# Patient Record
Sex: Male | Born: 1955
Health system: Southern US, Community
[De-identification: ages and names within clinical notes are randomized; demographics above are authoritative.]

## PROBLEM LIST (undated history)

## (undated) DIAGNOSIS — J310 Chronic rhinitis: Secondary | ICD-10-CM

## (undated) DIAGNOSIS — N35919 Unspecified urethral stricture, male, unspecified site: Secondary | ICD-10-CM

## (undated) DIAGNOSIS — K219 Gastro-esophageal reflux disease without esophagitis: Secondary | ICD-10-CM

## (undated) DIAGNOSIS — E349 Endocrine disorder, unspecified: Secondary | ICD-10-CM

## (undated) DIAGNOSIS — R51 Headache: Secondary | ICD-10-CM

## (undated) DIAGNOSIS — N400 Enlarged prostate without lower urinary tract symptoms: Secondary | ICD-10-CM

## (undated) DIAGNOSIS — R0602 Shortness of breath: Secondary | ICD-10-CM

## (undated) DIAGNOSIS — I10 Essential (primary) hypertension: Secondary | ICD-10-CM

## (undated) DIAGNOSIS — N529 Male erectile dysfunction, unspecified: Secondary | ICD-10-CM

## (undated) DIAGNOSIS — T7840XA Allergy, unspecified, initial encounter: Secondary | ICD-10-CM

## (undated) DIAGNOSIS — G4733 Obstructive sleep apnea (adult) (pediatric): Secondary | ICD-10-CM

## (undated) DIAGNOSIS — J45909 Unspecified asthma, uncomplicated: Secondary | ICD-10-CM

## (undated) DIAGNOSIS — G4730 Sleep apnea, unspecified: Secondary | ICD-10-CM

## (undated) DIAGNOSIS — G709 Myoneural disorder, unspecified: Secondary | ICD-10-CM

## (undated) DIAGNOSIS — E785 Hyperlipidemia, unspecified: Secondary | ICD-10-CM

## (undated) DIAGNOSIS — M1990 Unspecified osteoarthritis, unspecified site: Secondary | ICD-10-CM

## (undated) DIAGNOSIS — Q318 Other congenital malformations of larynx: Secondary | ICD-10-CM

## (undated) HISTORY — PX: COLONOSCOPY: SHX174

## (undated) HISTORY — DX: Chronic rhinitis: J31.0

## (undated) HISTORY — DX: Allergy, unspecified, initial encounter: T78.40XA

## (undated) HISTORY — PX: CARPAL TUNNEL RELEASE: SHX101

## (undated) HISTORY — DX: Shortness of breath: R06.02

## (undated) HISTORY — DX: Hyperlipidemia, unspecified: E78.5

## (undated) HISTORY — DX: Endocrine disorder, unspecified: E34.9

## (undated) HISTORY — PX: POLYPECTOMY: SHX149

## (undated) HISTORY — DX: Sleep apnea, unspecified: G47.30

## (undated) HISTORY — PX: UPPER GASTROINTESTINAL ENDOSCOPY: SHX188

## (undated) HISTORY — DX: Male erectile dysfunction, unspecified: N52.9

## (undated) HISTORY — PX: BACK SURGERY: SHX140

## (undated) HISTORY — DX: Myoneural disorder, unspecified: G70.9

## (undated) HISTORY — DX: Essential (primary) hypertension: I10

---

## 1999-07-12 ENCOUNTER — Emergency Department (HOSPITAL_COMMUNITY): Admission: EM | Admit: 1999-07-12 | Discharge: 1999-07-12 | Payer: Self-pay | Admitting: Emergency Medicine

## 2001-12-25 ENCOUNTER — Encounter: Payer: Self-pay | Admitting: Family Medicine

## 2001-12-25 ENCOUNTER — Ambulatory Visit (HOSPITAL_COMMUNITY): Admission: RE | Admit: 2001-12-25 | Discharge: 2001-12-25 | Payer: Self-pay | Admitting: Family Medicine

## 2002-01-01 ENCOUNTER — Ambulatory Visit (HOSPITAL_COMMUNITY): Admission: RE | Admit: 2002-01-01 | Discharge: 2002-01-01 | Payer: Self-pay | Admitting: Family Medicine

## 2002-01-01 ENCOUNTER — Encounter: Payer: Self-pay | Admitting: Family Medicine

## 2002-07-26 ENCOUNTER — Ambulatory Visit (HOSPITAL_COMMUNITY): Admission: RE | Admit: 2002-07-26 | Discharge: 2002-07-26 | Payer: Self-pay | Admitting: Family Medicine

## 2002-07-26 ENCOUNTER — Encounter: Payer: Self-pay | Admitting: Family Medicine

## 2002-08-28 ENCOUNTER — Ambulatory Visit (HOSPITAL_BASED_OUTPATIENT_CLINIC_OR_DEPARTMENT_OTHER): Admission: RE | Admit: 2002-08-28 | Discharge: 2002-08-28 | Payer: Self-pay | Admitting: Orthopedic Surgery

## 2002-08-28 HISTORY — PX: SHOULDER ARTHROSCOPY W/ ACROMIAL REPAIR: SUR94

## 2003-03-01 ENCOUNTER — Encounter: Payer: Self-pay | Admitting: Emergency Medicine

## 2003-03-01 ENCOUNTER — Emergency Department (HOSPITAL_COMMUNITY): Admission: EM | Admit: 2003-03-01 | Discharge: 2003-03-01 | Payer: Self-pay | Admitting: Emergency Medicine

## 2006-10-07 ENCOUNTER — Emergency Department (HOSPITAL_COMMUNITY): Admission: EM | Admit: 2006-10-07 | Discharge: 2006-10-07 | Payer: Self-pay | Admitting: Emergency Medicine

## 2007-06-28 LAB — HM COLONOSCOPY

## 2008-09-23 ENCOUNTER — Encounter: Admission: RE | Admit: 2008-09-23 | Discharge: 2008-09-23 | Payer: Self-pay | Admitting: Sports Medicine

## 2009-01-27 ENCOUNTER — Encounter: Payer: Self-pay | Admitting: Pulmonary Disease

## 2009-01-27 ENCOUNTER — Ambulatory Visit: Payer: Self-pay | Admitting: Pulmonary Disease

## 2009-01-27 DIAGNOSIS — E785 Hyperlipidemia, unspecified: Secondary | ICD-10-CM | POA: Insufficient documentation

## 2009-01-27 DIAGNOSIS — K219 Gastro-esophageal reflux disease without esophagitis: Secondary | ICD-10-CM | POA: Insufficient documentation

## 2009-01-27 DIAGNOSIS — J310 Chronic rhinitis: Secondary | ICD-10-CM | POA: Insufficient documentation

## 2009-01-27 DIAGNOSIS — R0683 Snoring: Secondary | ICD-10-CM | POA: Insufficient documentation

## 2009-01-27 DIAGNOSIS — I10 Essential (primary) hypertension: Secondary | ICD-10-CM | POA: Insufficient documentation

## 2009-01-27 DIAGNOSIS — J45909 Unspecified asthma, uncomplicated: Secondary | ICD-10-CM | POA: Insufficient documentation

## 2009-01-27 HISTORY — DX: Hyperlipidemia, unspecified: E78.5

## 2009-01-27 HISTORY — DX: Chronic rhinitis: J31.0

## 2009-01-27 HISTORY — DX: Essential (primary) hypertension: I10

## 2009-01-28 ENCOUNTER — Encounter: Payer: Self-pay | Admitting: Pulmonary Disease

## 2009-02-19 ENCOUNTER — Encounter: Payer: Self-pay | Admitting: Pulmonary Disease

## 2009-02-19 ENCOUNTER — Ambulatory Visit: Payer: Self-pay | Admitting: Pulmonary Disease

## 2009-02-19 DIAGNOSIS — J383 Other diseases of vocal cords: Secondary | ICD-10-CM | POA: Insufficient documentation

## 2009-02-20 ENCOUNTER — Encounter: Payer: Self-pay | Admitting: Pulmonary Disease

## 2009-08-25 ENCOUNTER — Ambulatory Visit: Payer: Self-pay | Admitting: Internal Medicine

## 2009-08-25 DIAGNOSIS — R519 Headache, unspecified: Secondary | ICD-10-CM | POA: Insufficient documentation

## 2009-08-25 DIAGNOSIS — R51 Headache: Secondary | ICD-10-CM | POA: Insufficient documentation

## 2009-08-26 ENCOUNTER — Encounter: Payer: Self-pay | Admitting: Internal Medicine

## 2009-08-27 ENCOUNTER — Telehealth: Payer: Self-pay | Admitting: Internal Medicine

## 2009-09-03 ENCOUNTER — Encounter: Payer: Self-pay | Admitting: Internal Medicine

## 2009-11-11 ENCOUNTER — Ambulatory Visit: Payer: Self-pay | Admitting: Internal Medicine

## 2010-01-05 ENCOUNTER — Ambulatory Visit: Payer: Self-pay | Admitting: Internal Medicine

## 2010-01-05 DIAGNOSIS — IMO0002 Reserved for concepts with insufficient information to code with codable children: Secondary | ICD-10-CM | POA: Insufficient documentation

## 2010-01-06 ENCOUNTER — Encounter: Payer: Self-pay | Admitting: Internal Medicine

## 2010-01-06 ENCOUNTER — Telehealth: Payer: Self-pay | Admitting: Internal Medicine

## 2010-01-26 ENCOUNTER — Encounter: Payer: Self-pay | Admitting: Internal Medicine

## 2010-02-05 ENCOUNTER — Emergency Department (HOSPITAL_COMMUNITY): Admission: EM | Admit: 2010-02-05 | Discharge: 2010-02-05 | Payer: Self-pay | Admitting: Emergency Medicine

## 2010-04-27 ENCOUNTER — Ambulatory Visit: Payer: Self-pay | Admitting: Internal Medicine

## 2010-04-27 DIAGNOSIS — R358 Other polyuria: Secondary | ICD-10-CM

## 2010-04-27 DIAGNOSIS — R319 Hematuria, unspecified: Secondary | ICD-10-CM | POA: Insufficient documentation

## 2010-04-27 DIAGNOSIS — R3589 Other polyuria: Secondary | ICD-10-CM | POA: Insufficient documentation

## 2010-04-27 DIAGNOSIS — N342 Other urethritis: Secondary | ICD-10-CM | POA: Insufficient documentation

## 2010-05-28 ENCOUNTER — Telehealth: Payer: Self-pay | Admitting: Internal Medicine

## 2010-05-31 ENCOUNTER — Ambulatory Visit: Payer: Self-pay | Admitting: Internal Medicine

## 2010-05-31 LAB — CONVERTED CEMR LAB: PSA: 2.91 ng/mL (ref 0.10–4.00)

## 2010-06-03 ENCOUNTER — Telehealth: Payer: Self-pay | Admitting: Internal Medicine

## 2010-06-16 ENCOUNTER — Ambulatory Visit: Payer: Self-pay | Admitting: Internal Medicine

## 2010-07-15 ENCOUNTER — Telehealth: Payer: Self-pay | Admitting: Internal Medicine

## 2010-07-16 ENCOUNTER — Ambulatory Visit
Admission: RE | Admit: 2010-07-16 | Discharge: 2010-07-16 | Payer: Self-pay | Source: Home / Self Care | Attending: Internal Medicine | Admitting: Internal Medicine

## 2010-07-16 DIAGNOSIS — K1120 Sialoadenitis, unspecified: Secondary | ICD-10-CM | POA: Insufficient documentation

## 2010-07-27 NOTE — Medication Information (Signed)
Summary: Prior Authorization Request for Cialis-Duplicate  Prior Authorization Request for Cialis-Duplicate   Imported By: Maryln Gottron 09/09/2009 10:43:17  _____________________________________________________________________  External Attachment:    Type:   Image     Comment:   External Document

## 2010-07-27 NOTE — Progress Notes (Signed)
Summary: FYI  Phone Note Call from Patient   Caller: Patient Call For: Gordy Savers  MD Summary of Call: [phone] Pt is concerned that the swelling in his penis is no better, and still painful.  Ice is not helping. Initial call taken by: Lynann Beaver CMA,  January 06, 2010 9:55 AM  Follow-up for Phone Call        called and left message Follow-up by: Gordy Savers  MD,  January 06, 2010 12:25 PM

## 2010-07-27 NOTE — Miscellaneous (Signed)
Summary: Pulmonary function test   Pulmonary Function Test Date: 02/19/2009 Height (in.): 67 Gender: Male  Pre-Spirometry FVC    Value: 3.64 L/min   Pred: 4.35 L/min     % Pred: 84 % FEV1    Value: 2.53 L     Pred: 3.21 L     % Pred: 79 % FEV1/FVC  Value: 69 %     Pred: 73 %     % Pred: . % FEF 25-75  Value: 1.49 L/min   Pred: 3.28 L/min     % Pred: 46 %  Post-Spirometry FVC    Value: 4.13 L/min   Pred: 4.35 L/min     % Pred: 95 % FEV1    Value: 2.90 L     Pred: 3.21 L     % Pred: 90 % FEV1/FVC  Value: 70 %     Pred: 73 %     % Pred: . % FEF 25-75  Value: 1.87 L/min   Pred: 3.28 L/min     % Pred: 57 %  Lung Volumes TLC    Value: 5.62 L   % Pred: 93 % RV    Value: 1.98 L   % Pred: 97 % DLCO    Value: 27.4 %   % Pred: 99 % DLCO/VA  Value: 5.67 %   % Pred: 144 %  Comments: Borderline obstruction.  Positive bronchodilator response.  Normal lung volumes.  Normal diffusion. Clinical Lists Changes  Observations: Added new observation of PFT COMMENTS: Borderline obstruction.  Positive bronchodilator response.  Normal lung volumes.  Normal diffusion. (02/19/2009 8:54) Added new observation of DLCO/VA%EXP: 144 % (02/19/2009 8:54) Added new observation of DLCO/VA: 5.67 % (02/19/2009 8:54) Added new observation of DLCO % EXPEC: 99 % (02/19/2009 8:54) Added new observation of DLCO: 27.4 % (02/19/2009 8:54) Added new observation of RV % EXPECT: 97 % (02/19/2009 8:54) Added new observation of RV: 1.98 L (02/19/2009 8:54) Added new observation of TLC % EXPECT: 93 % (02/19/2009 8:54) Added new observation of TLC: 5.62 L (02/19/2009 8:54) Added new observation of FEF2575%EXPS: 57 % (02/19/2009 8:54) Added new observation of PSTFEF25/75P: 3.28  (02/19/2009 8:54) Added new observation of PSTFEF25/75%: 1.87 L/min (02/19/2009 8:54) Added new observation of PSTFEV1/FCV%: . % (02/19/2009 8:54) Added new observation of FEV1FVCPRDPS: 73 % (02/19/2009 8:54) Added new observation of PSTFEV1/FVC: 70  % (02/19/2009 8:54) Added new observation of POSTFEV1%PRD: 90 % (02/19/2009 8:54) Added new observation of FEV1PRDPST: 3.21 L (02/19/2009 8:54) Added new observation of POST FEV1: 2.90 L/min (02/19/2009 8:54) Added new observation of POST FVC%EXP: 95 % (02/19/2009 8:54) Added new observation of FVCPRDPST: 4.35 L/min (02/19/2009 8:54) Added new observation of POST FVC: 4.13 L (02/19/2009 8:54) Added new observation of FEF % EXPEC: 46 % (02/19/2009 8:54) Added new observation of FEF25-75%PRE: 3.28 L/min (02/19/2009 8:54) Added new observation of FEF 25-75%: 1.49 L/min (02/19/2009 8:54) Added new observation of FEV1/FVC%EXP: . % (02/19/2009 8:54) Added new observation of FEV1/FVC PRE: 73 % (02/19/2009 8:54) Added new observation of FEV1/FVC: 69 % (02/19/2009 8:54) Added new observation of FEV1 % EXP: 79 % (02/19/2009 8:54) Added new observation of FEV1 PREDICT: 3.21 L (02/19/2009 8:54) Added new observation of FEV1: 2.53 L (02/19/2009 8:54) Added new observation of FVC % EXPECT: 84 % (02/19/2009 8:54) Added new observation of FVC PREDICT: 4.35 L (02/19/2009 8:54) Added new observation of FVC: 3.64 L (02/19/2009 8:54) Added new observation of PFT DATE: 02/19/2009  (02/19/2009 8:54)

## 2010-07-27 NOTE — Progress Notes (Signed)
Summary: Pt called with the psa #'s he had drawn  Phone Note Call from Patient Call back at Work Phone [phone]   Caller: Patient Summary of Call: Pt is trying to re-apply  for life insurane. Pt called with psa # 4.49 and 3 psa was 23.  This is why  he was denied life insurance. Pt would like to sch psa test.    Initial call taken by: Lucy Antigua,  May 28, 2010 10:40 AM  Follow-up for Phone Call        please advise - ok to schedule just lab ? rov pedning lab results? Follow-up by: Duard Brady LPN,  May 28, 2010 11:02 AM  Additional Follow-up for Phone Call Additional follow up Details #1::        OK to do just PSA    Additional Follow-up for Phone Call Additional follow up Details #2::    spoke with pt - lab scheduled for monday. KIK Follow-up by: Duard Brady LPN,  May 28, 2010 12:54 PM

## 2010-07-27 NOTE — Assessment & Plan Note (Signed)
Summary: to discuss issues/njr   Vital Signs:  Patient profile:   55 year old male Weight:      208 pounds Temp:     98.2 degrees F oral BP sitting:   110 / 70  (right arm) Cuff size:   regular  Vitals Entered By: Duard Brady LPN (Nov 11, 2009 10:04 AM) CC: has questions about injections to back , and reoccurring hemorrhoid problem Is Patient Diabetic? No   Primary Care Provider:  [facility]  CC:  has questions about injections to back  and and reoccurring hemorrhoid problem.  History of Present Illness: 55 year old patient who is seen today for follow-up.  He has a history of mild asthma, hypertension, and dyslipidemia.  He is also followed at [facility] hospital system.  He has been receiving epidurals for back pain.  He also has symptomatic external hemorrhoids.  He will be seen in follow-up at the [place] next month and will discuss referral for this at that time.  He may be calling this  office for a local referral.  Preventive Screening-Counseling & Management  Alcohol-Tobacco     Smoking Status: quit  Allergies: 1)  ! Jonne Ply  Past History:  Past Medical History: Reviewed history from 08/25/2009 and no changes required. Asthma with borderline obstruction      - PFT 02/19/09 FEV1 2.90(90%), FVC 4.13(95%), FEV1% 70, TLC 5.62(93%), DLCO 99%, +BD Rhinitis Obstructive sleep apnea [facility] 2009)      - Not on therapy Hypertension Hyperlipidemia GERD Low back pain Headache (basilar migraines) ED testosterone deficiency  Past Surgical History: Reviewed history from 08/25/2009 and no changes required. Left shoulder arthroscopy (twice) colonoscopy  2009 [facility])  Review of Systems       The patient complains of weight loss.  The patient denies anorexia, fever, weight gain, vision loss, decreased hearing, hoarseness, chest pain, syncope, dyspnea on exertion, peripheral edema, prolonged cough, headaches, hemoptysis, abdominal pain, melena, hematochezia,  severe indigestion/heartburn, hematuria, incontinence, genital sores, muscle weakness, suspicious skin lesions, transient blindness, difficulty walking, depression, unusual weight change, abnormal bleeding, enlarged lymph nodes, angioedema, breast masses, and testicular masses.    Physical Exam  General:  overweight-appearing.  110/70overweight-appearing.   Head:  Normocephalic and atraumatic without obvious abnormalities. No apparent alopecia or balding. Mouth:  Oral mucosa and oropharynx without lesions or exudates.  Teeth in good repair. Neck:  No deformities, masses, or tenderness noted. Lungs:  Normal respiratory effort, chest expands symmetrically. Lungs are clear to auscultation, no crackles or wheezes. Heart:  Normal rate and regular rhythm. S1 and S2 normal without gallop, murmur, click, rub or other extra sounds. Abdomen:  Bowel sounds positive,abdomen soft and non-tender without masses, organomegaly or hernias noted.   Impression & Recommendations:  Problem # 1:  GERD (ICD-530.81)  His updated medication list for this problem includes:    Omeprazole 20 Mg Tbec (Omeprazole) .Marland Kitchen... 1 by mouth daily  His updated medication list for this problem includes:    Omeprazole 20 Mg Tbec (Omeprazole) .Marland Kitchen... 1 by mouth daily  Problem # 2:  ASTHMA (ICD-493.90)  His updated medication list for this problem includes:    Qvar 80 Mcg/act Aers (Beclomethasone dipropionate) .Marland Kitchen..Marland Kitchen Two puffs two times a day    Proair Hfa 108 (90 Base) Mcg/act Aers (Albuterol sulfate) .Marland Kitchen..Marland Kitchen Two puffs qid as needed  His updated medication list for this problem includes:    Qvar 80 Mcg/act Aers (Beclomethasone dipropionate) .Marland Kitchen..Marland Kitchen Two puffs two times a day  Proair Hfa 108 (90 Base) Mcg/act Aers (Albuterol sulfate) .Marland Kitchen..Marland Kitchen Two puffs qid as needed  Problem # 3:  HYPERTENSION (ICD-401.9)  His updated medication list for this problem includes:    Atenolol 25 Mg Tabs (Atenolol) .Marland Kitchen... 1 by mouth daily    His updated  medication list for this problem includes:    Atenolol 25 Mg Tabs (Atenolol) .Marland Kitchen... 1 by mouth daily  Orders: Prescription Created Electronically [phone])  Problem # 4:  HYPERLIPIDEMIA (ICD-272.4)  His updated medication list for this problem includes:    Simvastatin 80 Mg Tabs (Simvastatin) .Marland Kitchen... 1 by mouth daily    His updated medication list for this problem includes:    Simvastatin 80 Mg Tabs (Simvastatin) .Marland Kitchen... 1 by mouth daily  Orders: Prescription Created Electronically [phone])  Complete Medication List: 1)  Qvar 80 Mcg/act Aers (Beclomethasone dipropionate) .... Two puffs two times a day 2)  Proair Hfa 108 (90 Base) Mcg/act Aers (Albuterol sulfate) .... Two puffs qid as needed 3)  Nasonex 50 Mcg/act Susp (Mometasone furoate) .... Two sprays once daily 4)  Omeprazole 20 Mg Tbec (Omeprazole) .Marland Kitchen.. 1 by mouth daily 5)  Simvastatin 80 Mg Tabs (Simvastatin) .Marland Kitchen.. 1 by mouth daily 6)  Atenolol 25 Mg Tabs (Atenolol) .Marland Kitchen.. 1 by mouth daily 7)  Diclofenac Sodium 75 Mg Tbec (Diclofenac sodium) .Marland Kitchen.. 1 by mouth two times a day 8)  Levitra 20 Mg Tabs (Vardenafil hcl) .... Uad  Patient Instructions: 1)  Please schedule a follow-up appointment in 6 months. 2)  Limit your Sodium (Salt) to less than 2 grams a day(slightly less than 1/2 a teaspoon) to prevent fluid retention, swelling, or worsening of symptoms. 3)  Avoid foods high in acid (tomatoes, citrus juices, spicy foods). Avoid eating within two hours of lying down or before exercising. Do not over eat; try smaller more frequent meals. Elevate head of bed twelve inches when sleeping. 4)  It is important that you exercise regularly at least 20 minutes 5 times a week. If you develop chest pain, have severe difficulty breathing, or feel very tired , stop exercising immediately and seek medical attention. 5)  You need to lose weight. Consider a lower calorie diet and regular exercise.  Prescriptions: DICLOFENAC SODIUM 75 MG TBEC (DICLOFENAC  SODIUM) 1 by mouth two times a day  #180 x 6   Entered and Authorized by:   Gordy Savers  MD   Signed by:   Gordy Savers  MD on 11/11/2009   Method used:   [facility] to        [facility]  [address] [phone][facility] (retail)       [address]       [place], [place]  [address]       Ph: [phone] or [phone]       Fax: [phone]   RxID:   [id number] ATENOLOL 25 MG TABS (ATENOLOL) 1 by mouth daily  #90 x 6   Entered and Authorized by:   Gordy Savers  MD   Signed by:   Gordy Savers  MD on 11/11/2009   Method used:   [facility]        [facility]  [address] [phone]* (retail)       [address]       [place], [place]  [address]       Ph: [phone] or [phone]  Fax: [phone]   RxID:   [id number] SIMVASTATIN 80 MG TABS (SIMVASTATIN) 1 by mouth daily  #90 x 6   Entered and Authorized by:   Gordy Savers  MD   Signed by:   Gordy Savers  MD on 11/11/2009   Method used:   [facility]  [address] [phone]* (retail)       [address]       [place], [place]  [address]       Ph: [phone] or [phone]       Fax: [phone]   RxID:   [id number]

## 2010-07-27 NOTE — Assessment & Plan Note (Signed)
Summary: FREQUENCY AGAIN/PS   Vital Signs:  Patient profile:   55 year old male Height:      66.5 inches Weight:      210 pounds BMI:     33.51 Temp:     98.8 degrees F oral BP sitting:   118 / 80  (left arm) Cuff size:   regular  Vitals Entered By: Kern Reap CMA Duncan Dull) (April 27, 2010 8:24 AM) L. heCC: frequent urination   [facility] Care Provider:  [facility]  CC:  frequent urination.  History of Present Illness:  55 year old patient noticed some gross hematuria after sexual intercourse with his wife 4 days ago.  The gross hematuria resolved the  next morning.  He has had persistent frequency and urgency, but no urethral discharge.  He denies any fever.  He does treated hypertension and dyslipidemia.  He is also followed the [facility].  He has a history of asthma, which has been stable.  Allergies: 1)  ! Asa  Review of Systems  The patient denies anorexia, fever, weight loss, weight gain, vision loss, decreased hearing, hoarseness, chest pain, syncope, dyspnea on exertion, peripheral edema, prolonged cough, headaches, hemoptysis, abdominal pain, melena, hematochezia, severe indigestion/heartburn, hematuria, incontinence, genital sores, muscle weakness, suspicious skin lesions, transient blindness, difficulty walking, depression, unusual weight change, abnormal bleeding, enlarged lymph nodes, angioedema, breast masses, and testicular masses.    Physical Exam  General:  Well-developed,well-nourished,in no acute distress; alert,appropriate and cooperative throughout examination Head:  Normocephalic and atraumatic without obvious abnormalities. No apparent alopecia or balding. Eyes:  No corneal or conjunctival inflammation noted. EOMI. Perrla. Funduscopic exam benign, without hemorrhages, exudates or papilledema. Vision grossly normal. Mouth:  Oral mucosa and oropharynx without lesions or exudates.  Teeth in good repair. Neck:  No deformities, masses, or tenderness  noted. Lungs:  Normal respiratory effort, chest expands symmetrically. Lungs are clear to auscultation, no crackles or wheezes. Heart:  Normal rate and regular rhythm. S1 and S2 normal without gallop, murmur, click, rub or other extra sounds. Genitalia:  Testes bilaterally descended without nodularity, tenderness or masses. No scrotal masses or lesions. No penis lesions or urethral discharge.   Impression & Recommendations:  Problem # 1:  UNSPECIFIED URETHRITIS (ICD-597.80)  Problem # 2:  HYPERTENSION (ICD-401.9)  His updated medication list for this problem includes:    Atenolol 25 Mg Tabs (Atenolol) .Marland Kitchen... 1 by mouth daily  His updated medication list for this problem includes:    Atenolol 25 Mg Tabs (Atenolol) .Marland Kitchen... 1 by mouth daily  Complete Medication List: 1)  Qvar 80 Mcg/act Aers (Beclomethasone dipropionate) .... Two puffs two times a day 2)  Proair Hfa 108 (90 Base) Mcg/act Aers (Albuterol sulfate) .... Two puffs qid as needed 3)  Nasonex 50 Mcg/act Susp (Mometasone furoate) .... Two sprays once daily 4)  Omeprazole 20 Mg Tbec (Omeprazole) .Marland Kitchen.. 1 by mouth daily 5)  Simvastatin 80 Mg Tabs (Simvastatin) .Marland Kitchen.. 1 by mouth daily 6)  Atenolol 25 Mg Tabs (Atenolol) .Marland Kitchen.. 1 by mouth daily 7)  Diclofenac Sodium 75 Mg Tbec (Diclofenac sodium) .Marland Kitchen.. 1 by mouth two times a day 8)  Levitra 20 Mg Tabs (Vardenafil hcl) .... Uad 9)  Ciprofloxacin Hcl 500 Mg Tabs (Ciprofloxacin hcl) .... One twice daily  Patient Instructions: 1)  Please schedule a follow-up appointment in 6 months. 2)  Limit your Sodium (Salt). 3)  It is important that you exercise regularly at least 20 minutes 5 times a week. If you develop chest  pain, have severe difficulty breathing, or feel very tired , stop exercising immediately and seek medical attention. 4)  You need to lose weight. Consider a lower calorie diet and regular exercise.  Prescriptions: CIPROFLOXACIN HCL 500 MG TABS (CIPROFLOXACIN HCL) one twice daily  #14  x 0   Entered and Authorized by:   Gordy Savers  MD   Signed by:   Gordy Savers  MD on 04/27/2010   Method used:   Print then Give to Patient   RxID:   [id number]    Orders Added: 1)  Est. Patient Level III [95621]  Appended Document: Orders Update    Clinical Lists Changes  Problems: Added new problem of POLYURIA (HYQ-657.84) Added new problem of HEMATURIA UNSPECIFIED (ICD-599.70) Orders: Added new Service order of UA Dipstick w/o Micro (manual) (69629) - Signed Observations: Added new observation of PH URINE: 6.5  (04/27/2010 14:15) Added new observation of SPEC GR URIN: 1.010  (04/27/2010 14:15) Added new observation of APPEARANCE U: Hazy  (04/27/2010 14:15) Added new observation of UA COLOR: yellow  (04/27/2010 14:15) Added new observation of NITRITE URN: negative  (04/27/2010 14:15) Added new observation of UROBILINOGEN: 0.2  (04/27/2010 14:15) Added new observation of PROTEIN, URN: negative  (04/27/2010 14:15) Added new observation of KETONES URN: negative  (04/27/2010 14:15) Added new observation of BILIRUBIN UR: negative  (04/27/2010 14:15) Added new observation of GLUCOSE, URN: negative  (04/27/2010 14:15) Added new observation of BLOOD UR DIP: small  (04/27/2010 14:15) Added new observation of WBC DIPSTK U: small  (04/27/2010 14:15) Added new observation of COMMENTS: Kern Reap CMA Duncan Dull)  April 27, 2010 2:16 PM   (04/27/2010 14:15)      Laboratory Results   Urine Tests  Date/Time Received: April 27, 2010   Routine Urinalysis   Color: yellow Appearance: Hazy Glucose: negative   (Normal Range: Negative) Bilirubin: negative   (Normal Range: Negative) Ketone: negative   (Normal Range: Negative) Spec. Gravity: 1.010   (Normal Range: 1.003-1.035) Blood: small   (Normal Range: Negative) pH: 6.5   (Normal Range: 5.0-8.0) Protein: negative   (Normal Range: Negative) Urobilinogen: 0.2   (Normal Range: 0-1) Nitrite: negative    (Normal Range: Negative) Leukocyte Esterace: small   (Normal Range: Negative)    Comments: Kern Reap CMA (AAMA)  April 27, 2010 2:16 PM

## 2010-07-27 NOTE — Miscellaneous (Signed)
Summary: Orders Update  Clinical Lists Changes  Orders: Added new Referral order of Urology Referral (Urology) - Signed 

## 2010-07-27 NOTE — Assessment & Plan Note (Signed)
Summary: injury to penis/dm   Vital Signs:  Patient profile:   55 year old male Weight:      205 pounds Temp:     98.3 degrees F oral BP sitting:   112 / 70  (right arm) Cuff size:   regular  Vitals Entered By: Duard Brady LPN (January 05, 2010 9:11 AM) CC: c/o injury to groin area - swelling Is Patient Diabetic? No   Primary Care Provider:  [facility]  CC:  c/o injury to groin area - swelling.  History of Present Illness: 55 year old patient who is seen today after trauma earlier this morning to the groin area.  He was moving a table top when he tripped over his dogs, sustaining trauma to the groin region.  He has had  considerable penile edema and bruising.  He was able to void in the office without difficulty and had no dysuria or hematuria.  Allergies: 1)  ! Jonne Ply  Past History:  Past Medical History: Reviewed history from 08/25/2009 and no changes required. Asthma with borderline obstruction      - PFT 02/19/09 FEV1 2.90(90%), FVC 4.13(95%), FEV1% 70, TLC 5.62(93%), DLCO 99%, +BD Rhinitis Obstructive sleep apnea [facility] 2009)      - Not on therapy Hypertension Hyperlipidemia GERD Low back pain Headache (basilar migraines) ED testosterone deficiency  Review of Systems  The patient denies anorexia, fever, weight loss, weight gain, vision loss, decreased hearing, hoarseness, chest pain, syncope, dyspnea on exertion, peripheral edema, prolonged cough, headaches, hemoptysis, abdominal pain, melena, hematochezia, severe indigestion/heartburn, hematuria, incontinence, genital sores, muscle weakness, suspicious skin lesions, transient blindness, difficulty walking, depression, unusual weight change, abnormal bleeding, enlarged lymph nodes, angioedema, breast masses, and testicular masses.    Physical Exam  General:  comfortable no acute distress.  Blood pressure stable Abdomen:  Bowel sounds positive,abdomen soft and non-tender without masses,  organomegaly or hernias noted. Genitalia:  considerable penile edema with some early bruising slight tenderness over the lower abdomen near the base of the penis   Impression & Recommendations:  Problem # 1:  CONTUSION OF GENITAL ORGANS (ICD-922.4)  Complete Medication List: 1)  Qvar 80 Mcg/act Aers (Beclomethasone dipropionate) .... Two puffs two times a day 2)  Proair Hfa 108 (90 Base) Mcg/act Aers (Albuterol sulfate) .... Two puffs qid as needed 3)  Nasonex 50 Mcg/act Susp (Mometasone furoate) .... Two sprays once daily 4)  Omeprazole 20 Mg Tbec (Omeprazole) .Marland Kitchen.. 1 by mouth daily 5)  Simvastatin 80 Mg Tabs (Simvastatin) .Marland Kitchen.. 1 by mouth daily 6)  Atenolol 25 Mg Tabs (Atenolol) .Marland Kitchen.. 1 by mouth daily 7)  Diclofenac Sodium 75 Mg Tbec (Diclofenac sodium) .Marland Kitchen.. 1 by mouth two times a day 8)  Levitra 20 Mg Tabs (Vardenafil hcl) .... Uad  Patient Instructions: 1)  apply ice to the groin region for the next 24 hours 2)  Take 400-600mg  of Ibuprofen (Advil, Motrin) with food every 4-6 hours as needed for relief of pain or comfort of fever. 3)  call immediately if there is any voiding difficulties

## 2010-07-27 NOTE — Letter (Signed)
Summary: [facility]   Imported By: Maryln Gottron 02/01/2010 13:37:58  _____________________________________________________________________  External Attachment:    Type:   Image     Comment:   External Document

## 2010-07-27 NOTE — Progress Notes (Signed)
Summary: Cialis denied  Phone Note Other Incoming   Summary of Call: Cialis denied- try Levitra first Initial call taken by: Raechel Ache, RN,  August 27, 2009 11:31 AM    New/Updated Medications: LEVITRA 20 MG TABS (VARDENAFIL HCL) UAD Prescriptions: LEVITRA 20 MG TABS (VARDENAFIL HCL) UAD  #6 x 0   Entered by:   Raechel Ache, RN   Authorized by:   Gordy Savers  MD   Signed by:   Raechel Ache, RN on 08/27/2009   Method used:   [facility]  [address] [phone][facility] ([facility]       [address]       [place], [place]  [id number]       Ph: [phone] or [phone]       Fax: [phone]   RxID:   [id number]

## 2010-07-27 NOTE — Progress Notes (Signed)
  Phone Note Call from Patient Call back at Work Phone [phone]   Caller: Patient Call For: Gordy Savers  MD Summary of Call: Needs lab results. Initial call taken by: Lynann Beaver CMA AAMA,  June 03, 2010 12:25 PM  Follow-up for Phone Call        PSA normal Follow-up by: Gordy Savers  MD,  June 03, 2010 12:57 PM  Additional Follow-up for Phone Call Additional follow up Details #1::        given pt results. Additional Follow-up by: Lynann Beaver CMA AAMA,  June 03, 2010 3:26 PM

## 2010-07-27 NOTE — Assessment & Plan Note (Signed)
Summary: F/U with PFT/LC   Primary Provider/Referring Provider:  [facility]  CC:  follow-up with PFT's.    No changes..  History of Present Illness: I saw Douglas Carpenter for follow up of his wheezing, GERD, Rhinitis, and OSA.  His wheeze has improved with inhaler therapy, and augmentation in his sinus regimen.  He feels that proair has helped his breathing.  He has also been sleeping better since his wheezing has improved.  He did not realize how bad he was feeling until he started to improve.  PFT from February 19, 2009 showed borderline obstruction with positive bronchodilator response.  Current Medications (verified): 1)  Omeprazole 20 Mg Tbec (Omeprazole) .Marland Kitchen.. 1 By Mouth Daily 2)  Simvastatin 80 Mg Tabs (Simvastatin) .Marland Kitchen.. 1 By Mouth Daily 3)  Atenolol 25 Mg Tabs (Atenolol) .Marland Kitchen.. 1 By Mouth Daily 4)  Diclofenac Sodium 75 Mg Tbec (Diclofenac Sodium) .Marland Kitchen.. 1 By Mouth Two Times A Day 5)  Nasonex 50 Mcg/act Susp (Mometasone Furoate) .... Two Sprays Once Daily 6)  Proair Hfa 108 (90 Base) Mcg/act Aers (Albuterol Sulfate) .... Two Puffs Qid As Needed  Allergies (verified): 1)  ! Jonne Ply  Past History:  Family History: Last updated: 01/27/2009 Heart disease--MGF. MGM Family History Colon Cancer--MGF Family History Lung Cancer--mother, MGF Family History Prostate Cancer--MGF  Social History: Last updated: 01/27/2009 Married Retired Insurance underwriter Former smoker  Past Medical History: Asthma with borderline obstruction      - PFT 02/19/09 FEV1 2.90(90%), FVC 4.13(95%), FEV1% 70, TLC 5.62(93%), DLCO 99%, +BD Rhinitis Obstructive sleep apnea      - Not on therapy Hypertension Hyperlipidemia GERD Low back pain  Past Surgical History: Reviewed history from 01/27/2009 and no changes required. Left shoulder arthroscopy  Family History: Reviewed history from 01/27/2009 and no changes required. Heart disease--MGF. MGM Family History Colon Cancer--MGF Family History Lung  Cancer--mother, MGF Family History Prostate Cancer--MGF  Social History: Reviewed history from 01/27/2009 and no changes required. Married Retired Insurance underwriter Former smoker  Vital Signs:  Patient profile:   55 year old male Height:      67 inches (170.18 cm) Weight:      237 pounds (107.73 kg) BMI:     37.25 O2 Sat:      94 % on Room air Temp:     98.4 degrees F (36.89 degrees C) oral Pulse rate:   89 / minute BP sitting:   110 / 74  (right arm) Cuff size:   large  O2 Flow:  Room air  Physical Exam  General:  normal appearance, healthy appearing, and obese.   Nose:  no deformity, discharge, inflammation, or lesions Mouth:  MP 3, enlarged tongue, no exudate Neck:  no JVD, no thyromegaly, no wheeze heard over throat Lungs:  diminished breath sounds, no wheezing Heart:  regular rate and rhythm, S1, S2 without murmurs, rubs, gallops, or clicks Abdomen:  bowel sounds positive; abdomen soft and non-tender without masses, or organomegaly Extremities:  no clubbing, cyanosis, edema, or deformity noted Cervical Nodes:  no significant adenopathy   Impression & Recommendations:  Problem # 1:  ASTHMA (ICD-493.90) He has borderline obstruction and significant bronchodilator response on PFT today.  He has noticed symptomatic benefit from proair.  I will start him on Qvar 80 micrograms two puffs two times a day.  He is to continue proair as needed.  He will get his medications through the [place] in [place].  Problem # 2:  CHRONIC RHINITIS (ICD-472.0) I will continue him  on nasal irrigation and nasonex for now.  Problem # 3:  GERD (ICD-530.81) This seems to be controlled with PPI therapy.  Problem # 4:  OBSTRUCTIVE SLEEP APNEA (ICD-327.23) Will continue clinical observation.  He feels his sleep is better after his asthma has improved.  Problem # 5:  VOCAL CORD DISORDER (ICD-478.5) He likely had vocal cord dysfunction which was being triggered by his rhinitis, post-nasal drip,  and GERD.  This has improved with therapy for these causes.  I don't think he needs referral to speech therapy at this time.  Medications Added to Medication List This Visit: 1)  Qvar 80 Mcg/act Aers (Beclomethasone dipropionate) .... Two puffs two times a day  Complete Medication List: 1)  Qvar 80 Mcg/act Aers (Beclomethasone dipropionate) .... Two puffs two times a day 2)  Proair Hfa 108 (90 Base) Mcg/act Aers (Albuterol sulfate) .... Two puffs qid as needed 3)  Nasonex 50 Mcg/act Susp (Mometasone furoate) .... Two sprays once daily 4)  Omeprazole 20 Mg Tbec (Omeprazole) .Marland Kitchen.. 1 by mouth daily 5)  Simvastatin 80 Mg Tabs (Simvastatin) .Marland Kitchen.. 1 by mouth daily 6)  Atenolol 25 Mg Tabs (Atenolol) .Marland Kitchen.. 1 by mouth daily 7)  Diclofenac Sodium 75 Mg Tbec (Diclofenac sodium) .Marland Kitchen.. 1 by mouth two times a day  Other Orders: Est. Patient Level III (09811)  Patient Instructions: 1)  Qvar 80 micrograms two puffs two times a day, and rinse your mouth after using 2)  Proair two puffs up to four times per day as needed 3)  Nasal irrigation once daily 4)  Nasonex two sprays once daily  5)  Follow up in 2 months Prescriptions: QVAR 80 MCG/ACT AERS (BECLOMETHASONE DIPROPIONATE) two puffs two times a day  #1 x 3   Entered and Authorized by:   Coralyn Helling MD   Signed by:   Coralyn Helling MD on 02/19/2009   Method used:   Print then Give to Patient   RxID:   [id number]

## 2010-07-27 NOTE — Assessment & Plan Note (Signed)
Summary: self ref/per pt call/wheezing/cough/wife is a pt/mhh   CC:  Pulmonary consult for wheezing.Marland Kitchen  History of Present Illness: I met Douglas Carpenter today for evaluation of his wheezing.  He has noticed wheezing more at night, but also gets this during the day.  His wife seems to notice this more than he does.  He gets coughing spells, but does not bring up sputum.  He denies hemoptysis.  He feels his breathing does get heavy.  His weight has been steady.  He does have sinus congestion and post-nasal drip.  He has frequent sinus infections, and clears his throat frequently.  He gets eye irritation with the air conditioner in the car.  He also has problems around grass.  He owns two dogs and two cats.  He has never had allergy tests, and never been told he had asthma.  He gets a rash when he takes aspirin.  He has bad reactions to poison ivy.  He quit smoking in 1998 after smoking 1 pack per day for about 20 years.  There is no history of pneumonia or TB.  He is retired from [employer]  He was an Pension scheme manager and scout, and left the service in 1997.  He worked as a Chief Operating Officer.  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  He does feel sleepy during the day.  His wife says that he snores at night.  He was previously told he had sleep apnea, but was never on therapy for it.  He was told it wasn't that bad.  Spirometry today showed borderline obstruction.  Preventive Screening-Counseling & Management  Alcohol-Tobacco     Smoking Status: quit     Year Quit: 1998     Pack years: 20 years x1 ppd  Current Medications (verified): 1)  Omeprazole 20 Mg Tbec (Omeprazole) .Marland Kitchen.. 1 By Mouth Daily 2)  Simvastatin 80 Mg Tabs (Simvastatin) .Marland Kitchen.. 1 By Mouth Daily 3)  Atenolol 25 Mg Tabs (Atenolol) .Marland Kitchen.. 1 By Mouth Daily 4)  Diclofenac Sodium 75 Mg Tbec (Diclofenac Sodium) .Marland Kitchen.. 1 By Mouth Two Times A Day  Allergies (verified): 1)  ! Jonne Ply  Past History:  Risk Factors: Smoking Status: quit  (01/27/2009)  Past Medical History: Obstructive sleep apnea      - Not on therapy Hypertension Hyperlipidemia GERD Low back pain  Past Surgical History: Left shoulder arthroscopy  Family History: Heart disease--MGF. MGM Family History Colon Cancer--MGF Family History Lung Cancer--mother, MGF Family History Prostate Cancer--MGF  Social History: Married Retired Insurance underwriter Former smokerSmoking Status:  quit Pack years:  20 years x1 ppd  Review of Systems       The patient complains of shortness of breath with activity, acid heartburn, headaches, and joint stiffness or pain.  The patient denies shortness of breath at rest, productive cough, non-productive cough, coughing up blood, chest pain, irregular heartbeats, indigestion, loss of appetite, weight change, abdominal pain, difficulty swallowing, sore throat, tooth/dental problems, nasal congestion/difficulty breathing through nose, sneezing, itching, ear ache, anxiety, depression, hand/feet swelling, rash, change in color of mucus, and fever.    Vital Signs:  Patient profile:   55 year old male Height:      67 inches (170.18 cm) Weight:      240.50 pounds (109.32 kg) BMI:     37.80 O2 Sat:      95 % on Room air Temp:     98.3 degrees F (36.83 degrees C) oral Pulse rate:   82 /  minute BP sitting:   120 / 70  (left arm) Cuff size:   large  Vitals Entered By: Michel Bickers CMA (January 27, 2009 11:42 AM)  O2 Flow:  Room air CC: Pulmonary consult for wheezing.   Physical Exam  General:  normal appearance, healthy appearing, and obese.   Eyes:  PERRLA and EOMI.   Ears:  TMs intact and clear with normal canals Nose:  clear nasal discharge, no sinus tenderness Mouth:  MP 3, enlarged tongue, no exudate Neck:  no JVD, no thyromegaly, wheeze heard over throat Chest Wall:  no deformities noted Lungs:  diminished breath sounds, expiratory wheeze that partly clears with humming Heart:  regular rate and rhythm, S1, S2  without murmurs, rubs, gallops, or clicks Abdomen:  bowel sounds positive; abdomen soft and non-tender without masses, or organomegaly Pulses:  pulses normal Extremities:  no clubbing, cyanosis, edema, or deformity noted Neurologic:  CN II-XII grossly intact with normal reflexes, coordination, muscle strength and tone Cervical Nodes:  no significant adenopathy   Impression & Recommendations:  Problem # 1:  WHEEZING (ICD-786.07) He has symptoms suggestive of allergies, asthma, and atopy.  He also has physical findings suggestive of vocal cord dysfunction and upper airway instability.  To further evaluate this I will schedule him for a full set of PFT's, and a chest xray.  I will start him on nasal irrigation, and nasonex for his sinuses.  He can also use claritin, or equivalent, as needed for his allergies.  I will give him an empiric trial of proair, and depending on his response decide if he needs to have additional inhalers.  I have also advised [facility] [place] [facility] if additional interventions are needed for his reflux since this may also be contributing to his upper airway irritation.  Along these lines his NSAID use could be exacerbating his reflux, and I have advised him to discuss this [facility] with the [facility] as well.  Problem # 2:  CHRONIC RHINITIS (ICD-472.0) Will start him on sinus regimen as above.  Depending on his response he may need further allergy testing and sinus imaging.  Problem # 3:  GERD (ICD-530.81) He is to continue on omeprazole, and [facility] to see if any further interventions are needed for this.  Problem # 4:  OBSTRUCTIVE SLEEP APNEA (ICD-327.23) Will monitor this for now.  He could be getting upper airway irritation from recurrent snoring and apnea.  Medications Added to Medication List This Visit: 1)  Omeprazole 20 Mg Tbec (Omeprazole) .Marland Kitchen.. 1 by mouth daily 2)  Simvastatin 80 Mg Tabs (Simvastatin) .Marland Kitchen.. 1 by mouth daily 3)   Atenolol 25 Mg Tabs (Atenolol) .Marland Kitchen.. 1 by mouth daily 4)  Diclofenac Sodium 75 Mg Tbec (Diclofenac sodium) .Marland Kitchen.. 1 by mouth two times a day 5)  Nasonex 50 Mcg/act Susp (Mometasone furoate) .... Two sprays once daily 6)  Proair Hfa 108 (90 Base) Mcg/act Aers (Albuterol sulfate) .... Two puffs qid as needed  Complete Medication List: 1)  Omeprazole 20 Mg Tbec (Omeprazole) .Marland Kitchen.. 1 by mouth daily 2)  Simvastatin 80 Mg Tabs (Simvastatin) .Marland Kitchen.. 1 by mouth daily 3)  Atenolol 25 Mg Tabs (Atenolol) .Marland Kitchen.. 1 by mouth daily 4)  Diclofenac Sodium 75 Mg Tbec (Diclofenac sodium) .Marland Kitchen.. 1 by mouth two times a day 5)  Nasonex 50 Mcg/act Susp (Mometasone furoate) .... Two sprays once daily 6)  Proair Hfa 108 (90 Base) Mcg/act Aers (Albuterol sulfate) .Marland KitchenMarland KitchenMarland Kitchen  Two puffs qid as needed  Other Orders: Consultation Level IV (96295) Spirometry w/Graph (94010) Full Pulmonary Function Test (PFT) T-2 View CXR (71020TC)  Patient Instructions: 1)  Nasal irrigation once daily before nasonex 2)  Nasonex two sprays once daily  3)  Claritin 10 mg once daily as needed for allergies 4)  Proair two puffs as needed for breathing 5)  Chest xray today 6)  Will schedule breathing test (PFT) 7)  Spe[facility] to [facility] doctor at the [facility] about your reflux 8)  Follow up in 3 to 4 weeks Prescriptions: PROAIR HFA 108 (90 BASE) MCG/ACT AERS (ALBUTEROL SULFATE) two puffs qid as needed  #1 x 3   Entered and Authorized by:   Coralyn Helling MD   Signed by:   Coralyn Helling MD on 01/27/2009   Method used:   Print then Give to Patient   RxID:   [id number] NASONEX 50 MCG/ACT SUSP (MOMETASONE FUROATE) two sprays once daily  #1 x 3   Entered and Authorized by:   Coralyn Helling MD   Signed by:   Coralyn Helling MD on 01/27/2009   Method used:   Print then Give to Patient   RxID:   [id number]

## 2010-07-27 NOTE — Consult Note (Signed)
Summary: [facility]   Imported By: Maryln Gottron 01/11/2010 14:17:49  _____________________________________________________________________  External Attachment:    Type:   Image     Comment:   External Document

## 2010-07-27 NOTE — Assessment & Plan Note (Signed)
Summary: to be est/njr   Vital Signs:  Patient profile:   55 year old male Height:      66.5 inches Weight:      214 pounds O2 Sat:      97 % on Room air Temp:     98.8 degrees F BP sitting:   100 / 62  (left arm) Cuff size:   regular  Vitals Entered By: Duard Brady LPN (August 25, 1608 1:06 PM)  O2 Flow:  Room air And and a the a we are and he will the a he and the the [facility] and in no and in the low and he and was positive for a.newmed and is a the is a Government social research officer number one and Misty Stanley is a I.CC: new to establish pcp / no problems - just to discuss general health issues Is Patient Diabetic? No   Primary Care Provider:  [facility]  CC:  new to establish pcp / no problems - just to discuss general health issues.  History of Present Illness: 80 -year-old patient who is seen today to establish with our practice.  He has a long history of hypertension and dyslipidemia.  He has been evaluated by pulmonary medicine recently for mild asthma.  He self discontinued bronchodilator therapy after one week and feels quite well.  He has wife had initiated a weight loss and exercise regimen, and he has successfully lost down from 248 to 214 pounds.  He feels quite well.  He is a history of dyslipidemia  Preventive Screening-Counseling & Management  Alcohol-Tobacco     Smoking Status: quit  Caffeine-Diet-Exercise     Does Patient Exercise: yes  Problems Prior to Update: 1)  Vocal Cord Disorder  (ICD-478.5) 2)  Gerd  (ICD-530.81) 3)  Chronic Rhinitis  (ICD-472.0) 4)  Asthma  (ICD-493.90) 5)  Obstructive Sleep Apnea  (ICD-327.23) 6)  Hypertension  (ICD-401.9) 7)  Hyperlipidemia  (ICD-272.4)  Allergies: 1)  ! Jonne Ply  Past History:  Past Medical History: Asthma with borderline obstruction      - PFT 02/19/09 FEV1 2.90(90%), FVC 4.13(95%), FEV1% 70, TLC 5.62(93%), DLCO 99%, +BD Rhinitis Obstructive sleep apnea [facility] 2009)      - Not on  therapy Hypertension Hyperlipidemia GERD Low back pain Headache (basilar migraines) ED testosterone deficiency  Past Surgical History: Left shoulder arthroscopy (twice) colonoscopy  2009 [facility])  Family History: Reviewed history from 01/27/2009 and no changes required. Heart disease--MGF. MGM Family History Colon Cancer--MGF Family History Lung Cancer--mother, MGF Family History Prostate Cancer--MGF Father- details unknown Mother- died age 27; Lung Ca, headaches (basilar migraines) 1 Brother- 2 Sisters  Social History: Reviewed history from 01/27/2009 and no changes required. Married Retired Insurance underwriter Former smoker (d/c 1995) [employer] 22 yrs-retired  1997  Regular exercise-yes Does Patient Exercise:  yes  Review of Systems  The patient denies anorexia, fever, weight loss, weight gain, vision loss, decreased hearing, hoarseness, chest pain, syncope, dyspnea on exertion, peripheral edema, prolonged cough, headaches, hemoptysis, abdominal pain, melena, hematochezia, severe indigestion/heartburn, hematuria, incontinence, genital sores, muscle weakness, suspicious skin lesions, transient blindness, difficulty walking, depression, unusual weight change, abnormal bleeding, enlarged lymph nodes, angioedema, breast masses, and testicular masses.    Physical Exam  General:  overweight-appearing.  110/70overweight-appearing.   Head:  Normocephalic and atraumatic without obvious abnormalities. No apparent alopecia or balding. Eyes:  No corneal or conjunctival inflammation noted. EOMI. Perrla. Funduscopic exam benign, without hemorrhages, exudates or papilledema. Vision grossly normal.  Ears:  External ear exam shows no significant lesions or deformities.  Otoscopic examination reveals clear canals, tympanic membranes are intact bilaterally without bulging, retraction, inflammation or discharge. Hearing is grossly normal bilaterally. Nose:  External nasal examination  shows no deformity or inflammation. Nasal mucosa are pink and moist without lesions or exudates. Mouth:  Oral mucosa and oropharynx without lesions or exudates.  Teeth in good repair. Neck:  No deformities, masses, or tenderness noted. Chest Wall:  No deformities, masses, tenderness or gynecomastia noted. Breasts:  No masses or gynecomastia noted Lungs:  Normal respiratory effort, chest expands symmetrically. Lungs are clear to auscultation, no crackles or wheezes. Heart:  Normal rate and regular rhythm. S1 and S2 normal without gallop, murmur, click, rub or other extra sounds. Abdomen:  Bowel sounds positive,abdomen soft and non-tender without masses, organomegaly or hernias noted. Genitalia:  Testes bilaterally descended without nodularity, tenderness or masses. No scrotal masses or lesions. No penis lesions or urethral discharge. Msk:  No deformity or scoliosis noted of thoracic or lumbar spine.   Pulses:  R and L carotid,radial,femoral,dorsalis pedis and posterior tibial pulses are full and equal bilaterally Extremities:  No clubbing, cyanosis, edema, or deformity noted with normal full range of motion of all joints.   Neurologic:  No cranial nerve deficits noted. Station and gait are normal. Plantar reflexes are down-going bilaterally. DTRs are symmetrical throughout. Sensory, motor and coordinative functions appear intact. Skin:  Intact without suspicious lesions or rashes Cervical Nodes:  No lymphadenopathy noted Axillary Nodes:  No palpable lymphadenopathy Inguinal Nodes:  No significant adenopathy Psych:  Cognition and judgment appear intact. Alert and cooperative with normal attention span and concentration. No apparent delusions, illusions, hallucinations   Impression & Recommendations:  Problem # 1:  HEADACHE (ICD-784.0)  His updated medication list for this problem includes:    Atenolol 25 Mg Tabs (Atenolol) .Marland Kitchen... 1 by mouth daily    Diclofenac Sodium 75 Mg Tbec (Diclofenac  sodium) .Marland Kitchen... 1 by mouth two times a day  His updated medication list for this problem includes:    Atenolol 25 Mg Tabs (Atenolol) .Marland Kitchen... 1 by mouth daily    Diclofenac Sodium 75 Mg Tbec (Diclofenac sodium) .Marland Kitchen... 1 by mouth two times a day  Problem # 2:  OBSTRUCTIVE SLEEP APNEA (ICD-327.23)  Problem # 3:  HYPERTENSION (ICD-401.9)  His updated medication list for this problem includes:    Atenolol 25 Mg Tabs (Atenolol) .Marland Kitchen... 1 by mouth daily  His updated medication list for this problem includes:    Atenolol 25 Mg Tabs (Atenolol) .Marland Kitchen... 1 by mouth daily  Problem # 4:  HYPERLIPIDEMIA (ICD-272.4)  His updated medication list for this problem includes:    Simvastatin 80 Mg Tabs (Simvastatin) .Marland Kitchen... 1 by mouth daily  His updated medication list for this problem includes:    Simvastatin 80 Mg Tabs (Simvastatin) .Marland Kitchen... 1 by mouth daily  Complete Medication List: 1)  Qvar 80 Mcg/act Aers (Beclomethasone dipropionate) .... Two puffs two times a day 2)  Proair Hfa 108 (90 Base) Mcg/act Aers (Albuterol sulfate) .... Two puffs qid as needed 3)  Nasonex 50 Mcg/act Susp (Mometasone furoate) .... Two sprays once daily 4)  Omeprazole 20 Mg Tbec (Omeprazole) .Marland Kitchen.. 1 by mouth daily 5)  Simvastatin 80 Mg Tabs (Simvastatin) .Marland Kitchen.. 1 by mouth daily 6)  Atenolol 25 Mg Tabs (Atenolol) .Marland Kitchen.. 1 by mouth daily 7)  Diclofenac Sodium 75 Mg Tbec (Diclofenac sodium) .Marland Kitchen.. 1 by mouth two times a day 8)  Cialis  20 Mg Tabs (Tadalafil) .... Use daily as directed  Patient Instructions: 1)  It is important that you exercise regularly at least 20 minutes 5 times a week. If you develop chest pain, have severe difficulty breathing, or feel very tired , stop exercising immediately and seek medical attention. 2)  You need to lose weight. Consider a lower calorie diet and regular exercise.  3)  Limit your Sodium (Salt). Prescriptions: CIALIS 20 MG TABS (TADALAFIL) use daily as directed  #6 x 12   Entered and Authorized by:    Gordy Savers  MD   Signed by:   Gordy Savers  MD on 08/25/2009   Method used:   Print then Give to Patient   RxID:   [id number] DICLOFENAC SODIUM 75 MG TBEC (DICLOFENAC SODIUM) 1 by mouth two times a day  #180 x 6   Entered and Authorized by:   Gordy Savers  MD   Signed by:   Gordy Savers  MD on 08/25/2009   Method used:   Print then Give to Patient   RxID:   [id number] ATENOLOL 25 MG TABS (ATENOLOL) 1 by mouth daily  #90 x 6   Entered and Authorized by:   Gordy Savers  MD   Signed by:   Gordy Savers  MD on 08/25/2009   Method used:   Print then Give to Patient   RxID:   [id number] SIMVASTATIN 80 MG TABS (SIMVASTATIN) 1 by mouth daily  #90 x 6   Entered and Authorized by:   Gordy Savers  MD   Signed by:   Gordy Savers  MD on 08/25/2009   Method used:   Print then Give to Patient   RxID:   [id number] OMEPRAZOLE 20 MG TBEC (OMEPRAZOLE) 1 by mouth daily  #90 x 6   Entered and Authorized by:   Gordy Savers  MD   Signed by:   Gordy Savers  MD on 08/25/2009   Method used:   Print then Give to Patient   RxID:   [id number] CIALIS 20 MG TABS (TADALAFIL) use daily as directed  #6 x 12   Entered and Authorized by:   Gordy Savers  MD   Signed by:   Gordy Savers  MD on 08/25/2009   Method used:   Electronically to        [facility] [phone]* (retail)       [address]       [place], [place]  [address]       Ph: [phone] or [phone]       Fax: [phone]   RxID:   [id number] DICLOFENAC SODIUM 75 MG TBEC (DICLOFENAC SODIUM) 1 by mouth two times a day  #180 x 6   Entered and Authorized by:   Gordy Savers  MD   Signed by:   Gordy Savers  MD on 08/25/2009   Method used:   [facility]        [facility] [phone]* (retail)       [address]       [place], [place]   [id number]       Ph: [phone] or [phone]       Fax: [phone]   RxID:   [id number] ATENOLOL 25 MG TABS (ATENOLOL) 1 by mouth daily  #  90 x 6   Entered and Authorized by:   Gordy Savers  MD   Signed by:   Gordy Savers  MD on 08/25/2009   Method used:   [facility]        [facility] [address] [phone]* (retail)       [address]       [place], [place]  [address]       Ph: [phone] or [phone]       Fax: [phone]   RxID:   [id number] SIMVASTATIN 80 MG TABS (SIMVASTATIN) 1 by mouth daily  #90 x 6   Entered and Authorized by:   Gordy Savers  MD   Signed by:   Gordy Savers  MD on 08/25/2009   Method used:   [facility]ron[facility]        [facility]  [address] [phone]* (retail)       [address]       [place], [place]  [address]       Ph: [phone] or [phone]       Fax: [phone]   RxID:   [id number] OMEPRAZOLE 20 MG TBEC (OMEPRAZOLE) 1 by mouth daily  #90 x 6   Entered and Authorized by:   Gordy Savers  MD   Signed by:   Gordy Savers  MD on 08/25/2009   Method used:   Electronically to        [facility] [address] [facility] [address] [phone]* (retail)       [address]       [place], [place]  [id number]       Ph: [phone] or [phone]       Fax: [phone]   RxID:   [id number]

## 2010-07-27 NOTE — Medication Information (Signed)
Summary: Prior Authorization Request for Levitra  Prior Authorization Request for Levitra   Imported By: Maryln Gottron 09/01/2009 13:26:28  _____________________________________________________________________  External Attachment:    Type:   Image     Comment:   External Document

## 2010-07-29 NOTE — Assessment & Plan Note (Signed)
Summary: conjunctivitis/dm   Vital Signs:  Patient profile:   55 year old male Weight:      213 pounds Temp:     98.6 degrees F Pulse rate:   68 / minute BP sitting:   120 / 80  (right arm)  Vitals Entered By: Bland Span frazier, cma CC: possible conjunctivitis...sx  began sunday   Primary Care Provider:  [facility]  CC:  possible conjunctivitis...sx  began sunday.  History of Present Illness: Patient presents to clinic as a workin for evaluation of red eye. States 3-4 day h/o right eye redness. Developed spontaneously without injury/trauma.  Eye has felt irritated lower lateral aspect with minimal pain. All symptoms now improved over last 48 hours. Has had clear watery drainage now improved. Denies fever, mucopurulent drainage or change in visual acuity. No other complaints.  Current Medications (verified): 1)  Omeprazole 20 Mg Tbec (Omeprazole) .Marland Kitchen.. 1 By Mouth Daily 2)  Simvastatin 80 Mg Tabs (Simvastatin) .Marland Kitchen.. 1 By Mouth Daily 3)  Atenolol 25 Mg Tabs (Atenolol) .Marland Kitchen.. 1 By Mouth Daily 4)  Diclofenac Sodium 75 Mg Tbec (Diclofenac Sodium) .Marland Kitchen.. 1 By Mouth Two Times A Day 5)  Levitra 20 Mg Tabs (Vardenafil Hcl) .... Uad  Allergies: 1)  ! Asa  Review of Systems      See HPI  Physical Exam  General:  Well-developed,well-nourished,in no acute distress; alert,appropriate and cooperative throughout examination Head:  Normocephalic and atraumatic without obvious abnormalities. No apparent alopecia or balding. Eyes:  vision grossly intact, pupils equal, pupils round, and pupils reactive to light.  Conjunctival injection and mild redness right lateral lower aspect. EOM intact. Left eye nl.   Impression & Recommendations:  Problem # 1:  UNSPECIFIED CONJUNCTIVITIS (ICD-372.30) Assessment New Suspect viral etiology. Symptoms improving. Recommend observation and notify if symptoms worsen or develops mucopurulent discharge and/or decrease in visual acuity. Discussed was to  avoid/reduce infection transmission. Followup if no improvement or worsening.  Complete Medication List: 1)  Omeprazole 20 Mg Tbec (Omeprazole) .Marland Kitchen.. 1 by mouth daily 2)  Simvastatin 80 Mg Tabs (Simvastatin) .Marland Kitchen.. 1 by mouth daily 3)  Atenolol 25 Mg Tabs (Atenolol) .Marland Kitchen.. 1 by mouth daily 4)  Diclofenac Sodium 75 Mg Tbec (Diclofenac sodium) .Marland Kitchen.. 1 by mouth two times a day 5)  Levitra 20 Mg Tabs (Vardenafil hcl) .... Uad   Orders Added: 1)  Est. Patient Level II [16[address]]

## 2010-07-29 NOTE — Assessment & Plan Note (Signed)
Summary: lump on jaw and inside of mouth on lft side/ok per Kim/cjr   Vital Signs:  Patient profile:   55 year old male Weight:      210 pounds Temp:     98.2 degrees F oral BP sitting:   120 / 80  (right arm) Cuff size:   regular  Vitals Entered By: Duard Brady LPN (July 16, 2010 4:00 PM) CC:  jaw pain and swelling Is Patient Diabetic? No   Primary Care Provider:  [facility]  CC:   jaw pain and swelling.  History of Present Illness: 55 year old patient, who noted the abrupt onset of left jaw swelling.  Yesterday.  It was somewhat painful, especially with chewing food.  The area swelling in the left upper jaw has receded.  He also has noticed some soft tissue swelling on the inside of his left cheek.  He states that he has had a habit of shoe and the inside of his cheek since he was a young boy.  Presently, the swelling involving the left jaw area has resolved.  There is been no fever or constitutional complaints.  He does have a history of treated hypertension and dyslipidemia  Allergies: 1)  ! Jonne Ply  Past History:  Past Medical History: Reviewed history from 08/25/2009 and no changes required. Asthma with borderline obstruction      - PFT 02/19/09 FEV1 2.90(90%), FVC 4.13(95%), FEV1% 70, TLC 5.62(93%), DLCO 99%, +BD Rhinitis Obstructive sleep apnea [facility] 2009)      - Not on therapy Hypertension Hyperlipidemia GERD Low back pain Headache (basilar migraines) ED testosterone deficiency  Review of Systems  The patient denies anorexia, fever, weight loss, weight gain, vision loss, decreased hearing, hoarseness, chest pain, syncope, dyspnea on exertion, peripheral edema, prolonged cough, headaches, hemoptysis, abdominal pain, melena, hematochezia, severe indigestion/heartburn, hematuria, incontinence, genital sores, muscle weakness, suspicious skin lesions, transient blindness, difficulty walking, depression, unusual weight change, abnormal bleeding,  enlarged lymph nodes, angioedema, breast masses, and testicular masses.    Physical Exam  General:  Well-developed,well-nourished,in no acute distress; alert,appropriate and cooperative throughout examination Head:  Normocephalic and atraumatic without obvious abnormalities. No apparent alopecia or balding. Eyes:  No corneal or conjunctival inflammation noted. EOMI. Perrla. Funduscopic exam benign, without hemorrhages, exudates or papilledema. Vision grossly normal. Ears:  External ear exam shows no significant lesions or deformities.  Otoscopic examination reveals clear canals, tympanic membranes are intact bilaterally without bulging, retraction, inflammation or discharge. Hearing is grossly normal bilaterally. Mouth:  an area of soft tissue swelling was present involving the left upper cheek area near the upper dental region Neck:  no adenopathy or parotid gland  enlargement Lungs:  Normal respiratory effort, chest expands symmetrically. Lungs are clear to auscultation, no crackles or wheezes.   Impression & Recommendations:  Problem # 1:  SIALOADENITIS (ICD-527.2) this has now resolved  Problem # 2:  HYPERTENSION (ICD-401.9)  His updated medication list for this problem includes:    Atenolol 25 Mg Tabs (Atenolol) .Marland Kitchen... 1 by mouth daily  Complete Medication List: 1)  Omeprazole 20 Mg Tbec (Omeprazole) .Marland Kitchen.. 1 by mouth daily 2)  Simvastatin 80 Mg Tabs (Simvastatin) .Marland Kitchen.. 1 by mouth daily 3)  Atenolol 25 Mg Tabs (Atenolol) .Marland Kitchen.. 1 by mouth daily 4)  Diclofenac Sodium 75 Mg Tbec (Diclofenac sodium) .Marland Kitchen.. 1 by mouth two times a day 5)  Levitra 20 Mg Tabs (Vardenafil hcl) .... Uad  Patient Instructions: 1)  Please schedule a follow-up appointment in 4 months. 2)  Limit your Sodium (Salt) to less than 2 grams a day(slightly less than 1/2 a teaspoon) to prevent fluid retention, swelling, or worsening of symptoms. 3)  It is important that you exercise regularly at least 20 minutes 5 times a  week. If you develop chest pain, have severe difficulty breathing, or feel very tired , stop exercising immediately and seek medical attention. 4)  Please schedule a follow-up appointment as needed.   Orders Added: 1)  Est. Patient Level III [84132]

## 2010-07-29 NOTE — Progress Notes (Signed)
Summary: Pt req ov for after 4pm tomorrow re: lump in jaw and mouth  Phone Note Call from Patient Call back at Work Phone [phone]   Caller: Patient Summary of Call: Pt has discovered a lump in his lft jaw, under ear. Also has a lump on inside of mouth, on upper lft side. Pt says that it is painful to chew. Pt is req an ov for late afternoon tomorrow 07/16/10, preferably after 4pm if possible.  Initial call taken by: Lucy Antigua,  July 15, 2010 4:42 PM  Follow-up for Phone Call        4pm is our lastest appt -  Follow-up by: Duard Brady LPN,  July 15, 2010 4:46 PM  Additional Follow-up for Phone Call Additional follow up Details #1::        Pt has been sch for 4pm 07/16/10.  Additional Follow-up by: Lucy Antigua,  July 15, 2010 5:03 PM

## 2010-10-05 ENCOUNTER — Telehealth: Payer: Self-pay | Admitting: Internal Medicine

## 2010-10-05 ENCOUNTER — Ambulatory Visit: Payer: Self-pay | Admitting: Internal Medicine

## 2010-10-05 MED ORDER — PREDNISONE (PAK) 10 MG PO TABS: 10.0000 mg | ORAL_TABLET | Freq: Every day | ORAL | Status: DC

## 2010-10-05 NOTE — Telephone Encounter (Signed)
Please advise 

## 2010-10-05 NOTE — Telephone Encounter (Signed)
Pt has poison ivy for 6 days. Pt was sch to come in for ov today, but had to cx. Pt says that pt has been using calamine lotion and benadryl, but poison ivy still spreading. Pt is wondering if there is a shot to help with itching? Pls call and advise.

## 2010-10-05 NOTE — Telephone Encounter (Signed)
Please call in 10 mg 12 day prednisone Dosepak

## 2010-10-05 NOTE — Telephone Encounter (Signed)
Spoke with pt - informed of rx to call out - [employer] [address]

## 2010-10-25 ENCOUNTER — Encounter: Payer: Self-pay | Admitting: Internal Medicine

## 2010-10-26 ENCOUNTER — Ambulatory Visit: Payer: Self-pay | Admitting: Internal Medicine

## 2010-10-28 ENCOUNTER — Encounter: Payer: Self-pay | Admitting: Internal Medicine

## 2010-11-01 ENCOUNTER — Encounter: Payer: Self-pay | Admitting: Internal Medicine

## 2010-11-01 ENCOUNTER — Ambulatory Visit (INDEPENDENT_AMBULATORY_CARE_PROVIDER_SITE_OTHER): Admitting: Internal Medicine

## 2010-11-01 DIAGNOSIS — I10 Essential (primary) hypertension: Secondary | ICD-10-CM

## 2010-11-01 DIAGNOSIS — J45909 Unspecified asthma, uncomplicated: Secondary | ICD-10-CM

## 2010-11-01 DIAGNOSIS — K219 Gastro-esophageal reflux disease without esophagitis: Secondary | ICD-10-CM

## 2010-11-01 NOTE — Patient Instructions (Signed)
Avoids foods high in acid such as tomatoes citrus juices, and spicy foods.  Avoid eating within two hours of lying down or before exercising.  Do not overheat.  Try smaller more frequent meals.  If symptoms persist, elevate the head of her bed 12 inches while sleeping.  Limit your sodium (Salt) intake  You need to lose weight.  Consider a lower calorie diet and regular exercise.  Please check your blood pressure on a regular basis.  If it is consistently greater than 150/90, please make an office appointment. Return in 6 months for follow-up

## 2010-11-01 NOTE — Progress Notes (Signed)
  Subjective:    Patient ID: Douglas Carpenter, male    DOB: 12-Jun-1956, 55 y.o.   MRN: [id number]  HPI  55 year old patient who is in today for followup of hypertension. He has a history of allergic rhinitis and mild asthma. He has gastroesophageal reflux disease controlled on Prilosec. He has some mild low back pain otherwise doing well.    Review of Systems  Constitutional: Negative for fever, chills, appetite change and fatigue.  HENT: Negative for hearing loss, ear pain, congestion, sore throat, trouble swallowing, neck stiffness, dental problem, voice change and tinnitus.   Eyes: Negative for pain, discharge and visual disturbance.  Respiratory: Negative for cough, chest tightness, wheezing and stridor.   Cardiovascular: Negative for chest pain, palpitations and leg swelling.  Gastrointestinal: Negative for nausea, vomiting, abdominal pain, diarrhea, constipation, blood in stool and abdominal distention.  Genitourinary: Negative for urgency, hematuria, flank pain, discharge, difficulty urinating and genital sores.  Musculoskeletal: Positive for back pain. Negative for myalgias, joint swelling, arthralgias and gait problem.  Skin: Negative for rash.  Neurological: Negative for dizziness, syncope, speech difficulty, weakness, numbness and headaches.  Hematological: Negative for adenopathy. Does not bruise/bleed easily.  Psychiatric/Behavioral: Negative for behavioral problems and dysphoric mood. The patient is not nervous/anxious.        Objective:   Physical Exam  Constitutional: He is oriented to person, place, and time. He appears well-developed.  HENT:  Head: Normocephalic.  Right Ear: External ear normal.  Left Ear: External ear normal.  Eyes: Conjunctivae and EOM are normal.  Neck: Normal range of motion.  Cardiovascular: Normal rate and normal heart sounds.   Pulmonary/Chest: Breath sounds normal.  Abdominal: Bowel sounds are normal.  Musculoskeletal: Normal range of motion.  He exhibits no edema and no tenderness.  Neurological: He is alert and oriented to person, place, and time.  Psychiatric: He has a normal mood and affect. His behavior is normal.          Assessment & Plan:   Hypertension stable. Mild asthma Gastroesophageal reflux disease. Weight loss encouraged. We'll continue omeprazole and antireflux regimen

## 2010-11-05 ENCOUNTER — Encounter: Payer: Self-pay | Admitting: Internal Medicine

## 2010-11-05 ENCOUNTER — Ambulatory Visit (INDEPENDENT_AMBULATORY_CARE_PROVIDER_SITE_OTHER): Admitting: Internal Medicine

## 2010-11-05 VITALS — BP 130/78 | Temp 98.3°F | Wt 214.0 lb

## 2010-11-05 DIAGNOSIS — R21 Rash and other nonspecific skin eruption: Secondary | ICD-10-CM

## 2010-11-05 DIAGNOSIS — L259 Unspecified contact dermatitis, unspecified cause: Secondary | ICD-10-CM

## 2010-11-05 MED ORDER — METHYLPREDNISOLONE ACETATE 80 MG/ML IJ SUSP
120.0000 mg | Freq: Once | INTRAMUSCULAR | Status: AC
  Administered 2010-11-05: 120 mg via INTRAMUSCULAR

## 2010-11-05 NOTE — Patient Instructions (Addendum)
Call or return to clinic prn if these symptoms worsen or fail to improve as anticipated.  Take Benadryl every 6 hours as needed for itching

## 2010-11-05 NOTE — Progress Notes (Signed)
  Subjective:    Patient ID: Douglas Carpenter, male    DOB: 01-24-1956, 55 y.o.   MRN: [id number]  HPI   55 year old patient who is seen today with a 2 to three-day history of rash this is present primarily on the arms and face but also in the genital area. He had a similar rash about one month ago and is still attempting to clear out poison ivy from his backyard. Rash is quite pruritic   Review of Systems  Skin: Positive for rash.       Objective:   Physical Exam  Skin: Rash noted.       Scattered erythematous slightly vesicular rash over the right facial area and arms. This was consistent with a contact dermatitis          Assessment & Plan:    Contact dermatitis. Will treat with Depo-Medrol 80 mg IM. Will call if not improved

## 2010-11-12 NOTE — Op Note (Signed)
NAME:  SHLOME, BALDREE                           ACCOUNT NO.:  [id number]   MEDICAL RECORD NO.:  [id number]                   PATIENT TYPE:  AMB   LOCATION:  DSC                                  FACILITY:  [facility]   PHYSICIAN:  Feliberto Gottron. Turner Daniels, M.D.                DATE OF BIRTH:  [birth year]   DATE OF PROCEDURE:  08/28/2002  DATE OF DISCHARGE:                                 OPERATIVE REPORT   PREOPERATIVE DIAGNOSIS:  Left shoulder impingement syndrome with a large  type 2 subacromial spur.   POSTOPERATIVE DIAGNOSIS:  Left shoulder impingement syndrome with a large  type 2 subacromial spur.   PROCEDURE:  Left shoulder arthroscopic anterior inferior acromioplasty.   SURGEON:  Feliberto Gottron. Turner Daniels, M.D.   ASSISTANT:  None.   ANESTHESIA:  Interscalene block with general endotracheal.   ESTIMATED BLOOD LOSS:  Minimal.   FLUIDS REPLACED:  1 liter of crystalloid.   DRAINS:  None.   TOURNIQUET TIME:  None.   INDICATIONS FOR PROCEDURE:  A 55 year old man with left shoulder impingement  syndrome who has failed conservative treatment, and has an MRI scan showing  rotator cuff tendinosis.  Has a 1 cm type 2 subacromial spur and evidence of  an old cruciate ligament injury that appears to be healed.  In any event,  because of persistent pain, he is taken for arthroscopic evaluation and  treatment of his left shoulder with removal of the subacromial spur.   DESCRIPTION OF PROCEDURE:  The patient was identified by armband and taken  to the operating room at [facility]. Appropriate anesthetic  monitors were attached and interscalene block anesthesia induced to the left  upper extremity followed by general endotracheal anesthesia.  He was then  placed in the beach chair position and left upper extremity prepped and  draped in the usual sterile fashion from the wrist to the hemithorax.  Using  a #11 blade, we then made standard portals 1.5 cm anterior to the [facility] joint,  lateral to the  junction of the middle and posterior thirds of the acromion,  and posterior to the posterolateral corner of the acromion process.  The  inflow was placed anteriorly, the arthroscope laterally, and a 4.2 Great  White sucker shaver posteriorly, allowing subacromial bursectomy, outline of  the subacromial spur, and intermittently, we would cauterize any small  bleeders.  Once we had firmly outlined the subacromial spur, it was then  removed with a 4.5 hooded Vortex bur from the posterior portal creating a  flat type 1 acromion.  There was a very small spur on the tip of the distal  clavicle that was also incidentally debrided as well, creating a good  subacromial arch, allowing easy passage of the rotator cuff, which was  intact although irritated as it went underneath the spur.  We then placed  the arthroscope into the glenohumeral joint using  the posterior portal and  documented an intact labrum, rotator cuff, biceps tendon, and articular  cartilage.  Photographic documentation was made of these findings. The shoulder was  washed out with normal saline solution, the arthroscopic instruments were  removed and a dressing of Xeroform, 4x4 dressing, sponges, paper tape  applied followed by a sling. The patient was awakened and taken to the  recovery room without difficulty.                                               Feliberto Gottron. Turner Daniels, M.D.    Ovid Curd  D:  08/28/2002  T:  08/28/2002  Job:  [id number]

## 2010-12-24 ENCOUNTER — Telehealth: Payer: Self-pay | Admitting: *Deleted

## 2010-12-24 MED ORDER — PREDNISONE 10 MG PO TABS: 10.0000 mg | ORAL_TABLET | Freq: Every day | ORAL | Status: AC

## 2010-12-24 NOTE — Telephone Encounter (Signed)
OK 10 MG 12 DAY

## 2010-12-24 NOTE — Telephone Encounter (Signed)
Pt is asking if Dr. Kirtland Bouchard would refill Prednisone for poison oak on face and arms,

## 2010-12-24 NOTE — Telephone Encounter (Signed)
Pt.notified

## 2011-03-08 ENCOUNTER — Ambulatory Visit (INDEPENDENT_AMBULATORY_CARE_PROVIDER_SITE_OTHER): Admitting: Internal Medicine

## 2011-03-08 DIAGNOSIS — Z23 Encounter for immunization: Secondary | ICD-10-CM

## 2011-03-08 DIAGNOSIS — Z0000 Encounter for general adult medical examination without abnormal findings: Secondary | ICD-10-CM

## 2011-04-27 ENCOUNTER — Ambulatory Visit (INDEPENDENT_AMBULATORY_CARE_PROVIDER_SITE_OTHER): Admitting: Family Medicine

## 2011-04-27 ENCOUNTER — Encounter: Payer: Self-pay | Admitting: Family Medicine

## 2011-04-27 VITALS — BP 132/74 | HR 84 | Temp 98.3°F | Wt 215.0 lb

## 2011-04-27 DIAGNOSIS — I779 Disorder of arteries and arterioles, unspecified: Secondary | ICD-10-CM

## 2011-04-27 NOTE — Progress Notes (Signed)
  Subjective:    Patient ID: Douglas Carpenter, male    DOB: 08-01-1955, 55 y.o.   MRN: [id number]  HPI Here asking for an opinion on something. He goes to the [place] [facility] for most of his health care, and he had an eye exam there a few days ago. They told him they saw evidence of a "blockage" to the blood flow to the right eye, and they set up what sounds like carotid dopplers for 05-18-11. He feels fine, and he has no vision problems. He is concerned that this test is too far away and that he may need [place] to set one up sooner.    Review of Systems  Constitutional: Negative.   HENT: Negative.   Eyes: Negative.   Neurological: Negative.        Objective:   Physical Exam  Constitutional: He appears well-developed and well-nourished.  Eyes: Conjunctivae are normal. Pupils are equal, round, and reactive to light.  Neck: No thyromegaly present.       No carotid bruits  Cardiovascular: Normal rate, regular rhythm, normal heart sounds and intact distal pulses.   Pulmonary/Chest: Effort normal and breath sounds normal.  Lymphadenopathy:    He has no cervical adenopathy.          Assessment & Plan:  I told him that this test is very appropriate to evaluate the blood flow to his eyes. Since he is not symptomatic, there is no reason to rush and do a doppler here any sooner, especially when this would generate much more expense for him. He will wait and get the test at the [facility] as scheduled. Recheck prn

## 2011-05-02 ENCOUNTER — Ambulatory Visit: Admitting: Family Medicine

## 2011-06-06 ENCOUNTER — Ambulatory Visit (INDEPENDENT_AMBULATORY_CARE_PROVIDER_SITE_OTHER): Admitting: Internal Medicine

## 2011-06-06 ENCOUNTER — Ambulatory Visit (INDEPENDENT_AMBULATORY_CARE_PROVIDER_SITE_OTHER)
Admission: RE | Admit: 2011-06-06 | Discharge: 2011-06-06 | Disposition: A | Discharge status: Home / Self Care | Source: Ambulatory Visit | Attending: Internal Medicine | Admitting: Internal Medicine

## 2011-06-06 ENCOUNTER — Encounter: Payer: Self-pay | Admitting: Internal Medicine

## 2011-06-06 ENCOUNTER — Telehealth: Payer: Self-pay

## 2011-06-06 DIAGNOSIS — I10 Essential (primary) hypertension: Secondary | ICD-10-CM

## 2011-06-06 DIAGNOSIS — J111 Influenza due to unidentified influenza virus with other respiratory manifestations: Secondary | ICD-10-CM

## 2011-06-06 MED ORDER — HYDROCODONE-HOMATROPINE 5-1.5 MG/5ML PO SYRP: 5.0000 mL | ORAL_SOLUTION | Freq: Four times a day (QID) | ORAL | Status: AC | PRN

## 2011-06-06 MED ORDER — OSELTAMIVIR PHOSPHATE 75 MG PO CAPS: 75.0000 mg | ORAL_CAPSULE | Freq: Two times a day (BID) | ORAL | Status: AC

## 2011-06-06 NOTE — Telephone Encounter (Signed)
Pt left message to c/o fever, CP when he coughs, and congestion.  Returned call to pt and left message for pt to call back. Message also advised pt to visit urgent care or ED if sxs worsen before he is able to schedule an appointment

## 2011-06-06 NOTE — Patient Instructions (Addendum)
vimovo 1 twice daily  Tylenol 1-2 tabs po q4h prn  Cough medicine as directed

## 2011-06-06 NOTE — Progress Notes (Signed)
  Subjective:    Patient ID: COLA HIGHFILL, male    DOB: 1955/09/17, 55 y.o.   MRN: [id number]  HPI  55 year old patient who presents with a four-day history of fever chills and productive cough. He has had some chest wall pain associated with coughing. Cough is minimally productive. He has treated hypertension and dyslipidemia that have been stable.    Review of Systems  Constitutional: Positive for fever, chills, diaphoresis and fatigue.  Respiratory: Positive for cough.   Cardiovascular: Positive for chest pain.       Objective:   Physical Exam  Constitutional: He is oriented to person, place, and time. He appears well-developed and well-nourished.       Appears unwell but in no acute distress. Blood pressure on arrival 103  HENT:  Head: Normocephalic.  Right Ear: External ear normal.  Left Ear: External ear normal.  Eyes: Conjunctivae and EOM are normal.  Neck: Normal range of motion.  Cardiovascular: Normal rate and normal heart sounds.   Pulmonary/Chest: Effort normal and breath sounds normal. No respiratory distress. He has no wheezes. He has no rales. He exhibits no tenderness.  Abdominal: Bowel sounds are normal.  Musculoskeletal: Normal range of motion. He exhibits no edema and no tenderness.  Neurological: He is alert and oriented to person, place, and time.  Psychiatric: He has a normal mood and affect. His behavior is normal.          Assessment & Plan:   Flu syndrome. We'll check a chest x-ray and if normal will treat with Tamiflu as well as symptomatically Hypertension stable

## 2011-06-07 NOTE — Progress Notes (Signed)
Quick Note:  Spoke with pt - informed of resutls ______

## 2011-09-28 ENCOUNTER — Encounter: Payer: Self-pay | Admitting: Internal Medicine

## 2011-09-28 ENCOUNTER — Ambulatory Visit (INDEPENDENT_AMBULATORY_CARE_PROVIDER_SITE_OTHER): Admitting: Internal Medicine

## 2011-09-28 VITALS — BP 110/72 | Temp 98.3°F | Wt 221.0 lb

## 2011-09-28 DIAGNOSIS — I10 Essential (primary) hypertension: Secondary | ICD-10-CM

## 2011-09-28 DIAGNOSIS — N400 Enlarged prostate without lower urinary tract symptoms: Secondary | ICD-10-CM

## 2011-09-28 DIAGNOSIS — R3989 Other symptoms and signs involving the genitourinary system: Secondary | ICD-10-CM

## 2011-09-28 DIAGNOSIS — R39198 Other difficulties with micturition: Secondary | ICD-10-CM

## 2011-09-28 DIAGNOSIS — E785 Hyperlipidemia, unspecified: Secondary | ICD-10-CM

## 2011-09-28 LAB — POCT URINALYSIS DIPSTICK
Bilirubin, UA: NEGATIVE
Blood, UA: NEGATIVE
Glucose, UA: NEGATIVE
Ketones, UA: NEGATIVE
Leukocytes, UA: NEGATIVE
Nitrite, UA: NEGATIVE
Protein, UA: NEGATIVE
Spec Grav, UA: 1.025
Urobilinogen, UA: 0.2
pH, UA: 6

## 2011-09-28 MED ORDER — TAMSULOSIN HCL 0.4 MG PO CAPS: 0.4000 mg | ORAL_CAPSULE | Freq: Every day | ORAL | Status: DC

## 2011-09-28 NOTE — Progress Notes (Signed)
  Subjective:    Patient ID: Douglas Carpenter, male    DOB: 03-11-1956, 56 y.o.   MRN: [id number]  HPI  56 year old patient who has treated hypertension and dyslipidemia. He is followed at [facility] hospital and does also have a history of testosterone deficiency and is on topical replacement medication. Past several months he has had increasing urinary frequency slow urination with decrease flow. His caffeine intake is moderately high. He takes no decongestants.    Review of Systems  Constitutional: Negative for fever, chills, appetite change and fatigue.  HENT: Negative for hearing loss, ear pain, congestion, sore throat, trouble swallowing, neck stiffness, dental problem, voice change and tinnitus.   Eyes: Negative for pain, discharge and visual disturbance.  Respiratory: Negative for cough, chest tightness, wheezing and stridor.   Cardiovascular: Negative for chest pain, palpitations and leg swelling.  Gastrointestinal: Negative for nausea, vomiting, abdominal pain, diarrhea, constipation, blood in stool and abdominal distention.  Genitourinary: Positive for urgency, frequency and decreased urine volume. Negative for hematuria, flank pain, discharge, difficulty urinating and genital sores.  Musculoskeletal: Negative for myalgias, back pain, joint swelling, arthralgias and gait problem.  Skin: Negative for rash.  Neurological: Negative for dizziness, syncope, speech difficulty, weakness, numbness and headaches.  Hematological: Negative for adenopathy. Does not bruise/bleed easily.  Psychiatric/Behavioral: Negative for behavioral problems and dysphoric mood. The patient is not nervous/anxious.        Objective:   Physical Exam  Constitutional: He is oriented to person, place, and time. He appears well-developed.  HENT:  Head: Normocephalic.  Right Ear: External ear normal.  Left Ear: External ear normal.  Eyes: Conjunctivae and EOM are normal.  Neck: Normal range of motion.  Cardiovascular:  Normal rate and normal heart sounds.   Pulmonary/Chest: Breath sounds normal.  Abdominal: Bowel sounds are normal.  Musculoskeletal: Normal range of motion. He exhibits no edema and no tenderness.  Neurological: He is alert and oriented to person, place, and time.  Psychiatric: He has a normal mood and affect. His behavior is normal.          Assessment & Plan:   Prostatism. We'll place on Flomax 0.4 mg daily. He will moderate his caffeine use and avoid decongestants Hypertension stable dyslipidemia

## 2011-09-28 NOTE — Patient Instructions (Signed)
Limit your sodium (Salt) intake    It is important that you exercise regularly, at least 20 minutes 3 to 4 times per week.  If you develop chest pain or shortness of breath seek  medical attention. 

## 2011-09-29 ENCOUNTER — Telehealth: Payer: Self-pay

## 2011-09-29 NOTE — Telephone Encounter (Signed)
Pt states he was given Tamulosin 0.4 mg.  Pt states he thought before he left the office that Dr. Amador Cunas told pt to take medication twice daily.  Pls clarify if medication is daily or bid.

## 2011-09-29 NOTE — Telephone Encounter (Signed)
Twice daily for 3 days, then one daily

## 2011-09-30 NOTE — Telephone Encounter (Signed)
Patient is aware 

## 2012-03-21 ENCOUNTER — Ambulatory Visit (INDEPENDENT_AMBULATORY_CARE_PROVIDER_SITE_OTHER): Admitting: Internal Medicine

## 2012-03-21 ENCOUNTER — Encounter: Payer: Self-pay | Admitting: Internal Medicine

## 2012-03-21 VITALS — BP 112/80 | Temp 98.1°F | Wt 222.0 lb

## 2012-03-21 DIAGNOSIS — I10 Essential (primary) hypertension: Secondary | ICD-10-CM

## 2012-03-21 DIAGNOSIS — E785 Hyperlipidemia, unspecified: Secondary | ICD-10-CM

## 2012-03-21 DIAGNOSIS — L259 Unspecified contact dermatitis, unspecified cause: Secondary | ICD-10-CM

## 2012-03-21 DIAGNOSIS — Z23 Encounter for immunization: Secondary | ICD-10-CM

## 2012-03-21 MED ORDER — METHYLPREDNISOLONE ACETATE 80 MG/ML IJ SUSP
80.0000 mg | Freq: Once | INTRAMUSCULAR | Status: AC
  Administered 2012-03-21: 80 mg via INTRAMUSCULAR

## 2012-03-21 MED ORDER — PREDNISONE (PAK) 10 MG PO TABS: ORAL_TABLET | ORAL | Status: DC

## 2012-03-21 NOTE — Patient Instructions (Addendum)
Call or return to clinic prn if these symptoms worsen or fail to improve as anticipated. Contact Dermatitis Contact dermatitis is a reaction to certain substances that touch the skin. Contact dermatitis can be either irritant contact dermatitis or allergic contact dermatitis. Irritant contact dermatitis does not require previous exposure to the substance for a reaction to occur. Allergic contact dermatitis only occurs if you have been exposed to the substance before. Upon a repeat exposure, your body reacts to the substance.   CAUSES   Many substances can cause contact dermatitis. Irritant dermatitis is most commonly caused by repeated exposure to mildly irritating substances, such as:  Makeup.   Soaps.   Detergents.   Bleaches.   Acids.   Metal salts, such as nickel.  Allergic contact dermatitis is most commonly caused by exposure to:  Poisonous plants.   Chemicals (deodorants, shampoos).   Jewelry.   Latex.   Neomycin in triple antibiotic cream.   Preservatives in products, including clothing.  SYMPTOMS   The area of skin that is exposed may develop:  Dryness or flaking.   Redness.   Cracks.   Itching.   Pain or a burning sensation.   Blisters.  With allergic contact dermatitis, there may also be swelling in areas such as the eyelids, mouth, or genitals.   DIAGNOSIS   Your caregiver can usually tell what the problem is by doing a physical exam. In cases where the cause is uncertain and an allergic contact dermatitis is suspected, a patch skin test may be performed to help determine the cause of your dermatitis. TREATMENT Treatment includes protecting the skin from further contact with the irritating substance by avoiding that substance if possible. Barrier creams, powders, and gloves may be helpful. Your caregiver may also recommend:  Steroid creams or ointments applied 2 times daily. For best results, soak the rash area in cool water for 20 minutes. Then apply the  medicine. Cover the area with a plastic wrap. You can store the steroid cream in the refrigerator for a "chilly" effect on your rash. That may decrease itching. Oral steroid medicines may be needed in more severe cases.   Antibiotics or antibacterial ointments if a skin infection is present.   Antihistamine lotion or an antihistamine taken by mouth to ease itching.   Lubricants to keep moisture in your skin.   Burow's solution to reduce redness and soreness or to dry a weeping rash. Mix one packet or tablet of solution in 2 cups cool water. Dip a clean washcloth in the mixture, wring it out a bit, and put it on the affected area. Leave the cloth in place for 30 minutes. Do this as often as possible throughout the day.   Taking several cornstarch or baking soda baths daily if the area is too large to cover with a washcloth.  Harsh chemicals, such as alkalis or acids, can cause skin damage that is like a burn. You should flush your skin for 15 to 20 minutes with cold water after such an exposure. You should also seek immediate medical care after exposure. Bandages (dressings), antibiotics, and pain medicine may be needed for severely irritated skin.   HOME CARE INSTRUCTIONS  Avoid the substance that caused your reaction.   Keep the area of skin that is affected away from hot water, soap, sunlight, chemicals, acidic substances, or anything else that would irritate your skin.   Do not scratch the rash. Scratching may cause the rash to become infected.   You may  take cool baths to help stop the itching.   Only take over-the-counter or prescription medicines as directed by your caregiver.   See your caregiver for follow-up care as directed to make sure your skin is healing properly.  SEEK MEDICAL CARE IF:    Your condition is not better after 3 days of treatment.   You seem to be getting worse.   You see signs of infection such as swelling, tenderness, redness, soreness, or warmth in the  affected area.   You have any problems related to your medicines.  Document Released: 06/10/2000 Document Revised: 06/02/2011 Document Reviewed: 11/16/2010 [facility] Patient Information 2012 [place], [place].

## 2012-03-21 NOTE — Progress Notes (Signed)
  Subjective:    Patient ID: Douglas Carpenter, male    DOB: Jan 05, 1956, 56 y.o.   MRN: [id number]  HPI  56 year old patient who presents with a several-day history of a rash. This initially began on his hands and arms. He has been treated for contact dermatitis in the past and describes himself as being extremely sensitive to poison ivy. His main complaint today is swelling and extreme pruritus in the genital area    Review of Systems  Skin: Positive for rash.       Objective:   Physical Exam  Constitutional: He appears well-developed and well-nourished. No distress.  Skin: Rash noted.       Penile and scrotal edema and erythema          Assessment & Plan:   Content dermatitis. Will treat with Depo-Medrol. Was also given a prescription for oral prednisone if no significant improvement early next week Hypertension stable Dyslipidemia. Continue Crestor. Samples provided

## 2012-09-20 ENCOUNTER — Other Ambulatory Visit

## 2012-09-27 ENCOUNTER — Encounter: Admitting: Internal Medicine

## 2012-11-10 ENCOUNTER — Emergency Department (HOSPITAL_COMMUNITY)

## 2012-11-10 ENCOUNTER — Emergency Department (HOSPITAL_COMMUNITY)
Admission: EM | Admit: 2012-11-10 | Discharge: 2012-11-10 | Disposition: A | Discharge status: Home / Self Care | Attending: Emergency Medicine | Admitting: Emergency Medicine

## 2012-11-10 ENCOUNTER — Encounter (HOSPITAL_COMMUNITY): Payer: Self-pay | Admitting: Emergency Medicine

## 2012-11-10 DIAGNOSIS — Z8639 Personal history of other endocrine, nutritional and metabolic disease: Secondary | ICD-10-CM | POA: Insufficient documentation

## 2012-11-10 DIAGNOSIS — M5431 Sciatica, right side: Secondary | ICD-10-CM

## 2012-11-10 DIAGNOSIS — Z7982 Long term (current) use of aspirin: Secondary | ICD-10-CM | POA: Insufficient documentation

## 2012-11-10 DIAGNOSIS — Z79899 Other long term (current) drug therapy: Secondary | ICD-10-CM | POA: Insufficient documentation

## 2012-11-10 DIAGNOSIS — M5430 Sciatica, unspecified side: Secondary | ICD-10-CM | POA: Insufficient documentation

## 2012-11-10 DIAGNOSIS — M545 Low back pain, unspecified: Secondary | ICD-10-CM | POA: Insufficient documentation

## 2012-11-10 DIAGNOSIS — R269 Unspecified abnormalities of gait and mobility: Secondary | ICD-10-CM | POA: Insufficient documentation

## 2012-11-10 DIAGNOSIS — Z8669 Personal history of other diseases of the nervous system and sense organs: Secondary | ICD-10-CM | POA: Insufficient documentation

## 2012-11-10 DIAGNOSIS — K219 Gastro-esophageal reflux disease without esophagitis: Secondary | ICD-10-CM | POA: Insufficient documentation

## 2012-11-10 DIAGNOSIS — J45909 Unspecified asthma, uncomplicated: Secondary | ICD-10-CM | POA: Insufficient documentation

## 2012-11-10 DIAGNOSIS — Z791 Long term (current) use of non-steroidal anti-inflammatories (NSAID): Secondary | ICD-10-CM | POA: Insufficient documentation

## 2012-11-10 DIAGNOSIS — I10 Essential (primary) hypertension: Secondary | ICD-10-CM | POA: Insufficient documentation

## 2012-11-10 DIAGNOSIS — Z8709 Personal history of other diseases of the respiratory system: Secondary | ICD-10-CM | POA: Insufficient documentation

## 2012-11-10 DIAGNOSIS — Z87891 Personal history of nicotine dependence: Secondary | ICD-10-CM | POA: Insufficient documentation

## 2012-11-10 DIAGNOSIS — Z862 Personal history of diseases of the blood and blood-forming organs and certain disorders involving the immune mechanism: Secondary | ICD-10-CM | POA: Insufficient documentation

## 2012-11-10 DIAGNOSIS — Z87448 Personal history of other diseases of urinary system: Secondary | ICD-10-CM | POA: Insufficient documentation

## 2012-11-10 DIAGNOSIS — R300 Dysuria: Secondary | ICD-10-CM | POA: Insufficient documentation

## 2012-11-10 DIAGNOSIS — E785 Hyperlipidemia, unspecified: Secondary | ICD-10-CM | POA: Insufficient documentation

## 2012-11-10 LAB — URINALYSIS, ROUTINE W REFLEX MICROSCOPIC
Bilirubin Urine: NEGATIVE
Glucose, UA: NEGATIVE mg/dL
Hgb urine dipstick: NEGATIVE
Ketones, ur: NEGATIVE mg/dL
Leukocytes, UA: NEGATIVE
Nitrite: NEGATIVE
Protein, ur: NEGATIVE mg/dL
Specific Gravity, Urine: 1.022 (ref 1.005–1.030)
Urobilinogen, UA: 0.2 mg/dL (ref 0.0–1.0)
pH: 5.5 (ref 5.0–8.0)

## 2012-11-10 MED ORDER — OXYCODONE-ACETAMINOPHEN 5-325 MG PO TABS: 1.0000 | ORAL_TABLET | ORAL | Status: DC | PRN

## 2012-11-10 MED ORDER — DIAZEPAM 5 MG PO TABS
10.0000 mg | ORAL_TABLET | Freq: Once | ORAL | Status: AC
  Administered 2012-11-10: 10 mg via ORAL
  Filled 2012-11-10: qty 1

## 2012-11-10 MED ORDER — HYDROMORPHONE HCL PF 1 MG/ML IJ SOLN
1.0000 mg | Freq: Once | INTRAMUSCULAR | Status: AC
  Administered 2012-11-10: 1 mg via INTRAVENOUS
  Filled 2012-11-10: qty 1

## 2012-11-10 MED ORDER — NAPROXEN 500 MG PO TABS: 500.0000 mg | ORAL_TABLET | Freq: Two times a day (BID) | ORAL | Status: DC

## 2012-11-10 MED ORDER — DIAZEPAM 5 MG PO TABS: 5.0000 mg | ORAL_TABLET | Freq: Three times a day (TID) | ORAL | Status: DC | PRN

## 2012-11-10 MED ORDER — HYDROMORPHONE HCL PF 1 MG/ML IJ SOLN
1.0000 mg | Freq: Once | INTRAMUSCULAR | Status: AC
  Administered 2012-11-10: 1 mg via INTRAMUSCULAR
  Filled 2012-11-10: qty 1

## 2012-11-10 NOTE — ED Notes (Signed)
Medication changed from IM to IV

## 2012-11-10 NOTE — ED Notes (Signed)
Pt states he is having low back pain that started about a week ago  Pt states he went to the [place] on Wed and was told he had a pinched nerve  Pt was given medication at that time but states it is not helping  Pt states the pain radiates down his right leg  Pt states the pain starts in the small of his back

## 2012-11-10 NOTE — ED Notes (Signed)
Pt states he was also told he has an enlarged prostate and he is having problems with urine flow.

## 2012-11-10 NOTE — ED Provider Notes (Signed)
Medical screening examination/treatment/procedure(s) were performed by non-physician practitioner and as supervising physician I was immediately available for consultation/collaboration.  Jasaiah Karwowski, MD 11/10/12 [id number] 

## 2012-11-10 NOTE — ED Notes (Signed)
Patient transported to X-ray 

## 2012-11-10 NOTE — ED Provider Notes (Signed)
History     CSN: [id number]  Arrival date & time 11/10/12  0535   First MD Initiated Contact with Patient 11/10/12 [phone]      Chief Complaint  Patient presents with  . Back Pain    (Consider location/radiation/quality/duration/timing/severity/associated sxs/prior treatment) Patient is a 57 y.o. male presenting with back pain. The history is provided by the patient and medical records. No language interpreter was used.  Back Pain Location:  Lumbar spine Quality:  Aching and stabbing Radiates to:  R posterior upper leg, R knee, R foot and R thigh Pain severity:  Severe Pain is:  Same all the time Onset quality:  Gradual Duration:  7 days Timing:  Constant Progression:  Unchanged Chronicity:  New Context: not emotional stress, not falling, not jumping from heights, not lifting heavy objects, not MCA, not MVA, not occupational injury, not pedestrian accident, not physical stress, not recent illness, not recent injury and not twisting   Relieved by:  Nothing Worsened by:  Lying down Ineffective treatments:  Bed rest, cold packs and NSAIDs Associated symptoms: dysuria and leg pain   Associated symptoms: no abdominal pain, no abdominal swelling, no bladder incontinence, no bowel incontinence, no chest pain, no fever, no headaches, no numbness, no paresthesias, no pelvic pain, no perianal numbness, no tingling, no weakness and no weight loss   Risk factors: no hx of cancer, no hx of osteoporosis, no lack of exercise, no menopause, not obese, no recent surgery, no steroid use and no vascular disease     Douglas Carpenter is a 57 y.o. male  with a hx of HTN, asthma, hyperlipidemia presents to the Emergency Department complaining of gradual, persistent, progressively worsening low back pain onset 1 week ago after "piddling in the yard."  Pt was evaluated at the [place] for this pain, told he had a "pinched nerve" and sent home with prednisone, tramadol, and gabapentin, none of which are helping the  pain. Associated symptoms include dysuria, right leg pain and right groin pain.  Nothing makes it better and lying flat makes it worse.  Pt states he has always needed to use pressure to urinate, but since his back began hurting, creating this pressure to urinate causes great pain in his back and groin.  Pt denies fever, chills, headache, neck pain, loss of bowel or bladder continence, numbness, tinging, weakness, known trauma, fall.  Pt denies HIV, IV drug use, or hx of cancer.       Past Medical History  Diagnosis Date  . ASTHMA 01/27/2009  . Chronic rhinitis 01/27/2009  . GERD 01/27/2009  . HYPERLIPIDEMIA 01/27/2009  . HYPERTENSION 01/27/2009  . OBSTRUCTIVE SLEEP APNEA 01/27/2009  . VOCAL CORD DISORDER 02/19/2009  . ED (erectile dysfunction)   . Testosterone deficiency     Past Surgical History  Procedure Laterality Date  . Shoulder surgery      left srthroscopic x2    Family History  Problem Relation Age of Onset  . Cancer Mother     lung  . Heart disease Maternal Grandmother   . Heart disease Maternal Grandfather   . Cancer Maternal Grandfather     colon, prostate,lung    History  Substance Use Topics  . Smoking status: Former Smoker    Quit date: 06/27/1988  . Smokeless tobacco: Never Used  . Alcohol Use: 1.5 oz/week    3 drink(s) per week      Review of Systems  Constitutional: Negative for fever, weight loss and fatigue.  HENT: Negative  for neck pain and neck stiffness.   Respiratory: Negative for chest tightness and shortness of breath.   Cardiovascular: Negative for chest pain.  Gastrointestinal: Negative for nausea, vomiting, abdominal pain, diarrhea and bowel incontinence.  Genitourinary: Positive for dysuria. Negative for bladder incontinence, urgency, frequency, hematuria and pelvic pain.  Musculoskeletal: Positive for back pain and gait problem ( 2/2 pain). Negative for joint swelling.  Skin: Negative for rash.  Neurological: Negative for tingling, weakness,  light-headedness, numbness, headaches and paresthesias.  All other systems reviewed and are negative.    Allergies  Aspirin  Home Medications   Current Outpatient Rx  Name  Route  Sig  Dispense  Refill  . aspirin EC 81 MG tablet   Oral   Take 81 mg by mouth every morning.         Marland Kitchen atenolol (TENORMIN) 25 MG tablet   Oral   Take 25 mg by mouth every morning.          Marland Kitchen atorvastatin (LIPITOR) 80 MG tablet   Oral   Take 40 mg by mouth at bedtime.         . diclofenac (VOLTAREN) 75 MG EC tablet   Oral   Take 75 mg by mouth 2 (two) times daily.           Marland Kitchen gabapentin (NEURONTIN) 300 MG capsule   Oral   Take 300 mg by mouth 3 (three) times daily.         . Omega-3 Fatty Acids (FISH OIL) 1000 MG CPDR   Oral   Take 2,000 mg by mouth 2 (two) times daily.           Marland Kitchen omeprazole (PRILOSEC) 20 MG capsule   Oral   Take 20 mg by mouth daily.           . traMADol (ULTRAM) 50 MG tablet   Oral   Take 50-100 mg by mouth every 6 (six) hours as needed for pain.         . diazepam (VALIUM) 5 MG tablet   Oral   Take 1 tablet (5 mg total) by mouth every 8 (eight) hours as needed (back pain).   15 tablet   0   . naproxen (NAPROSYN) 500 MG tablet   Oral   Take 1 tablet (500 mg total) by mouth 2 (two) times daily with a meal.   30 tablet   0   . oxyCODONE-acetaminophen (PERCOCET/ROXICET) 5-325 MG per tablet   Oral   Take 1-2 tablets by mouth every 4 (four) hours as needed for pain.   21 tablet   0   . predniSONE (STERAPRED UNI-PAK) 10 MG tablet   Oral   Take 10-30 mg by mouth See admin instructions. Take 3 tablets daily for 3 days, then 2 tablets daily for 3 days then 1 tablet daily for 3 days for nerve pain         . tamsulosin (FLOMAX) 0.4 MG CAPS   Oral   Take 0.8 mg by mouth at bedtime.         . topiramate (TOPAMAX) 25 MG tablet   Oral   Take 50 mg by mouth 2 (two) times daily.           BP 129/86  Pulse 91  Resp 20  SpO2 94%  Physical  Exam  Nursing note and vitals reviewed. Constitutional: He is oriented to person, place, and time. He appears well-developed and well-nourished. No distress.  HENT:  Head: Normocephalic and atraumatic.  Mouth/Throat: Oropharynx is clear and moist. No oropharyngeal exudate.  Eyes: Conjunctivae and EOM are normal. Pupils are equal, round, and reactive to light.  Neck: Normal range of motion. Neck supple.  Full ROM without pain  Cardiovascular: Normal rate, regular rhythm, normal heart sounds and intact distal pulses.   No murmur heard. Pulses:      Radial pulses are 2+ on the right side, and 2+ on the left side.       Dorsalis pedis pulses are 2+ on the right side, and 2+ on the left side.       Posterior tibial pulses are 2+ on the right side, and 2+ on the left side.  Pulmonary/Chest: Effort normal and breath sounds normal. No respiratory distress. He has no wheezes.  Abdominal: Soft. Bowel sounds are normal. He exhibits no distension. There is no tenderness. Hernia confirmed negative in the right inguinal area and confirmed negative in the left inguinal area.  Genitourinary: Penis normal. Rectal exam shows no external hemorrhoid, no internal hemorrhoid, no fissure, no mass, no tenderness and anal tone normal. Prostate is not enlarged and not tender. Right testis shows no mass, no swelling and no tenderness. Left testis shows no mass, no swelling and no tenderness.  Musculoskeletal:  Full range of motion of the T-spine and L-spine No tenderness to palpation of the spinous processes of the T-spine or L-spine Mild tenderness to palpation of the paraspinous muscles of the L-spine  Lymphadenopathy:    He has no cervical adenopathy.       Right: No inguinal adenopathy present.       Left: No inguinal adenopathy present.  Neurological: He is alert and oriented to person, place, and time. He has normal reflexes. No cranial nerve deficit. He exhibits normal muscle tone. Coordination normal.   Speech is clear and goal oriented, follows commands Normal strength in upper and lower extremities bilaterally including dorsiflexion and plantar flexion, strong and equal grip strength Sensation normal to light and sharp touch Moves extremities without ataxia, coordination intact Normal gait Normal balance   Skin: Skin is warm and dry. No rash noted. He is not diaphoretic. No erythema.  Psychiatric: He has a normal mood and affect.    ED Course  Procedures (including critical care time)  Labs Reviewed  URINALYSIS, ROUTINE W REFLEX MICROSCOPIC   Dg Lumbar Spine Complete  11/10/2012   *RADIOLOGY REPORT*  Clinical Data: Low back pain.  LUMBAR SPINE - COMPLETE 4+ VIEW  Comparison: 09/23/2008 MRI  Findings: Five non-rib bearing lumbar type vertebra are identified in normal alignment. There is no evidence of fracture or subluxation. Mild degenerative disc disease at L1-L2 and L2-L3 noted. There is no evidence of focal bony lesion or spondylolysis.  IMPRESSION: Mild degenerative disc disease in the upper lumbar spine.   Original Report Authenticated By: Harmon Pier, M.D.   Dg Hip Complete Right  11/10/2012   *RADIOLOGY REPORT*  Clinical Data: Pelvic and right hip pain.  RIGHT HIP - COMPLETE 2+ VIEW  Comparison: None  Findings: There is no evidence of fracture, subluxation or dislocation. The joint spaces are unremarkable. No focal bony lesions are present. No significant degenerative changes are identified.  IMPRESSION: No evidence of acute or significant abnormality.   Original Report Authenticated By: Harmon Pier, M.D.     1. Sciatica, right   2. Low back pain radiating to right leg       MDM  Douglas Carpenter presents with 1  week of back pain radiating to the right leg.  Patient with back pain.  No neurological deficits and normal neuro exam.  Patient can walk but states is painful.  No loss of bowel or bladder control.  No concern for cauda equina.  No fever, night sweats, weight loss, h/o  cancer, IVDU.  RICE protocol and pain medicine indicated and discussed with patient. Pt with hx of BPH and no boggy prostate or tenderness on exam; UA without evidence of urinary tract infection.  NO hernia on exam.  I have also discussed reasons to return immediately to the ER.  Patient expresses understanding and agrees with plan.          Dahlia Client Dmitry Macomber, PA-C 11/10/12 [phone]

## 2012-11-12 ENCOUNTER — Ambulatory Visit (INDEPENDENT_AMBULATORY_CARE_PROVIDER_SITE_OTHER): Admitting: Emergency Medicine

## 2012-11-12 VITALS — BP 130/72 | HR 73 | Temp 98.2°F | Resp 18 | Ht 67.0 in | Wt 224.0 lb

## 2012-11-12 DIAGNOSIS — M5431 Sciatica, right side: Secondary | ICD-10-CM

## 2012-11-12 DIAGNOSIS — N400 Enlarged prostate without lower urinary tract symptoms: Secondary | ICD-10-CM | POA: Insufficient documentation

## 2012-11-12 DIAGNOSIS — M5430 Sciatica, unspecified side: Secondary | ICD-10-CM

## 2012-11-12 MED ORDER — HYDROMORPHONE HCL 2 MG PO TABS: 2.0000 mg | ORAL_TABLET | ORAL | Status: DC | PRN

## 2012-11-12 NOTE — Progress Notes (Signed)
[facility] [address], [place] [address] [phone]  Date:  11/12/2012   Name:  Douglas Carpenter   DOB:  10-03-1955   MRN:  [id number]  PCP:  Rogelia Boga, MD    Chief Complaint: Back Pain   History of Present Illness:  [name] is a 57 y.o. very pleasant male patient who presents with the following:  Numerous complaints.  Was under treatment remotely for left sciatic neuritis. Had numerous epidural steroid injections.  He transiently improved.  On the 10th of this month he was doing yard work and injured his back with pain in his right buttock radiating down into his right third toe.  Pain worse when sits forcing him to walk a good bit.  Seen at the [facility] hospital and told he had a disc injury.  Had a negative LS spine film.  Put on pain medication and prednisone with no improvement.  Was seen in the ER for the same complaint and discharged him with medication.  He has received no benefit from the medication and continues to have pain despite oxycodone, prednisone, anaprox and valium.    Continues to have symptoms of prostatism despite 0.8 mg flomax.    Patient Active Problem List   Diagnosis Date Noted  . Prostatic hypertrophy 11/12/2012  . VOCAL CORD DISORDER 02/19/2009  . HYPERLIPIDEMIA 01/27/2009  . OBSTRUCTIVE SLEEP APNEA 01/27/2009  . HYPERTENSION 01/27/2009  . CHRONIC RHINITIS 01/27/2009  . ASTHMA 01/27/2009  . GERD 01/27/2009    Past Medical History  Diagnosis Date  . ASTHMA 01/27/2009  . Chronic rhinitis 01/27/2009  . GERD 01/27/2009  . HYPERLIPIDEMIA 01/27/2009  . HYPERTENSION 01/27/2009  . OBSTRUCTIVE SLEEP APNEA 01/27/2009  . VOCAL CORD DISORDER 02/19/2009  . ED (erectile dysfunction)   . Testosterone deficiency     Past Surgical History  Procedure Laterality Date  . Shoulder surgery      left srthroscopic x2    History  Substance Use Topics  . Smoking status: Former Smoker    Quit date: 06/27/1988  . Smokeless tobacco:  Never Used  . Alcohol Use: 1.5 oz/week    3 drink(s) per week    Family History  Problem Relation Age of Onset  . Cancer Mother     lung  . Heart disease Maternal Grandmother   . Heart disease Maternal Grandfather   . Cancer Maternal Grandfather     colon, prostate,lung    Allergies  Allergen Reactions  . Aspirin Rash    Pt can tolerate low doses    Medication list has been reviewed and updated.  Current Outpatient Prescriptions on File Prior to Visit  Medication Sig Dispense Refill  . aspirin EC 81 MG tablet Take 81 mg by mouth every morning.      Marland Kitchen atenolol (TENORMIN) 25 MG tablet Take 25 mg by mouth every morning.       Marland Kitchen atorvastatin (LIPITOR) 80 MG tablet Take 40 mg by mouth at bedtime.      . diazepam (VALIUM) 5 MG tablet Take 1 tablet (5 mg total) by mouth every 8 (eight) hours as needed (back pain).  15 tablet  0  . diclofenac (VOLTAREN) 75 MG EC tablet Take 75 mg by mouth 2 (two) times daily.        Marland Kitchen gabapentin (NEURONTIN) 300 MG capsule Take 300 mg by mouth 3 (three) times daily.      . naproxen (NAPROSYN) 500 MG tablet Take 1 tablet (  500 mg total) by mouth 2 (two) times daily with a meal.  30 tablet  0  . Omega-3 Fatty Acids (FISH OIL) 1000 MG CPDR Take 2,000 mg by mouth 2 (two) times daily.        Marland Kitchen omeprazole (PRILOSEC) 20 MG capsule Take 20 mg by mouth daily.        Marland Kitchen oxyCODONE-acetaminophen (PERCOCET/ROXICET) 5-325 MG per tablet Take 1-2 tablets by mouth every 4 (four) hours as needed for pain.  21 tablet  0  . predniSONE (STERAPRED UNI-PAK) 10 MG tablet Take 10-30 mg by mouth See admin instructions. Take 3 tablets daily for 3 days, then 2 tablets daily for 3 days then 1 tablet daily for 3 days for nerve pain      . tamsulosin (FLOMAX) 0.4 MG CAPS Take 0.8 mg by mouth at bedtime.      . topiramate (TOPAMAX) 25 MG tablet Take 50 mg by mouth 2 (two) times daily.      . traMADol (ULTRAM) 50 MG tablet Take 50-100 mg by mouth every 6 (six) hours as needed for pain.        No current facility-administered medications on file prior to visit.    Review of Systems:  As per HPI, otherwise negative.    Physical Examination: Filed Vitals:   11/12/12 1311  BP: 130/72  Pulse: 73  Temp: 98.2 F (36.8 C)  Resp: 18   Filed Vitals:   11/12/12 1311  Height: 5\' 7"  (1.702 m)  Weight: 224 lb (101.606 kg)   Body mass index is 35.08 kg/(m^2). Ideal Body Weight: Weight in (lb) to have BMI = 25: 159.3  GEN: WDWN, NAD, Non-toxic, A & O x 3 HEENT: Atraumatic, Normocephalic. Neck supple. No masses, No LAD. Ears and Nose: No external deformity. CV: RRR, No M/G/R. No JVD. No thrill. No extra heart sounds. PULM: CTA B, no wheezes, crackles, rhonchi. No retractions. No resp. distress. No accessory muscle use. ABD: S, NT, ND, +BS. No rebound. No HSM. EXTR: No c/c/e NEURO Normal gait.  PSYCH: Normally interactive. Conversant. Not depressed or anxious appearing.  Calm demeanor.    Assessment and Plan: BPH PSA Sciatic neuritis MRI dilaudid  Signed,  Phillips Odor, MD

## 2012-11-12 NOTE — Patient Instructions (Addendum)
Radicular Pain Radicular pain in either the arm or leg is usually from a bulging or herniated disk in the spine. A piece of the herniated disk may press against the nerves as the nerves exit the spine. This causes pain which is felt at the tips of the nerves down the arm or leg. Other causes of radicular pain may include:  Fractures.  Heart disease.  Cancer.  An abnormal and usually degenerative state of the nervous system or nerves (neuropathy). Diagnosis may require CT or MRI scanning to determine the primary cause.  Nerves that start at the neck (nerve roots) may cause radicular pain in the outer shoulder and arm. It can spread down to the thumb and fingers. The symptoms vary depending on which nerve root has been affected. In most cases radicular pain improves with conservative treatment. Neck problems may require physical therapy, a neck collar, or cervical traction. Treatment may take many weeks, and surgery may be considered if the symptoms do not improve.  Conservative treatment is also recommended for sciatica. Sciatica causes pain to radiate from the lower back or buttock area down the leg into the foot. Often there is a history of back problems. Most patients with sciatica are better after 2 to 4 weeks of rest and other supportive care. Short term bed rest can reduce the disk pressure considerably. Sitting, however, is not a good position since this increases the pressure on the disk. You should avoid bending, lifting, and all other activities which make the problem worse. Traction can be used in severe cases. Surgery is usually reserved for patients who do not improve within the first months of treatment. Only take over-the-counter or prescription medicines for pain, discomfort, or fever as directed by your caregiver. Narcotics and muscle relaxants may help by relieving more severe pain and spasm and by providing mild sedation. Cold or massage can give significant relief. Spinal manipulation  is not recommended. It can increase the degree of disc protrusion. Epidural steroid injections are often effective treatment for radicular pain. These injections deliver medicine to the spinal nerve in the space between the protective covering of the spinal cord and back bones (vertebrae). Your caregiver can give you more information about steroid injections. These injections are most effective when given within two weeks of the onset of pain.  You should see your caregiver for follow up care as recommended. A program for neck and back injury rehabilitation with stretching and strengthening exercises is an important part of management.  SEEK IMMEDIATE MEDICAL CARE IF:  You develop increased pain, weakness, or numbness in your arm or leg.  You develop difficulty with bladder or bowel control.  You develop abdominal pain. Document Released: 07/21/2004 Document Revised: 09/05/2011 Document Reviewed: 10/06/2008 [facility] Patient Information 2013 [place], [place]. Benign Prostatic Hyperplasia You have an enlarged prostate. This is common in elderly males. It is called BPH. This stands for benign prostate hyperplasia. The prostate gland is located in base of the bladder. When it grows, the prostate blocks the urethra. This is the tube which drains urine from the bladder.  SYMPTOMS  Weak urine stream.  Dribbling.  Feeling like the bladder has not emptied completely.  Difficulty starting urination.  Getting up frequently at night to urinate.  Urinating more frequently during the day. Complete urinary blockage or severe pain with urination requires immediate attention. DIAGNOSIS   Your caregiver often has a good idea what is wrong by taking a history and doing a physical exam.  Special x-rays  may be done. TREATMENT   For mild problems, no treatment may be necessary.  If the problems are moderate, medications may provide relief. Some of these work by making the prostate gland smaller. The  herb saw palmetto is commonly used.  If complete blockage occurs, a Foley catheter is usually left in place for a few days.  Surgery is often needed for more severe problems. TURP is the prostate surgery for BPH which is done through the urethra. TURP stands for transurethral resection of the prostate. It involves cutting away chips from the prostate. It is done by removing chips so that they can come out through the penis.  Techniques using heat, microwave and laser to remove the prostate blockage are also being used. HOME CARE INSTRUCTIONS   Give yourself time when you urinate.  Stay away from alcohol.  Beverages containing caffeine such as coffee, tea and colas can make the problems worse.  Decongestants, antihistamines, and some prescription medicines can also make the problem worse.  Follow up with your caregiver for further treatment as recommended. SEEK IMMEDIATE MEDICAL CARE IF:   You develop increased pain with urination or are unable to pass your water.  You develop severe abdominal pain, vomiting, a high fever, or fainting.  You develop back pain or blood in your urine. MAKE SURE YOU:   Understand these instructions.  Will watch your condition.  Will get help right away if you are not doing well or get worse. Document Released: 06/13/2005 Document Revised: 09/05/2011 Document Reviewed: 02/16/2007 [facility] Patient Information 2013 [place], [place].

## 2012-11-13 LAB — PSA: PSA: 1.79 ng/mL (ref <=4.00)

## 2012-11-17 ENCOUNTER — Ambulatory Visit
Admission: RE | Admit: 2012-11-17 | Discharge: 2012-11-17 | Disposition: A | Discharge status: Home / Self Care | Source: Ambulatory Visit | Attending: Emergency Medicine | Admitting: Emergency Medicine

## 2012-11-17 DIAGNOSIS — M5431 Sciatica, right side: Secondary | ICD-10-CM

## 2012-11-18 NOTE — Addendum Note (Signed)
Addended by: Carmelina Dane on: 11/18/2012 09:37 AM   Modules accepted: Orders

## 2012-11-21 ENCOUNTER — Telehealth: Payer: Self-pay

## 2012-11-21 NOTE — Telephone Encounter (Signed)
Calling to see if mri results were ready please call [phone] or [phone]

## 2012-11-21 NOTE — Telephone Encounter (Signed)
L4-5: There is a broad-based disc herniation more prominent towards the right with a fragment migrated upward on the right towards the foramen. This is quite likely to compress the right L4 nerve root. Patient advised, his wife advised also.

## 2012-11-23 ENCOUNTER — Telehealth: Payer: Self-pay

## 2012-11-23 NOTE — Telephone Encounter (Signed)
Just take medication and wait.Marland KitchenMarland KitchenMarland Kitchen

## 2012-11-23 NOTE — Telephone Encounter (Signed)
Pt's wife is calling asking about the referral to neuro surgery because patient is still in a lot of pain -referrals has checked with [facility] neurosurgery and the referral is still in review with them But patient is wanting to know what to do next until that appt is being made.  Best number [phone] or cell [phone]

## 2012-11-27 NOTE — Telephone Encounter (Signed)
[name] , can you check on this referral and see if it is made?

## 2012-11-28 ENCOUNTER — Encounter (HOSPITAL_COMMUNITY): Payer: Self-pay | Admitting: *Deleted

## 2012-11-28 ENCOUNTER — Other Ambulatory Visit: Payer: Self-pay | Admitting: Neurosurgery

## 2012-11-28 DIAGNOSIS — IMO0002 Reserved for concepts with insufficient information to code with codable children: Secondary | ICD-10-CM | POA: Diagnosis not present

## 2012-11-28 DIAGNOSIS — M47817 Spondylosis without myelopathy or radiculopathy, lumbosacral region: Secondary | ICD-10-CM | POA: Diagnosis not present

## 2012-11-28 DIAGNOSIS — M5126 Other intervertebral disc displacement, lumbar region: Secondary | ICD-10-CM | POA: Diagnosis not present

## 2012-11-28 MED ORDER — MUPIROCIN 2 % EX OINT
TOPICAL_OINTMENT | Freq: Once | CUTANEOUS | Status: AC
  Administered 2012-11-29: 1 via NASAL
  Filled 2012-11-28: qty 22

## 2012-11-29 ENCOUNTER — Inpatient Hospital Stay (HOSPITAL_COMMUNITY)
Admission: RE | Admit: 2012-11-29 | Discharge: 2012-11-30 | DRG: 491 | Disposition: A | Discharge status: Home / Self Care | Payer: Medicare Other | Source: Ambulatory Visit | Attending: Neurosurgery | Admitting: Neurosurgery

## 2012-11-29 ENCOUNTER — Inpatient Hospital Stay (HOSPITAL_COMMUNITY): Payer: Medicare Other | Admitting: Anesthesiology

## 2012-11-29 ENCOUNTER — Encounter (HOSPITAL_COMMUNITY): Payer: Self-pay | Admitting: *Deleted

## 2012-11-29 ENCOUNTER — Inpatient Hospital Stay (HOSPITAL_COMMUNITY): Payer: Medicare Other

## 2012-11-29 ENCOUNTER — Encounter (HOSPITAL_COMMUNITY): Payer: Self-pay | Admitting: Anesthesiology

## 2012-11-29 ENCOUNTER — Encounter (HOSPITAL_COMMUNITY)
Admission: RE | Disposition: A | Discharge status: Home / Self Care | Payer: Self-pay | Source: Ambulatory Visit | Attending: Neurosurgery

## 2012-11-29 DIAGNOSIS — J45909 Unspecified asthma, uncomplicated: Secondary | ICD-10-CM | POA: Diagnosis not present

## 2012-11-29 DIAGNOSIS — M47817 Spondylosis without myelopathy or radiculopathy, lumbosacral region: Secondary | ICD-10-CM | POA: Diagnosis not present

## 2012-11-29 DIAGNOSIS — K219 Gastro-esophageal reflux disease without esophagitis: Secondary | ICD-10-CM | POA: Diagnosis not present

## 2012-11-29 DIAGNOSIS — Z87891 Personal history of nicotine dependence: Secondary | ICD-10-CM | POA: Diagnosis not present

## 2012-11-29 DIAGNOSIS — I10 Essential (primary) hypertension: Secondary | ICD-10-CM | POA: Diagnosis not present

## 2012-11-29 DIAGNOSIS — IMO0002 Reserved for concepts with insufficient information to code with codable children: Secondary | ICD-10-CM | POA: Diagnosis not present

## 2012-11-29 DIAGNOSIS — Z79899 Other long term (current) drug therapy: Secondary | ICD-10-CM

## 2012-11-29 DIAGNOSIS — Z01818 Encounter for other preprocedural examination: Secondary | ICD-10-CM | POA: Diagnosis not present

## 2012-11-29 DIAGNOSIS — M5126 Other intervertebral disc displacement, lumbar region: Principal | ICD-10-CM | POA: Diagnosis present

## 2012-11-29 DIAGNOSIS — G4730 Sleep apnea, unspecified: Secondary | ICD-10-CM | POA: Diagnosis present

## 2012-11-29 HISTORY — PX: LUMBAR LAMINECTOMY/DECOMPRESSION MICRODISCECTOMY: SHX5026

## 2012-11-29 LAB — URINALYSIS, ROUTINE W REFLEX MICROSCOPIC
Bilirubin Urine: NEGATIVE
Glucose, UA: NEGATIVE mg/dL
Hgb urine dipstick: NEGATIVE
Ketones, ur: NEGATIVE mg/dL
Leukocytes, UA: NEGATIVE
Nitrite: NEGATIVE
Protein, ur: NEGATIVE mg/dL
Specific Gravity, Urine: 1.022 (ref 1.005–1.030)
Urobilinogen, UA: 0.2 mg/dL (ref 0.0–1.0)
pH: 6 (ref 5.0–8.0)

## 2012-11-29 LAB — BASIC METABOLIC PANEL
BUN: 23 mg/dL (ref 6–23)
CO2: 25 mEq/L (ref 19–32)
Calcium: 9.3 mg/dL (ref 8.4–10.5)
Chloride: 107 mEq/L (ref 96–112)
Creatinine, Ser: 1.06 mg/dL (ref 0.50–1.35)
GFR calc Af Amer: 88 mL/min — ABNORMAL LOW (ref >90)
GFR calc non Af Amer: 76 mL/min — ABNORMAL LOW (ref >90)
Glucose, Bld: 119 mg/dL — ABNORMAL HIGH (ref 70–99)
Potassium: 3.9 mEq/L (ref 3.5–5.1)
Sodium: 142 mEq/L (ref 135–145)

## 2012-11-29 LAB — CBC WITH DIFFERENTIAL/PLATELET
Basophils Absolute: 0 10*3/uL (ref 0.0–0.1)
Basophils Relative: 1 % (ref 0–1)
Eosinophils Absolute: 0.2 10*3/uL (ref 0.0–0.7)
Eosinophils Relative: 4 % (ref 0–5)
HCT: 41.4 % (ref 39.0–52.0)
Hemoglobin: 13.8 g/dL (ref 13.0–17.0)
Lymphocytes Relative: 34 % (ref 12–46)
Lymphs Abs: 1.3 10*3/uL (ref 0.7–4.0)
MCH: 28.9 pg (ref 26.0–34.0)
MCHC: 33.3 g/dL (ref 30.0–36.0)
MCV: 86.6 fL (ref 78.0–100.0)
Monocytes Absolute: 0.3 10*3/uL (ref 0.1–1.0)
Monocytes Relative: 7 % (ref 3–12)
Neutro Abs: 2.2 10*3/uL (ref 1.7–7.7)
Neutrophils Relative %: 55 % (ref 43–77)
Platelets: 204 10*3/uL (ref 150–400)
RBC: 4.78 MIL/uL (ref 4.22–5.81)
RDW: 13.6 % (ref 11.5–15.5)
WBC: 4 10*3/uL (ref 4.0–10.5)

## 2012-11-29 LAB — APTT: aPTT: 29 seconds (ref 24–37)

## 2012-11-29 LAB — SURGICAL PCR SCREEN
MRSA, PCR: NEGATIVE
Staphylococcus aureus: NEGATIVE

## 2012-11-29 LAB — PROTIME-INR
INR: 0.92 (ref 0.00–1.49)
Prothrombin Time: 12.3 seconds (ref 11.6–15.2)

## 2012-11-29 SURGERY — LUMBAR LAMINECTOMY/DECOMPRESSION MICRODISCECTOMY 1 LEVEL
Anesthesia: General | Site: Back | Laterality: Right | Wound class: Clean

## 2012-11-29 MED ORDER — CEFAZOLIN SODIUM 1-5 GM-% IV SOLN
1.0000 g | Freq: Three times a day (TID) | INTRAVENOUS | Status: AC
  Administered 2012-11-29 (×2): 1 g via INTRAVENOUS
  Filled 2012-11-29 (×2): qty 50

## 2012-11-29 MED ORDER — DIAZEPAM 5 MG PO TABS: 5.0000 mg | ORAL_TABLET | Freq: Three times a day (TID) | ORAL | Status: DC | PRN

## 2012-11-29 MED ORDER — LIDOCAINE HCL 4 % MT SOLN
OROMUCOSAL | Status: DC | PRN
  Administered 2012-11-29: 4 mL via TOPICAL

## 2012-11-29 MED ORDER — CEFAZOLIN SODIUM-DEXTROSE 2-3 GM-% IV SOLR
2.0000 g | INTRAVENOUS | Status: AC
  Administered 2012-11-29: 2 g via INTRAVENOUS
  Filled 2012-11-29: qty 50

## 2012-11-29 MED ORDER — METHOCARBAMOL 500 MG PO TABS
ORAL_TABLET | ORAL | Status: AC
  Filled 2012-11-29: qty 1

## 2012-11-29 MED ORDER — DEXAMETHASONE SODIUM PHOSPHATE 4 MG/ML IJ SOLN
INTRAMUSCULAR | Status: DC | PRN
  Administered 2012-11-29: 8 mg via INTRAVENOUS

## 2012-11-29 MED ORDER — PROMETHAZINE HCL 25 MG/ML IJ SOLN
12.5000 mg | INTRAMUSCULAR | Status: DC | PRN
Start: 2012-11-29 — End: 2012-11-30

## 2012-11-29 MED ORDER — ATORVASTATIN CALCIUM 40 MG PO TABS
40.0000 mg | ORAL_TABLET | Freq: Every day | ORAL | Status: DC
  Administered 2012-11-29: 40 mg via ORAL
  Filled 2012-11-29 (×2): qty 1

## 2012-11-29 MED ORDER — LIDOCAINE HCL (CARDIAC) 20 MG/ML IV SOLN
INTRAVENOUS | Status: DC | PRN
  Administered 2012-11-29: 60 mg via INTRAVENOUS

## 2012-11-29 MED ORDER — ONDANSETRON HCL 4 MG/2ML IJ SOLN
INTRAMUSCULAR | Status: DC | PRN
  Administered 2012-11-29: 4 mg via INTRAVENOUS

## 2012-11-29 MED ORDER — MORPHINE SULFATE 2 MG/ML IJ SOLN: 1.0000 mg | INTRAMUSCULAR | Status: DC | PRN

## 2012-11-29 MED ORDER — SODIUM CHLORIDE 0.9 % IR SOLN
Status: DC | PRN
  Administered 2012-11-29: 14:00:00

## 2012-11-29 MED ORDER — NEOSTIGMINE METHYLSULFATE 1 MG/ML IJ SOLN
INTRAMUSCULAR | Status: DC | PRN
  Administered 2012-11-29: 3 mg via INTRAVENOUS

## 2012-11-29 MED ORDER — OXYCODONE-ACETAMINOPHEN 5-325 MG PO TABS
1.0000 | ORAL_TABLET | ORAL | Status: DC | PRN
  Administered 2012-11-29: 2 via ORAL
  Filled 2012-11-29: qty 2

## 2012-11-29 MED ORDER — ZOLPIDEM TARTRATE 5 MG PO TABS: 5.0000 mg | ORAL_TABLET | Freq: Every evening | ORAL | Status: DC | PRN

## 2012-11-29 MED ORDER — MAGNESIUM HYDROXIDE 400 MG/5ML PO SUSP: 30.0000 mL | Freq: Every day | ORAL | Status: DC | PRN

## 2012-11-29 MED ORDER — ROCURONIUM BROMIDE 100 MG/10ML IV SOLN
INTRAVENOUS | Status: DC | PRN
  Administered 2012-11-29: 50 mg via INTRAVENOUS

## 2012-11-29 MED ORDER — ATENOLOL 25 MG PO TABS
25.0000 mg | ORAL_TABLET | Freq: Every morning | ORAL | Status: DC
  Administered 2012-11-30: 25 mg via ORAL
  Filled 2012-11-29: qty 1

## 2012-11-29 MED ORDER — OXYCODONE HCL 5 MG PO TABS
ORAL_TABLET | ORAL | Status: AC
  Filled 2012-11-29: qty 1

## 2012-11-29 MED ORDER — SODIUM CHLORIDE 0.9 % IJ SOLN
3.0000 mL | Freq: Two times a day (BID) | INTRAMUSCULAR | Status: DC
  Administered 2012-11-29: 3 mL via INTRAVENOUS

## 2012-11-29 MED ORDER — PROMETHAZINE HCL 25 MG PO TABS: 12.5000 mg | ORAL_TABLET | ORAL | Status: DC | PRN

## 2012-11-29 MED ORDER — OXYCODONE HCL 5 MG PO TABS
5.0000 mg | ORAL_TABLET | Freq: Once | ORAL | Status: AC | PRN
  Administered 2012-11-29: 5 mg via ORAL

## 2012-11-29 MED ORDER — METOCLOPRAMIDE HCL 5 MG/ML IJ SOLN: 10.0000 mg | Freq: Once | INTRAMUSCULAR | Status: DC | PRN

## 2012-11-29 MED ORDER — SODIUM CHLORIDE 0.9 % IV SOLN
INTRAVENOUS | Status: AC
  Filled 2012-11-29: qty 500

## 2012-11-29 MED ORDER — LIDOCAINE-EPINEPHRINE 1 %-1:100000 IJ SOLN
INTRAMUSCULAR | Status: DC | PRN
  Administered 2012-11-29: 20 mL

## 2012-11-29 MED ORDER — FENTANYL CITRATE 0.05 MG/ML IJ SOLN
INTRAMUSCULAR | Status: DC | PRN
  Administered 2012-11-29 (×2): 50 ug via INTRAVENOUS
  Administered 2012-11-29: 150 ug via INTRAVENOUS

## 2012-11-29 MED ORDER — HYDROMORPHONE HCL PF 1 MG/ML IJ SOLN
INTRAMUSCULAR | Status: AC
  Filled 2012-11-29: qty 1

## 2012-11-29 MED ORDER — HYDROCODONE-ACETAMINOPHEN 5-325 MG PO TABS: 1.0000 | ORAL_TABLET | ORAL | Status: DC | PRN

## 2012-11-29 MED ORDER — ACETAMINOPHEN 650 MG RE SUPP: 650.0000 mg | RECTAL | Status: DC | PRN

## 2012-11-29 MED ORDER — TAMSULOSIN HCL 0.4 MG PO CAPS
0.8000 mg | ORAL_CAPSULE | Freq: Every day | ORAL | Status: DC
  Administered 2012-11-29: 0.8 mg via ORAL
  Filled 2012-11-29 (×2): qty 2

## 2012-11-29 MED ORDER — BACITRACIN 50000 UNITS IM SOLR
INTRAMUSCULAR | Status: AC
  Filled 2012-11-29: qty 1

## 2012-11-29 MED ORDER — KETOROLAC TROMETHAMINE 30 MG/ML IJ SOLN
30.0000 mg | Freq: Four times a day (QID) | INTRAMUSCULAR | Status: DC
  Administered 2012-11-29 – 2012-11-30 (×2): 30 mg via INTRAVENOUS
  Filled 2012-11-29 (×4): qty 1

## 2012-11-29 MED ORDER — SODIUM CHLORIDE 0.9 % IV SOLN
10.0000 mg | INTRAVENOUS | Status: DC | PRN
  Administered 2012-11-29: 10 ug/min via INTRAVENOUS

## 2012-11-29 MED ORDER — CYCLOBENZAPRINE HCL 10 MG PO TABS: 10.0000 mg | ORAL_TABLET | Freq: Three times a day (TID) | ORAL | Status: DC | PRN

## 2012-11-29 MED ORDER — DOCUSATE SODIUM 100 MG PO CAPS
100.0000 mg | ORAL_CAPSULE | Freq: Two times a day (BID) | ORAL | Status: DC
  Administered 2012-11-29 – 2012-11-30 (×2): 100 mg via ORAL
  Filled 2012-11-29 (×2): qty 1

## 2012-11-29 MED ORDER — ONDANSETRON HCL 4 MG/2ML IJ SOLN: 4.0000 mg | INTRAMUSCULAR | Status: DC | PRN

## 2012-11-29 MED ORDER — GLYCOPYRROLATE 0.2 MG/ML IJ SOLN
INTRAMUSCULAR | Status: DC | PRN
  Administered 2012-11-29: 0.4 mg via INTRAVENOUS

## 2012-11-29 MED ORDER — METHOCARBAMOL 100 MG/ML IJ SOLN
500.0000 mg | Freq: Four times a day (QID) | INTRAVENOUS | Status: DC | PRN
  Filled 2012-11-29: qty 5

## 2012-11-29 MED ORDER — HEMOSTATIC AGENTS (NO CHARGE) OPTIME
TOPICAL | Status: DC | PRN
  Administered 2012-11-29: 1 via TOPICAL

## 2012-11-29 MED ORDER — METHOCARBAMOL 500 MG PO TABS
500.0000 mg | ORAL_TABLET | Freq: Four times a day (QID) | ORAL | Status: DC | PRN
  Administered 2012-11-29 (×2): 500 mg via ORAL
  Filled 2012-11-29: qty 1

## 2012-11-29 MED ORDER — HYDROMORPHONE HCL PF 1 MG/ML IJ SOLN
0.2500 mg | INTRAMUSCULAR | Status: DC | PRN
  Administered 2012-11-29 (×2): 0.5 mg via INTRAVENOUS

## 2012-11-29 MED ORDER — OXYCODONE HCL 5 MG/5ML PO SOLN
5.0000 mg | Freq: Once | ORAL | Status: AC | PRN
Start: 2012-11-29 — End: 2012-11-29

## 2012-11-29 MED ORDER — 0.9 % SODIUM CHLORIDE (POUR BTL) OPTIME
TOPICAL | Status: DC | PRN
  Administered 2012-11-29: 1000 mL

## 2012-11-29 MED ORDER — LACTATED RINGERS IV SOLN: INTRAVENOUS | Status: DC

## 2012-11-29 MED ORDER — PANTOPRAZOLE SODIUM 40 MG PO TBEC
40.0000 mg | DELAYED_RELEASE_TABLET | Freq: Every day | ORAL | Status: DC
  Administered 2012-11-30: 40 mg via ORAL
  Filled 2012-11-29: qty 1

## 2012-11-29 MED ORDER — LACTATED RINGERS IV SOLN
INTRAVENOUS | Status: DC | PRN
  Administered 2012-11-29 (×2): via INTRAVENOUS

## 2012-11-29 MED ORDER — SODIUM CHLORIDE 0.9 % IJ SOLN: 3.0000 mL | INTRAMUSCULAR | Status: DC | PRN

## 2012-11-29 MED ORDER — ACETAMINOPHEN 325 MG PO TABS: 650.0000 mg | ORAL_TABLET | ORAL | Status: DC | PRN

## 2012-11-29 MED ORDER — MIDAZOLAM HCL 5 MG/5ML IJ SOLN
INTRAMUSCULAR | Status: DC | PRN
  Administered 2012-11-29: 2 mg via INTRAVENOUS

## 2012-11-29 MED ORDER — PROPOFOL 10 MG/ML IV BOLUS
INTRAVENOUS | Status: DC | PRN
  Administered 2012-11-29: 200 mg via INTRAVENOUS

## 2012-11-29 MED ORDER — THROMBIN 5000 UNITS EX SOLR
CUTANEOUS | Status: DC | PRN
  Administered 2012-11-29 (×2): 5000 [IU] via TOPICAL

## 2012-11-29 MED ORDER — PHENYLEPHRINE HCL 10 MG/ML IJ SOLN
INTRAMUSCULAR | Status: DC | PRN
  Administered 2012-11-29 (×2): 80 ug via INTRAVENOUS
  Administered 2012-11-29: 40 ug via INTRAVENOUS
  Administered 2012-11-29: 120 ug via INTRAVENOUS
  Administered 2012-11-29 (×2): 80 ug via INTRAVENOUS

## 2012-11-29 SURGICAL SUPPLY — 53 items
BAG DECANTER FOR FLEXI CONT (MISCELLANEOUS) ×2 IMPLANT
BENZOIN TINCTURE PRP APPL 2/3 (GAUZE/BANDAGES/DRESSINGS) ×2 IMPLANT
BLADE SURG ROTATE 9660 (MISCELLANEOUS) IMPLANT
BUR ROUND FLUTED 5 RND (BURR) ×2 IMPLANT
CANISTER SUCTION 2500CC (MISCELLANEOUS) ×2 IMPLANT
CLOTH BEACON ORANGE TIMEOUT ST (SAFETY) ×2 IMPLANT
CONT SPEC 4OZ CLIKSEAL STRL BL (MISCELLANEOUS) IMPLANT
DRAPE LAPAROTOMY 100X72X124 (DRAPES) ×2 IMPLANT
DRAPE MICROSCOPE LEICA (MISCELLANEOUS) ×2 IMPLANT
DRAPE POUCH INSTRU U-SHP 10X18 (DRAPES) ×2 IMPLANT
DRAPE SURG 17X23 STRL (DRAPES) ×2 IMPLANT
DRESSING TELFA 8X3 (GAUZE/BANDAGES/DRESSINGS) ×2 IMPLANT
DURAPREP 26ML APPLICATOR (WOUND CARE) ×2 IMPLANT
ELECT REM PT RETURN 9FT ADLT (ELECTROSURGICAL) ×2
ELECTRODE REM PT RTRN 9FT ADLT (ELECTROSURGICAL) ×1 IMPLANT
GAUZE SPONGE 4X4 16PLY XRAY LF (GAUZE/BANDAGES/DRESSINGS) IMPLANT
GLOVE BIO SURGEON STRL SZ8 (GLOVE) ×2 IMPLANT
GLOVE BIOGEL PI IND STRL 7.0 (GLOVE) ×1 IMPLANT
GLOVE BIOGEL PI INDICATOR 7.0 (GLOVE) ×1
GLOVE ECLIPSE 7.5 STRL STRAW (GLOVE) ×2 IMPLANT
GLOVE EXAM NITRILE LRG STRL (GLOVE) IMPLANT
GLOVE EXAM NITRILE MD LF STRL (GLOVE) IMPLANT
GLOVE EXAM NITRILE XL STR (GLOVE) IMPLANT
GLOVE EXAM NITRILE XS STR PU (GLOVE) IMPLANT
GLOVE SS BIOGEL STRL SZ 6.5 (GLOVE) ×2 IMPLANT
GLOVE SUPERSENSE BIOGEL SZ 6.5 (GLOVE) ×2
GOWN BRE IMP SLV AUR LG STRL (GOWN DISPOSABLE) ×2 IMPLANT
GOWN BRE IMP SLV AUR XL STRL (GOWN DISPOSABLE) ×4 IMPLANT
GOWN STRL REIN 2XL LVL4 (GOWN DISPOSABLE) IMPLANT
KIT BASIN OR (CUSTOM PROCEDURE TRAY) ×2 IMPLANT
KIT ROOM TURNOVER OR (KITS) ×2 IMPLANT
NEEDLE HYPO 18GX1.5 BLUNT FILL (NEEDLE) IMPLANT
NEEDLE HYPO 22GX1.5 SAFETY (NEEDLE) ×4 IMPLANT
NS IRRIG 1000ML POUR BTL (IV SOLUTION) ×2 IMPLANT
PACK LAMINECTOMY NEURO (CUSTOM PROCEDURE TRAY) ×2 IMPLANT
PAD ARMBOARD 7.5X6 YLW CONV (MISCELLANEOUS) ×6 IMPLANT
PATTIES SURGICAL .75X.75 (GAUZE/BANDAGES/DRESSINGS) ×2 IMPLANT
RUBBERBAND STERILE (MISCELLANEOUS) ×4 IMPLANT
SPONGE GAUZE 4X4 12PLY (GAUZE/BANDAGES/DRESSINGS) ×2 IMPLANT
SPONGE LAP 4X18 X RAY DECT (DISPOSABLE) IMPLANT
SPONGE SURGIFOAM ABS GEL SZ50 (HEMOSTASIS) ×2 IMPLANT
STRIP CLOSURE SKIN 1/2X4 (GAUZE/BANDAGES/DRESSINGS) ×2 IMPLANT
SUT PROLENE 6 0 BV (SUTURE) IMPLANT
SUT VIC AB 0 CT1 18XCR BRD8 (SUTURE) ×1 IMPLANT
SUT VIC AB 0 CT1 8-18 (SUTURE) ×1
SUT VIC AB 2-0 CP2 18 (SUTURE) ×2 IMPLANT
SUT VIC AB 3-0 SH 8-18 (SUTURE) ×2 IMPLANT
SYR 20ML ECCENTRIC (SYRINGE) ×2 IMPLANT
SYR 5ML LL (SYRINGE) IMPLANT
TAPE CLOTH SURG 4X10 WHT LF (GAUZE/BANDAGES/DRESSINGS) ×2 IMPLANT
TOWEL OR 17X24 6PK STRL BLUE (TOWEL DISPOSABLE) ×2 IMPLANT
TOWEL OR 17X26 10 PK STRL BLUE (TOWEL DISPOSABLE) ×2 IMPLANT
WATER STERILE IRR 1000ML POUR (IV SOLUTION) ×2 IMPLANT

## 2012-11-29 NOTE — Op Note (Signed)
11/29/2012  2:53 PM  PATIENT:  Douglas Carpenter  57 y.o. male  PRE-OPERATIVE DIAGNOSIS:  Lumbar hnp without myelopathy, Lumbar spondylosis, Lumbar radiculopathy, right L4-5  POST-OPERATIVE DIAGNOSIS:  Lumbar hnp without myelopathy, Lumbar spondylosis, Lumbar radiculopathy,  right L4-5   PROCEDURE:  Procedure(s): Right Lumbar Four to Five Laminectomy/ Decompression Microdiskectomy,  microdisection  SURGEON:  Surgeon(s): Clydene Fake, MD    ANESTHESIA:   general  EBL:  Total I/O In: 1000 [I.V.:1000] Out: 200 [Blood:200]  BLOOD ADMINISTERED:none  DRAINS: none   SPECIMEN:  No Specimen  DICTATION: Back and right leg pain numbness and weakness. MRI was done showing disc herniation right L4-5 with a fragment going cephalad up under the 4 root. After discussion was decided proceed with surgery for lamina discectomy.  Patient brought in from general anesthesia induced patient placed in prone position Wilson frame all pressure points padded. Patient prepped draped sterile fashion segments inject with 20 cc 1% lidocaine with epinephrine. Needle was placed interspace x-rays attention needle was putting at 3 for space incision was made centered below with a needle was incision taken of the fascia hemostasis obtained with position the right side and subperiosteal dissection done of L4 and 5 spinous process and lamina out to the facet. Soaking tractors placed markers placed interspace another x-rays obtained confirming or positioning at L4-5. Microscope was brought in for microdissection this point high-speed drill was used to starting semi-hemilaminectomy medial facetectomy is completed with Kerrison punches flavum plate was removed we explored the epidural space explore the disc space there is Kendal Hymen disc protrusion narrowing and we looked more cephalad using a variety of hooks microembolic hooks and we found a huge fragment of disc up under the IV nerve roots we are to mobilize and remove this  decompressed the nerve root. We then looked back at the disc space and incised the displacement discectomy done with pituitary rongeurs and curettes and we did remove a lot of the disc [place] tear out laterally side. We continued the discectomy and were finished we did decompression the central canal and the 4 and V nerve roots. We hemostasis with Gelfoam and thrombin. Good hemostasis we. About solution and the retractors removed fascia closed with 0 Vicryl interrupted sutures subcutaneous tissue closed with 021 through Vicryl interrupted sutures skin closed benzoin Steri-Strips dressing was placed patient placed back in supine position woken (and transferred to recovery.  PLAN OF CARE: Admit to inpatient   PATIENT DISPOSITION:  PACU - hemodynamically stable.

## 2012-11-29 NOTE — Anesthesia Procedure Notes (Signed)
Procedure Name: Intubation Date/Time: 11/29/2012 1:12 PM Performed by: Sharlene Dory E Pre-anesthesia Checklist: Patient identified, Emergency Drugs available, Suction available, Patient being monitored and Timeout performed Patient Re-evaluated:Patient Re-evaluated prior to inductionOxygen Delivery Method: Circle system utilized Preoxygenation: Pre-oxygenation with 100% oxygen Intubation Type: IV induction Ventilation: Mask ventilation without difficulty Laryngoscope Size: Mac and 3 Grade View: Grade II Tube type: Oral Tube size: 7.5 mm Number of attempts: 1 Airway Equipment and Method: Stylet and LTA kit utilized Placement Confirmation: ETT inserted through vocal cords under direct vision,  positive ETCO2 and breath sounds checked- equal and bilateral Secured at: 23 cm Tube secured with: Tape Dental Injury: Teeth and Oropharynx as per pre-operative assessment

## 2012-11-29 NOTE — H&P (Signed)
See H& P.

## 2012-11-29 NOTE — Transfer of Care (Signed)
Immediate Anesthesia Transfer of Care Note  Patient: Douglas Carpenter  Procedure(s) Performed: Procedure(s) with comments: Right Lumbar Four to Five Laminectomy/ Decompression Microdiskectomy (Right) - LUMBAR LAMINECTOMY/DECOMPRESSION MICRODISCECTOMY 1 LEVEL  Patient Location: PACU  Anesthesia Type:General  Level of Consciousness: awake, alert  and oriented  Airway & Oxygen Therapy: Patient Spontanous Breathing and Patient connected to nasal cannula oxygen  Post-op Assessment: Report given to PACU RN, Post -op Vital signs reviewed and stable and Patient moving all extremities X 4  Post vital signs: Reviewed and stable  Complications: No apparent anesthesia complications

## 2012-11-29 NOTE — Preoperative (Signed)
Beta Blockers   Reason not to administer Beta Blockers:Not Applicable 

## 2012-11-29 NOTE — Plan of Care (Signed)
Problem: Consults Goal: Diagnosis - Spinal Surgery Outcome: Completed/Met Date Met:  11/29/12 Lumbar Laminectomy (Complex)

## 2012-11-29 NOTE — Anesthesia Preprocedure Evaluation (Addendum)
Anesthesia Evaluation  Patient identified by MRN, date of birth, ID band Patient awake    Reviewed: Allergy & Precautions, H&P , NPO status , Patient's Chart, lab work & pertinent test results, reviewed documented beta blocker date and time   Airway Mallampati: II TM Distance: >3 FB Neck ROM: full    Dental  (+) Teeth Intact   Pulmonary asthma , sleep apnea , former smoker,  breath sounds clear to auscultation        Cardiovascular hypertension, On Medications and On Home Beta Blockers Rhythm:regular     Neuro/Psych negative neurological ROS  negative psych ROS   GI/Hepatic Neg liver ROS, GERD-  Medicated and Controlled,  Endo/Other  negative endocrine ROS  Renal/GU negative Renal ROS  negative genitourinary   Musculoskeletal   Abdominal   Peds  Hematology negative hematology ROS (+)   Anesthesia Other Findings See surgeon's H&P   Reproductive/Obstetrics negative OB ROS                          Anesthesia Physical Anesthesia Plan  ASA: III  Anesthesia Plan: General   Post-op Pain Management:    Induction: Intravenous  Airway Management Planned: Oral ETT  Additional Equipment:   Intra-op Plan:   Post-operative Plan: Extubation in OR  Informed Consent: I have reviewed the patients History and Physical, chart, labs and discussed the procedure including the risks, benefits and alternatives for the proposed anesthesia with the patient or authorized representative who has indicated his/her understanding and acceptance.   Dental Advisory Given  Plan Discussed with: CRNA and Surgeon  Anesthesia Plan Comments:         Anesthesia Quick Evaluation

## 2012-11-29 NOTE — Anesthesia Postprocedure Evaluation (Signed)
Anesthesia Post Note  Patient: Douglas Carpenter  Procedure(s) Performed: Procedure(s) (LRB): Right Lumbar Four to Five Laminectomy/ Decompression Microdiskectomy (Right)  Anesthesia type: General  Patient location: PACU  Post pain: Pain level controlled  Post assessment: Patient's Cardiovascular Status Stable  Last Vitals:  Filed Vitals:   11/29/12 1936  BP: 116/77  Pulse: 84  Temp: 37.2 C  Resp: 16    Post vital signs: Reviewed and stable  Level of consciousness: alert  Complications: No apparent anesthesia complications

## 2012-11-29 NOTE — Interval H&P Note (Signed)
History and Physical Interval Note:  11/29/2012 7:58 AM  Douglas Carpenter  has presented today for surgery, with the diagnosis of Lumbar hnp without myelopathy, Lumbar spondylosis, Lumbar radiculopathy  The various methods of treatment have been discussed with the patient and family. After consideration of risks, benefits and other options for treatment, the patient has consented to  Procedure(s) with comments: LUMBAR LAMINECTOMY/DECOMPRESSION MICRODISCECTOMY 1 LEVEL (Right) - Right L4-5 Microdiskectomy as a surgical intervention .  The patient's history has been reviewed, patient examined, no change in status, stable for surgery.  I have reviewed the patient's chart and labs.  Questions were answered to the patient's satisfaction.     Kimoni Pagliarulo R

## 2012-11-29 NOTE — Interval H&P Note (Signed)
History and Physical Interval Note:  11/29/2012 7:58 AM  Douglas Carpenter  has presented today for surgery, with the diagnosis of Lumbar hnp without myelopathy, Lumbar spondylosis, Lumbar radiculopathy  The various methods of treatment have been discussed with the patient and family. After consideration of risks, benefits and other options for treatment, the patient has consented to  Procedure(s) with comments: LUMBAR LAMINECTOMY/DECOMPRESSION MICRODISCECTOMY 1 LEVEL (Right) - Right L4-5 Microdiskectomy as a surgical intervention .  The patient's history has been reviewed, patient examined, no change in status, stable for surgery.  I have reviewed the patient's chart and labs.  Questions were answered to the patient's satisfaction.     Lynnley Doddridge R   

## 2012-11-30 ENCOUNTER — Encounter (HOSPITAL_COMMUNITY): Payer: Self-pay | Admitting: Neurosurgery

## 2012-11-30 MED ORDER — CYCLOBENZAPRINE HCL 10 MG PO TABS: 10.0000 mg | ORAL_TABLET | Freq: Three times a day (TID) | ORAL | Status: DC | PRN

## 2012-11-30 MED ORDER — OXYCODONE-ACETAMINOPHEN 5-325 MG PO TABS: 1.0000 | ORAL_TABLET | ORAL | Status: DC | PRN

## 2012-11-30 MED ORDER — PNEUMOCOCCAL VAC POLYVALENT 25 MCG/0.5ML IJ INJ
0.5000 mL | INJECTION | INTRAMUSCULAR | Status: AC
  Administered 2012-11-30: 0.5 mL via INTRAMUSCULAR
  Filled 2012-11-30: qty 0.5

## 2012-11-30 NOTE — Discharge Summary (Signed)
Physician Discharge Summary  Patient ID: Douglas Carpenter MRN: [id number] DOB/AGE: [birth year] 57 y.o.  Admit date: 11/29/2012 Discharge date: 11/30/2012  Admission Diagnoses:Lumbar hnp without myelopathy, Lumbar spondylosis, Lumbar radiculopathy, right L4-5      Discharge Diagnoses: Lumbar hnp without myelopathy, Lumbar spondylosis, Lumbar radiculopathy, right L4-5     Active Problems:   * No active hospital problems. *   Discharged Condition: good  Hospital Course: pt admitted on day of surgery  - underwent procedure below  - pt with no leg pain,numbness, ambulating, voiding      Treatments: surgery:PROCEDURE: Procedure(s):  Right Lumbar Four to Five Laminectomy/ Decompression Microdiskectomy, microdisection   Discharge Exam: Blood pressure 112/70, pulse 77, temperature 97.5 F (36.4 C), temperature source Oral, resp. rate 18, SpO2 97.00%. Wound:c/d/i  Disposition: home     Medication List    TAKE these medications       aspirin EC 81 MG tablet  Take 81 mg by mouth every morning.     atenolol 25 MG tablet  Commonly known as:  TENORMIN  Take 25 mg by mouth every morning.     atorvastatin 80 MG tablet  Commonly known as:  LIPITOR  Take 40 mg by mouth at bedtime.     cyclobenzaprine 10 MG tablet  Commonly known as:  FLEXERIL  Take 1 tablet (10 mg total) by mouth 3 (three) times daily as needed for muscle spasms.     diazepam 5 MG tablet  Commonly known as:  VALIUM  Take 1 tablet (5 mg total) by mouth every 8 (eight) hours as needed (back pain).     diclofenac 75 MG EC tablet  Commonly known as:  VOLTAREN  Take 75 mg by mouth 2 (two) times daily.     Fish Oil 1000 MG Cpdr  Take 2,000 mg by mouth 2 (two) times daily.     omeprazole 20 MG capsule  Commonly known as:  PRILOSEC  Take 20 mg by mouth daily.     oxyCODONE-acetaminophen 5-325 MG per tablet  Commonly known as:  PERCOCET/ROXICET  Take 1-2 tablets by mouth every 4 (four) hours as needed for  pain.     predniSONE 10 MG tablet  Commonly known as:  STERAPRED UNI-PAK  Take 10-30 mg by mouth See admin instructions. Take 3 tablets daily for 3 days, then 2 tablets daily for 3 days then 1 tablet daily for 3 days for nerve pain     tamsulosin 0.4 MG Caps  Commonly known as:  FLOMAX  Take 0.8 mg by mouth at bedtime.     topiramate 25 MG tablet  Commonly known as:  TOPAMAX  Take 25 mg by mouth daily.         SignedClydene Fake, MD 11/30/2012, 8:30 AM

## 2012-11-30 NOTE — Progress Notes (Addendum)
Pt. discharged home accompanied by spouse. Prescriptions and discharge instructions given with verbalization of understanding. Incision site on back with no s/s of infection - no swelling, redness, bleeding, and/or drainage noted.  Opportunity given to ask questions but no question asked. Pt. transported out of this unit in wheelchair by volunteer

## 2012-12-25 DIAGNOSIS — N35919 Unspecified urethral stricture, male, unspecified site: Secondary | ICD-10-CM | POA: Diagnosis not present

## 2012-12-25 DIAGNOSIS — N3941 Urge incontinence: Secondary | ICD-10-CM | POA: Diagnosis not present

## 2012-12-31 DIAGNOSIS — N35919 Unspecified urethral stricture, male, unspecified site: Secondary | ICD-10-CM | POA: Diagnosis not present

## 2012-12-31 DIAGNOSIS — N3941 Urge incontinence: Secondary | ICD-10-CM | POA: Diagnosis not present

## 2012-12-31 DIAGNOSIS — N401 Enlarged prostate with lower urinary tract symptoms: Secondary | ICD-10-CM | POA: Diagnosis not present

## 2013-01-01 ENCOUNTER — Other Ambulatory Visit: Payer: Self-pay | Admitting: Urology

## 2013-01-02 ENCOUNTER — Encounter (HOSPITAL_BASED_OUTPATIENT_CLINIC_OR_DEPARTMENT_OTHER): Payer: Self-pay | Admitting: *Deleted

## 2013-01-03 ENCOUNTER — Encounter (HOSPITAL_BASED_OUTPATIENT_CLINIC_OR_DEPARTMENT_OTHER): Payer: Self-pay | Admitting: *Deleted

## 2013-01-03 NOTE — Progress Notes (Signed)
NPO AFTER MN. ARRIVES AT 1100. NEEDS ISTAT. CURRENT EKG IN EPIC AND  CHART. WILL TAKE ATENOLOL AND PRILOSEC AM OF SURG W/ SIP OF WATER.

## 2013-01-04 NOTE — H&P (Signed)
History of Present Illness      Douglas Carpenter is currently 57 years of age.   He has had some very longstanding and progressive voiding complaints.  He has received most of his care at the [place]. He apparently has been tried on some empiric courses of antibiotics in the past and more recently has been on generic Flomax.  Current dose is 0.8 mg at bedtime.  Despite this, he has not really noticed a substantial improvement in his voiding and he continues to have considerable obstructive and irritative symptoms.  Nocturia can be as much as 4-5 times per evening.  He has urgency with occasional urge leakage and also has significant obstructive symptoms with weak stream and incomplete emptying.  PSA done recently was normal at 1.8.  His current AUA symptom score is 33 with a bothersome score of 6.  There is no family history of any prostate cancer.  The patient underwent recent urodynamic study. Steward Drone was unable to place a urodynamics catheter. I performed flexible cystoscopy and saw evidence of a bulbar urethral stricture with a pulse passage. I was able to guide a urodynamics catheter into the bladder. I was able to get the scope through that area without additional dilation. At the time of urodynamics the patient's first sensation occurred at 230 mL. The bladder showed instability occurring at approximately 230 mL. Maximum unstable bladder contraction pressure was approximately 30 cm water pressure but no leakage occurred and the patient was able to inhibit these. A pressure flow study the patient was able to void 138 mL. Maximum flow was 11 mL per second with a detrusor pressure at maximum flow of 41 cm water pressure. Postvoid residual is approximately 30 mL. The pressure flow values put him in a equivocal category for obstruction. He reported that his flow during the study was much better than is typically suggesting that again some of this may have been secondary to urethral stricture disease. The bladder did show  significant trabeculation as well as some diverticuli.    Past Medical History Problems  1. History of  Arthritis V13.4 2. History of  Asthma 493.90 3. History of  Heartburn 787.1 4. History of  Hypercholesterolemia 272.0 5. History of  Hypertension 401.9 6. History of  Sleep Apnea 780.57  Surgical History Problems  1. History of  Shoulder Surgery  Current Meds 1. Aspirin 81 MG Oral Tablet; Therapy: (Recorded:22May2014) to 2. Atenolol 25 MG Oral Tablet; Therapy: (Recorded:22May2014) to 3. Atorvastatin Calcium 80 MG Oral Tablet; Therapy: (Recorded:22May2014) to 4. Diazepam 5 MG Oral Tablet; Therapy: (Recorded:22May2014) to 5. Diclofenac Sodium 75 MG Oral Tablet Delayed Release; Therapy: (Recorded:22May2014) to 6. Fish Oil 1000 MG Oral Capsule; Therapy: (Recorded:22May2014) to 7. HYDROmorphone HCl 2 MG Oral Tablet; Therapy: (Recorded:22May2014) to 8. Naproxen 500 MG Oral Tablet; Therapy: (Recorded:22May2014) to 9. Omeprazole 20 MG Oral Capsule Delayed Release; Therapy: (Recorded:22May2014) to 10. Oxycodone-Acetaminophen 5-325 MG Oral Tablet; Therapy: (Recorded:22May2014) to 11. PredniSONE 10 MG Oral Tablet; Therapy: (Recorded:22May2014) to 12. Tamsulosin HCl 0.4 MG Oral Capsule; Therapy: (Recorded:22May2014) to  Allergies Medication  1. Aspirin Low Dose TABS  Family History Problems  1. Paternal history of  Death In The Family Father 40yrs, in service [place] 2. Maternal history of  Death In The Family Mother 63yrs, lung ca 3. Family history of  Family Health Status Number Of Children 1 son and 4 daughters 4. Maternal history of  Lung Cancer V16.1 5. Family history of  Prostate Cancer V16.42  Social History Problems  1. Being A Social Drinker 2. Caffeine Use 2-3 qd 3. Former Smoker V15.82 1ppd, but quit 46yrs ago 4. Marital History - Currently Married 5. Occupation: retired  Review of Systems Genitourinary, constitutional, skin, eye, otolaryngeal,  hematologic/lymphatic, cardiovascular, pulmonary, endocrine, musculoskeletal, gastrointestinal, neurological and psychiatric system(s) were reviewed and pertinent findings if present are noted.  Genitourinary: urinary frequency, feelings of urinary urgency, nocturia, incontinence, difficulty starting the urinary stream, weak urinary stream, urinary stream starts and stops, incomplete emptying of bladder and initiating urination requires straining, but no hematuria.  Constitutional: feeling tired (fatigue).  Musculoskeletal: back pain.  Neurological: headache.    Vitals Vital Signs [Data Includes: Last 1 Day]  07Jul2014 01:52PM  Blood Pressure: 121 / 80 Temperature: 98.1 F Heart Rate: 83  Physical Exam Constitutional: Well nourished and well developed . No acute distress.  ENT:. The ears and nose are normal in appearance.  Neck: The appearance of the neck is normal and no neck mass is present.  Pulmonary: No respiratory distress and normal respiratory rhythm and effort.  Cardiovascular: Heart rate and rhythm are normal . No peripheral edema.  Abdomen: The abdomen is soft and nontender. No masses are palpated. No CVA tenderness. No hernias are palpable. No hepatosplenomegaly noted.  Rectal: Rectal exam demonstrates normal sphincter tone, no tenderness and no masses. Estimated prostate size is 1+. Normal rectal tone, no rectal masses, prostate is smooth, symmetric and non-tender. The prostate has no nodularity and is not tender. The left seminal vesicle is nonpalpable. The right seminal vesicle is nonpalpable. The perineum is normal on inspection.  Genitourinary: Examination of the penis demonstrates no discharge, no masses, no lesions and a normal meatus. The scrotum is without lesions. The right epididymis is palpably normal and non-tender. The left epididymis is palpably normal and non-tender. The right testis is non-tender and without masses. The left testis is non-tender and without masses.   Lymphatics: The femoral and inguinal nodes are not enlarged or tender.  Skin: Normal skin turgor, no visible rash and no visible skin lesions.  Neuro/Psych:. Mood and affect are appropriate.   Results/Data Urine [Data Includes: Last 1 Day]   07Jul2014 COLOR YELLOW  APPEARANCE CLEAR  SPECIFIC GRAVITY 1.025  pH 6.0  GLUCOSE NEG mg/dL BILIRUBIN NEG  KETONE NEG mg/dL BLOOD TRACE  PROTEIN NEG mg/dL UROBILINOGEN 0.2 mg/dL NITRITE NEG  LEUKOCYTE ESTERASE NEG  SQUAMOUS EPITHELIAL/HPF FEW  WBC 0-2 WBC/hpf RBC 3-6 RBC/hpf BACTERIA NONE SEEN  CRYSTALS NONE SEEN  CASTS NONE SEEN   Assessment Assessed  1. Urge Incontinence Of Urine 788.31 2. Urethral Stricture 598.9 3. Benign Prostatic Hypertrophy With Urinary Obstruction 600.01  Plan Benign Prostatic Hypertrophy With Urinary Obstruction (600.01)  1. Follow-up Schedule Surgery Office  Follow-up  Requested for: 07Jul2014 Health Maintenance (V70.0)  2. UA With REFLEX  Done: 07Jul2014 01:39PM  Discussion/Summary   Mr. Arther has some longstanding voiding issues that presumptively had been secondary to BPH and bladder neck obstruction. He, however, got really no benefit from alpha-blocker therapy and clearly had evidence of bulbar urethral stricture disease. He did have an injury that may have caused this several years ago. He did notice that after urodynamics and cystoscopy, that his stream was markedly improved at least temporarily. It is, again, started to slow down. Certainly it would be wrong to recommend any type of prostate surgery at this point. I have suggested that we bring him downstairs and perform repeat cystoscopy with careful and gentle balloon dilation of his urethral stricture to see  if we can get further and more permanent improvement. He understands that the biggest problem with treatment for urethral stricture is recurrence. Obviously, if he gets improvement and then recurs, we can talk about more permanent options. I am  hopeful that just treatment of the urethral stricture will result in enough improvement in his overall voiding that we will not have to do anything else. We will set this up for sometime in the next few weeks.    Signatures Electronically signed by : Barron Alvine, M.D.; Dec 31 2012  2:47PM

## 2013-01-07 ENCOUNTER — Encounter (HOSPITAL_BASED_OUTPATIENT_CLINIC_OR_DEPARTMENT_OTHER): Payer: Self-pay

## 2013-01-07 ENCOUNTER — Ambulatory Visit (HOSPITAL_BASED_OUTPATIENT_CLINIC_OR_DEPARTMENT_OTHER): Payer: Medicare Other | Admitting: Anesthesiology

## 2013-01-07 ENCOUNTER — Encounter (HOSPITAL_BASED_OUTPATIENT_CLINIC_OR_DEPARTMENT_OTHER)
Admission: RE | Disposition: A | Discharge status: Home / Self Care | Payer: Self-pay | Source: Ambulatory Visit | Attending: Urology

## 2013-01-07 ENCOUNTER — Encounter (HOSPITAL_BASED_OUTPATIENT_CLINIC_OR_DEPARTMENT_OTHER): Payer: Self-pay | Admitting: Anesthesiology

## 2013-01-07 ENCOUNTER — Ambulatory Visit (HOSPITAL_COMMUNITY): Payer: Medicare Other

## 2013-01-07 ENCOUNTER — Ambulatory Visit (HOSPITAL_BASED_OUTPATIENT_CLINIC_OR_DEPARTMENT_OTHER)
Admission: RE | Admit: 2013-01-07 | Discharge: 2013-01-07 | Disposition: A | Discharge status: Home / Self Care | Payer: Medicare Other | Source: Ambulatory Visit | Attending: Urology | Admitting: Urology

## 2013-01-07 DIAGNOSIS — N401 Enlarged prostate with lower urinary tract symptoms: Secondary | ICD-10-CM | POA: Diagnosis not present

## 2013-01-07 DIAGNOSIS — E7800 Pure hypercholesterolemia, unspecified: Secondary | ICD-10-CM | POA: Diagnosis not present

## 2013-01-07 DIAGNOSIS — R3915 Urgency of urination: Secondary | ICD-10-CM | POA: Diagnosis not present

## 2013-01-07 DIAGNOSIS — R12 Heartburn: Secondary | ICD-10-CM | POA: Diagnosis not present

## 2013-01-07 DIAGNOSIS — Z7982 Long term (current) use of aspirin: Secondary | ICD-10-CM | POA: Diagnosis not present

## 2013-01-07 DIAGNOSIS — I10 Essential (primary) hypertension: Secondary | ICD-10-CM | POA: Insufficient documentation

## 2013-01-07 DIAGNOSIS — G4730 Sleep apnea, unspecified: Secondary | ICD-10-CM | POA: Diagnosis not present

## 2013-01-07 DIAGNOSIS — J45909 Unspecified asthma, uncomplicated: Secondary | ICD-10-CM | POA: Insufficient documentation

## 2013-01-07 DIAGNOSIS — R351 Nocturia: Secondary | ICD-10-CM | POA: Diagnosis not present

## 2013-01-07 DIAGNOSIS — N35919 Unspecified urethral stricture, male, unspecified site: Secondary | ICD-10-CM

## 2013-01-07 DIAGNOSIS — Z79899 Other long term (current) drug therapy: Secondary | ICD-10-CM | POA: Insufficient documentation

## 2013-01-07 DIAGNOSIS — N138 Other obstructive and reflux uropathy: Secondary | ICD-10-CM | POA: Insufficient documentation

## 2013-01-07 DIAGNOSIS — IMO0002 Reserved for concepts with insufficient information to code with codable children: Secondary | ICD-10-CM | POA: Diagnosis not present

## 2013-01-07 HISTORY — DX: Gastro-esophageal reflux disease without esophagitis: K21.9

## 2013-01-07 HISTORY — DX: Obstructive sleep apnea (adult) (pediatric): G47.33

## 2013-01-07 HISTORY — DX: Other congenital malformations of larynx: Q31.8

## 2013-01-07 HISTORY — PX: CYSTOSCOPY WITH URETHRAL DILATATION: SHX5125

## 2013-01-07 HISTORY — DX: Benign prostatic hyperplasia without lower urinary tract symptoms: N40.0

## 2013-01-07 HISTORY — DX: Unspecified asthma, uncomplicated: J45.909

## 2013-01-07 HISTORY — DX: Unspecified urethral stricture, male, unspecified site: N35.919

## 2013-01-07 LAB — POCT I-STAT 4, (NA,K, GLUC, HGB,HCT)
Glucose, Bld: 119 mg/dL — ABNORMAL HIGH (ref 70–99)
HCT: 41 % (ref 39.0–52.0)
Hemoglobin: 13.9 g/dL (ref 13.0–17.0)
Potassium: 3.8 mEq/L (ref 3.5–5.1)
Sodium: 141 mEq/L (ref 135–145)

## 2013-01-07 SURGERY — CYSTOSCOPY, WITH URETHRAL DILATION
Anesthesia: General | Site: Ureter | Wound class: Clean Contaminated

## 2013-01-07 MED ORDER — CIPROFLOXACIN IN D5W 400 MG/200ML IV SOLN
400.0000 mg | INTRAVENOUS | Status: AC
  Administered 2013-01-07: 400 mg via INTRAVENOUS
  Filled 2013-01-07: qty 200

## 2013-01-07 MED ORDER — FENTANYL CITRATE 0.05 MG/ML IJ SOLN
INTRAMUSCULAR | Status: DC | PRN
  Administered 2013-01-07 (×2): 50 ug via INTRAVENOUS

## 2013-01-07 MED ORDER — ONDANSETRON HCL 4 MG/2ML IJ SOLN
INTRAMUSCULAR | Status: DC | PRN
  Administered 2013-01-07: 4 mg via INTRAVENOUS

## 2013-01-07 MED ORDER — LACTATED RINGERS IV SOLN
INTRAVENOUS | Status: DC
  Administered 2013-01-07: 12:00:00 via INTRAVENOUS
  Filled 2013-01-07: qty 1000

## 2013-01-07 MED ORDER — STERILE WATER FOR IRRIGATION IR SOLN
Status: DC | PRN
  Administered 2013-01-07: 3000 mL via INTRAVESICAL

## 2013-01-07 MED ORDER — HYDROCODONE-ACETAMINOPHEN 5-325 MG PO TABS: 1.0000 | ORAL_TABLET | Freq: Four times a day (QID) | ORAL | Status: DC | PRN

## 2013-01-07 MED ORDER — PROMETHAZINE HCL 25 MG/ML IJ SOLN
6.2500 mg | INTRAMUSCULAR | Status: DC | PRN
  Filled 2013-01-07: qty 1

## 2013-01-07 MED ORDER — IOHEXOL 350 MG/ML SOLN
INTRAVENOUS | Status: DC | PRN
  Administered 2013-01-07: 7 mL

## 2013-01-07 MED ORDER — KETOROLAC TROMETHAMINE 30 MG/ML IJ SOLN
INTRAMUSCULAR | Status: DC | PRN
  Administered 2013-01-07: 30 mg via INTRAVENOUS

## 2013-01-07 MED ORDER — PROPOFOL 10 MG/ML IV BOLUS
INTRAVENOUS | Status: DC | PRN
  Administered 2013-01-07: 250 mg via INTRAVENOUS
  Administered 2013-01-07: 50 mg via INTRAVENOUS

## 2013-01-07 MED ORDER — FENTANYL CITRATE 0.05 MG/ML IJ SOLN
25.0000 ug | INTRAMUSCULAR | Status: DC | PRN
  Filled 2013-01-07: qty 1

## 2013-01-07 MED ORDER — MIDAZOLAM HCL 5 MG/5ML IJ SOLN
INTRAMUSCULAR | Status: DC | PRN
  Administered 2013-01-07: 2 mg via INTRAVENOUS

## 2013-01-07 MED ORDER — LIDOCAINE HCL (CARDIAC) 20 MG/ML IV SOLN
INTRAVENOUS | Status: DC | PRN
  Administered 2013-01-07: 80 mg via INTRAVENOUS

## 2013-01-07 MED ORDER — CIPROFLOXACIN HCL 250 MG PO TABS: 250.0000 mg | ORAL_TABLET | Freq: Two times a day (BID) | ORAL | Status: DC

## 2013-01-07 SURGICAL SUPPLY — 27 items
BAG DRAIN URO-CYSTO SKYTR STRL (DRAIN) ×2 IMPLANT
BALLN NEPHROSTOMY (BALLOONS) ×2
BALLOON NEPHROSTOMY (BALLOONS) ×1 IMPLANT
CANISTER SUCT LVC 12 LTR MEDI- (MISCELLANEOUS) ×2 IMPLANT
CATH FOLEY 2W COUNCIL 5CC 18FR (CATHETERS) ×2 IMPLANT
CATH FOLEY 2WAY SLVR  5CC 16FR (CATHETERS)
CATH FOLEY 2WAY SLVR 5CC 16FR (CATHETERS) IMPLANT
CLOTH BEACON ORANGE TIMEOUT ST (SAFETY) ×2 IMPLANT
DRAPE CAMERA CLOSED 9X96 (DRAPES) ×2 IMPLANT
ELECT REM PT RETURN 9FT ADLT (ELECTROSURGICAL) ×2
ELECTRODE REM PT RTRN 9FT ADLT (ELECTROSURGICAL) ×1 IMPLANT
GLOVE BIO SURGEON STRL SZ7 (GLOVE) ×2 IMPLANT
GLOVE BIO SURGEON STRL SZ7.5 (GLOVE) ×2 IMPLANT
GLOVE INDICATOR 6.5 STRL GRN (GLOVE) ×4 IMPLANT
GOWN STRL NON-REIN LRG LVL3 (GOWN DISPOSABLE) ×2 IMPLANT
GOWN STRL REIN XL XLG (GOWN DISPOSABLE) ×2 IMPLANT
GOWN XL W/COTTON TOWEL STD (GOWNS) IMPLANT
GUIDEWIRE STR DUAL SENSOR (WIRE) ×2 IMPLANT
HOLDER FOLEY CATH W/STRAP (MISCELLANEOUS) ×2 IMPLANT
NDL SAFETY ECLIPSE 18X1.5 (NEEDLE) IMPLANT
NEEDLE HYPO 18GX1.5 SHARP (NEEDLE)
NEEDLE HYPO 22GX1.5 SAFETY (NEEDLE) IMPLANT
NS IRRIG 500ML POUR BTL (IV SOLUTION) IMPLANT
PACK CYSTOSCOPY (CUSTOM PROCEDURE TRAY) ×2 IMPLANT
SYR 20CC LL (SYRINGE) IMPLANT
SYR 30ML LL (SYRINGE) IMPLANT
WATER STERILE IRR 3000ML UROMA (IV SOLUTION) ×2 IMPLANT

## 2013-01-07 NOTE — Anesthesia Preprocedure Evaluation (Signed)
Anesthesia Evaluation  Patient identified by MRN, date of birth, ID band Patient awake    Reviewed: Allergy & Precautions, H&P , NPO status , Patient's Chart, lab work & pertinent test results  Airway Mallampati: II TM Distance: >3 FB Neck ROM: Full    Dental no notable dental hx.    Pulmonary asthma , sleep apnea ,  breath sounds clear to auscultation  Pulmonary exam normal       Cardiovascular Exercise Tolerance: Good hypertension, Pt. on medications and Pt. on home beta blockers negative cardio ROS  Rhythm:Regular Rate:Normal     Neuro/Psych PSYCHIATRIC DISORDERS negative neurological ROS     GI/Hepatic Neg liver ROS, GERD-  Medicated,  Endo/Other  negative endocrine ROS  Renal/GU negative Renal ROS  negative genitourinary   Musculoskeletal negative musculoskeletal ROS (+)   Abdominal (+) + obese,   Peds negative pediatric ROS (+)  Hematology negative hematology ROS (+)   Anesthesia Other Findings   Reproductive/Obstetrics negative OB ROS                           Anesthesia Physical Anesthesia Plan  ASA: II  Anesthesia Plan: General   Post-op Pain Management:    Induction: Intravenous  Airway Management Planned: LMA  Additional Equipment:   Intra-op Plan:   Post-operative Plan: Extubation in OR  Informed Consent: I have reviewed the patients History and Physical, chart, labs and discussed the procedure including the risks, benefits and alternatives for the proposed anesthesia with the patient or authorized representative who has indicated his/her understanding and acceptance.   Dental advisory given  Plan Discussed with: CRNA  Anesthesia Plan Comments:         Anesthesia Quick Evaluation

## 2013-01-07 NOTE — Transfer of Care (Signed)
Immediate Anesthesia Transfer of Care Note  Patient: Douglas Carpenter  Procedure(s) Performed: Procedure(s): CYSTOSCOPY WITH URETHRAL DILATATION BALLOON DILATION  (N/A)  Patient Location: PACU  Anesthesia Type:General  Level of Consciousness: awake, alert  and oriented  Airway & Oxygen Therapy: Patient Spontanous Breathing and Patient connected to nasal cannula oxygen  Post-op Assessment: Report given to PACU RN  Post vital signs: Reviewed and stable  Complications: No apparent anesthesia complications

## 2013-01-07 NOTE — Anesthesia Procedure Notes (Signed)
Procedure Name: LMA Insertion Date/Time: 01/07/2013 12:30 PM Performed by: Maris Berger T Pre-anesthesia Checklist: Patient identified, Emergency Drugs available, Suction available and Patient being monitored Patient Re-evaluated:Patient Re-evaluated prior to inductionOxygen Delivery Method: Circle System Utilized Preoxygenation: Pre-oxygenation with 100% oxygen Intubation Type: IV induction Ventilation: Mask ventilation without difficulty LMA: LMA inserted LMA Size: 5.0 Number of attempts: 1 Airway Equipment and Method: bite block Placement Confirmation: positive ETCO2 Dental Injury: Teeth and Oropharynx as per pre-operative assessment

## 2013-01-07 NOTE — Interval H&P Note (Signed)
History and Physical Interval Note:  01/07/2013 11:53 AM  Douglas Carpenter  has presented today for surgery, with the diagnosis of URETHRAL STRICTURE  The various methods of treatment have been discussed with the patient and family. After consideration of risks, benefits and other options for treatment, the patient has consented to  Procedure(s): CYSTOSCOPY WITH URETHRAL DILATATION BALLOON DILATION  (N/A) as a surgical intervention .  The patient's history has been reviewed, patient examined, no change in status, stable for surgery.  I have reviewed the patient's chart and labs.  Questions were answered to the patient's satisfaction.     Alwyn Cordner S

## 2013-01-07 NOTE — Op Note (Signed)
Preoperative diagnosis: Urethral stricture/BPH/voiding dysfunction Postoperative diagnosis: Same  Procedure: Cystoscopy with balloon dilation of urethral stricture   Surgeon: Valetta Fuller M.D.  Anesthesia: Gen.  Indications: Mr. Bermingham is 57 years of age. He came in with very long-standing and progressive voiding symptoms previously receiving care in the [facility] hospital system. He been started on alpha blocker without much improvement. He had severe symptoms. The patient underwent urodynamics but placement of the catheter was difficult it appeared that he did indeed have a bulbar urethral stricture. That was gently dilated in the office. Urodynamics did show some mild bladder instability with equivocal pressure flow studies. It was unclear loss whether he had a component of bladder neck obstruction from BPH or if most of his voiding dysfunction was due to urethral stricture. He presents now for more formal treatment of his stricture and assessment of his prostatic urethra and bladder.     Technique and findings: Patient was brought the operating room where he had successful induction general anesthesia. Placed in lithotomy position prepped and draped in usual manner. PAS compression boots were used along with perioperative antibiotics. Appropriate surgical timeout was performed. Cystoscopy revealed a moderate bulbar urethral stricture of estimated 8 French size. The length of the stricture was difficult to determine. A guidewire was placed in the stricture to the bladder with fluoroscopic control. A 24 French fascial dilating balloon was utilized to dilate the stricture to approximately 14 atmospheres of pressure for 3-4 minutes. At the completion of this we were able to easily get the larger rigid cystoscope through this area. The stricture itself appeared to measure approximately a centimeter in length. The prostatic urethra was relatively short at 3-3-1/2 cm. There was some slight elevation of his bladder  base and bladder neck but not significant visual obstruction. The bladder showed no other significant pathology. Over the guidewire I placed an 52 [place] council tip Foley catheter which was hooked to a leg bag. The patient was brought to recovery room stable condition having had no obvious complications or problems.

## 2013-01-08 ENCOUNTER — Encounter (HOSPITAL_BASED_OUTPATIENT_CLINIC_OR_DEPARTMENT_OTHER): Payer: Self-pay | Admitting: Urology

## 2013-01-08 NOTE — Anesthesia Postprocedure Evaluation (Signed)
  Anesthesia Post-op Note  Patient: Douglas Carpenter  Procedure(s) Performed: Procedure(s) (LRB): CYSTOSCOPY WITH URETHRAL DILATATION BALLOON DILATION  (N/A)  Patient Location: PACU  Anesthesia Type: General  Level of Consciousness: awake and alert   Airway and Oxygen Therapy: Patient Spontanous Breathing  Post-op Pain: mild  Post-op Assessment: Post-op Vital signs reviewed, Patient's Cardiovascular Status Stable, Respiratory Function Stable, Patent Airway and No signs of Nausea or vomiting  Last Vitals:  Filed Vitals:   01/07/13 1514  BP: 104/67  Pulse: 63  Temp: 36.2 C  Resp: 16    Post-op Vital Signs: stable   Complications: No apparent anesthesia complications

## 2013-01-09 DIAGNOSIS — N35919 Unspecified urethral stricture, male, unspecified site: Secondary | ICD-10-CM | POA: Diagnosis not present

## 2013-01-09 DIAGNOSIS — N401 Enlarged prostate with lower urinary tract symptoms: Secondary | ICD-10-CM | POA: Diagnosis not present

## 2013-01-31 DIAGNOSIS — M545 Low back pain, unspecified: Secondary | ICD-10-CM | POA: Diagnosis not present

## 2013-02-05 DIAGNOSIS — M545 Low back pain, unspecified: Secondary | ICD-10-CM | POA: Diagnosis not present

## 2013-02-07 DIAGNOSIS — M545 Low back pain, unspecified: Secondary | ICD-10-CM | POA: Diagnosis not present

## 2013-02-12 DIAGNOSIS — N401 Enlarged prostate with lower urinary tract symptoms: Secondary | ICD-10-CM | POA: Diagnosis not present

## 2013-02-12 DIAGNOSIS — N35919 Unspecified urethral stricture, male, unspecified site: Secondary | ICD-10-CM | POA: Diagnosis not present

## 2013-02-14 DIAGNOSIS — M545 Low back pain, unspecified: Secondary | ICD-10-CM | POA: Diagnosis not present

## 2013-02-15 DIAGNOSIS — M545 Low back pain, unspecified: Secondary | ICD-10-CM | POA: Diagnosis not present

## 2013-03-27 DIAGNOSIS — IMO0002 Reserved for concepts with insufficient information to code with codable children: Secondary | ICD-10-CM | POA: Diagnosis not present

## 2013-03-27 DIAGNOSIS — M5126 Other intervertebral disc displacement, lumbar region: Secondary | ICD-10-CM | POA: Diagnosis not present

## 2013-03-27 DIAGNOSIS — M47817 Spondylosis without myelopathy or radiculopathy, lumbosacral region: Secondary | ICD-10-CM | POA: Diagnosis not present

## 2013-06-05 ENCOUNTER — Other Ambulatory Visit: Payer: Self-pay | Admitting: Neurosurgery

## 2013-06-05 DIAGNOSIS — M47816 Spondylosis without myelopathy or radiculopathy, lumbar region: Secondary | ICD-10-CM

## 2013-06-05 DIAGNOSIS — M47817 Spondylosis without myelopathy or radiculopathy, lumbosacral region: Secondary | ICD-10-CM | POA: Diagnosis not present

## 2013-06-05 DIAGNOSIS — M5126 Other intervertebral disc displacement, lumbar region: Secondary | ICD-10-CM | POA: Diagnosis not present

## 2013-06-05 DIAGNOSIS — IMO0002 Reserved for concepts with insufficient information to code with codable children: Secondary | ICD-10-CM | POA: Diagnosis not present

## 2013-06-13 ENCOUNTER — Ambulatory Visit
Admission: RE | Admit: 2013-06-13 | Discharge: 2013-06-13 | Disposition: A | Discharge status: Home / Self Care | Payer: Medicare Other | Source: Ambulatory Visit | Attending: Neurosurgery | Admitting: Neurosurgery

## 2013-06-13 DIAGNOSIS — M47816 Spondylosis without myelopathy or radiculopathy, lumbar region: Secondary | ICD-10-CM

## 2013-06-13 DIAGNOSIS — M47817 Spondylosis without myelopathy or radiculopathy, lumbosacral region: Secondary | ICD-10-CM | POA: Diagnosis not present

## 2013-06-13 DIAGNOSIS — M5126 Other intervertebral disc displacement, lumbar region: Secondary | ICD-10-CM | POA: Diagnosis not present

## 2013-06-13 MED ORDER — GADOBENATE DIMEGLUMINE 529 MG/ML IV SOLN
20.0000 mL | Freq: Once | INTRAVENOUS | Status: AC | PRN
  Administered 2013-06-13: 20 mL via INTRAVENOUS

## 2013-06-25 DIAGNOSIS — N401 Enlarged prostate with lower urinary tract symptoms: Secondary | ICD-10-CM | POA: Diagnosis not present

## 2013-06-25 DIAGNOSIS — N35919 Unspecified urethral stricture, male, unspecified site: Secondary | ICD-10-CM | POA: Diagnosis not present

## 2013-06-25 DIAGNOSIS — N139 Obstructive and reflux uropathy, unspecified: Secondary | ICD-10-CM | POA: Diagnosis not present

## 2013-07-01 DIAGNOSIS — IMO0002 Reserved for concepts with insufficient information to code with codable children: Secondary | ICD-10-CM | POA: Diagnosis not present

## 2013-07-01 DIAGNOSIS — M5126 Other intervertebral disc displacement, lumbar region: Secondary | ICD-10-CM | POA: Diagnosis not present

## 2013-07-01 DIAGNOSIS — M47817 Spondylosis without myelopathy or radiculopathy, lumbosacral region: Secondary | ICD-10-CM | POA: Diagnosis not present

## 2013-07-04 DIAGNOSIS — IMO0002 Reserved for concepts with insufficient information to code with codable children: Secondary | ICD-10-CM | POA: Diagnosis not present

## 2013-07-04 DIAGNOSIS — M5137 Other intervertebral disc degeneration, lumbosacral region: Secondary | ICD-10-CM | POA: Diagnosis not present

## 2013-07-04 DIAGNOSIS — M47817 Spondylosis without myelopathy or radiculopathy, lumbosacral region: Secondary | ICD-10-CM | POA: Diagnosis not present

## 2013-08-21 DIAGNOSIS — E669 Obesity, unspecified: Secondary | ICD-10-CM | POA: Diagnosis not present

## 2013-08-21 DIAGNOSIS — M47817 Spondylosis without myelopathy or radiculopathy, lumbosacral region: Secondary | ICD-10-CM | POA: Diagnosis not present

## 2013-08-21 DIAGNOSIS — M5137 Other intervertebral disc degeneration, lumbosacral region: Secondary | ICD-10-CM | POA: Diagnosis not present

## 2013-08-21 DIAGNOSIS — M48061 Spinal stenosis, lumbar region without neurogenic claudication: Secondary | ICD-10-CM | POA: Diagnosis not present

## 2013-08-29 DIAGNOSIS — IMO0002 Reserved for concepts with insufficient information to code with codable children: Secondary | ICD-10-CM | POA: Diagnosis not present

## 2013-08-29 DIAGNOSIS — M47817 Spondylosis without myelopathy or radiculopathy, lumbosacral region: Secondary | ICD-10-CM | POA: Diagnosis not present

## 2013-08-29 DIAGNOSIS — M5137 Other intervertebral disc degeneration, lumbosacral region: Secondary | ICD-10-CM | POA: Diagnosis not present

## 2013-08-29 DIAGNOSIS — M48061 Spinal stenosis, lumbar region without neurogenic claudication: Secondary | ICD-10-CM | POA: Diagnosis not present

## 2013-09-09 DIAGNOSIS — M5137 Other intervertebral disc degeneration, lumbosacral region: Secondary | ICD-10-CM | POA: Diagnosis not present

## 2013-09-09 DIAGNOSIS — Z6834 Body mass index (BMI) 34.0-34.9, adult: Secondary | ICD-10-CM | POA: Diagnosis not present

## 2013-09-09 DIAGNOSIS — M48061 Spinal stenosis, lumbar region without neurogenic claudication: Secondary | ICD-10-CM | POA: Diagnosis not present

## 2013-09-09 DIAGNOSIS — M47817 Spondylosis without myelopathy or radiculopathy, lumbosacral region: Secondary | ICD-10-CM | POA: Diagnosis not present

## 2013-10-07 DIAGNOSIS — M48061 Spinal stenosis, lumbar region without neurogenic claudication: Secondary | ICD-10-CM | POA: Diagnosis not present

## 2013-10-07 DIAGNOSIS — M5137 Other intervertebral disc degeneration, lumbosacral region: Secondary | ICD-10-CM | POA: Diagnosis not present

## 2013-10-07 DIAGNOSIS — M47817 Spondylosis without myelopathy or radiculopathy, lumbosacral region: Secondary | ICD-10-CM | POA: Diagnosis not present

## 2013-10-07 DIAGNOSIS — IMO0002 Reserved for concepts with insufficient information to code with codable children: Secondary | ICD-10-CM | POA: Diagnosis not present

## 2013-10-17 ENCOUNTER — Other Ambulatory Visit: Payer: Self-pay | Admitting: Neurosurgery

## 2013-10-17 DIAGNOSIS — IMO0002 Reserved for concepts with insufficient information to code with codable children: Secondary | ICD-10-CM | POA: Diagnosis not present

## 2013-10-17 DIAGNOSIS — M48061 Spinal stenosis, lumbar region without neurogenic claudication: Secondary | ICD-10-CM | POA: Diagnosis not present

## 2013-10-17 DIAGNOSIS — M5137 Other intervertebral disc degeneration, lumbosacral region: Secondary | ICD-10-CM | POA: Diagnosis not present

## 2013-10-17 DIAGNOSIS — M47817 Spondylosis without myelopathy or radiculopathy, lumbosacral region: Secondary | ICD-10-CM | POA: Diagnosis not present

## 2013-10-18 ENCOUNTER — Other Ambulatory Visit: Payer: Self-pay | Admitting: Neurosurgery

## 2013-10-23 ENCOUNTER — Encounter (HOSPITAL_COMMUNITY): Payer: Self-pay | Admitting: Pharmacy Technician

## 2013-10-29 NOTE — Pre-Procedure Instructions (Signed)
Douglas Carpenter  10/29/2013   Your procedure is scheduled on: Wednesday, Nov 06, 2013 at 8:30 AM  Report to [facility] admissions Area (use Main Entrance "A'') at 6:30 AM.  Call this number if you have problems the morning of surgery: [phone]   Remember:   Do not eat food or drink liquids after midnight.   Take these medicines the morning of surgery with A SIP OF WATER: atenolol (TENORMIN), omeprazole (PRILOSEC), topiramate (TOPAMAX), if needed:diazepam (VALIUM) for back pain, THERA TEARS for dry eyes, oxycodone for pain Stop taking Aspirin,vitamins and herbal medications Omega-3 Fatty Acids ( Fish Oil) . Do not take any NSAIDs ie: Ibuprofen, Advil, Naproxen or any medication containing Aspirin ( Excedrin), diclofenac (VOLTAREN). Stop one week prior to procedure.  Do not wear jewelry   Do not wear lotions, powders, or perfumes. You may wear deodorant.    Men may shave face and neck.   Do not bring valuables to the hospital.  [facility] is not responsible for any belongings or valuables.               Contacts, dentures or bridgework may not be worn into surgery.   Leave suitcase in the car. After surgery it may be brought to your room.   For patients admitted to the hospital, discharge time is determined by your treatment team.               Patients discharged the day of surgery will not be allowed to drive home.  Name and phone number of your driver:   Special Instructions:  Special Instructions:Special Instructions: [facility] - Preparing for Surgery  Before surgery, you can play an important role.  Because skin is not sterile, your skin needs to be as free of germs as possible.  You can reduce the number of germs on you skin by washing with CHG (chlorahexidine gluconate) soap before surgery.  CHG is an antiseptic cleaner which kills germs and bonds with the skin to continue killing germs even after washing.  Please DO NOT use if you have an allergy to CHG or  antibacterial soaps.  If your skin becomes reddened/irritated stop using the CHG and inform your nurse when you arrive at Short Stay.  Do not shave (including legs and underarms) for at least 48 hours prior to the first CHG shower.  You may shave your face.  Please follow these instructions carefully:   1.  Shower with CHG Soap the night before surgery and the morning of Surgery.  2.  If you choose to wash your hair, wash your hair first as usual with your normal shampoo.  3.  After you shampoo, rinse your hair and body thoroughly to remove the Shampoo.  4.  Use CHG as you would any other liquid soap.  You can apply chg directly  to the skin and wash gently with scrungie or a clean washcloth.  5.  Apply the CHG Soap to your body ONLY FROM THE NECK DOWN.  Do not use on open wounds or open sores.  Avoid contact with your eyes, ears, mouth and genitals (private parts).  Wash genitals (private parts) with your normal soap.  6.  Wash thoroughly, paying special attention to the area where your surgery will be performed.  7.  Thoroughly rinse your body with warm water from the neck down.  8.  DO NOT shower/wash with your normal soap after using and rinsing off the CHG Soap.  9.  Pat yourself dry with a clean towel.            10.  Wear clean pajamas.            11.  Place clean sheets on your bed the night of your first shower and do not sleep with pets.  Day of Surgery  Do not apply any lotions/deoderants the morning of surgery.  Please wear clean clothes to the hospital/surgery center.   Please read over the following fact sheets that you were given: Pain Booklet, Coughing and Deep Breathing, Blood Transfusion Information, MRSA Information and Surgical Site Infection Prevention

## 2013-10-30 ENCOUNTER — Encounter (HOSPITAL_COMMUNITY)
Admission: RE | Admit: 2013-10-30 | Discharge: 2013-10-30 | Disposition: A | Discharge status: Home / Self Care | Payer: Medicare Other | Source: Ambulatory Visit | Attending: Neurosurgery | Admitting: Neurosurgery

## 2013-10-30 ENCOUNTER — Telehealth: Payer: Self-pay

## 2013-10-30 ENCOUNTER — Encounter (HOSPITAL_COMMUNITY): Payer: Self-pay

## 2013-10-30 DIAGNOSIS — Z01812 Encounter for preprocedural laboratory examination: Secondary | ICD-10-CM | POA: Insufficient documentation

## 2013-10-30 HISTORY — DX: Unspecified osteoarthritis, unspecified site: M19.90

## 2013-10-30 HISTORY — DX: Headache: R51

## 2013-10-30 LAB — COMPREHENSIVE METABOLIC PANEL
ALT: 21 U/L (ref 0–53)
AST: 22 U/L (ref 0–37)
Albumin: 3.8 g/dL (ref 3.5–5.2)
Alkaline Phosphatase: 47 U/L (ref 39–117)
BUN: 20 mg/dL (ref 6–23)
CO2: 22 mEq/L (ref 19–32)
Calcium: 9.1 mg/dL (ref 8.4–10.5)
Chloride: 108 mEq/L (ref 96–112)
Creatinine, Ser: 1.11 mg/dL (ref 0.50–1.35)
GFR calc Af Amer: 83 mL/min — ABNORMAL LOW (ref >90)
GFR calc non Af Amer: 71 mL/min — ABNORMAL LOW (ref >90)
Glucose, Bld: 103 mg/dL — ABNORMAL HIGH (ref 70–99)
Potassium: 4.3 mEq/L (ref 3.7–5.3)
Sodium: 144 mEq/L (ref 137–147)
Total Bilirubin: 0.2 mg/dL — ABNORMAL LOW (ref 0.3–1.2)
Total Protein: 7.4 g/dL (ref 6.0–8.3)

## 2013-10-30 LAB — TYPE AND SCREEN
ABO/RH(D): O POS
Antibody Screen: NEGATIVE

## 2013-10-30 LAB — SURGICAL PCR SCREEN
MRSA, PCR: NEGATIVE
Staphylococcus aureus: NEGATIVE

## 2013-10-30 LAB — CBC
HCT: 40.4 % (ref 39.0–52.0)
Hemoglobin: 13.8 g/dL (ref 13.0–17.0)
MCH: 30.5 pg (ref 26.0–34.0)
MCHC: 34.2 g/dL (ref 30.0–36.0)
MCV: 89.4 fL (ref 78.0–100.0)
Platelets: 210 10*3/uL (ref 150–400)
RBC: 4.52 MIL/uL (ref 4.22–5.81)
RDW: 13.9 % (ref 11.5–15.5)
WBC: 4.1 10*3/uL (ref 4.0–10.5)

## 2013-10-30 LAB — ABO/RH: ABO/RH(D): O POS

## 2013-10-30 NOTE — Progress Notes (Signed)
10/30/13 1047  OBSTRUCTIVE SLEEP APNEA  Have you ever been diagnosed with sleep apnea through a sleep study? Yes  If yes, do you have and use a CPAP or BPAP machine every night? 0 ([facility] did study, told that the sleep apnea is mild, no need for CPAP)  Do you snore loudly (loud enough to be heard through closed doors)?  1  Do you often feel tired, fatigued, or sleepy during the daytime? 1  Has anyone observed you stop breathing during your sleep? 1  Do you have, or are you being treated for high blood pressure? 1  BMI more than 35 kg/m2? 0  Age over 74 years old? 1  Neck circumference greater than 40 cm/16 inches? 1  Gender: 1  Obstructive Sleep Apnea Score 7  Score 4 or greater  Results sent to PCP

## 2013-10-30 NOTE — Telephone Encounter (Signed)
Pt notified. He will come in for eval.     Please call this patient. During his recent pre-op eval he was noted to have risk factors for sleep apnea. We understand that he's previously been diagnosed with mild sleep apnea (at the [place]), but this should be reassessed. Please ask him to RTC for follow-up and discussion.   ----- Message -----   From: Elnora Morrison, RN Sent: 10/30/2013 10:53 AM To: Ellison Carwin, MD   Screening tool done at preadmit visit for preparation for back surgery indicates pt. At increased risk for sleep apnea. Screening done at [facility] in the past states that his results suggest mild sleep apnea, but wife states that she observes him ceasing to breathe for periods of time & then followed by seizure like activity

## 2013-11-05 MED ORDER — CEFAZOLIN SODIUM-DEXTROSE 2-3 GM-% IV SOLR
2.0000 g | INTRAVENOUS | Status: AC
  Administered 2013-11-06: 2 g via INTRAVENOUS
  Filled 2013-11-05: qty 50

## 2013-11-06 ENCOUNTER — Inpatient Hospital Stay (HOSPITAL_COMMUNITY): Payer: Medicare Other

## 2013-11-06 ENCOUNTER — Encounter (HOSPITAL_COMMUNITY): Payer: Self-pay | Admitting: Critical Care Medicine

## 2013-11-06 ENCOUNTER — Encounter (HOSPITAL_COMMUNITY)
Admission: RE | Disposition: A | Discharge status: Home / Self Care | Payer: Self-pay | Source: Ambulatory Visit | Attending: Neurosurgery

## 2013-11-06 ENCOUNTER — Inpatient Hospital Stay (HOSPITAL_COMMUNITY): Payer: Medicare Other | Admitting: Critical Care Medicine

## 2013-11-06 ENCOUNTER — Encounter (HOSPITAL_COMMUNITY): Payer: Medicare Other | Admitting: Critical Care Medicine

## 2013-11-06 ENCOUNTER — Inpatient Hospital Stay (HOSPITAL_COMMUNITY)
Admission: RE | Admit: 2013-11-06 | Discharge: 2013-11-08 | DRG: 460 | Disposition: A | Discharge status: Home / Self Care | Payer: Medicare Other | Source: Ambulatory Visit | Attending: Neurosurgery | Admitting: Neurosurgery

## 2013-11-06 DIAGNOSIS — N529 Male erectile dysfunction, unspecified: Secondary | ICD-10-CM | POA: Diagnosis present

## 2013-11-06 DIAGNOSIS — Z800 Family history of malignant neoplasm of digestive organs: Secondary | ICD-10-CM

## 2013-11-06 DIAGNOSIS — Z7982 Long term (current) use of aspirin: Secondary | ICD-10-CM | POA: Diagnosis not present

## 2013-11-06 DIAGNOSIS — Z87891 Personal history of nicotine dependence: Secondary | ICD-10-CM | POA: Diagnosis not present

## 2013-11-06 DIAGNOSIS — Z8042 Family history of malignant neoplasm of prostate: Secondary | ICD-10-CM

## 2013-11-06 DIAGNOSIS — G4733 Obstructive sleep apnea (adult) (pediatric): Secondary | ICD-10-CM | POA: Diagnosis present

## 2013-11-06 DIAGNOSIS — J45909 Unspecified asthma, uncomplicated: Secondary | ICD-10-CM | POA: Diagnosis present

## 2013-11-06 DIAGNOSIS — IMO0002 Reserved for concepts with insufficient information to code with codable children: Secondary | ICD-10-CM | POA: Diagnosis not present

## 2013-11-06 DIAGNOSIS — M519 Unspecified thoracic, thoracolumbar and lumbosacral intervertebral disc disorder: Secondary | ICD-10-CM | POA: Diagnosis not present

## 2013-11-06 DIAGNOSIS — E785 Hyperlipidemia, unspecified: Secondary | ICD-10-CM | POA: Diagnosis present

## 2013-11-06 DIAGNOSIS — K219 Gastro-esophageal reflux disease without esophagitis: Secondary | ICD-10-CM | POA: Diagnosis present

## 2013-11-06 DIAGNOSIS — Z79899 Other long term (current) drug therapy: Secondary | ICD-10-CM | POA: Diagnosis not present

## 2013-11-06 DIAGNOSIS — Z886 Allergy status to analgesic agent status: Secondary | ICD-10-CM

## 2013-11-06 DIAGNOSIS — M5137 Other intervertebral disc degeneration, lumbosacral region: Secondary | ICD-10-CM | POA: Diagnosis present

## 2013-11-06 DIAGNOSIS — I10 Essential (primary) hypertension: Secondary | ICD-10-CM | POA: Diagnosis not present

## 2013-11-06 DIAGNOSIS — M5126 Other intervertebral disc displacement, lumbar region: Secondary | ICD-10-CM | POA: Diagnosis not present

## 2013-11-06 DIAGNOSIS — Z8249 Family history of ischemic heart disease and other diseases of the circulatory system: Secondary | ICD-10-CM | POA: Diagnosis not present

## 2013-11-06 DIAGNOSIS — M48061 Spinal stenosis, lumbar region without neurogenic claudication: Secondary | ICD-10-CM | POA: Diagnosis not present

## 2013-11-06 DIAGNOSIS — N400 Enlarged prostate without lower urinary tract symptoms: Secondary | ICD-10-CM | POA: Diagnosis present

## 2013-11-06 DIAGNOSIS — J310 Chronic rhinitis: Secondary | ICD-10-CM | POA: Diagnosis present

## 2013-11-06 DIAGNOSIS — M51379 Other intervertebral disc degeneration, lumbosacral region without mention of lumbar back pain or lower extremity pain: Secondary | ICD-10-CM | POA: Diagnosis present

## 2013-11-06 DIAGNOSIS — Z801 Family history of malignant neoplasm of trachea, bronchus and lung: Secondary | ICD-10-CM | POA: Diagnosis not present

## 2013-11-06 DIAGNOSIS — M47817 Spondylosis without myelopathy or radiculopathy, lumbosacral region: Secondary | ICD-10-CM | POA: Diagnosis present

## 2013-11-06 SURGERY — POSTERIOR LUMBAR FUSION 1 LEVEL
Anesthesia: General | Site: Back

## 2013-11-06 MED ORDER — ONDANSETRON HCL 4 MG/2ML IJ SOLN
INTRAMUSCULAR | Status: DC | PRN
  Administered 2013-11-06: 4 mg via INTRAVENOUS

## 2013-11-06 MED ORDER — MIDAZOLAM HCL 5 MG/5ML IJ SOLN
INTRAMUSCULAR | Status: DC | PRN
  Administered 2013-11-06: 2 mg via INTRAVENOUS

## 2013-11-06 MED ORDER — ARTIFICIAL TEARS OP OINT
TOPICAL_OINTMENT | OPHTHALMIC | Status: AC
  Filled 2013-11-06: qty 3.5

## 2013-11-06 MED ORDER — PROPOFOL 10 MG/ML IV BOLUS
INTRAVENOUS | Status: AC
  Filled 2013-11-06: qty 20

## 2013-11-06 MED ORDER — BACITRACIN 50000 UNITS IM SOLR
INTRAMUSCULAR | Status: DC | PRN
  Administered 2013-11-06: 09:00:00

## 2013-11-06 MED ORDER — VANCOMYCIN HCL 1000 MG IV SOLR
INTRAVENOUS | Status: AC
  Filled 2013-11-06: qty 1000

## 2013-11-06 MED ORDER — MAGNESIUM HYDROXIDE 400 MG/5ML PO SUSP: 30.0000 mL | Freq: Every day | ORAL | Status: DC | PRN

## 2013-11-06 MED ORDER — HYDROMORPHONE HCL PF 1 MG/ML IJ SOLN
INTRAMUSCULAR | Status: AC
  Administered 2013-11-06: 0.5 mg via INTRAVENOUS
  Filled 2013-11-06: qty 1

## 2013-11-06 MED ORDER — DEXAMETHASONE SODIUM PHOSPHATE 4 MG/ML IJ SOLN
INTRAMUSCULAR | Status: DC | PRN
  Administered 2013-11-06: 4 mg via INTRAVENOUS

## 2013-11-06 MED ORDER — DEXTROSE 5 % IV SOLN
10.0000 mg | INTRAVENOUS | Status: DC | PRN
  Administered 2013-11-06: 20 ug/min via INTRAVENOUS

## 2013-11-06 MED ORDER — DIAZEPAM 5 MG PO TABS
ORAL_TABLET | ORAL | Status: AC
  Filled 2013-11-06: qty 1

## 2013-11-06 MED ORDER — HYDROXYZINE HCL 25 MG PO TABS: 50.0000 mg | ORAL_TABLET | ORAL | Status: DC | PRN

## 2013-11-06 MED ORDER — BISACODYL 10 MG RE SUPP: 10.0000 mg | Freq: Every day | RECTAL | Status: DC | PRN

## 2013-11-06 MED ORDER — LIDOCAINE HCL (CARDIAC) 20 MG/ML IV SOLN
INTRAVENOUS | Status: DC | PRN
  Administered 2013-11-06: 100 mg via INTRAVENOUS

## 2013-11-06 MED ORDER — BUPIVACAINE HCL (PF) 0.5 % IJ SOLN
INTRAMUSCULAR | Status: DC | PRN
  Administered 2013-11-06: 10 mL

## 2013-11-06 MED ORDER — HYDROCODONE-ACETAMINOPHEN 5-325 MG PO TABS: 1.0000 | ORAL_TABLET | ORAL | Status: DC | PRN

## 2013-11-06 MED ORDER — MIDAZOLAM HCL 2 MG/2ML IJ SOLN
INTRAMUSCULAR | Status: AC
Start: 2013-11-06 — End: 2013-11-06
  Filled 2013-11-06: qty 2

## 2013-11-06 MED ORDER — PANTOPRAZOLE SODIUM 40 MG PO TBEC
40.0000 mg | DELAYED_RELEASE_TABLET | Freq: Every day | ORAL | Status: DC
  Administered 2013-11-07: 40 mg via ORAL
  Filled 2013-11-06: qty 1

## 2013-11-06 MED ORDER — ATORVASTATIN CALCIUM 40 MG PO TABS
40.0000 mg | ORAL_TABLET | Freq: Every day | ORAL | Status: DC
  Administered 2013-11-06 – 2013-11-07 (×2): 40 mg via ORAL
  Filled 2013-11-06 (×3): qty 1

## 2013-11-06 MED ORDER — 0.9 % SODIUM CHLORIDE (POUR BTL) OPTIME
TOPICAL | Status: DC | PRN
  Administered 2013-11-06: 1000 mL

## 2013-11-06 MED ORDER — GLYCOPYRROLATE 0.2 MG/ML IJ SOLN
INTRAMUSCULAR | Status: AC
  Filled 2013-11-06: qty 2

## 2013-11-06 MED ORDER — PHENOL 1.4 % MT LIQD: 1.0000 | OROMUCOSAL | Status: DC | PRN

## 2013-11-06 MED ORDER — PROPOFOL 10 MG/ML IV BOLUS
INTRAVENOUS | Status: DC | PRN
  Administered 2013-11-06: 200 mg via INTRAVENOUS

## 2013-11-06 MED ORDER — ONDANSETRON HCL 4 MG/2ML IJ SOLN
INTRAMUSCULAR | Status: AC
  Filled 2013-11-06: qty 2

## 2013-11-06 MED ORDER — HYDROMORPHONE HCL PF 1 MG/ML IJ SOLN
0.2500 mg | INTRAMUSCULAR | Status: DC | PRN
  Administered 2013-11-06 (×4): 0.5 mg via INTRAVENOUS

## 2013-11-06 MED ORDER — MORPHINE SULFATE 4 MG/ML IJ SOLN: 4.0000 mg | INTRAMUSCULAR | Status: DC | PRN

## 2013-11-06 MED ORDER — ARTIFICIAL TEARS OP OINT
TOPICAL_OINTMENT | OPHTHALMIC | Status: DC | PRN
  Administered 2013-11-06: 1 via OPHTHALMIC

## 2013-11-06 MED ORDER — EPHEDRINE SULFATE 50 MG/ML IJ SOLN
INTRAMUSCULAR | Status: AC
  Filled 2013-11-06: qty 1

## 2013-11-06 MED ORDER — ATENOLOL 25 MG PO TABS
25.0000 mg | ORAL_TABLET | Freq: Every morning | ORAL | Status: DC
  Administered 2013-11-07: 25 mg via ORAL
  Filled 2013-11-06 (×2): qty 1

## 2013-11-06 MED ORDER — DIAZEPAM 5 MG PO TABS
5.0000 mg | ORAL_TABLET | Freq: Three times a day (TID) | ORAL | Status: DC | PRN
  Administered 2013-11-06 – 2013-11-08 (×2): 5 mg via ORAL
  Filled 2013-11-06: qty 1

## 2013-11-06 MED ORDER — KETOROLAC TROMETHAMINE 30 MG/ML IJ SOLN
INTRAMUSCULAR | Status: AC
  Filled 2013-11-06: qty 1

## 2013-11-06 MED ORDER — FENTANYL CITRATE 0.05 MG/ML IJ SOLN
INTRAMUSCULAR | Status: DC | PRN
  Administered 2013-11-06: 50 ug via INTRAVENOUS
  Administered 2013-11-06: 100 ug via INTRAVENOUS
  Administered 2013-11-06: 50 ug via INTRAVENOUS
  Administered 2013-11-06: 25 ug via INTRAVENOUS
  Administered 2013-11-06: 50 ug via INTRAVENOUS
  Administered 2013-11-06: 25 ug via INTRAVENOUS
  Administered 2013-11-06: 100 ug via INTRAVENOUS

## 2013-11-06 MED ORDER — FENTANYL CITRATE 0.05 MG/ML IJ SOLN
INTRAMUSCULAR | Status: AC
  Filled 2013-11-06: qty 5

## 2013-11-06 MED ORDER — SUCCINYLCHOLINE CHLORIDE 20 MG/ML IJ SOLN
INTRAMUSCULAR | Status: AC
  Filled 2013-11-06: qty 1

## 2013-11-06 MED ORDER — CEFAZOLIN SODIUM-DEXTROSE 2-3 GM-% IV SOLR
INTRAVENOUS | Status: AC
  Filled 2013-11-06: qty 50

## 2013-11-06 MED ORDER — OXYCODONE-ACETAMINOPHEN 5-325 MG PO TABS
1.0000 | ORAL_TABLET | ORAL | Status: DC | PRN
  Administered 2013-11-07: 2 via ORAL
  Filled 2013-11-06 (×2): qty 2

## 2013-11-06 MED ORDER — OXYCODONE HCL 5 MG/5ML PO SOLN
5.0000 mg | Freq: Once | ORAL | Status: AC | PRN
Start: 2013-11-06 — End: 2013-11-06

## 2013-11-06 MED ORDER — THROMBIN 20000 UNITS EX SOLR
CUTANEOUS | Status: DC | PRN
  Administered 2013-11-06: 09:00:00 via TOPICAL

## 2013-11-06 MED ORDER — ONDANSETRON HCL 4 MG/2ML IJ SOLN: 4.0000 mg | Freq: Four times a day (QID) | INTRAMUSCULAR | Status: DC | PRN

## 2013-11-06 MED ORDER — KCL IN DEXTROSE-NACL 20-5-0.45 MEQ/L-%-% IV SOLN
INTRAVENOUS | Status: DC
  Filled 2013-11-06 (×6): qty 1000

## 2013-11-06 MED ORDER — OXYCODONE-ACETAMINOPHEN 5-325 MG PO TABS
1.0000 | ORAL_TABLET | ORAL | Status: DC | PRN
  Administered 2013-11-06 – 2013-11-07 (×2): 2 via ORAL
  Filled 2013-11-06: qty 2

## 2013-11-06 MED ORDER — KETOROLAC TROMETHAMINE 30 MG/ML IJ SOLN
30.0000 mg | Freq: Once | INTRAMUSCULAR | Status: AC
  Administered 2013-11-06: 30 mg via INTRAVENOUS

## 2013-11-06 MED ORDER — CYCLOBENZAPRINE HCL 10 MG PO TABS
10.0000 mg | ORAL_TABLET | Freq: Three times a day (TID) | ORAL | Status: DC | PRN
  Administered 2013-11-07 (×3): 10 mg via ORAL
  Filled 2013-11-06 (×3): qty 1

## 2013-11-06 MED ORDER — DEXAMETHASONE SODIUM PHOSPHATE 4 MG/ML IJ SOLN
INTRAMUSCULAR | Status: AC
Start: 2013-11-06 — End: 2013-11-06
  Filled 2013-11-06: qty 1

## 2013-11-06 MED ORDER — NEOSTIGMINE METHYLSULFATE 10 MG/10ML IV SOLN
INTRAVENOUS | Status: AC
  Filled 2013-11-06: qty 1

## 2013-11-06 MED ORDER — GLYCOPYRROLATE 0.2 MG/ML IJ SOLN
INTRAMUSCULAR | Status: DC | PRN
  Administered 2013-11-06: 0.3 mg via INTRAVENOUS

## 2013-11-06 MED ORDER — LIDOCAINE HCL (CARDIAC) 20 MG/ML IV SOLN
INTRAVENOUS | Status: AC
  Filled 2013-11-06: qty 5

## 2013-11-06 MED ORDER — NEOSTIGMINE METHYLSULFATE 10 MG/10ML IV SOLN
INTRAVENOUS | Status: DC | PRN
  Administered 2013-11-06: 2 mg via INTRAVENOUS

## 2013-11-06 MED ORDER — LIDOCAINE-EPINEPHRINE 1 %-1:100000 IJ SOLN
INTRAMUSCULAR | Status: DC | PRN
  Administered 2013-11-06: 10 mL

## 2013-11-06 MED ORDER — EPHEDRINE SULFATE 50 MG/ML IJ SOLN
INTRAMUSCULAR | Status: DC | PRN
  Administered 2013-11-06: 5 mg via INTRAVENOUS

## 2013-11-06 MED ORDER — METOCLOPRAMIDE HCL 5 MG/ML IJ SOLN: 10.0000 mg | Freq: Once | INTRAMUSCULAR | Status: DC | PRN

## 2013-11-06 MED ORDER — TOPIRAMATE 25 MG PO TABS
25.0000 mg | ORAL_TABLET | Freq: Every day | ORAL | Status: DC
  Administered 2013-11-07: 25 mg via ORAL
  Filled 2013-11-06 (×2): qty 1

## 2013-11-06 MED ORDER — CEFAZOLIN SODIUM-DEXTROSE 2-3 GM-% IV SOLR
2.0000 g | Freq: Once | INTRAVENOUS | Status: AC
  Administered 2013-11-06: 2 g via INTRAVENOUS
  Filled 2013-11-06: qty 50

## 2013-11-06 MED ORDER — SODIUM CHLORIDE 0.9 % IJ SOLN
3.0000 mL | Freq: Two times a day (BID) | INTRAMUSCULAR | Status: DC
  Administered 2013-11-06 – 2013-11-07 (×3): 3 mL via INTRAVENOUS

## 2013-11-06 MED ORDER — KETOROLAC TROMETHAMINE 30 MG/ML IJ SOLN
30.0000 mg | Freq: Four times a day (QID) | INTRAMUSCULAR | Status: DC
  Administered 2013-11-06 – 2013-11-08 (×7): 30 mg via INTRAVENOUS
  Filled 2013-11-06 (×7): qty 1

## 2013-11-06 MED ORDER — LACTATED RINGERS IV SOLN
INTRAVENOUS | Status: DC | PRN
  Administered 2013-11-06 (×2): via INTRAVENOUS

## 2013-11-06 MED ORDER — ACETAMINOPHEN 650 MG RE SUPP
650.0000 mg | RECTAL | Status: DC | PRN
Start: 2013-11-06 — End: 2013-11-08

## 2013-11-06 MED ORDER — OXYCODONE HCL 5 MG PO TABS
5.0000 mg | ORAL_TABLET | Freq: Once | ORAL | Status: AC | PRN
  Administered 2013-11-06: 5 mg via ORAL

## 2013-11-06 MED ORDER — SODIUM CHLORIDE 0.9 % IJ SOLN
INTRAMUSCULAR | Status: AC
  Filled 2013-11-06: qty 10

## 2013-11-06 MED ORDER — VANCOMYCIN HCL 1000 MG IV SOLR
INTRAVENOUS | Status: DC | PRN
  Administered 2013-11-06: 1000 mg via TOPICAL

## 2013-11-06 MED ORDER — ALUM & MAG HYDROXIDE-SIMETH 200-200-20 MG/5ML PO SUSP
30.0000 mL | Freq: Four times a day (QID) | ORAL | Status: DC | PRN
  Filled 2013-11-06: qty 30

## 2013-11-06 MED ORDER — ROCURONIUM BROMIDE 100 MG/10ML IV SOLN
INTRAVENOUS | Status: DC | PRN
  Administered 2013-11-06: 50 mg via INTRAVENOUS

## 2013-11-06 MED ORDER — SODIUM CHLORIDE 0.9 % IJ SOLN: 3.0000 mL | INTRAMUSCULAR | Status: DC | PRN

## 2013-11-06 MED ORDER — ZOLPIDEM TARTRATE 5 MG PO TABS: 5.0000 mg | ORAL_TABLET | Freq: Every evening | ORAL | Status: DC | PRN

## 2013-11-06 MED ORDER — ACETAMINOPHEN 325 MG PO TABS: 650.0000 mg | ORAL_TABLET | ORAL | Status: DC | PRN

## 2013-11-06 MED ORDER — ROCURONIUM BROMIDE 50 MG/5ML IV SOLN
INTRAVENOUS | Status: AC
  Filled 2013-11-06: qty 1

## 2013-11-06 MED ORDER — OXYCODONE HCL 5 MG PO TABS
ORAL_TABLET | ORAL | Status: AC
  Filled 2013-11-06: qty 1

## 2013-11-06 MED ORDER — MENTHOL 3 MG MT LOZG: 1.0000 | LOZENGE | OROMUCOSAL | Status: DC | PRN

## 2013-11-06 SURGICAL SUPPLY — 79 items
BAG DECANTER FOR FLEXI CONT (MISCELLANEOUS) ×2 IMPLANT
BLADE 10 SAFETY STRL DISP (BLADE) IMPLANT
BLADE SURG ROTATE 9660 (MISCELLANEOUS) ×2 IMPLANT
BRUSH SCRUB EZ PLAIN DRY (MISCELLANEOUS) ×2 IMPLANT
BUR ACRON 5.0MM COATED (BURR) ×2 IMPLANT
BUR MATCHSTICK NEURO 3.0 LAGG (BURR) ×2 IMPLANT
CANISTER SUCT 3000ML (MISCELLANEOUS) ×2 IMPLANT
CAP LCK SPNE (Orthopedic Implant) ×2 IMPLANT
CAP LOCK SPINE RADIUS (Orthopedic Implant) ×2 IMPLANT
CAP LOCKING (Orthopedic Implant) ×2 IMPLANT
CONT SPEC 4OZ CLIKSEAL STRL BL (MISCELLANEOUS) ×4 IMPLANT
COVER BACK TABLE 24X17X13 BIG (DRAPES) IMPLANT
COVER TABLE BACK 60X90 (DRAPES) ×2 IMPLANT
DERMABOND ADHESIVE PROPEN (GAUZE/BANDAGES/DRESSINGS) ×5
DERMABOND ADVANCED (GAUZE/BANDAGES/DRESSINGS)
DERMABOND ADVANCED .7 DNX12 (GAUZE/BANDAGES/DRESSINGS) IMPLANT
DERMABOND ADVANCED .7 DNX6 (GAUZE/BANDAGES/DRESSINGS) ×5 IMPLANT
DRAPE C-ARM 42X72 X-RAY (DRAPES) ×4 IMPLANT
DRAPE LAPAROTOMY 100X72X124 (DRAPES) ×2 IMPLANT
DRAPE POUCH INSTRU U-SHP 10X18 (DRAPES) ×2 IMPLANT
DRAPE PROXIMA HALF (DRAPES) ×2 IMPLANT
DRSG EMULSION OIL 3X3 NADH (GAUZE/BANDAGES/DRESSINGS) IMPLANT
ELECT REM PT RETURN 9FT ADLT (ELECTROSURGICAL) ×2
ELECTRODE REM PT RTRN 9FT ADLT (ELECTROSURGICAL) ×1 IMPLANT
GAUZE SPONGE 4X4 16PLY XRAY LF (GAUZE/BANDAGES/DRESSINGS) ×2 IMPLANT
GLOVE BIOGEL PI IND STRL 7.5 (GLOVE) ×2 IMPLANT
GLOVE BIOGEL PI IND STRL 8 (GLOVE) ×2 IMPLANT
GLOVE BIOGEL PI INDICATOR 7.5 (GLOVE) ×2
GLOVE BIOGEL PI INDICATOR 8 (GLOVE) ×2
GLOVE ECLIPSE 6.5 STRL STRAW (GLOVE) ×2 IMPLANT
GLOVE ECLIPSE 7.5 STRL STRAW (GLOVE) ×4 IMPLANT
GLOVE EXAM NITRILE LRG STRL (GLOVE) IMPLANT
GLOVE EXAM NITRILE MD LF STRL (GLOVE) IMPLANT
GLOVE EXAM NITRILE XL STR (GLOVE) IMPLANT
GLOVE EXAM NITRILE XS STR PU (GLOVE) IMPLANT
GLOVE SURG SS PI 7.0 STRL IVOR (GLOVE) ×8 IMPLANT
GOWN BRE IMP SLV AUR LG STRL (GOWN DISPOSABLE) IMPLANT
GOWN BRE IMP SLV AUR XL STRL (GOWN DISPOSABLE) IMPLANT
GOWN STRL REIN 2XL LVL4 (GOWN DISPOSABLE) IMPLANT
GOWN STRL REUS W/ TWL LRG LVL3 (GOWN DISPOSABLE) ×2 IMPLANT
GOWN STRL REUS W/ TWL XL LVL3 (GOWN DISPOSABLE) ×2 IMPLANT
GOWN STRL REUS W/TWL LRG LVL3 (GOWN DISPOSABLE) ×2
GOWN STRL REUS W/TWL XL LVL3 (GOWN DISPOSABLE) ×2
KIT BASIN OR (CUSTOM PROCEDURE TRAY) ×2 IMPLANT
KIT INFUSE SMALL (Orthopedic Implant) ×2 IMPLANT
KIT ROOM TURNOVER OR (KITS) ×2 IMPLANT
MILL MEDIUM DISP (BLADE) ×2 IMPLANT
NEEDLE BONE MARROW 8GAX6 (NEEDLE) ×2 IMPLANT
NEEDLE SPNL 18GX3.5 QUINCKE PK (NEEDLE) IMPLANT
NEEDLE SPNL 22GX3.5 QUINCKE BK (NEEDLE) ×4 IMPLANT
NS IRRIG 1000ML POUR BTL (IV SOLUTION) ×2 IMPLANT
PACK LAMINECTOMY NEURO (CUSTOM PROCEDURE TRAY) ×2 IMPLANT
PAD ARMBOARD 7.5X6 YLW CONV (MISCELLANEOUS) ×6 IMPLANT
PATTIES SURGICAL .5 X.5 (GAUZE/BANDAGES/DRESSINGS) IMPLANT
PATTIES SURGICAL .5 X1 (DISPOSABLE) IMPLANT
PATTIES SURGICAL 1X1 (DISPOSABLE) IMPLANT
PEEK PLIF AVS 10X25X4 (Peek) ×4 IMPLANT
PLATE SPINOUS PROCESS 45MM (Plate) ×2 IMPLANT
ROD RADIUS 35MM (Rod) ×2 IMPLANT
SCREW 5.75X40M (Screw) ×6 IMPLANT
SLEEVE SURGEON STRL (DRAPES) ×2 IMPLANT
SPONGE GAUZE 4X4 12PLY (GAUZE/BANDAGES/DRESSINGS) ×2 IMPLANT
SPONGE LAP 4X18 X RAY DECT (DISPOSABLE) IMPLANT
SPONGE NEURO XRAY DETECT 1X3 (DISPOSABLE) IMPLANT
SPONGE SURGIFOAM ABS GEL 100 (HEMOSTASIS) ×2 IMPLANT
STRIP BIOACTIVE VITOSS 25X100X (Neuro Prosthesis/Implant) ×2 IMPLANT
STRIP BIOACTIVE VITOSS 25X52X4 (Orthopedic Implant) ×2 IMPLANT
SUT VIC AB 1 CT1 18XBRD ANBCTR (SUTURE) ×2 IMPLANT
SUT VIC AB 1 CT1 8-18 (SUTURE) ×2
SUT VIC AB 2-0 CP2 18 (SUTURE) ×4 IMPLANT
SYR 20ML ECCENTRIC (SYRINGE) ×2 IMPLANT
SYR 3ML LL SCALE MARK (SYRINGE) ×8 IMPLANT
SYR CONTROL 10ML LL (SYRINGE) ×2 IMPLANT
TAPE CLOTH SURG 4X10 WHT LF (GAUZE/BANDAGES/DRESSINGS) ×2 IMPLANT
TOWEL OR 17X24 6PK STRL BLUE (TOWEL DISPOSABLE) ×2 IMPLANT
TOWEL OR 17X26 10 PK STRL BLUE (TOWEL DISPOSABLE) ×2 IMPLANT
TRAY FOLEY CATH 14FRSI W/METER (CATHETERS) IMPLANT
TRAY FOLEY CATH 16FRSI W/METER (SET/KITS/TRAYS/PACK) ×2 IMPLANT
WATER STERILE IRR 1000ML POUR (IV SOLUTION) ×2 IMPLANT

## 2013-11-06 NOTE — Progress Notes (Signed)
Utilization review completed.  

## 2013-11-06 NOTE — Plan of Care (Signed)
Problem: Consults Goal: Diagnosis - Spinal Surgery Outcome: Completed/Met Date Met:  11/06/13 Thoraco/Lumbar Spine Fusion

## 2013-11-06 NOTE — Op Note (Signed)
11/06/2013  12:20 PM  PATIENT:  Douglas Carpenter  58 y.o. male  PRE-OPERATIVE DIAGNOSIS:  Right L4-5 lateral recess stenosis, lumbar degenerative disc disease, lumbar spondylosis, right lumbar radiculopathy   POST-OPERATIVE DIAGNOSIS:  Right L4-5 lateral recess stenosis, lumbar degenerative disc disease, lumbar spondylosis, right lumbar radiculopathy   PROCEDURE:  Procedure(s):  Lumbar four-five decompression with posterior lumbar interbody fusion with interbody prosthesis posterior lateral arthrodesis and posterior nonsegmental instrumentation:  Bilateral L4-5 lumbar decompression including bilateral laminotomy, facetectomy, and foraminotomy, with decompression of the central canal and neural foraminal stenosis, with decompression of the exiting L4 and L5 nerve roots bilaterally, with decompression beyond that required for interbody arthrodesis; bilateral L4-5 posterior lumbar interbody arthrodesis with AVS peek interbody implants, Vitoss BA with bone marrow aspirate, and infuse; bilateral L4-5 posterior lateral arthrodesis with left-sided nonsegmental radius posterior instrumentation and a Spire plate, locally harvested morcellized autograft, Vitoss BA with bone marrow aspirate, and infuse  SURGEON:  Surgeon(s): Hosie Spangle, MD Winfield Cunas, MD  ASSISTANTS: Ashok Pall, M.D.  ANESTHESIA:   general  EBL:  Total I/O In: 1300 [I.V.:1300] Out: 725 [Urine:525; Blood:200]  BLOOD ADMINISTERED:none, Cell Saver was used, but there was insufficient blood loss the processes the collected blood  COUNT: Correct per nursing staff  DICTATION: Patient is brought to the operating room placed under general endotracheal anesthesia. The patient was turned to prone position the lumbar region was prepped with Betadine soap and solution and draped in a sterile fashion. The midline was infiltrated with local anesthesia with epinephrine. A midline incision was made carried down through the subcutaneous  tissue, bipolar cautery and electrocautery were used to maintain hemostasis. Dissection was carried down to the lumbar fascia. The fascia was incised bilaterally and the paraspinal muscles were dissected with a spinous process and lamina in a subperiosteal fashion. An x-ray was taken for localization and the L4-5 level was localized. Dissection was then carried out laterally over the facet complex and the transverse processes of L4 and L5 were exposed and decorticated bilaterally. We then proceeded with the decompression. The scar tissue around the previous right-sided laminotomy was carefully dissected, and the edges of the laminotomy were exposed. We performed a bilateral L4-5 laminotomy and facetectomy, decompressing the canal stenosis. Dissection was carried out the neural foramina, the L4 and L5 nerve roots were identified bilaterally. We then proceeded with the foraminotomies for the stenotic compression of the L4 and L5 nerve roots bilateral. Good decompression of the canal and nerve roots was achieved. We then proceeded with the posterior lumbar interbody arthrodesis. The annulus was incised bilaterally and the disc space entered. A thorough discectomy was performed using pituitary rongeurs and curettes. Once the discectomy was completed we began to prepare the endplate surfaces removing the cartilaginous endplates surface. We then measured the height of the intervertebral disc space. We selected 10 x 25 x 4 AVS peek interbody implants.  The C-arm fluoroscope was then draped and brought in the field and we identified the pedicle entry points bilaterally at the L4 and L5 levels. Each of the 4 pedicles was probed, we aspirated bone marrow aspirate from the vertebral bodies, this was injected over a 10 cc and a 5 cc strip of Vitoss BA. Then each of the pedicles was examined with the ball probe good bony surfaces were found and no bony cuts were found. Each of the pedicles was then tapped with a 5.25 mm tap,  again examined with the ball probe good threading was found  and no bony cuts were found. We then placed 5.75 by 40 mm millimeter screws at the L4 and L5 levels on the left side, and at the L5 level on the right side.  However when placing the screw on the right side at L4 the pedicle fractured, and the screw was therefore removed it was felt that we could not place a secure screw on the right side at L4. We therefore removed the right-sided screw at L5. An elective to place a spinous process plate.  We then packed the AVS peek interbody implants with Vitoss BA with bone marrow aspirate and infuse, and then placed the first implant and on the right side, carefully retracting the thecal sac and nerve root medially. We then went back to the left side and packed the midline with additional Vitoss BA with bone marrow aspirate and infuse and then placed a second implant and on the left side again retracting the thecal sac and nerve root medially. Additional Vitoss BA with bone marrow aspirate and infuse was packed lateral to the implants.  We then packed the lateral gutter over the transverse processes and intertransverse space with locally harvested morcellized autograft Vitoss BA with bone marrow aspirate, and infuse. We then selected a 35 mm pre-lordosed rod, it was placed within the screw heads on the left side, and secured with locking caps, once both locking caps were placed, final tightening was performed against a counter torque.  We then removed the L4-5 interspinous ligament, and selected a 45 mm spire plate. It was tightened down against the L4 and L5 spinous processes, and then the locking screw was tightened down and broken off. C-arm imaging showed the overall construct looked good.  The wound had been irrigated multiple times during the procedure with saline solution and bacitracin solution, good hemostasis was established with a combination of bipolar cautery and Gelfoam with thrombin. Once good  hemostasis was confirmed we spread 1 g of vancomycin powder against the soft tissues. We then proceeded with closure paraspinal muscles deep fascia and Scarpa's fascia were closed with interrupted undyed 1 Vicryl sutures the subcutaneous and subcuticular closed with interrupted inverted 2-0 undyed Vicryl sutures the skin edges were approximated with Dermabond.  Sterile gauze and Hypafix was used for dressing.  Following surgery the patient was turned back to the supine position to be reversed and the anesthetic extubated and transferred to the recovery room for further care.   PLAN OF CARE: Admit to inpatient   PATIENT DISPOSITION:  PACU - hemodynamically stable.   Delay start of Pharmacological VTE agent (>24hrs) due to surgical blood loss or risk of bleeding:  yes

## 2013-11-06 NOTE — Transfer of Care (Signed)
Immediate Anesthesia Transfer of Care Note  Patient: Douglas Carpenter  Procedure(s) Performed: Procedure(s) with comments: Lumbar four-five decompression with posterior lumbar interbody fusion with interbody prosthesis posterior lateral arthrodesis and posterior nonsegmental instrumentation (N/A) - Lumbar four-five decompression with posterior lumbar interbody fusion with interbody prosthesis posterior lateral arthrodesis and posterior nonsegmental instrumentation  Patient Location: PACU  Anesthesia Type:General  Level of Consciousness: awake, alert  and oriented  Airway & Oxygen Therapy: Patient Spontanous Breathing and Patient connected to nasal cannula oxygen  Post-op Assessment: Report given to PACU RN, Post -op Vital signs reviewed and stable and Patient moving all extremities X 4  Post vital signs: Reviewed and stable  Complications: No apparent anesthesia complications

## 2013-11-06 NOTE — Progress Notes (Signed)
Filed Vitals:   11/06/13 1545 11/06/13 1608 11/06/13 1640 11/06/13 2000  BP: 121/73  117/73 109/61  Pulse: 77 72 82 68  Temp:  98 F (36.7 C) 98.1 F (36.7 C) 97.9 F (36.6 C)  TempSrc:      Resp: 14 13 18 20   Height:      Weight:      SpO2: 100% 98% 95% 96%    Patient resting in bed, ambulated in the hall. Dressing clean and dry. Foley to straight drainage. Moving all extremity is well. Good relief of right lumbar radicular pain.  Plan: Encouraged to continue with ambulation in hall. We'll DC Foley in a.m. Continue to progress through postoperative recovery.  Hosie Spangle, MD 11/06/2013, 9:51 PM

## 2013-11-06 NOTE — Anesthesia Preprocedure Evaluation (Addendum)
Anesthesia Evaluation  Patient identified by MRN, date of birth, ID band Patient awake    Reviewed: Allergy & Precautions, H&P , NPO status , Patient's Chart, lab work & pertinent test results, reviewed documented beta blocker date and time   Airway Mallampati: II TM Distance: >3 FB Neck ROM: full    Dental  (+) Dental Advisory Given   Pulmonary asthma , sleep apnea , former smoker,  breath sounds clear to auscultation        Cardiovascular hypertension, Pt. on medications and Pt. on home beta blockers Rhythm:regular     Neuro/Psych  Headaches,  Neuromuscular disease negative psych ROS   GI/Hepatic Neg liver ROS, GERD-  Medicated and Controlled,  Endo/Other  Morbid obesity  Renal/GU negative Renal ROS  negative genitourinary   Musculoskeletal   Abdominal   Peds  Hematology negative hematology ROS (+)   Anesthesia Other Findings See surgeon's H&P   Reproductive/Obstetrics negative OB ROS                           Anesthesia Physical Anesthesia Plan  ASA: III  Anesthesia Plan: General   Post-op Pain Management:    Induction: Intravenous  Airway Management Planned: Oral ETT  Additional Equipment:   Intra-op Plan:   Post-operative Plan: Extubation in OR  Informed Consent: I have reviewed the patients History and Physical, chart, labs and discussed the procedure including the risks, benefits and alternatives for the proposed anesthesia with the patient or authorized representative who has indicated his/her understanding and acceptance.   Dental Advisory Given  Plan Discussed with: CRNA and Surgeon  Anesthesia Plan Comments:         Anesthesia Quick Evaluation

## 2013-11-06 NOTE — Anesthesia Postprocedure Evaluation (Signed)
Anesthesia Post Note  Patient: Douglas Carpenter  Procedure(s) Performed: Procedure(s) (LRB): Lumbar four-five decompression with posterior lumbar interbody fusion with interbody prosthesis posterior lateral arthrodesis and posterior nonsegmental instrumentation (N/A)  Anesthesia type: General  Patient location: PACU  Post pain: Pain level controlled  Post assessment: Patient's Cardiovascular Status Stable  Last Vitals:  Filed Vitals:   11/06/13 1400  BP: 100/65  Pulse: 65  Temp:   Resp: 12    Post vital signs: Reviewed and stable  Level of consciousness: alert  Complications: No apparent anesthesia complications

## 2013-11-06 NOTE — Anesthesia Procedure Notes (Signed)
Procedure Name: Intubation Date/Time: 11/06/2013 8:22 AM Performed by: Carola Frost Pre-anesthesia Checklist: Patient identified, Emergency Drugs available, Suction available, Patient being monitored and Timeout performed Patient Re-evaluated:Patient Re-evaluated prior to inductionOxygen Delivery Method: Circle system utilized Preoxygenation: Pre-oxygenation with 100% oxygen Intubation Type: IV induction and Cricoid Pressure applied Ventilation: Mask ventilation without difficulty Laryngoscope Size: Mac and 4 Grade View: Grade II Tube type: Oral Tube size: 7.5 mm Number of attempts: 1 Airway Equipment and Method: Stylet Placement Confirmation: CO2 detector,  positive ETCO2,  ETT inserted through vocal cords under direct vision and breath sounds checked- equal and bilateral Secured at: 23 cm Tube secured with: Tape Dental Injury: Teeth and Oropharynx as per pre-operative assessment

## 2013-11-06 NOTE — H&P (Signed)
Subjective: Patient is a 58 y.o. male who is admitted for treatment of right L4-5 neural foraminal stenosis and resultant right lumbar radiculopathy.  Patient is 11 months status post a right L4-5 lumbar laminotomy and microdiscectomy by Dr. Luiz Ochoa . He has had persistent right lumbar radicular pain from the right to his low back, into the right buttock, posterior thigh, calf, with occasional numbness in the lateral aspect of the right side. We've treated him with anti-inflammatory medications and transforaminal epidural steroid injections without lasting relief. Patient is admitted now for a bilateral L4-5 lumbar laminotomy, facetectomy, and foraminotomy, bilateral L4-5 posterior lumbar interbody arthrodesis with interbody implants and bone graft, and a bilateral L4-5 posterior lateral arthrodesis with posterior instrumentation patient and bone graft.   Patient Active Problem List   Diagnosis Date Noted  . Urethral stricture 01/07/2013  . Prostatic hypertrophy 11/12/2012  . VOCAL CORD DISORDER 02/19/2009  . HYPERLIPIDEMIA 01/27/2009  . OBSTRUCTIVE SLEEP APNEA 01/27/2009  . HYPERTENSION 01/27/2009  . CHRONIC RHINITIS 01/27/2009  . ASTHMA 01/27/2009  . GERD 01/27/2009   Past Medical History  Diagnosis Date  . Chronic rhinitis 01/27/2009  . HYPERLIPIDEMIA 01/27/2009  . HYPERTENSION 01/27/2009  . ED (erectile dysfunction)   . Testosterone deficiency   . GERD (gastroesophageal reflux disease)   . Urethral stricture   . Vocal cord anomaly     pt states told from [place]  doctor heard a little gurgle sound -- no farther testing done  . Mild obstructive sleep apnea     per pt -- mild osa test result--  no rx cpap needed  . BPH (benign prostatic hypertrophy)   . Mild asthma     seasonal allergies, uses Proair once per yr.   . Headache(784.0)     migraines, as many as 3 in a week    . Arthritis     "everywhere"    Past Surgical History  Procedure Laterality Date  . Lumbar laminectomy/decompression  microdiscectomy Right 11/29/2012    Procedure: Right Lumbar Four to Five Laminectomy/ Decompression Microdiskectomy;  Surgeon: Otilio Connors, MD;  Location: [facility];  Service: Neurosurgery;  Laterality: Right;  LUMBAR LAMINECTOMY/DECOMPRESSION MICRODISCECTOMY 1 LEVEL  . Shoulder arthroscopy w/ acromial repair Left 08-28-2002  . Cystoscopy with urethral dilatation N/A 01/07/2013    Procedure: CYSTOSCOPY WITH URETHRAL DILATATION BALLOON DILATION ;  Surgeon: Bernestine Amass, MD;  Location: [facility];  Service: Urology;  Laterality: N/A;    Prescriptions prior to admission  Medication Sig Dispense Refill  . aspirin EC 81 MG tablet Take 81 mg by mouth every morning.      . Aspirin-Acetaminophen-Caffeine (EXCEDRIN PO) Take 2 tablets by mouth 2 (two) times daily as needed (migraine).      Marland Kitchen atenolol (TENORMIN) 25 MG tablet Take 25 mg by mouth every morning.       Marland Kitchen atorvastatin (LIPITOR) 80 MG tablet Take 40 mg by mouth at bedtime.      . diazepam (VALIUM) 5 MG tablet Take 1 tablet (5 mg total) by mouth every 8 (eight) hours as needed (back pain).  15 tablet  0  . diclofenac (VOLTAREN) 75 MG EC tablet Take 75 mg by mouth 2 (two) times daily.       Marland Kitchen Ketotifen Fumarate (THERA TEARS ALLERGY OP) Place 1 drop into both eyes daily as needed (dry eyes).      . Omega-3 Fatty Acids (FISH OIL) 1000 MG CPDR Take 2,000 mg by mouth 2 (two) times daily.        Marland Kitchen  omeprazole (PRILOSEC) 20 MG capsule Take 20 mg by mouth daily.       Marland Kitchen oxyCODONE-acetaminophen (PERCOCET/ROXICET) 5-325 MG per tablet Take 1-2 tablets by mouth every 4 (four) hours as needed for pain.      Marland Kitchen topiramate (TOPAMAX) 25 MG tablet Take 25 mg by mouth daily.        Allergies  Allergen Reactions  . Aspirin Rash    Pt can tolerate low doses-- INTOLERANT TO HIGHER DOSES    History  Substance Use Topics  . Smoking status: Former Smoker -- 0.50 packs/day for 22 years    Types: Cigarettes    Quit date: 06/27/1988  . Smokeless  tobacco: Never Used  . Alcohol Use: 1.5 oz/week    3 drink(s) per week     Comment: 4-5 drinks / day- cognac     Family History  Problem Relation Age of Onset  . Cancer Mother     lung  . Heart disease Maternal Grandmother   . Heart disease Maternal Grandfather   . Cancer Maternal Grandfather     colon, prostate,lung     Review of Systems A comprehensive review of systems was negative.  Objective: Vital signs in last 24 hours: Temp:  [97.8 F (36.6 C)] 97.8 F (36.6 C) (05/13 0643) Resp:  [20] 20 (05/13 0643) BP: (121)/(76) 121/76 mmHg (05/13 0643) SpO2:  [96 %] 96 % (05/13 0643) Weight:  [98.884 kg (218 lb)] 98.884 kg (218 lb) (05/13 2831)  EXAM: Patient is a well-developed well-nourished black male in no acute distress. Lungs are clear to auscultation , the patient has symmetrical respiratory excursion. Heart has a regular rate and rhythm normal S1 and S2 no murmur.   Abdomen is soft nontender nondistended bowel sounds are present. Extremity examination shows no clubbing cyanosis or edema. Motor examination shows 5 over 5 strength in the lower extremities including the iliopsoas quadriceps dorsiflexor extensor hallicus  longus and plantar flexor bilaterally. Sensation is intact to pinprick in the distal lower extremities. Reflexes are symmetrical bilaterally. No pathologic reflexes are present. Patient has a normal gait and stance.   Data Review:CBC    Component Value Date/Time   WBC 4.1 10/30/2013 1115   RBC 4.52 10/30/2013 1115   HGB 13.8 10/30/2013 1115   HCT 40.4 10/30/2013 1115   PLT 210 10/30/2013 1115   MCV 89.4 10/30/2013 1115   MCH 30.5 10/30/2013 1115   MCHC 34.2 10/30/2013 1115   RDW 13.9 10/30/2013 1115   LYMPHSABS 1.3 11/29/2012 1027   MONOABS 0.3 11/29/2012 1027   EOSABS 0.2 11/29/2012 1027   BASOSABS 0.0 11/29/2012 1027                          BMET    Component Value Date/Time   NA 144 10/30/2013 1115   K 4.3 10/30/2013 1115   CL 108 10/30/2013 1115   CO2 22 10/30/2013 1115    GLUCOSE 103* 10/30/2013 1115   BUN 20 10/30/2013 1115   CREATININE 1.11 10/30/2013 1115   CALCIUM 9.1 10/30/2013 1115   GFRNONAA 71* 10/30/2013 1115   GFRAA 83* 10/30/2013 1115     Assessment/Plan: Patient with persistent right lumbar radiculopathy, secondary to right L4-5 neural foraminal stenosis. He is scheduled for a right L4-5 lumbar decompression and arthrodesis. I've discussed with the patient the nature of his condition, the nature the surgical procedure, the typical length of surgery, hospital stay, and overall recuperation, the limitations postoperatively, and  risks of surgery. I discussed risks including risks of infection, bleeding, possibly need for transfusion, the risk of nerve root dysfunction with pain, weakness, numbness, or paresthesias, the risk of dural tear and CSF leakage and possible need for further surgery, the risk of failure of the arthrodesis and possibly for further surgery, the risk of anesthetic complications including myocardial infarction, stroke, pneumonia, and death. We discussed the need for postoperative immobilization in a lumbar brace. Understanding all this the patient does wish to proceed with surgery and is admitted for such.     Hosie Spangle, MD 11/06/2013 8:18 AM

## 2013-11-07 MED FILL — Sodium Chloride IV Soln 0.9%: INTRAVENOUS | Qty: 2000 | Status: AC

## 2013-11-07 MED FILL — Heparin Sodium (Porcine) Inj 1000 Unit/ML: INTRAMUSCULAR | Qty: 30 | Status: AC

## 2013-11-07 NOTE — Progress Notes (Signed)
Filed Vitals:   11/06/13 1640 11/06/13 2000 11/06/13 2340 11/07/13 0453  BP: 117/73 109/61 98/66 109/68  Pulse: 82 68 75 83  Temp: 98.1 F (36.7 C) 97.9 F (36.6 C) 98.7 F (37.1 C) 100.4 F (38 C)  TempSrc:   Oral Oral  Resp: 18 20 18 18   Height:      Weight:      SpO2: 95% 96% 97% 100%    Patient resting in bed, ambulated earlier this morning. Dressing clean dry. Foley DC'd, but no voided yet. Nursing staff monitoring voiding function.  Plan: Encouraged to ambulate in the hall. We'll continue to progress through postoperative recovery.  Hosie Spangle, MD 11/07/2013, 7:56 AM

## 2013-11-08 MED ORDER — OXYCODONE-ACETAMINOPHEN 5-325 MG PO TABS: 1.0000 | ORAL_TABLET | ORAL | Status: DC | PRN

## 2013-11-08 MED ORDER — CYCLOBENZAPRINE HCL 10 MG PO TABS: 5.0000 mg | ORAL_TABLET | Freq: Three times a day (TID) | ORAL | Status: DC | PRN

## 2013-11-08 NOTE — Discharge Summary (Signed)
Physician Discharge Summary  Patient ID: Douglas Carpenter MRN: [id number] DOB/AGE: 07/14/1955 58 y.o.  Admit date: 11/06/2013 Discharge date: 11/08/2013  Admission Diagnoses:  Right L4-5 lateral recess stenosis, lumbar degenerative disc disease, lumbar spondylosis, right lumbar radiculopathy   Discharge Diagnoses:  Right L4-5 lateral recess stenosis, lumbar degenerative disc disease, lumbar spondylosis, right lumbar radiculopathy   Active Problems:   Lumbar stenosis   Discharged Condition: good  Hospital Course: Patient was admitted, underwent a L4-5 lumbar decompression and arthrodesis. Postoperatively he is making good progress. He is up and living actively in the halls. His incision is healing nicely. He is being discharged to home with instructions regarding wound care and activities. He is to return for followup with me in about 3 weeks.  Discharge Exam: Blood pressure 118/72, pulse 80, temperature 98.5 F (36.9 C), temperature source Oral, resp. rate 18, height 5\' 7"  (1.702 m), weight 98.884 kg (218 lb), SpO2 96.00%.  Disposition: Home     Medication List         aspirin EC 81 MG tablet  Take 81 mg by mouth every morning.     atenolol 25 MG tablet  Commonly known as:  TENORMIN  Take 25 mg by mouth every morning.     atorvastatin 80 MG tablet  Commonly known as:  LIPITOR  Take 40 mg by mouth at bedtime.     cyclobenzaprine 10 MG tablet  Commonly known as:  FLEXERIL  Take 0.5-1 tablets (5-10 mg total) by mouth 3 (three) times daily as needed for muscle spasms.     diazepam 5 MG tablet  Commonly known as:  VALIUM  Take 1 tablet (5 mg total) by mouth every 8 (eight) hours as needed (back pain).     diclofenac 75 MG EC tablet  Commonly known as:  VOLTAREN  Take 75 mg by mouth 2 (two) times daily.     EXCEDRIN PO  Take 2 tablets by mouth 2 (two) times daily as needed (migraine).     Fish Oil 1000 MG Cpdr  Take 2,000 mg by mouth 2 (two) times daily.     omeprazole  20 MG capsule  Commonly known as:  PRILOSEC  Take 20 mg by mouth daily.     oxyCODONE-acetaminophen 5-325 MG per tablet  Commonly known as:  PERCOCET/ROXICET  Take 1-2 tablets by mouth every 4 (four) hours as needed for pain.     oxyCODONE-acetaminophen 5-325 MG per tablet  Commonly known as:  PERCOCET/ROXICET  Take 1-2 tablets by mouth every 4 (four) hours as needed for moderate pain.     THERA TEARS ALLERGY OP  Place 1 drop into both eyes daily as needed (dry eyes).     topiramate 25 MG tablet  Commonly known as:  TOPAMAX  Take 25 mg by mouth daily.         Signed: Hosie Spangle, MD 11/08/2013, 7:29 AM

## 2013-11-08 NOTE — Discharge Instructions (Signed)
Wound Care °Leave incision open to air. °You may shower. °Do not scrub directly on incision.  °Do not put any creams, lotions, or ointments on incision. °Activity °Walk each and every day, increasing distance each day. °No lifting greater than 5 lbs.  Avoid bending, arching, and twisting. °No driving for 2 weeks; may ride as a passenger locally. °If provided with back brace, wear when out of bed.  It is not necessary to wear in bed. °Diet °Resume your normal diet.  °Return to Work °Will be discussed at you follow up appointment. °Call Your Doctor If Any of These Occur °Redness, drainage, or swelling at the wound.  °Temperature greater than 101 degrees. °Severe pain not relieved by pain medication. °Incision starts to come apart. °Follow Up Appt °Call today for appointment in 3 weeks ([phone]) or for problems.  If you have any hardware placed in your spine, you will need an x-ray before your appointment. ° ° °Spinal Fusion °Spinal fusion is a procedure to make 2 or more of the bones in your spinal column (vertebrae) grow together (fuse). This procedure stops movement between the vertebrae and can relieve pain and prevent deformity.  °Spinal fusion is used to treat the following conditions: °· Fractures of the spine. °· Herniated disk (the spongy material [cartilage] between the vertebrae). °· Abnormal curvatures of the spine, such as scoliosis or kyphosis. °· A weak or an unstable spine, caused by infections or tumor. °RISKS AND COMPLICATIONS °Complications associated with spinal fusion are rare, but they can occur. Possible complications include: °· Bleeding. °· Infection near the incision. °· Nerve damage. Signs of nerve damage are back pain, pain in one or both legs, weakness, or numbness. °· Spinal fluid leakage. °· Blood clot in your leg, which can move to your lungs. °· Difficulty controlling urination or bowel movements. °BEFORE THE PROCEDURE °· A medical evaluation will be done. This will include a physical  exam, blood tests, and imaging exams. °· You will talk with an anesthesiologist. This is the person who will be in charge of the anesthesia during the procedure. Spinal fusion usually requires that you are asleep during the procedure (general anesthesia). °· You will need to stop taking certain medicines, particularly those associated with an increased risk of bleeding. Ask your caregiver about changing or stopping your regular medicines. °· If you smoke, you will need to stop at least 2 weeks before the procedure. Smoking can slow down the healing process, especially fusion of the vertebrae, and increase the risk of complications. °· Do not eat or drink anything for at least 8 hours before the procedure. °PROCEDURE  °A cut (incision) is made over the vertebrae that will be fused. The back muscles are separated from the vertebrae. If you are having this procedure to treat a herniated disk, the disc material pressing on the nerve root is removed (decompression). The area where the disk is removed is then filled with extra bone. Bone from another part of your body (autogenous bone) or bone from a bone donor (allograft bone) may be used. The extra bone promotes fusion between the vertebrae. Sometimes, specific medicines are added to the fusion area to promote bone healing. In most cases, screws and rods or metal plates will be used to attach the vertebrae to stabilize them while they fuse.  °AFTER THE PROCEDURE  °· You will stay in a recovery area until the anesthesia has worn off. Your blood pressure and pulse will be checked frequently. °· You will be   will be given antibiotics to prevent infection. °· You may continue to receive fluids through an intravenous (IV) tube while you are still in the hospital. °· Pain after surgery is normal. You will be given pain medicine. °· You will be taught how to move correctly and how to stand and walk. While in bed, you will be instructed to turn frequently, using a "log rolling"  technique, in which the entire body is moved without twisting the back. °Document Released: 03/12/2003 Document Revised: 09/05/2011 Document Reviewed: 08/26/2010 °[employer]® Patient Information ©2014 [employer]. ° °  ° °

## 2013-11-08 NOTE — Progress Notes (Signed)
Pt doing very well. Pt and wife given D/C instructions with Rx's, verbal understanding of teaching was given. Pt's IV was removed prior to D/C. Pt D/C'd home via walking with wife @ 0815 per MD order. Pt is stable @ D/C and has no other needs. Holli Humbles, RN

## 2013-11-29 DIAGNOSIS — Z6834 Body mass index (BMI) 34.0-34.9, adult: Secondary | ICD-10-CM | POA: Diagnosis not present

## 2013-11-29 DIAGNOSIS — M5137 Other intervertebral disc degeneration, lumbosacral region: Secondary | ICD-10-CM | POA: Diagnosis not present

## 2013-11-29 DIAGNOSIS — M5126 Other intervertebral disc displacement, lumbar region: Secondary | ICD-10-CM | POA: Diagnosis not present

## 2013-11-29 DIAGNOSIS — M47817 Spondylosis without myelopathy or radiculopathy, lumbosacral region: Secondary | ICD-10-CM | POA: Diagnosis not present

## 2013-12-05 DIAGNOSIS — N35919 Unspecified urethral stricture, male, unspecified site: Secondary | ICD-10-CM | POA: Diagnosis not present

## 2013-12-05 DIAGNOSIS — N401 Enlarged prostate with lower urinary tract symptoms: Secondary | ICD-10-CM | POA: Diagnosis not present

## 2013-12-05 DIAGNOSIS — N3941 Urge incontinence: Secondary | ICD-10-CM | POA: Diagnosis not present

## 2013-12-05 DIAGNOSIS — N139 Obstructive and reflux uropathy, unspecified: Secondary | ICD-10-CM | POA: Diagnosis not present

## 2013-12-20 NOTE — OR Nursing (Signed)
Addendum to scope page 

## 2014-02-07 DIAGNOSIS — M47817 Spondylosis without myelopathy or radiculopathy, lumbosacral region: Secondary | ICD-10-CM | POA: Diagnosis not present

## 2014-02-07 DIAGNOSIS — M5137 Other intervertebral disc degeneration, lumbosacral region: Secondary | ICD-10-CM | POA: Diagnosis not present

## 2014-02-07 DIAGNOSIS — IMO0002 Reserved for concepts with insufficient information to code with codable children: Secondary | ICD-10-CM | POA: Diagnosis not present

## 2014-02-07 DIAGNOSIS — Z6834 Body mass index (BMI) 34.0-34.9, adult: Secondary | ICD-10-CM | POA: Diagnosis not present

## 2014-06-09 ENCOUNTER — Encounter: Payer: Self-pay | Admitting: Pulmonary Disease

## 2014-06-09 ENCOUNTER — Encounter (INDEPENDENT_AMBULATORY_CARE_PROVIDER_SITE_OTHER): Payer: Self-pay

## 2014-06-09 ENCOUNTER — Ambulatory Visit (INDEPENDENT_AMBULATORY_CARE_PROVIDER_SITE_OTHER): Payer: Medicare Other | Admitting: Pulmonary Disease

## 2014-06-09 ENCOUNTER — Ambulatory Visit (INDEPENDENT_AMBULATORY_CARE_PROVIDER_SITE_OTHER)
Admission: RE | Admit: 2014-06-09 | Discharge: 2014-06-09 | Disposition: A | Discharge status: Home / Self Care | Payer: Medicare Other | Source: Ambulatory Visit | Attending: Pulmonary Disease | Admitting: Pulmonary Disease

## 2014-06-09 VITALS — BP 122/80 | HR 100

## 2014-06-09 DIAGNOSIS — K219 Gastro-esophageal reflux disease without esophagitis: Secondary | ICD-10-CM | POA: Diagnosis not present

## 2014-06-09 DIAGNOSIS — R053 Chronic cough: Secondary | ICD-10-CM

## 2014-06-09 DIAGNOSIS — R05 Cough: Secondary | ICD-10-CM | POA: Diagnosis not present

## 2014-06-09 DIAGNOSIS — I10 Essential (primary) hypertension: Secondary | ICD-10-CM | POA: Diagnosis not present

## 2014-06-09 DIAGNOSIS — J45909 Unspecified asthma, uncomplicated: Secondary | ICD-10-CM | POA: Diagnosis not present

## 2014-06-09 DIAGNOSIS — R058 Other specified cough: Secondary | ICD-10-CM

## 2014-06-09 DIAGNOSIS — R0602 Shortness of breath: Secondary | ICD-10-CM | POA: Diagnosis not present

## 2014-06-09 DIAGNOSIS — G4733 Obstructive sleep apnea (adult) (pediatric): Secondary | ICD-10-CM | POA: Diagnosis not present

## 2014-06-09 MED ORDER — ALBUTEROL SULFATE 108 (90 BASE) MCG/ACT IN AEPB: 2.0000 | INHALATION_SPRAY | Freq: Four times a day (QID) | RESPIRATORY_TRACT | Status: DC | PRN

## 2014-06-09 MED ORDER — FLUTICASONE PROPIONATE 50 MCG/ACT NA SUSP: 2.0000 | Freq: Every day | NASAL | Status: DC

## 2014-06-09 NOTE — Progress Notes (Deleted)
   Subjective:    Patient ID: Douglas Carpenter, male    DOB: [birth year], 58 y.o.   MRN: [id number]  HPI    Review of Systems  Constitutional: Negative for fever and unexpected weight change.  HENT: Positive for congestion. Negative for dental problem, ear pain, nosebleeds, postnasal drip, rhinorrhea, sinus pressure, sneezing, sore throat and trouble swallowing.   Eyes: Negative for redness and itching.  Respiratory: Positive for shortness of breath. Negative for cough, chest tightness and wheezing.   Cardiovascular: Negative for palpitations and leg swelling.  Gastrointestinal: Negative for nausea and vomiting.       Indigestion  Genitourinary: Negative for dysuria.  Musculoskeletal: Positive for arthralgias. Negative for joint swelling.  Skin: Negative for rash.  Neurological: Positive for headaches.  Hematological: Does not bruise/bleed easily.  Psychiatric/Behavioral: Negative for dysphoric mood. The patient is not nervous/anxious.        Objective:   Physical Exam        Assessment & Plan:

## 2014-06-09 NOTE — Progress Notes (Signed)
Chief Complaint  Patient presents with  . PULMONARY CONSULT    Self referral for asthma/SOB. Pt c/o incr SOB x 1 year+    History of Present Illness: Douglas Carpenter is a 58 y.o. male for evaluation of asthma.  I had previously seen him in August 2010.  He most recently has been followed at the [place].  He has noticed more trouble with his breathing over the past 1 year.  He gets coughing and wheezing.  His wheeze comes from his chest and his throat.  He also has itchy eyes, and sinus congestion with post-nasal drip.  He has never had allergy testing done before.  He uses his wife's albuterol inhaler and this helped his cough.  He also uses his wife's allergy eyedrops and this helped his eye symptoms.  He has used nasal steroids before, and this helped when he used it.  He takes omperazole for GERD, and this helps.  He will frequently clear his throat, and feels like he has phlegm in his throat.  He denies skin rash, chest pain, gland swelling, or leg swelling.  His wife is concerned about his snoring.  He will also stop breathing while asleep.  He gets frequent nightmares, and feels sleepy during the day at times.  His Epworth score is 8 out of 24.  He had a home sleep study done at the [place] >> he was told he had mild sleep apnea, but didn't need to worry about this.  Tests: PFT 02/19/09 >> FEV1 2.90 (90%), FEV1% 70, TLC 5.62(93%), DLCO 99%, +BD  Douglas Carpenter  has a past medical history of Chronic rhinitis (01/27/2009); HYPERLIPIDEMIA (01/27/2009); HYPERTENSION (01/27/2009); ED (erectile dysfunction); Testosterone deficiency; GERD (gastroesophageal reflux disease); Urethral stricture; Vocal cord anomaly; Mild obstructive sleep apnea; BPH (benign prostatic hypertrophy); Mild asthma; Headache(784.0); and Arthritis.  Douglas Carpenter  has past surgical history that includes Lumbar laminectomy/decompression microdiscectomy (Right, 11/29/2012); Shoulder arthroscopy w/ acromial repair (Left, 08-28-2002); and  Cystoscopy with urethral dilatation (N/A, 01/07/2013).  Prior to Admission medications   Medication Sig Start Date End Date Taking? Authorizing Provider  aspirin EC 81 MG tablet Take 81 mg by mouth every morning.    Historical Provider, MD  Aspirin-Acetaminophen-Caffeine (EXCEDRIN PO) Take 2 tablets by mouth 2 (two) times daily as needed (migraine).    Historical Provider, MD  atenolol (TENORMIN) 25 MG tablet Take 25 mg by mouth every morning.     Historical Provider, MD  atorvastatin (LIPITOR) 80 MG tablet Take 40 mg by mouth at bedtime.    Historical Provider, MD  cyclobenzaprine (FLEXERIL) 10 MG tablet Take 0.5-1 tablets (5-10 mg total) by mouth 3 (three) times daily as needed for muscle spasms. 11/08/13   Hosie Spangle, MD  diazepam (VALIUM) 5 MG tablet Take 1 tablet (5 mg total) by mouth every 8 (eight) hours as needed (back pain). 11/10/12   Hannah Muthersbaugh, PA-C  diclofenac (VOLTAREN) 75 MG EC tablet Take 75 mg by mouth 2 (two) times daily.     Historical Provider, MD  Ketotifen Fumarate (THERA TEARS ALLERGY OP) Place 1 drop into both eyes daily as needed (dry eyes).    Historical Provider, MD  Omega-3 Fatty Acids (FISH OIL) 1000 MG CPDR Take 2,000 mg by mouth 2 (two) times daily.      Historical Provider, MD  omeprazole (PRILOSEC) 20 MG capsule Take 20 mg by mouth daily.     Historical Provider, MD  oxyCODONE-acetaminophen (PERCOCET/ROXICET) 5-325 MG per tablet Take  1-2 tablets by mouth every 4 (four) hours as needed for pain. 11/30/12   Otilio Connors, MD  oxyCODONE-acetaminophen (PERCOCET/ROXICET) 5-325 MG per tablet Take 1-2 tablets by mouth every 4 (four) hours as needed for moderate pain. 11/08/13   Hosie Spangle, MD  topiramate (TOPAMAX) 25 MG tablet Take 25 mg by mouth daily.     Historical Provider, MD    Allergies  Allergen Reactions  . Aspirin Rash    Pt can tolerate low doses-- INTOLERANT TO HIGHER DOSES    His family history includes Cancer in his maternal  grandfather and mother; Heart disease in his maternal grandfather and maternal grandmother.  He  reports that he quit smoking about 25 years ago. His smoking use included Cigarettes. He has a 11 pack-year smoking history. He has never used smokeless tobacco. He reports that he drinks about 1.8 oz of alcohol per week. He reports that he does not use illicit drugs.  Review of Systems  Constitutional: Negative for fever and unexpected weight change.  HENT: Positive for congestion. Negative for dental problem, ear pain, nosebleeds, postnasal drip, rhinorrhea, sinus pressure, sneezing, sore throat and trouble swallowing.   Eyes: Negative for redness and itching.  Respiratory: Positive for shortness of breath. Negative for cough, chest tightness and wheezing.   Cardiovascular: Negative for palpitations and leg swelling.  Gastrointestinal: Negative for nausea and vomiting.       Indigestion  Genitourinary: Negative for dysuria.  Musculoskeletal: Positive for arthralgias. Negative for joint swelling.  Skin: Negative for rash.  Neurological: Positive for headaches.  Hematological: Does not bruise/bleed easily.  Psychiatric/Behavioral: Negative for dysphoric mood. The patient is not nervous/anxious.    Physical Exam:  General - No distress ENT - No sinus tenderness, no oral exudate, no LAN, no thyromegaly, TM clear, pupils equal/reactive, wheeze over anterior neck Cardiac - s1s2 regular, no murmur, pulses symmetric Chest - No wheeze/rales/dullness, good air entry, normal respiratory excursion Back - No focal tenderness Abd - Soft, non-tender, no organomegaly, + bowel sounds Ext - No edema Neuro - Normal strength, cranial nerves intact Skin - No rashes Psych - Normal mood, and behavior    Lab Results  Component Value Date   WBC 4.1 10/30/2013   HGB 13.8 10/30/2013   HCT 40.4 10/30/2013   MCV 89.4 10/30/2013   PLT 210 10/30/2013    Lab Results  Component Value Date   CREATININE 1.11  10/30/2013   BUN 20 10/30/2013   NA 144 10/30/2013   K 4.3 10/30/2013   CL 108 10/30/2013   CO2 22 10/30/2013    Lab Results  Component Value Date   ALT 21 10/30/2013   AST 22 10/30/2013   ALKPHOS 47 10/30/2013   BILITOT 0.2* 10/30/2013     Assessment/Plan:  Chronic cough >> related to asthma, post-nasal drip, GERD, and vocal cord dysfunction. Plan: - advised him to avoid throat clearing - he should use sugarless candy to keep mouth moist - he should sip water when he has urge to cough - he can use salt water gargles ad lib prn - will check CXR  Asthma. Plan: - will repeat PFT  - will have him try albuterol >> [facility] which [facility] he can get - defer additional inhaler therapy for now  Upper airway cough syndrome with post-nasal drip. Plan: - discussed environmental controls to limit possible allergen exposure - advised him to use nasal irrigation and flonase daily -  he can use allegra daily for next one week, then as needed after  - defer additional allergy testing for now  GERD. Plan: - he is to continue omprezale  Vocal cord dysfunction. Plan: - if his symptoms persist, then he might need upper airway inspection with ENT  Obstructive sleep apnea. Plan: - will get copy of his recent sleep study from [place] and then determine if he needs additional therapy   [name], MD  Pulmonary/Critical Care/Sleep Pager:  [phone]

## 2014-06-09 NOTE — Patient Instructions (Signed)
Proair two puffs up to four times per day as needed Nasal irrigation (salt water nose spray) daily Flonase two sprays each nostril daily Use allegra daily for one week, then as needed Salt water gargles once or twice per day Avoid clearing your throat Sip water when you have urge to cough Chest xray today Will schedule breathing test (PFT) Please have sleep study sent from [place] Follow up in 6 weeks

## 2014-06-11 ENCOUNTER — Telehealth: Payer: Self-pay | Admitting: Pulmonary Disease

## 2014-06-11 NOTE — Telephone Encounter (Signed)
Dg Chest 2 View  06/09/2014   CLINICAL DATA:  Shortness of breath for 1 year, hypertension, chronic cough  EXAM: CHEST  2 VIEW  COMPARISON:  11/29/2012  FINDINGS: Cardiomediastinal silhouette is stable. No acute infiltrate or pleural effusion. No pulmonary edema. Again noted accessory azygos fissure/ lobe. Bony thorax is unremarkable.  IMPRESSION: No active cardiopulmonary disease.   Electronically Signed   By: Lahoma Crocker M.D.   On: 06/09/2014 11:39    Will have my nurse inform pt that CXR was normal.

## 2014-06-12 ENCOUNTER — Encounter (INDEPENDENT_AMBULATORY_CARE_PROVIDER_SITE_OTHER): Payer: Self-pay

## 2014-06-13 NOTE — Telephone Encounter (Signed)
Results have been explained to patient wife, pt expressed understanding. Nothing further needed.  

## 2014-07-21 ENCOUNTER — Ambulatory Visit (INDEPENDENT_AMBULATORY_CARE_PROVIDER_SITE_OTHER): Payer: Medicare Other | Admitting: Pulmonary Disease

## 2014-07-21 ENCOUNTER — Encounter: Payer: Self-pay | Admitting: Pulmonary Disease

## 2014-07-21 ENCOUNTER — Other Ambulatory Visit: Payer: Medicare Other

## 2014-07-21 VITALS — BP 118/80 | HR 74 | Temp 97.8°F | Ht 67.75 in | Wt 233.6 lb

## 2014-07-21 DIAGNOSIS — R05 Cough: Secondary | ICD-10-CM

## 2014-07-21 DIAGNOSIS — J3089 Other allergic rhinitis: Secondary | ICD-10-CM | POA: Diagnosis not present

## 2014-07-21 DIAGNOSIS — R053 Chronic cough: Secondary | ICD-10-CM

## 2014-07-21 DIAGNOSIS — J45909 Unspecified asthma, uncomplicated: Secondary | ICD-10-CM

## 2014-07-21 DIAGNOSIS — J309 Allergic rhinitis, unspecified: Secondary | ICD-10-CM | POA: Insufficient documentation

## 2014-07-21 DIAGNOSIS — J45990 Exercise induced bronchospasm: Secondary | ICD-10-CM

## 2014-07-21 DIAGNOSIS — J4530 Mild persistent asthma, uncomplicated: Secondary | ICD-10-CM | POA: Diagnosis not present

## 2014-07-21 DIAGNOSIS — R0683 Snoring: Secondary | ICD-10-CM

## 2014-07-21 DIAGNOSIS — K219 Gastro-esophageal reflux disease without esophagitis: Secondary | ICD-10-CM | POA: Diagnosis not present

## 2014-07-21 DIAGNOSIS — R058 Other specified cough: Secondary | ICD-10-CM

## 2014-07-21 LAB — PULMONARY FUNCTION TEST
DL/VA % pred: 127 %
DL/VA: 5.71 ml/min/mmHg/L
DLCO unc % pred: 106 %
DLCO unc: 31.17 ml/min/mmHg
FEF 25-75 Post: 3 L/sec
FEF 25-75 Pre: 1.57 L/sec
FEF2575-%Change-Post: 91 %
FEF2575-%Pred-Post: 109 %
FEF2575-%Pred-Pre: 57 %
FEV1-%Change-Post: 23 %
FEV1-%Pred-Post: 93 %
FEV1-%Pred-Pre: 75 %
FEV1-Post: 2.7 L
FEV1-Pre: 2.18 L
FEV1FVC-%Change-Post: 2 %
FEV1FVC-%Pred-Pre: 92 %
FEV6-%Change-Post: 21 %
FEV6-%Pred-Post: 102 %
FEV6-%Pred-Pre: 84 %
FEV6-Post: 3.64 L
FEV6-Pre: 3 L
FEV6FVC-%Change-Post: 0 %
FEV6FVC-%Pred-Post: 103 %
FEV6FVC-%Pred-Pre: 104 %
FVC-%Change-Post: 20 %
FVC-%Pred-Post: 98 %
FVC-%Pred-Pre: 81 %
FVC-Post: 3.66 L
FVC-Pre: 3.03 L
Post FEV1/FVC ratio: 74 %
Post FEV6/FVC ratio: 100 %
Pre FEV1/FVC ratio: 72 %
Pre FEV6/FVC Ratio: 100 %
RV % pred: 126 %
RV: 2.65 L
TLC % pred: 91 %
TLC: 5.97 L

## 2014-07-21 MED ORDER — MONTELUKAST SODIUM 10 MG PO TABS: 10.0000 mg | ORAL_TABLET | Freq: Every day | ORAL | Status: DC

## 2014-07-21 NOTE — Progress Notes (Signed)
PFT done today. 

## 2014-07-21 NOTE — Progress Notes (Signed)
Chief Complaint  Patient presents with  . Follow-up    Review PFT. Denies any breathing issues other than wheezing at times.     History of Present Illness: Douglas Carpenter is a 59 y.o. male former smoker with chronic cough in setting of Asthma, Post nasal drip with allergies, GERD, and vocal cord disease.  He also has hx of snoring.  He had PFTs earlier today >> showed positive bronchodilator response with mild air trapping.  He has been using his wife's albuterol several times per week >> this helps.  His wife thinks he should use inhaler more often, but he is used to have trouble with his breathing and just works through this.  His wheezing and cough are worse at night and early morning.  He also has trouble when he mows the lawn.  He thinks he might be having trouble from his dog and dander >> dog sheds a lot.  His home sleep study from [place] in 02/2013 showed AHI of 3.9.  His wife is more worried about his sleep >> he does not feel like he has problem with his sleep.   His CXR from 06/09/14 was normal.   TESTS: HST 03/07/13 >> AHI 3.9, SaO2 low 80% PFT 02/19/09 >> FEV1 2.90 (90%), FEV1% 70, TLC 5.62(93%), DLCO 99%, +BD PFT 07/21/14 >> FEV1 2.70 (93%), FEV1% 74, TLC 5.97 (91%), RV 2.65 (126%), DLCO 106%, +BD  Past medical hx >> HLD, HTN, GERD, BPH, HA, Osteoarthritis  Past surgical hx, Medications, Allergies, Family hx, Social hx all reviewed.   Physical Exam: Blood pressure 118/80, pulse 74, temperature 97.8 F (36.6 C), temperature source Oral, height 5' 7.75" (1.721 m), weight 233 lb 9.6 oz (105.96 kg), SpO2 96 %. Body mass index is 35.78 kg/(m^2).  General - No distress ENT - No sinus tenderness, no oral exudate, no LAN Cardiac - s1s2 regular, no murmur Chest - No wheeze/rales/dullness Back - No focal tenderness Abd - Soft, non-tender Ext - No edema Neuro - Normal strength Skin - No rashes Psych - normal mood, and behavior   Assessment/Plan:  Chronic cough >> related to  asthma, post-nasal drip, GERD, and vocal cord dysfunction. Plan: - advised him to avoid throat clearing - he should use sugarless candy to keep mouth moist - he should sip water when he has urge to cough - he can use salt water gargles ad lib prn  Asthma. Plan: - will try him on montelukast >> would like to avoid ICS with hx of vocal cord disease - continue prn albuterol  Exercise induced asthma. Plan: - montelukast - discussed doing brief, high intensity warm up 15 minutes prior to exercise >> can also use albuterol prior to exercise  Upper airway cough syndrome with post-nasal drip and allergic rhinitis. Plan: - discussed environmental controls to limit possible allergen exposure - advised him to use nasal irrigation and flonase daily - he can use allegra as needed - montelukast should help with this - will get RAST with IgE  GERD. Plan: - he is to continue omprezale  Vocal cord dysfunction. Plan: - monitor clinically  Snoring. HST from 02/2013 showed borderline apnea.  He does not feel his sleep is much of an issue at present. Plan: - monitor clinically - if his sleep gets worse, then he would need repeat sleep study >> this would need to be done in sleep lab   [name], MD [place] Pager:  [phone]

## 2014-07-21 NOTE — Patient Instructions (Signed)
Montelukast (singulair) 10 mg pill nightly Blood test today Follow up in 3 months

## 2014-07-22 LAB — ALLERGY FULL PROFILE
Allergen, D pternoyssinus,d7: <0.1 kU/L
Allergen,Goose feathers, e70: <0.1 kU/L
Alternaria Alternata: <0.1 kU/L
Aspergillus fumigatus, m3: <0.1 kU/L
Bahia Grass: 0.21 kU/L — ABNORMAL HIGH
Bermuda Grass: <0.1 kU/L
Box Elder IgE: <0.1 kU/L
Candida Albicans: <0.1 kU/L
Cat Dander: <0.1 kU/L
Common Ragweed: <0.1 kU/L
Curvularia lunata: <0.1 kU/L
D. farinae: 0.1 kU/L — ABNORMAL HIGH
Dog Dander: <0.1 kU/L
Elm IgE: <0.1 kU/L
Fescue: 0.33 kU/L — ABNORMAL HIGH
G005 Rye, Perennial: 0.31 kU/L — ABNORMAL HIGH
G009 Red Top: 0.34 kU/L — ABNORMAL HIGH
Goldenrod: <0.1 kU/L
Helminthosporium halodes: <0.1 kU/L
House Dust Hollister: <0.1 kU/L
IgE (Immunoglobulin E), Serum: 49 kU/L (ref <115)
Lamb's Quarters: <0.1 kU/L
Oak: <0.1 kU/L
Plantain: <0.1 kU/L
Stemphylium Botryosum: <0.1 kU/L
Sycamore Tree: <0.1 kU/L
Timothy Grass: 0.27 kU/L — ABNORMAL HIGH

## 2014-07-25 ENCOUNTER — Telehealth: Payer: Self-pay | Admitting: Pulmonary Disease

## 2014-07-25 NOTE — Telephone Encounter (Signed)
lmtcb

## 2014-07-25 NOTE — Telephone Encounter (Signed)
RAST 07/21/14 >> react to dust mites, grasses; IgE 49  Will have my nurse inform pt that allergy test showed reaction to dust mites and several different grasses.  He did not have reaction to cat or dog dander.  He should continue singulair and prn allegra.  No other changes to tx at this time.

## 2014-07-29 NOTE — Telephone Encounter (Signed)
Patient notified.  No questions or concerns at this time. Nothing further needed.   

## 2014-08-01 DIAGNOSIS — N401 Enlarged prostate with lower urinary tract symptoms: Secondary | ICD-10-CM | POA: Diagnosis not present

## 2014-08-01 DIAGNOSIS — R351 Nocturia: Secondary | ICD-10-CM | POA: Diagnosis not present

## 2014-08-01 DIAGNOSIS — R3912 Poor urinary stream: Secondary | ICD-10-CM | POA: Diagnosis not present

## 2014-08-01 DIAGNOSIS — N359 Urethral stricture, unspecified: Secondary | ICD-10-CM | POA: Diagnosis not present

## 2014-08-08 ENCOUNTER — Ambulatory Visit (INDEPENDENT_AMBULATORY_CARE_PROVIDER_SITE_OTHER): Payer: Medicare Other | Admitting: Emergency Medicine

## 2014-08-08 ENCOUNTER — Ambulatory Visit (INDEPENDENT_AMBULATORY_CARE_PROVIDER_SITE_OTHER): Payer: Medicare Other

## 2014-08-08 VITALS — BP 134/88 | HR 89 | Temp 99.0°F | Resp 17 | Ht 68.5 in | Wt 236.0 lb

## 2014-08-08 DIAGNOSIS — R0602 Shortness of breath: Secondary | ICD-10-CM

## 2014-08-08 LAB — POCT INFLUENZA A/B
Influenza A, POC: NEGATIVE
Influenza B, POC: NEGATIVE

## 2014-08-08 LAB — POCT CBC
Granulocyte percent: 64.1 %G (ref 37–80)
HCT, POC: 43.6 % (ref 43.5–53.7)
Hemoglobin: 14 g/dL — AB (ref 14.1–18.1)
Lymph, poc: 1.1 (ref 0.6–3.4)
MCH, POC: 29 pg (ref 27–31.2)
MCHC: 32.1 g/dL (ref 31.8–35.4)
MCV: 90.4 fL (ref 80–97)
MID (cbc): 0.4 (ref 0–0.9)
MPV: 7.3 fL (ref 0–99.8)
POC Granulocyte: 2.7 (ref 2–6.9)
POC LYMPH PERCENT: 25.8 %L (ref 10–50)
POC MID %: 10.1 %M (ref 0–12)
Platelet Count, POC: 190 10*3/uL (ref 142–424)
RBC: 4.83 M/uL (ref 4.69–6.13)
RDW, POC: 16.2 %
WBC: 4.2 10*3/uL — AB (ref 4.6–10.2)

## 2014-08-08 MED ORDER — IPRATROPIUM BROMIDE 0.02 % IN SOLN
0.5000 mg | Freq: Once | RESPIRATORY_TRACT | Status: AC
  Administered 2014-08-08: 0.5 mg via RESPIRATORY_TRACT

## 2014-08-08 MED ORDER — ALBUTEROL SULFATE (2.5 MG/3ML) 0.083% IN NEBU
2.5000 mg | INHALATION_SOLUTION | Freq: Once | RESPIRATORY_TRACT | Status: AC
  Administered 2014-08-08: 2.5 mg via RESPIRATORY_TRACT

## 2014-08-08 MED ORDER — ALBUTEROL SULFATE (2.5 MG/3ML) 0.083% IN NEBU: 2.5000 mg | INHALATION_SOLUTION | Freq: Once | RESPIRATORY_TRACT | Status: DC

## 2014-08-08 MED ORDER — ALBUTEROL SULFATE HFA 108 (90 BASE) MCG/ACT IN AERS: 2.0000 | INHALATION_SPRAY | Freq: Four times a day (QID) | RESPIRATORY_TRACT | Status: DC | PRN

## 2014-08-08 MED ORDER — PREDNISONE 20 MG PO TABS: ORAL_TABLET | ORAL | Status: DC

## 2014-08-08 NOTE — Progress Notes (Addendum)
Subjective:    Patient ID: Douglas Carpenter, male    DOB: [birth year], 59 y.o.   MRN: [id number]  This chart was scribed for Douglas Reichmann, MD, by Stephania Fragmin, ED Scribe. This patient was seen in room 9 and the patient's care was started at 10:38 AM.   HPI   The history is provided by the patient.   HPI Comments: Douglas Carpenter is a 59 y.o. male with a history of asthma diagnosed 1 month ago who presents to the [facility] complaining of cold-like symptoms that began 1 month ago that went away at one point, and then returned. He complains of congestion, cough with brown sputum, chills, and sleep disturbances due to his cough and congestion. He coughs up phlegm every 10-15 minutes, which tastes bad. He had gone to the [place] 1 month ago, where he was told to ride it out. They didn't do a CXR or any other tests. His wife also got sick, but she got better in 2 days. Patient uses albuterol for asthma, but he didn't use any this morning. He denies a history of any other pulmonary problems besides asthma. He denies smoking. Patient had a flu and pneumonia vaccine this year. He has NKDA.  Patient Active Problem List   Diagnosis Date Noted  . Exercise-induced asthma 07/21/2014  . Chronic cough 07/21/2014  . Upper airway cough syndrome 07/21/2014  . Allergic rhinitis 07/21/2014  . Lumbar stenosis 11/06/2013  . Urethral stricture 01/07/2013  . Prostatic hypertrophy 11/12/2012  . VOCAL CORD DISORDER 02/19/2009  . HYPERLIPIDEMIA 01/27/2009  . Snoring 01/27/2009  . HYPERTENSION 01/27/2009  . CHRONIC RHINITIS 01/27/2009  . Asthma 01/27/2009  . GERD 01/27/2009   Past Medical History  Diagnosis Date  . Chronic rhinitis 01/27/2009  . HYPERLIPIDEMIA 01/27/2009  . HYPERTENSION 01/27/2009  . ED (erectile dysfunction)   . Testosterone deficiency   . GERD (gastroesophageal reflux disease)   . Urethral stricture   . Vocal cord anomaly     pt states told from [place]  doctor heard a little gurgle  sound -- no farther testing done  . Mild obstructive sleep apnea     per pt -- mild osa test result--  no rx cpap needed  . BPH (benign prostatic hypertrophy)   . Mild asthma     seasonal allergies, uses Proair once per yr.   . Headache(784.0)     migraines, as many as 3 in a week    . Arthritis     "everywhere"   Past Surgical History  Procedure Laterality Date  . Lumbar laminectomy/decompression microdiscectomy Right 11/29/2012    Procedure: Right Lumbar Four to Five Laminectomy/ Decompression Microdiskectomy;  Surgeon: Otilio Connors, MD;  Location: [place] [facility];  Service: Neurosurgery;  Laterality: Right;  LUMBAR LAMINECTOMY/DECOMPRESSION MICRODISCECTOMY 1 LEVEL  . Shoulder arthroscopy w/ acromial repair Left 08-28-2002  . Cystoscopy with urethral dilatation N/A 01/07/2013    Procedure: CYSTOSCOPY WITH URETHRAL DILATATION BALLOON DILATION ;  Surgeon: Bernestine Amass, MD;  Location: [facility];  Service: Urology;  Laterality: N/A;   Allergies  Allergen Reactions  . Aspirin Rash    Pt can tolerate low doses-- INTOLERANT TO HIGHER DOSES   Prior to Admission medications   Medication Sig Start Date End Date Taking? Authorizing Provider  Albuterol Sulfate (PROAIR RESPICLICK) 824 (90 BASE) MCG/ACT AEPB Inhale 2 puffs into the lungs every 6 (six) hours as needed. 06/09/14  Yes [name], MD  aspirin EC 81 MG tablet Take 81 mg by mouth every morning.   Yes Historical Provider, MD  Aspirin-Acetaminophen-Caffeine (EXCEDRIN PO) Take 2 tablets by mouth 2 (two) times daily as needed (migraine).   Yes Historical Provider, MD  atenolol (TENORMIN) 25 MG tablet Take 25 mg by mouth every morning.    Yes Historical Provider, MD  atorvastatin (LIPITOR) 80 MG tablet Take 40 mg by mouth at bedtime.   Yes Historical Provider, MD  cyclobenzaprine (FLEXERIL) 10 MG tablet Take 0.5-1 tablets (5-10 mg total) by mouth 3 (three) times daily as needed for muscle spasms. 11/08/13  Yes Hosie Spangle, MD  diazepam (VALIUM) 5 MG tablet Take 1 tablet (5 mg total) by mouth every 8 (eight) hours as needed (back pain). 11/10/12  Yes Hannah Muthersbaugh, PA-C  diclofenac (VOLTAREN) 75 MG EC tablet Take 75 mg by mouth 2 (two) times daily.    Yes Historical Provider, MD  fluticasone (FLONASE) 50 MCG/ACT nasal spray Place 2 sprays into both nostrils daily. 06/09/14  Yes [name], MD  Ketotifen Fumarate (THERA TEARS ALLERGY OP) Place 1 drop into both eyes daily as needed (dry eyes).   Yes Historical Provider, MD  montelukast (SINGULAIR) 10 MG tablet Take 1 tablet (10 mg total) by mouth at bedtime. 07/21/14  Yes [name], MD  Omega-3 Fatty Acids (FISH OIL) 1000 MG CPDR Take 2,000 mg by mouth 2 (two) times daily.     Yes Historical Provider, MD  omeprazole (PRILOSEC) 20 MG capsule Take 20 mg by mouth daily.    Yes Historical Provider, MD  topiramate (TOPAMAX) 25 MG tablet Take 25 mg by mouth daily.    Yes Historical Provider, MD   History   Social History  . Marital Status: Married    Spouse Name: N/A  . Number of Children: N/A  . Years of Education: N/A   Occupational History  . retired    Social History Main Topics  . Smoking status: Former Smoker -- 0.50 packs/day for 22 years    Types: Cigarettes    Quit date: 06/27/1988  . Smokeless tobacco: Never Used  . Alcohol Use: 1.8 oz/week    3 Standard drinks or equivalent per week     Comment: 4-5 drinks / day- cognac   . Drug Use: No  . Sexual Activity: Not on file   Other Topics Concern  . Not on file   Social History Narrative       Review of Systems  Constitutional: Positive for fever and chills.  HENT: Positive for congestion.   Respiratory: Positive for cough, shortness of breath and wheezing.       Objective:   Physical Exam  Nursing note and vitals reviewed.  CONSTITUTIONAL: Well developed/well nourished HEAD: Normocephalic/atraumatic EYES: EOMI/PERRL ENMT: Mucous membranes moist; Significant nasal  congestion NECK: supple no meningeal signs SPINE/BACK:entire spine nontender CV: S1/S2 noted, no murmurs/rubs/gallops noted; Regular rate and rhythm  LUNGS: Bilateral wheezing with prolonged expiration, breath sounds are symmetrical, no apparent distress ABDOMEN: soft, nontender, no rebound or guarding, bowel sounds noted throughout abdomen GU:no cva tenderness NEURO: Pt is awake/alert/appropriate, moves all extremitiesx4.  No facial droop.   EXTREMITIES: pulses normal/equal, full ROM SKIN: warm, color normal PSYCH: no abnormalities of mood noted, alert and oriented to situation   Primary X-Ray Reading by Dr. Everlene Farrier at [facility]: Azygous lobe is present. There are no definite pneumonic(pneumonia-like) infiltrates seen. Results for orders placed or performed in visit on 08/08/14  POCT CBC  Result  Value Ref Range   WBC 4.2 (A) 4.6 - 10.2 K/uL   Lymph, poc 1.1 0.6 - 3.4   POC LYMPH PERCENT 25.8 10 - 50 %L   MID (cbc) 0.4 0 - 0.9   POC MID % 10.1 0 - 12 %M   POC Granulocyte 2.7 2 - 6.9   Granulocyte percent 64.1 37 - 80 %G   RBC 4.83 4.69 - 6.13 M/uL   Hemoglobin 14.0 (A) 14.1 - 18.1 g/dL   HCT, POC 43.6 43.5 - 53.7 %   MCV 90.4 80 - 97 fL   MCH, POC 29.0 27 - 31.2 pg   MCHC 32.1 31.8 - 35.4 g/dL   RDW, POC 16.2 %   Platelet Count, POC 190 142 - 424 K/uL   MPV 7.3 0 - 99.8 fL  POCT Influenza A/B  Result Value Ref Range   Influenza A, POC     Influenza B, POC        Assessment & Plan:   Patient is regularly seen in pulmonary by Dr. Halford Chessman. Radiologist does not see a definite pneumonia. He did respond well to breathing treatments. His peak flow is now up to 400.

## 2014-08-08 NOTE — Patient Instructions (Signed)

## 2014-08-15 DIAGNOSIS — Z981 Arthrodesis status: Secondary | ICD-10-CM | POA: Diagnosis not present

## 2014-08-15 DIAGNOSIS — M47816 Spondylosis without myelopathy or radiculopathy, lumbar region: Secondary | ICD-10-CM | POA: Diagnosis not present

## 2014-08-15 DIAGNOSIS — I10 Essential (primary) hypertension: Secondary | ICD-10-CM | POA: Diagnosis not present

## 2014-08-15 DIAGNOSIS — Z6837 Body mass index (BMI) 37.0-37.9, adult: Secondary | ICD-10-CM | POA: Diagnosis not present

## 2014-08-15 DIAGNOSIS — M5136 Other intervertebral disc degeneration, lumbar region: Secondary | ICD-10-CM | POA: Diagnosis not present

## 2014-08-20 ENCOUNTER — Encounter: Payer: Self-pay | Admitting: Adult Health

## 2014-08-20 ENCOUNTER — Ambulatory Visit (INDEPENDENT_AMBULATORY_CARE_PROVIDER_SITE_OTHER): Payer: Medicare Other | Admitting: Adult Health

## 2014-08-20 VITALS — BP 148/88 | HR 79 | Temp 98.0°F | Ht 67.0 in | Wt 229.0 lb

## 2014-08-20 DIAGNOSIS — J4530 Mild persistent asthma, uncomplicated: Secondary | ICD-10-CM | POA: Diagnosis not present

## 2014-08-20 DIAGNOSIS — J3089 Other allergic rhinitis: Secondary | ICD-10-CM | POA: Diagnosis not present

## 2014-08-20 NOTE — Patient Instructions (Signed)
Restart Singulair  10 mg daily Mucinex DM twice daily as needed for cough and congestion May use Zyrtec 10 mg at bedtime as needed for drainage Follow-up with Dr. Halford Chessman  in 3 months and as needed

## 2014-08-20 NOTE — Assessment & Plan Note (Signed)
Recent flare, now resolved  Plan  Restart Singulair  10 mg daily Mucinex DM twice daily as needed for cough and congestion May use Zyrtec 10 mg at bedtime as needed for drainage Follow-up with Dr. Halford Chessman  in 3 months and as needed

## 2014-08-20 NOTE — Assessment & Plan Note (Signed)
Recent flare, resolving  Plan  Restart Singulair  10 mg daily Mucinex DM twice daily as needed for cough and congestion May use Zyrtec 10 mg at bedtime as needed for drainage Follow-up with Dr. Halford Chessman  in 3 months and as needed

## 2014-08-20 NOTE — Progress Notes (Signed)
   Subjective:    Patient ID: Douglas Carpenter, male    DOB: [birth year], 58 y.o.   MRN: [id number]  HPI  History of Present Illness: Douglas Carpenter is a 59 y.o. male former smoker with chronic cough in setting of Asthma, Post nasal drip with allergies, GERD, and vocal cord disease.  He also has hx of snoring.   08/20/2014 follow-up Asthma Flare  Returns for a follow up for recent asthma flare  Seen  couple weeks ago he had an asthma attack and was seen by PCP- pt was given neb treatment and pred taper. Pt was advised to f/u with pulmonary.  He has finished pred pack .  Starting to feel better. Cough and congestion are improved  Mucus is white to clear. No fever or hemotpysisi .  Still has some lingering symptoms  Felt he had a cold to begin with.  He denies any chest pain, orthopnea, PND or leg swelling Had IgE and rest test last visit-positive for dog and grass. IgE was 49.     TESTS: HST 03/07/13 >> AHI 3.9, SaO2 low 80% PFT 02/19/09 >> FEV1 2.90 (90%), FEV1% 70, TLC 5.62(93%), DLCO 99%, +BD PFT 07/21/14 >> FEV1 2.70 (93%), FEV1% 74, TLC 5.97 (91%), RV 2.65 (126%), DLCO 106%, +BD CXR 06/09/14 normal   Review of Systems Constitutional:   No  weight loss, night sweats,  Fevers, chills, fatigue, or  lassitude.  HEENT:   No headaches,  Difficulty swallowing,  Tooth/dental problems, or  Sore throat,                No sneezing, itching, ear ache,  +nasal congestion, post nasal drip,   CV:  No chest pain,  Orthopnea, PND, swelling in lower extremities, anasarca, dizziness, palpitations, syncope.   GI  No heartburn, indigestion, abdominal pain, nausea, vomiting, diarrhea, change in bowel habits, loss of appetite, bloody stools.   Resp:  No chest wall deformity  Skin: no rash or lesions.  GU: no dysuria, change in color of urine, no urgency or frequency.  No flank pain, no hematuria   MS:  No joint pain or swelling.  No decreased range of motion.  No back pain.  Psych:  No change in  mood or affect. No depression or anxiety.  No memory loss.         Objective:   Physical Exam GEN: A/Ox3; pleasant , NAD, well nourished   HEENT:  [place]/AT,  EACs-clear, TMs-wnl, NOSE-clear, THROAT-clear, no lesions, no postnasal drip or exudate noted.   NECK:  Supple w/ fair ROM; no JVD; normal carotid impulses w/o bruits; no thyromegaly or nodules palpated; no lymphadenopathy.  RESP  Clear  P & A; w/o, wheezes/ rales/ or rhonchi.no accessory muscle use, no dullness to percussion  CARD:  RRR, no m/r/g  , no peripheral edema, pulses intact, no cyanosis or clubbing.  GI:   Soft & nt; nml bowel sounds; no organomegaly or masses detected.  Musco: Warm bil, no deformities or joint swelling noted.   Neuro: alert, no focal deficits noted.    Skin: Warm, no lesions or rashes         Assessment & Plan:

## 2014-08-21 NOTE — Progress Notes (Signed)
Reviewed and agree with assessment/plan. 

## 2014-11-19 ENCOUNTER — Ambulatory Visit (INDEPENDENT_AMBULATORY_CARE_PROVIDER_SITE_OTHER): Payer: Medicare Other | Admitting: Pulmonary Disease

## 2014-11-19 ENCOUNTER — Encounter: Payer: Self-pay | Admitting: Pulmonary Disease

## 2014-11-19 VITALS — BP 110/64 | HR 73 | Ht 67.0 in | Wt 225.0 lb

## 2014-11-19 DIAGNOSIS — J4530 Mild persistent asthma, uncomplicated: Secondary | ICD-10-CM | POA: Diagnosis not present

## 2014-11-19 DIAGNOSIS — R05 Cough: Secondary | ICD-10-CM | POA: Diagnosis not present

## 2014-11-19 DIAGNOSIS — J3089 Other allergic rhinitis: Secondary | ICD-10-CM | POA: Diagnosis not present

## 2014-11-19 DIAGNOSIS — R058 Other specified cough: Secondary | ICD-10-CM

## 2014-11-19 DIAGNOSIS — R053 Chronic cough: Secondary | ICD-10-CM

## 2014-11-19 MED ORDER — BECLOMETHASONE DIPROPIONATE 80 MCG/ACT IN AERS: 2.0000 | INHALATION_SPRAY | Freq: Two times a day (BID) | RESPIRATORY_TRACT | Status: DC

## 2014-11-19 NOTE — Patient Instructions (Signed)
Qvar two puffs twice per day >> rinse mouth after each use Singulair 10 mg pill nightly Albuterol two puffs up to four times per day as needed for cough, wheeze, or chest congestion Flonase two sprays each nostril daily as needed for runny nose or sinus congestion Follow up in 3 months

## 2014-11-19 NOTE — Progress Notes (Signed)
Chief Complaint  Patient presents with  . Follow-up    Pt states that breathing has been up and down since Feb. using albuterol more frequently for SOB, depends on weather and activity. Would like to discuss getting a nebulizer and meds for home tx.     History of Present Illness: Douglas Carpenter is a 59 y.o. male former smoker with chronic cough in setting of Asthma, Post nasal drip with allergies, GERD, and vocal cord disease.  He also has hx of snoring.  He was seen by Rexene Edison in February for asthma exacerbation.  He was restarted on singulair.  This has helped.  His wife is concerned he still gets wheezing and short of breath.  This especially happens when he mows the grass.  He will wear a mask when working outside.  His not having sinus congestion, post nasal drip, sore throat, hoarseness, or reflux.  He does not use flonase on a regular basis.    He is using albuterol several times per week.   TESTS: HST 03/07/13 >> AHI 3.9, SaO2 low 80% PFT 02/19/09 >> FEV1 2.90 (90%), FEV1% 70, TLC 5.62(93%), DLCO 99%, +BD PFT 07/21/14 >> FEV1 2.70 (93%), FEV1% 74, TLC 5.97 (91%), RV 2.65 (126%), DLCO 106%, +BD RAST 07/21/14 >> react to dust mites, grasses; IgE 49   Past medical hx >> HLD, HTN, GERD, BPH, HA, Osteoarthritis  Past surgical hx, Medications, Allergies, Family hx, Social hx all reviewed.   Physical Exam: Blood pressure 110/64, pulse 73, height 5\' 7"  (1.702 m), weight 225 lb (102.059 kg), SpO2 96 %. Body mass index is 35.23 kg/(m^2).  General - No distress ENT - No sinus tenderness, no oral exudate, no LAN Cardiac - s1s2 regular, no murmur Chest - No wheeze/rales/dullness Back - No focal tenderness Abd - Soft, non-tender Ext - No edema Neuro - Normal strength Skin - No rashes Psych - normal mood, and behavior   Assessment/Plan:  Chronic cough >> related to asthma, post-nasal drip, GERD, and vocal cord dysfunction. Plan: - advised him to avoid throat clearing - he  should use sugarless candy to keep mouth moist - he should sip water when he has urge to cough - he can use salt water gargles ad lib prn  Asthma. Plan: - will add Qvar - continue singulair - prn albuterol  Exercise induced asthma. Plan: - montelukast, Qvar - discussed doing brief, high intensity warm up 15 minutes prior to exercise >> can also use albuterol prior to exercise  Upper airway cough syndrome with post-nasal drip and allergic rhinitis. Plan: - discussed environmental controls to limit possible allergen exposure - prn nasal irrigation, flonase, allegra - continue singulair  GERD. Plan: - he is to continue omprezale  Vocal cord dysfunction. Plan: - monitor clinically  Snoring. HST from 02/2013 showed borderline apnea.  He does not feel his sleep is much of an issue at present. Plan: - monitor clinically - if his sleep gets worse, then he would need repeat sleep study >> this would need to be done in sleep lab   [name], MD [place] Pager:  [phone]

## 2014-12-03 ENCOUNTER — Telehealth: Payer: Self-pay | Admitting: *Deleted

## 2014-12-03 MED ORDER — FLUTICASONE PROPIONATE HFA 110 MCG/ACT IN AERO: 2.0000 | INHALATION_SPRAY | Freq: Two times a day (BID) | RESPIRATORY_TRACT | Status: DC

## 2014-12-03 NOTE — Telephone Encounter (Signed)
Okay to change to flovent HFA 110 mcg two puffs bid.  Dispense 1 inhaler with 5 refills.  He should rinse mouth after each use.

## 2014-12-03 NOTE — Telephone Encounter (Signed)
rx sent to pharmacy.  Patient notified. Nothing further needed.

## 2014-12-03 NOTE — Telephone Encounter (Signed)
Received PA request for Qvar 80 mcg. According to [employer] rep patient must 1st try and fail Flovent Diskus and or Flovent HFA.

## 2015-01-14 DIAGNOSIS — R310 Gross hematuria: Secondary | ICD-10-CM | POA: Diagnosis not present

## 2015-01-20 DIAGNOSIS — N400 Enlarged prostate without lower urinary tract symptoms: Secondary | ICD-10-CM | POA: Diagnosis not present

## 2015-01-20 DIAGNOSIS — R310 Gross hematuria: Secondary | ICD-10-CM | POA: Diagnosis not present

## 2015-01-20 DIAGNOSIS — N200 Calculus of kidney: Secondary | ICD-10-CM | POA: Diagnosis not present

## 2015-01-20 DIAGNOSIS — N3289 Other specified disorders of bladder: Secondary | ICD-10-CM | POA: Diagnosis not present

## 2015-01-27 DIAGNOSIS — N138 Other obstructive and reflux uropathy: Secondary | ICD-10-CM | POA: Diagnosis not present

## 2015-01-27 DIAGNOSIS — N359 Urethral stricture, unspecified: Secondary | ICD-10-CM | POA: Diagnosis not present

## 2015-01-27 DIAGNOSIS — N401 Enlarged prostate with lower urinary tract symptoms: Secondary | ICD-10-CM | POA: Diagnosis not present

## 2015-02-16 DIAGNOSIS — B353 Tinea pedis: Secondary | ICD-10-CM | POA: Diagnosis not present

## 2015-02-16 DIAGNOSIS — B351 Tinea unguium: Secondary | ICD-10-CM | POA: Diagnosis not present

## 2015-03-06 ENCOUNTER — Ambulatory Visit (INDEPENDENT_AMBULATORY_CARE_PROVIDER_SITE_OTHER): Payer: Medicare Other

## 2015-03-06 DIAGNOSIS — Z23 Encounter for immunization: Secondary | ICD-10-CM | POA: Diagnosis not present

## 2015-03-24 DIAGNOSIS — B351 Tinea unguium: Secondary | ICD-10-CM | POA: Diagnosis not present

## 2015-03-24 DIAGNOSIS — B353 Tinea pedis: Secondary | ICD-10-CM | POA: Diagnosis not present

## 2015-04-20 DIAGNOSIS — B353 Tinea pedis: Secondary | ICD-10-CM | POA: Diagnosis not present

## 2015-04-20 DIAGNOSIS — S82392A Other fracture of lower end of left tibia, initial encounter for closed fracture: Secondary | ICD-10-CM | POA: Diagnosis not present

## 2015-05-11 DIAGNOSIS — S82391D Other fracture of lower end of right tibia, subsequent encounter for closed fracture with routine healing: Secondary | ICD-10-CM | POA: Diagnosis not present

## 2015-05-25 DIAGNOSIS — S82392D Other fracture of lower end of left tibia, subsequent encounter for closed fracture with routine healing: Secondary | ICD-10-CM | POA: Diagnosis not present

## 2015-05-25 DIAGNOSIS — M2141 Flat foot [pes planus] (acquired), right foot: Secondary | ICD-10-CM | POA: Diagnosis not present

## 2015-05-28 ENCOUNTER — Encounter: Payer: Self-pay | Admitting: Pulmonary Disease

## 2015-05-28 ENCOUNTER — Ambulatory Visit (INDEPENDENT_AMBULATORY_CARE_PROVIDER_SITE_OTHER): Payer: Medicare Other | Admitting: Pulmonary Disease

## 2015-05-28 VITALS — BP 132/78 | HR 85 | Ht 67.0 in | Wt 238.0 lb

## 2015-05-28 DIAGNOSIS — J45998 Other asthma: Secondary | ICD-10-CM | POA: Diagnosis not present

## 2015-05-28 DIAGNOSIS — IMO0002 Reserved for concepts with insufficient information to code with codable children: Secondary | ICD-10-CM

## 2015-05-28 MED ORDER — FLUTICASONE PROPIONATE HFA 110 MCG/ACT IN AERO: 2.0000 | INHALATION_SPRAY | Freq: Two times a day (BID) | RESPIRATORY_TRACT | Status: DC

## 2015-05-28 NOTE — Patient Instructions (Signed)
Flovent two puffs twice per day Montelukast 10 mg pill nightly  Call if not feeling better  Follow up in 6 months

## 2015-05-28 NOTE — Progress Notes (Signed)
Current Outpatient Prescriptions on File Prior to Visit  Medication Sig  . albuterol (PROAIR HFA) 108 (90 BASE) MCG/ACT inhaler Inhale 2 puffs into the lungs every 6 (six) hours as needed for wheezing or shortness of breath.  Marland Kitchen aspirin EC 81 MG tablet Take 81 mg by mouth every morning.  . Aspirin-Acetaminophen-Caffeine (EXCEDRIN PO) Take 2 tablets by mouth 2 (two) times daily as needed (migraine).  Marland Kitchen atenolol (TENORMIN) 25 MG tablet Take 25 mg by mouth every morning.   Marland Kitchen atorvastatin (LIPITOR) 80 MG tablet Take 40 mg by mouth at bedtime.  . cyclobenzaprine (FLEXERIL) 10 MG tablet Take 0.5-1 tablets (5-10 mg total) by mouth 3 (three) times daily as needed for muscle spasms.  . diazepam (VALIUM) 5 MG tablet Take 1 tablet (5 mg total) by mouth every 8 (eight) hours as needed (back pain).  Marland Kitchen diclofenac (VOLTAREN) 75 MG EC tablet Take 75 mg by mouth 2 (two) times daily.   . fluticasone (FLONASE) 50 MCG/ACT nasal spray Place 2 sprays into both nostrils daily. (Patient taking differently: Place 2 sprays into both nostrils daily as needed. )  . Ketotifen Fumarate (THERA TEARS ALLERGY OP) Place 1 drop into both eyes daily as needed (dry eyes).  . montelukast (SINGULAIR) 10 MG tablet Take 1 tablet (10 mg total) by mouth at bedtime.  . Omega-3 Fatty Acids (FISH OIL) 1000 MG CPDR Take 2,000 mg by mouth 2 (two) times daily.    Marland Kitchen omeprazole (PRILOSEC) 20 MG capsule Take 20 mg by mouth daily.   Marland Kitchen topiramate (TOPAMAX) 25 MG tablet Take 25 mg by mouth daily.    Current Facility-Administered Medications on File Prior to Visit  Medication  . albuterol (PROVENTIL) (2.5 MG/3ML) 0.083% nebulizer solution 2.5 mg    Chief Complaint  Patient presents with  . Follow-up    Pt states that breathing is worsening-increased SOB and wheezing. Having to use Albuterol nebs more frequent. Using QVAR as directed. Recent Asthma attack x 1 week agoi    TESTS: HST 03/07/13 >> AHI 3.9, SaO2 low 80% PFT 02/19/09 >> FEV1 2.90  (90%), FEV1% 70, TLC 5.62(93%), DLCO 99%, +BD PFT 07/21/14 >> FEV1 2.70 (93%), FEV1% 74, TLC 5.97 (91%), RV 2.65 (126%), DLCO 106%, +BD RAST 07/21/14 >> react to dust mites, grasses; IgE 49  Past medical hx >> HLD, HTN, GERD, BPH, HA, Osteoarthritis  Past surgical hx, Medications, Allergies, Family hx, Social hx all reviewed.   Vital Signs BP 132/78 mmHg  Pulse 85  Ht 5\' 7"  (1.702 m)  Wt 238 lb (107.956 kg)  BMI 37.27 kg/m2  SpO2 96%  History of Present Illness: Douglas Carpenter is a 59 y.o. male former smoker with chronic cough in setting of Asthma, Post nasal drip with allergies, GERD, and vocal cord disease.  He also has hx of snoring.  He has noticed more cough, wheeze, and chest congestion.  This has interrupt his relations with his wife on at least two occasions.  He gets better with albuterol tx.  He is not using ICS at present.  He still uses montelukast.    He denies sinus congestion, post nasal drip, sore throat, or skin rash.  Physical Exam:  General - No distress ENT - No sinus tenderness, no oral exudate, no LAN Cardiac - s1s2 regular, no murmur Chest - No wheeze/rales/dullness Back - No focal tenderness Abd - Soft, non-tender Ext - No edema Neuro - Normal strength Skin - No rashes Psych - normal mood, and behavior  Discussion: He has chronic cough related to allergic asthma, post-nasal drip, GERD, and VCD.  His current symptoms are likely related to flare of asthma from environmental exposures (pollution, dust mites, dog dander).  Assessment/Plan:  Persistent allergic asthma. Plan: - will refill flovent >> he gets scripts from [place] - continue singulair - prn albuterol  Exercise induced asthma. Plan: - montelukast, flovent - warm up before exercising  Upper airway cough syndrome with post-nasal drip and allergic rhinitis. Plan: - discussed environmental controls to limit possible allergen exposure >> might need allergy referral - prn nasal irrigation,  flonase, allegra - continue singulair  GERD. Plan: - he is to continue omprezale  Vocal cord dysfunction. Plan: - monitor clinically  Snoring. HST from 02/2013 showed borderline apnea.  He does not feel his sleep is much of an issue at present. Plan: - monitor clinically - if his sleep gets worse, then he would need repeat sleep study >> this would need to be done in sleep lab   Patient Instructions  Flovent two puffs twice per day Montelukast 10 mg pill nightly  Call if not feeling better  Follow up in 6 months    [name], MD [place] Pager:  [phone]

## 2015-06-09 DIAGNOSIS — M2141 Flat foot [pes planus] (acquired), right foot: Secondary | ICD-10-CM | POA: Diagnosis not present

## 2015-06-09 DIAGNOSIS — S82392D Other fracture of lower end of left tibia, subsequent encounter for closed fracture with routine healing: Secondary | ICD-10-CM | POA: Diagnosis not present

## 2015-08-20 ENCOUNTER — Ambulatory Visit (INDEPENDENT_AMBULATORY_CARE_PROVIDER_SITE_OTHER): Payer: Medicare Other | Admitting: Adult Health

## 2015-08-20 ENCOUNTER — Encounter: Payer: Self-pay | Admitting: Adult Health

## 2015-08-20 VITALS — BP 124/78 | HR 82 | Temp 98.4°F | Ht 67.0 in | Wt 234.0 lb

## 2015-08-20 DIAGNOSIS — J4531 Mild persistent asthma with (acute) exacerbation: Secondary | ICD-10-CM | POA: Diagnosis not present

## 2015-08-20 DIAGNOSIS — R0602 Shortness of breath: Secondary | ICD-10-CM | POA: Diagnosis not present

## 2015-08-20 MED ORDER — FLUTICASONE PROPIONATE HFA 110 MCG/ACT IN AERO: 2.0000 | INHALATION_SPRAY | Freq: Two times a day (BID) | RESPIRATORY_TRACT | Status: DC

## 2015-08-20 MED ORDER — PREDNISONE 10 MG PO TABS: ORAL_TABLET | ORAL | Status: DC

## 2015-08-20 MED ORDER — MONTELUKAST SODIUM 10 MG PO TABS: 10.0000 mg | ORAL_TABLET | Freq: Every day | ORAL | Status: DC

## 2015-08-20 MED ORDER — ALBUTEROL SULFATE HFA 108 (90 BASE) MCG/ACT IN AERS: 2.0000 | INHALATION_SPRAY | Freq: Four times a day (QID) | RESPIRATORY_TRACT | Status: DC | PRN

## 2015-08-20 MED ORDER — AZITHROMYCIN 250 MG PO TABS: ORAL_TABLET | ORAL | Status: AC

## 2015-08-20 MED ORDER — LEVALBUTEROL HCL 0.63 MG/3ML IN NEBU
0.6300 mg | INHALATION_SOLUTION | Freq: Once | RESPIRATORY_TRACT | Status: AC
  Administered 2015-08-20: 0.63 mg via RESPIRATORY_TRACT

## 2015-08-20 NOTE — Progress Notes (Signed)
Reviewed and agree with assessment/plan. 

## 2015-08-20 NOTE — Addendum Note (Signed)
Addended by: Osa Craver on: 08/20/2015 09:36 AM   Modules accepted: Orders

## 2015-08-20 NOTE — Progress Notes (Signed)
Subjective:    Patient ID: Douglas Carpenter, male    DOB: May 13, 1956, 60 y.o.   MRN: [id number]  HPI   History of Present Illness: Douglas Carpenter is a 60 y.o. male former smoker with chronic cough in setting of Asthma, Post nasal drip with allergies, GERD, and vocal cord disease.  He also has hx of snoring.   08/20/2015 Acute OV : Asthma  Pt presents for an acute office visit.  Complains of wheezing, chest congestion/tightness, prod cough with yellow mucus, sinus pressure/drainage,  low grade fever 99.0, and SOB starting 08/16/15.  No body aches. Denies nausea or vomiting.  Taking tylenol , cold meds without relief. Took wife's  albuterol neb.  Has sinus congestion and stuffiness.  Remains on Flovent .  Denies chest pain, orthopnea, edema, hemoptysis , n/v/d, recent travel or abx use.        TESTS: HST 03/07/13 >> AHI 3.9, SaO2 low 80% PFT 02/19/09 >> FEV1 2.90 (90%), FEV1% 70, TLC 5.62(93%), DLCO 99%, +BD PFT 07/21/14 >> FEV1 2.70 (93%), FEV1% 74, TLC 5.97 (91%), RV 2.65 (126%), DLCO 106%, +BD CXR 06/09/14 normal  RAST 07/21/14 >> react to dust mites, grasses; IgE 49   Past Medical History  Diagnosis Date  . Chronic rhinitis 01/27/2009  . HYPERLIPIDEMIA 01/27/2009  . HYPERTENSION 01/27/2009  . ED (erectile dysfunction)   . Testosterone deficiency   . GERD (gastroesophageal reflux disease)   . Urethral stricture   . Vocal cord anomaly     pt states told from [place]  doctor heard a little gurgle sound -- no farther testing done  . Mild obstructive sleep apnea     per pt -- mild osa test result--  no rx cpap needed  . BPH (benign prostatic hypertrophy)   . Mild asthma     seasonal allergies, uses Proair once per yr.   . Headache(784.0)     migraines, as many as 3 in a week    . Arthritis     "everywhere"   . Current Outpatient Prescriptions on File Prior to Visit  Medication Sig Dispense Refill  . albuterol (PROAIR HFA) 108 (90 BASE) MCG/ACT inhaler Inhale 2 puffs into the lungs  every 6 (six) hours as needed for wheezing or shortness of breath. 1 Inhaler 11  . aspirin EC 81 MG tablet Take 81 mg by mouth every morning.    . Aspirin-Acetaminophen-Caffeine (EXCEDRIN PO) Take 2 tablets by mouth 2 (two) times daily as needed (migraine).    Marland Kitchen atenolol (TENORMIN) 25 MG tablet Take 25 mg by mouth every morning.     Marland Kitchen atorvastatin (LIPITOR) 80 MG tablet Take 40 mg by mouth at bedtime.    . cyclobenzaprine (FLEXERIL) 10 MG tablet Take 0.5-1 tablets (5-10 mg total) by mouth 3 (three) times daily as needed for muscle spasms. 50 tablet 1  . diazepam (VALIUM) 5 MG tablet Take 1 tablet (5 mg total) by mouth every 8 (eight) hours as needed (back pain). 15 tablet 0  . diclofenac (VOLTAREN) 75 MG EC tablet Take 75 mg by mouth 2 (two) times daily.     . fluticasone (FLONASE) 50 MCG/ACT nasal spray Place 2 sprays into both nostrils daily. (Patient taking differently: Place 2 sprays into both nostrils daily as needed. ) 16 g 2  . fluticasone (FLOVENT HFA) 110 MCG/ACT inhaler Inhale 2 puffs into the lungs 2 (two) times daily. 1 Inhaler 5  . Ketotifen Fumarate (THERA TEARS ALLERGY OP) Place 1 drop into  both eyes daily as needed (dry eyes).    . montelukast (SINGULAIR) 10 MG tablet Take 1 tablet (10 mg total) by mouth at bedtime. 30 tablet 5  . Omega-3 Fatty Acids (FISH OIL) 1000 MG CPDR Take 2,000 mg by mouth 2 (two) times daily.      Marland Kitchen omeprazole (PRILOSEC) 20 MG capsule Take 20 mg by mouth daily.     Marland Kitchen topiramate (TOPAMAX) 25 MG tablet Take 25 mg by mouth daily.      Current Facility-Administered Medications on File Prior to Visit  Medication Dose Route Frequency Provider Last Rate Last Dose  . albuterol (PROVENTIL) (2.5 MG/3ML) 0.083% nebulizer solution 2.5 mg  2.5 mg Nebulization Once Darlyne Russian, MD         Review of Systems  Constitutional:   No  weight loss, night sweats,  Fevers, chills, fatigue, or  lassitude.  HEENT:   No headaches,  Difficulty swallowing,  Tooth/dental  problems, or  Sore throat,                No sneezing, itching, ear ache,  +nasal congestion, post nasal drip,   CV:  No chest pain,  Orthopnea, PND, swelling in lower extremities, anasarca, dizziness, palpitations, syncope.   GI  No heartburn, indigestion, abdominal pain, nausea, vomiting, diarrhea, change in bowel habits, loss of appetite, bloody stools.   Resp:  No chest wall deformity  Skin: no rash or lesions.  GU: no dysuria, change in color of urine, no urgency or frequency.  No flank pain, no hematuria   MS:  No joint pain or swelling.  No decreased range of motion.  No back pain.  Psych:  No change in mood or affect. No depression or anxiety.  No memory loss.         Objective:   Physical Exam  Filed Vitals:   08/20/15 0907  BP: 124/78  Pulse: 82  Temp: 98.4 F (36.9 C)  TempSrc: Oral  Height: 5\' 7"  (1.702 m)  Weight: 234 lb (106.142 kg)  SpO2: 96%    GEN: A/Ox3; pleasant , NAD, well nourished   HEENT:  [place]/AT,  EACs-clear, TMs-wnl, NOSE-clear, THROAT-clear, no lesions, no postnasal drip or exudate noted.   NECK:  Supple w/ fair ROM; no JVD; normal carotid impulses w/o bruits; no thyromegaly or nodules palpated; no lymphadenopathy.  RESP  Few exp wheezing , speaks in full sentences no accessory muscle use, no dullness to percussion  CARD:  RRR, no m/r/g  , no peripheral edema, pulses intact, no cyanosis or clubbing.  GI:   Soft & nt; nml bowel sounds; no organomegaly or masses detected.  Musco: Warm bil, no deformities or joint swelling noted.   Neuro: alert, no focal deficits noted.    Skin: Warm, no lesions or rashes         Assessment & Plan:

## 2015-08-20 NOTE — Assessment & Plan Note (Addendum)
Exacerbation with URI  xopenex neb  X 1 .   Plan  Z-Pack take as directed.  Mucinex DM Twice daily  As needed  Cough/congestion  Prednisone taper over next week.  Saline nasal rinses As needed   Fluids and rest  follow up Dr. Halford Chessman  In 3 months and As needed   Please contact office for sooner follow up if symptoms do not improve or worsen or seek emergency care

## 2015-08-20 NOTE — Patient Instructions (Signed)
Z-Pack take as directed.  Mucinex DM Twice daily  As needed  Cough/congestion  Prednisone taper over next week.  Saline nasal rinses As needed   Fluids and rest  follow up Dr. Halford Chessman  In 3 months and As needed   Please contact office for sooner follow up if symptoms do not improve or worsen or seek emergency care

## 2015-09-09 ENCOUNTER — Telehealth: Payer: Self-pay | Admitting: Pulmonary Disease

## 2015-09-09 NOTE — Telephone Encounter (Signed)
Scheduled him for visit with me on Monday, March 27 at 9 am.

## 2015-09-09 NOTE — Telephone Encounter (Signed)
Spoke with pt's wife, states pt's cough is not improving- cough is prod with white mucus.  Pt also notes PND.  Pt coughing throughout day and night, cough wakes him up while sleeping.  Pt requesting further recs.  Denies chest pain, fever, chills.    Pt uses [facility] on [address].    VS please advise.  Thanks!

## 2015-09-09 NOTE — Telephone Encounter (Signed)
Called spoke with toni. She was fine with appt on 3/27 at 9 AM. Will need to have Chantel open this up tomorrow. Will hold in triage

## 2015-09-10 NOTE — Telephone Encounter (Signed)
appt has been scheduled for 3/27 at 9 am. Will forward to Dr. Halford Chessman so he is aware.

## 2015-09-21 ENCOUNTER — Ambulatory Visit: Payer: Medicare Other | Admitting: Pulmonary Disease

## 2015-10-27 ENCOUNTER — Encounter: Payer: Self-pay | Admitting: Pulmonary Disease

## 2015-10-27 ENCOUNTER — Ambulatory Visit (INDEPENDENT_AMBULATORY_CARE_PROVIDER_SITE_OTHER): Payer: Medicare Other | Admitting: Pulmonary Disease

## 2015-10-27 VITALS — BP 120/64 | HR 78 | Ht 67.0 in | Wt 234.8 lb

## 2015-10-27 DIAGNOSIS — J45998 Other asthma: Secondary | ICD-10-CM

## 2015-10-27 DIAGNOSIS — IMO0002 Reserved for concepts with insufficient information to code with codable children: Secondary | ICD-10-CM

## 2015-10-27 DIAGNOSIS — R0683 Snoring: Secondary | ICD-10-CM

## 2015-10-27 LAB — NITRIC OXIDE: Nitric Oxide: 228

## 2015-10-27 MED ORDER — FLUTICASONE-SALMETEROL 230-21 MCG/ACT IN AERO: 2.0000 | INHALATION_SPRAY | Freq: Two times a day (BID) | RESPIRATORY_TRACT | Status: DC

## 2015-10-27 MED ORDER — PREDNISONE 10 MG PO TABS: ORAL_TABLET | ORAL | Status: DC

## 2015-10-27 NOTE — Progress Notes (Signed)
Current Outpatient Prescriptions on File Prior to Visit  Medication Sig  . albuterol (PROAIR HFA) 108 (90 Base) MCG/ACT inhaler Inhale 2 puffs into the lungs every 6 (six) hours as needed for wheezing or shortness of breath.  Marland Kitchen aspirin EC 81 MG tablet Take 81 mg by mouth every morning.  . Aspirin-Acetaminophen-Caffeine (EXCEDRIN PO) Take 2 tablets by mouth 2 (two) times daily as needed (migraine).  Marland Kitchen atenolol (TENORMIN) 25 MG tablet Take 25 mg by mouth every morning.   Marland Kitchen atorvastatin (LIPITOR) 80 MG tablet Take 40 mg by mouth at bedtime.  . cyclobenzaprine (FLEXERIL) 10 MG tablet Take 0.5-1 tablets (5-10 mg total) by mouth 3 (three) times daily as needed for muscle spasms.  . diazepam (VALIUM) 5 MG tablet Take 1 tablet (5 mg total) by mouth every 8 (eight) hours as needed (back pain).  Marland Kitchen diclofenac (VOLTAREN) 75 MG EC tablet Take 75 mg by mouth 2 (two) times daily.   . fluticasone (FLONASE) 50 MCG/ACT nasal spray Place 2 sprays into both nostrils daily. (Patient taking differently: Place 2 sprays into both nostrils daily as needed. )  . Ketotifen Fumarate (THERA TEARS ALLERGY OP) Place 1 drop into both eyes daily as needed (dry eyes).  . montelukast (SINGULAIR) 10 MG tablet Take 1 tablet (10 mg total) by mouth at bedtime.  . Omega-3 Fatty Acids (FISH OIL) 1000 MG CPDR Take 2,000 mg by mouth 2 (two) times daily.    Marland Kitchen omeprazole (PRILOSEC) 20 MG capsule Take 20 mg by mouth daily.   Marland Kitchen topiramate (TOPAMAX) 25 MG tablet Take 25 mg by mouth daily.    Current Facility-Administered Medications on File Prior to Visit  Medication  . albuterol (PROVENTIL) (2.5 MG/3ML) 0.083% nebulizer solution 2.5 mg    Chief Complaint  Patient presents with  . Follow-up    sob-same,denies cough.Doing good overall    Tests HST 03/07/13 >> AHI 3.9, SaO2 low 80% PFT 02/19/09 >> FEV1 2.90 (90%), FEV1% 70, TLC 5.62(93%), DLCO 99%, +BD PFT 07/21/14 >> FEV1 2.70 (93%), FEV1% 74, TLC 5.97 (91%), RV 2.65 (126%), DLCO 106%,  +BD RAST 07/21/14 >> react to dust mites, grasses; IgE 49 Nitrous oxide 10/27/15 >> 228  Past medical history HLD, HTN, GERD, BPH, HA, Osteoarthritis  Past surgical hx, Allergies, Family hx, Social hx all reviewed.  Vital Signs BP 120/64 mmHg  Pulse 78  Ht 5\' 7"  (1.702 m)  Wt 234 lb 12.8 oz (106.505 kg)  BMI 36.77 kg/m2  SpO2 96%  History of Present Illness: Douglas Carpenter is a 60 y.o. male former smoker with chronic cough in setting of Asthma, Post nasal drip with allergies, GERD, and vocal cord disease.  He also has hx of snoring.  He thinks he had a cold in March.  His breathing was terrible then.  He has improved.  He still gets wheezing episodes, especially when outside.  He doesn't feel like he can get full air in.  He is not having chest pain.  His sinuses are okay.  He is not having problems with reflux at present.  He is using his albuterol several times per day.  He denies skin rash or leg swelling.  His snoring seems to be getting worse, and his wife is concerned he is having more trouble with his breathing at night.  Physical Exam:  General - No distress ENT - No sinus tenderness, no oral exudate, no LAN, MP 4, scalloped tongue Cardiac - s1s2 regular, no murmur Chest - No  wheeze/rales/dullness Back - No focal tenderness Abd - Soft, non-tender Ext - No edema Neuro - Normal strength Skin - No rashes Psych - normal mood, and behavior   Discussion: He has chronic cough related to allergic asthma, post-nasal drip, GERD, and VCD.  Assessment/Plan:  Persistent allergic asthma uncontrolled Plan: - will give him course of prednisone - change to advair HFA in place of flovent - continue singulair - prn albuterol - repeat nitric oxide testing at next visit - if not improved, then might need to repeat PFT and consider biologic's for allergic asthma or referral to allergist  Exercise induced asthma. Plan: - montelukast, advair - warm up before exercising  Upper  airway cough syndrome with post-nasal drip and allergic rhinitis. Plan: - discussed environmental controls to limit possible allergen exposure >> might need allergy referral - prn nasal irrigation, flonase, allegra - continue singulair  GERD. Plan: - he is to continue omprezale  Vocal cord dysfunction. Plan: - monitor clinically  Snoring. HST from 02/2013 showed borderline apnea.  His wife is concerned this has been getting worse Plan: - re-assess after optimizing therapy for asthma >> if persistent issue, then will need repeat home sleep study   Patient Instructions  Stop flovent  Advair 2 puffs twice per day >> rinse mouth after each use  Prednisone 10 mg pill as directed  Follow up in 4 weeks with Dr. Halford Chessman or Nurse practitioner    Douglas Mires, MD [facility] Pager:  [phone] 10/27/2015, 10:56 AM

## 2015-10-27 NOTE — Patient Instructions (Addendum)
Stop flovent  Advair 2 puffs twice per day >> rinse mouth after each use  Prednisone 10 mg pill as directed  Follow up in 4 weeks with Dr. Halford Chessman or Nurse practitioner

## 2015-10-27 NOTE — Addendum Note (Signed)
Addended by: Virl Cagey on: 10/27/2015 11:55 AM   Modules accepted: Orders

## 2015-10-30 ENCOUNTER — Telehealth: Payer: Self-pay | Admitting: Pulmonary Disease

## 2015-10-30 NOTE — Telephone Encounter (Signed)
LVM for pt to return call

## 2015-11-02 NOTE — Telephone Encounter (Signed)
Left message for patient's wife to call back.  

## 2015-11-03 NOTE — Telephone Encounter (Signed)
lmtcb x3 for pt's wife 

## 2015-11-05 NOTE — Telephone Encounter (Signed)
lmomtcb advising to please call back and leave new msg if anything further needed regarding msg. Per triage protocol, will sign off.

## 2015-11-06 ENCOUNTER — Ambulatory Visit: Payer: Medicare Other | Admitting: Adult Health

## 2015-12-07 ENCOUNTER — Encounter: Payer: Self-pay | Admitting: Adult Health

## 2015-12-07 ENCOUNTER — Ambulatory Visit (INDEPENDENT_AMBULATORY_CARE_PROVIDER_SITE_OTHER): Payer: Medicare Other | Admitting: Adult Health

## 2015-12-07 VITALS — BP 126/78 | HR 76 | Temp 97.6°F | Ht 67.0 in | Wt 231.0 lb

## 2015-12-07 DIAGNOSIS — J45909 Unspecified asthma, uncomplicated: Secondary | ICD-10-CM | POA: Diagnosis not present

## 2015-12-07 DIAGNOSIS — J310 Chronic rhinitis: Secondary | ICD-10-CM

## 2015-12-07 LAB — NITRIC OXIDE: Nitric Oxide: 213

## 2015-12-07 NOTE — Patient Instructions (Addendum)
Continue on Advair 2 puffs Twice daily  , rinse after use.  Add Allegra 180mg  daily As needed  Drainage .  Follow up with Dr. Halford Chessman  In 3 months and .pnr  Please contact office for sooner follow up if symptoms do not improve or worsen or seek emergency care

## 2015-12-10 NOTE — Assessment & Plan Note (Signed)
Add Allegra 180mg  daily As needed  Drainage .  Follow up with Dr. Halford Chessman  In 3 months and .pnr  Please contact office for sooner follow up if symptoms do not improve or worsen or seek emergency care

## 2015-12-10 NOTE — Assessment & Plan Note (Signed)
Improved control on Advair  Plan  Continue on Advair 2 puffs Twice daily  , rinse after use.  Add Allegra 180mg  daily As needed  Drainage .  Follow up with Dr. Halford Chessman  In 3 months  Please contact office for sooner follow up if symptoms do not improve or worsen or seek emergency care

## 2015-12-10 NOTE — Progress Notes (Signed)
Subjective:    Patient ID: Douglas Carpenter, male    DOB: 17-Aug-1955, 60 y.o.   MRN: [id number]  HPI  History of Present Illness: Douglas Carpenter is a 60 y.o. male former smoker with chronic cough in setting of Asthma, Post nasal drip with allergies, GERD, and vocal cord disease.  He also has hx of snoring.   12/10/2015 Follow up : Asthma  Pt presents for a follow up for asthma .  Last ov with asthma flare , given prednisone taper . Changed from Flovent to Advair.  Says he is feeling better , feels his breathing has improved with decreased wheezing and dyspena.  Denies chest pain, orthopnea, edema, hemoptysis , n/v/d, recent travel or abx use.  Does have post nasal drip and drainage.     TESTS: HST 03/07/13 >> AHI 3.9, SaO2 low 80% PFT 02/19/09 >> FEV1 2.90 (90%), FEV1% 70, TLC 5.62(93%), DLCO 99%, +BD PFT 07/21/14 >> FEV1 2.70 (93%), FEV1% 74, TLC 5.97 (91%), RV 2.65 (126%), DLCO 106%, +BD CXR 06/09/14 normal  RAST 07/21/14 >> react to dust mites, grasses; IgE 49   Past Medical History  Diagnosis Date  . Chronic rhinitis 01/27/2009  . HYPERLIPIDEMIA 01/27/2009  . HYPERTENSION 01/27/2009  . ED (erectile dysfunction)   . Testosterone deficiency   . GERD (gastroesophageal reflux disease)   . Urethral stricture   . Vocal cord anomaly     pt states told from [place]  doctor heard a little gurgle sound -- no farther testing done  . Mild obstructive sleep apnea     per pt -- mild osa test result--  no rx cpap needed  . BPH (benign prostatic hypertrophy)   . Mild asthma     seasonal allergies, uses Proair once per yr.   . Headache(784.0)     migraines, as many as 3 in a week    . Arthritis     "everywhere"   . Current Outpatient Prescriptions on File Prior to Visit  Medication Sig Dispense Refill  . albuterol (PROAIR HFA) 108 (90 Base) MCG/ACT inhaler Inhale 2 puffs into the lungs every 6 (six) hours as needed for wheezing or shortness of breath. 1 Inhaler 5  . aspirin EC 81 MG tablet Take  81 mg by mouth every morning.    . Aspirin-Acetaminophen-Caffeine (EXCEDRIN PO) Take 2 tablets by mouth 2 (two) times daily as needed (migraine).    Marland Kitchen atenolol (TENORMIN) 25 MG tablet Take 25 mg by mouth every morning.     Marland Kitchen atorvastatin (LIPITOR) 80 MG tablet Take 40 mg by mouth at bedtime.    . cyclobenzaprine (FLEXERIL) 10 MG tablet Take 0.5-1 tablets (5-10 mg total) by mouth 3 (three) times daily as needed for muscle spasms. 50 tablet 1  . diazepam (VALIUM) 5 MG tablet Take 1 tablet (5 mg total) by mouth every 8 (eight) hours as needed (back pain). 15 tablet 0  . diclofenac (VOLTAREN) 75 MG EC tablet Take 75 mg by mouth 2 (two) times daily.     . fluticasone-salmeterol (ADVAIR HFA) 230-21 MCG/ACT inhaler Inhale 2 puffs into the lungs 2 (two) times daily. 1 Inhaler 12  . Ketotifen Fumarate (THERA TEARS ALLERGY OP) Place 1 drop into both eyes daily as needed (dry eyes).    . montelukast (SINGULAIR) 10 MG tablet Take 1 tablet (10 mg total) by mouth at bedtime. 30 tablet 5  . Omega-3 Fatty Acids (FISH OIL) 1000 MG CPDR Take 2,000 mg by mouth 2 (two)  times daily.      Marland Kitchen omeprazole (PRILOSEC) 20 MG capsule Take 20 mg by mouth daily.     Marland Kitchen topiramate (TOPAMAX) 25 MG tablet Take 25 mg by mouth daily.     . fluticasone (FLONASE) 50 MCG/ACT nasal spray Place 2 sprays into both nostrils daily. (Patient not taking: Reported on 12/07/2015) 16 g 2   Current Facility-Administered Medications on File Prior to Visit  Medication Dose Route Frequency Provider Last Rate Last Dose  . albuterol (PROVENTIL) (2.5 MG/3ML) 0.083% nebulizer solution 2.5 mg  2.5 mg Nebulization Once Darlyne Russian, MD         Review of Systems Constitutional:   No  weight loss, night sweats,  Fevers, chills, fatigue, or  lassitude.  HEENT:   No headaches,  Difficulty swallowing,  Tooth/dental problems, or  Sore throat,                No sneezing, itching, ear ache,  +nasal congestion, post nasal drip,   CV:  No chest pain,   Orthopnea, PND, swelling in lower extremities, anasarca, dizziness, palpitations, syncope.   GI  No heartburn, indigestion, abdominal pain, nausea, vomiting, diarrhea, change in bowel habits, loss of appetite, bloody stools.   Resp:  No chest wall deformity  Skin: no rash or lesions.  GU: no dysuria, change in color of urine, no urgency or frequency.  No flank pain, no hematuria   MS:  No joint pain or swelling.  No decreased range of motion.  No back pain.  Psych:  No change in mood or affect. No depression or anxiety.  No memory loss.         Objective:   Physical Exam Filed Vitals:   12/07/15 1011  BP: 126/78  Pulse: 76  Temp: 97.6 F (36.4 C)  TempSrc: Oral  Height: 5\' 7"  (1.702 m)  Weight: 231 lb (104.781 kg)  SpO2: 96%    GEN: A/Ox3; pleasant , NAD, well nourished   HEENT:  [place]/AT,  EACs-clear, TMs-wnl, NOSE-clear, THROAT-clear, no lesions, no postnasal drip or exudate noted.   NECK:  Supple w/ fair ROM; no JVD; normal carotid impulses w/o bruits; no thyromegaly or nodules palpated; no lymphadenopathy.  RESP  CTA w/ no wheezing , no accessory muscle use, no dullness to percussion  CARD:  RRR, no m/r/g  , no peripheral edema, pulses intact, no cyanosis or clubbing.  GI:   Soft & nt; nml bowel sounds; no organomegaly or masses detected.  Musco: Warm bil, no deformities or joint swelling noted.   Neuro: alert, no focal deficits noted.    Skin: Warm, no lesions or rashes

## 2016-03-09 ENCOUNTER — Ambulatory Visit (INDEPENDENT_AMBULATORY_CARE_PROVIDER_SITE_OTHER): Payer: Medicare Other | Admitting: Pulmonary Disease

## 2016-03-09 ENCOUNTER — Encounter: Payer: Self-pay | Admitting: Pulmonary Disease

## 2016-03-09 VITALS — BP 124/70 | HR 75 | Ht 67.0 in | Wt 231.0 lb

## 2016-03-09 DIAGNOSIS — J45998 Other asthma: Secondary | ICD-10-CM

## 2016-03-09 DIAGNOSIS — K219 Gastro-esophageal reflux disease without esophagitis: Secondary | ICD-10-CM

## 2016-03-09 DIAGNOSIS — R05 Cough: Secondary | ICD-10-CM

## 2016-03-09 DIAGNOSIS — IMO0002 Reserved for concepts with insufficient information to code with codable children: Secondary | ICD-10-CM

## 2016-03-09 DIAGNOSIS — R0602 Shortness of breath: Secondary | ICD-10-CM

## 2016-03-09 DIAGNOSIS — J3089 Other allergic rhinitis: Secondary | ICD-10-CM | POA: Diagnosis not present

## 2016-03-09 DIAGNOSIS — R058 Other specified cough: Secondary | ICD-10-CM

## 2016-03-09 DIAGNOSIS — R053 Chronic cough: Secondary | ICD-10-CM

## 2016-03-09 MED ORDER — FLUTICASONE-SALMETEROL 230-21 MCG/ACT IN AERO: 2.0000 | INHALATION_SPRAY | Freq: Two times a day (BID) | RESPIRATORY_TRACT | 12 refills | Status: DC

## 2016-03-09 MED ORDER — MONTELUKAST SODIUM 10 MG PO TABS: 10.0000 mg | ORAL_TABLET | Freq: Every day | ORAL | 5 refills | Status: DC

## 2016-03-09 MED ORDER — ALBUTEROL SULFATE HFA 108 (90 BASE) MCG/ACT IN AERS: 2.0000 | INHALATION_SPRAY | Freq: Four times a day (QID) | RESPIRATORY_TRACT | 5 refills | Status: DC | PRN

## 2016-03-09 NOTE — Progress Notes (Signed)
Current Outpatient Prescriptions on File Prior to Visit  Medication Sig  . aspirin EC 81 MG tablet Take 81 mg by mouth every morning.  . Aspirin-Acetaminophen-Caffeine (EXCEDRIN PO) Take 2 tablets by mouth 2 (two) times daily as needed (migraine).  Marland Kitchen atenolol (TENORMIN) 25 MG tablet Take 25 mg by mouth every morning.   Marland Kitchen atorvastatin (LIPITOR) 80 MG tablet Take 40 mg by mouth at bedtime.  . cyclobenzaprine (FLEXERIL) 10 MG tablet Take 0.5-1 tablets (5-10 mg total) by mouth 3 (three) times daily as needed for muscle spasms.  . diazepam (VALIUM) 5 MG tablet Take 1 tablet (5 mg total) by mouth every 8 (eight) hours as needed (back pain).  Marland Kitchen diclofenac (VOLTAREN) 75 MG EC tablet Take 75 mg by mouth 2 (two) times daily.   . fluticasone (FLONASE) 50 MCG/ACT nasal spray Place 2 sprays into both nostrils daily.  Marland Kitchen Ketotifen Fumarate (THERA TEARS ALLERGY OP) Place 1 drop into both eyes daily as needed (dry eyes).  . Omega-3 Fatty Acids (FISH OIL) 1000 MG CPDR Take 2,000 mg by mouth 2 (two) times daily.    Marland Kitchen omeprazole (PRILOSEC) 20 MG capsule Take 20 mg by mouth daily.   Marland Kitchen topiramate (TOPAMAX) 25 MG tablet Take 25 mg by mouth daily.    Current Facility-Administered Medications on File Prior to Visit  Medication  . albuterol (PROVENTIL) (2.5 MG/3ML) 0.083% nebulizer solution 2.5 mg    Chief Complaint  Patient presents with  . Follow-up    Denies any current breathing issues - no recent asthma flares.     Tests HST 03/07/13 >> AHI 3.9, SaO2 low 80% PFT 02/19/09 >> FEV1 2.90 (90%), FEV1% 70, TLC 5.62(93%), DLCO 99%, +BD PFT 07/21/14 >> FEV1 2.70 (93%), FEV1% 74, TLC 5.97 (91%), RV 2.65 (126%), DLCO 106%, +BD RAST 07/21/14 >> react to dust mites, grasses; IgE 49 Nitrous oxide 10/27/15 >> 228  Past medical history HLD, HTN, GERD, BPH, HA, Osteoarthritis  Past surgical history, Allergies, Family history, Social history all reviewed.  Vital Signs BP 124/70 (BP Location: Left Arm, Cuff Size: Normal)    Pulse 75   Ht 5\' 7"  (1.702 m)   Wt 231 lb (104.8 kg)   SpO2 95%   BMI 36.18 kg/m   History of Present Illness: Douglas Carpenter is a 60 y.o. male former smoker with chronic cough in setting of Asthma, Post nasal drip with allergies, GERD, and vocal cord disease.  He also has hx of snoring.  He has been doing better.  He gets occasional cough and wheeze when he is working out in the yard, but not nearly as bad as before.  He will get flu shot and med refills at [facility] tomorrow.    He is snoring some, but doesn't feel like he has an issue with his sleep otherwise.  He wears a mouth guard for teeth grinding.  Physical Exam:  General - No distress ENT - No sinus tenderness, no oral exudate, no LAN, MP 4, scalloped tongue Cardiac - s1s2 regular, no murmur Chest - No wheeze/rales/dullness Back - No focal tenderness Abd - Soft, non-tender Ext - No edema Neuro - Normal strength Skin - No rashes Psych - normal mood, and behavior   Discussion: He has chronic cough related to allergic asthma, post-nasal drip, GERD, and VCD.  Assessment/Plan:  Persistent allergic asthma uncontrolled - continue advair HFA, singulair - prn albuterol - flu shot at [place] later this month  Exercise induced asthma. - singulair, advair -  warm up before exercising  Upper airway cough syndrome with post-nasal drip and allergic rhinitis. - discussed environmental controls to limit possible allergen exposure - prn nasal irrigation, flonase, allegra - continue singulair  GERD. - he is to continue omprezale per PCP  Vocal cord dysfunction. - improved - monitor clinically  Snoring. - HST from 02/2013 showed borderline apnea - advised him to d/w his dentist about oral appliance to help with snoring and bruxism   Patient Instructions  Follow up in 6 months   [name], MD [facility] Pager:  [phone] 03/09/2016, 10:13 AM

## 2016-03-09 NOTE — Patient Instructions (Signed)
Follow up in 6 months 

## 2016-07-19 DIAGNOSIS — M5416 Radiculopathy, lumbar region: Secondary | ICD-10-CM | POA: Diagnosis not present

## 2016-07-19 DIAGNOSIS — Z981 Arthrodesis status: Secondary | ICD-10-CM | POA: Diagnosis not present

## 2016-07-19 DIAGNOSIS — M546 Pain in thoracic spine: Secondary | ICD-10-CM | POA: Diagnosis not present

## 2016-07-19 DIAGNOSIS — M5440 Lumbago with sciatica, unspecified side: Secondary | ICD-10-CM | POA: Diagnosis not present

## 2016-07-19 DIAGNOSIS — M4726 Other spondylosis with radiculopathy, lumbar region: Secondary | ICD-10-CM | POA: Diagnosis not present

## 2016-07-19 DIAGNOSIS — Q763 Congenital scoliosis due to congenital bony malformation: Secondary | ICD-10-CM | POA: Diagnosis not present

## 2016-07-19 DIAGNOSIS — M5136 Other intervertebral disc degeneration, lumbar region: Secondary | ICD-10-CM | POA: Diagnosis not present

## 2016-07-19 DIAGNOSIS — G5711 Meralgia paresthetica, right lower limb: Secondary | ICD-10-CM | POA: Diagnosis not present

## 2016-07-22 DIAGNOSIS — M48061 Spinal stenosis, lumbar region without neurogenic claudication: Secondary | ICD-10-CM | POA: Diagnosis not present

## 2016-07-22 DIAGNOSIS — M5440 Lumbago with sciatica, unspecified side: Secondary | ICD-10-CM | POA: Diagnosis not present

## 2016-07-25 DIAGNOSIS — M5416 Radiculopathy, lumbar region: Secondary | ICD-10-CM | POA: Diagnosis not present

## 2016-07-25 DIAGNOSIS — M5440 Lumbago with sciatica, unspecified side: Secondary | ICD-10-CM | POA: Diagnosis not present

## 2016-07-25 DIAGNOSIS — G5711 Meralgia paresthetica, right lower limb: Secondary | ICD-10-CM | POA: Diagnosis not present

## 2016-07-25 DIAGNOSIS — M4726 Other spondylosis with radiculopathy, lumbar region: Secondary | ICD-10-CM | POA: Diagnosis not present

## 2016-07-25 DIAGNOSIS — M5136 Other intervertebral disc degeneration, lumbar region: Secondary | ICD-10-CM | POA: Diagnosis not present

## 2016-07-25 DIAGNOSIS — Q763 Congenital scoliosis due to congenital bony malformation: Secondary | ICD-10-CM | POA: Diagnosis not present

## 2016-07-25 DIAGNOSIS — Z6836 Body mass index (BMI) 36.0-36.9, adult: Secondary | ICD-10-CM | POA: Diagnosis not present

## 2016-07-25 DIAGNOSIS — M5126 Other intervertebral disc displacement, lumbar region: Secondary | ICD-10-CM | POA: Diagnosis not present

## 2016-07-25 DIAGNOSIS — Z981 Arthrodesis status: Secondary | ICD-10-CM | POA: Diagnosis not present

## 2016-08-02 DIAGNOSIS — R972 Elevated prostate specific antigen [PSA]: Secondary | ICD-10-CM | POA: Diagnosis not present

## 2016-08-02 DIAGNOSIS — N5201 Erectile dysfunction due to arterial insufficiency: Secondary | ICD-10-CM | POA: Diagnosis not present

## 2016-09-05 ENCOUNTER — Ambulatory Visit: Payer: Medicare Other | Admitting: Pulmonary Disease

## 2016-09-13 ENCOUNTER — Ambulatory Visit: Payer: Medicare Other | Admitting: Adult Health

## 2016-09-27 ENCOUNTER — Ambulatory Visit (INDEPENDENT_AMBULATORY_CARE_PROVIDER_SITE_OTHER): Payer: Medicare Other | Admitting: Adult Health

## 2016-09-27 ENCOUNTER — Encounter: Payer: Self-pay | Admitting: Adult Health

## 2016-09-27 DIAGNOSIS — R0602 Shortness of breath: Secondary | ICD-10-CM

## 2016-09-27 DIAGNOSIS — J4520 Mild intermittent asthma, uncomplicated: Secondary | ICD-10-CM | POA: Diagnosis not present

## 2016-09-27 DIAGNOSIS — J301 Allergic rhinitis due to pollen: Secondary | ICD-10-CM

## 2016-09-27 MED ORDER — FLUTICASONE-SALMETEROL 230-21 MCG/ACT IN AERO: 2.0000 | INHALATION_SPRAY | Freq: Two times a day (BID) | RESPIRATORY_TRACT | 5 refills | Status: DC

## 2016-09-27 MED ORDER — ALBUTEROL SULFATE HFA 108 (90 BASE) MCG/ACT IN AERS: 2.0000 | INHALATION_SPRAY | Freq: Four times a day (QID) | RESPIRATORY_TRACT | 5 refills | Status: DC | PRN

## 2016-09-27 MED ORDER — MONTELUKAST SODIUM 10 MG PO TABS: 10.0000 mg | ORAL_TABLET | Freq: Every day | ORAL | 5 refills | Status: DC

## 2016-09-27 NOTE — Assessment & Plan Note (Signed)
Encouraged on Advair compliance  Cont w/ trigger control   Plan  Patient Instructions  Increase  Advair 2 puffs Twice daily  , rinse after use.  Continue on Singulair daily  Use Flonase As needed   Follow up with Dr. Halford Chessman  In 6 months and As needed

## 2016-09-27 NOTE — Progress Notes (Signed)
@Patient  ID: Douglas Carpenter, male    DOB: 1956-01-30, 61 y.o.   MRN: [id number]  Chief Complaint  Patient presents with  . Follow-up    Asthma     Referring provider: No ref. provider found  HPI: 61 yo male former smoker followed for Asthma , Chronic Cough , AR , VCD .   TEST  HST 03/07/13 >> AHI 3.9, SaO2 low 80% PFT 02/19/09 >> FEV1 2.90 (90%), FEV1% 70, TLC 5.62(93%), DLCO 99%, +BD PFT 07/21/14 >> FEV1 2.70 (93%), FEV1% 74, TLC 5.97 (91%), RV 2.65 (126%), DLCO 106%, +BD RAST 07/21/14 >> react to dust mites, grasses; IgE 49 Nitrous oxide 10/27/15 >> 228  09/27/2016 Follow up: Asthma/AR  Pt returns for 6 months follow up . Says overall he is doing well . Has occasional wheezing.  Not taking Advair on regular basis . We discussed using it on regular basis.  No fever or chest pain .   Has AR . Take singulair daily.  No flare of nasal congestion . Has Flonase As needed  .    Allergies  Allergen Reactions  . Aspirin Rash    Pt can tolerate low doses-- INTOLERANT TO HIGHER DOSES    Immunization History  Administered Date(s) Administered  . Influenza Split 03/21/2012  . Influenza Whole 03/08/2011  . Influenza,inj,Quad PF,36+ Mos 05/26/2014, 03/06/2015  . Pneumococcal Conjugate-13 05/26/2014  . Pneumococcal Polysaccharide-23 11/30/2012    Past Medical History:  Diagnosis Date  . Arthritis    "everywhere"  . BPH (benign prostatic hypertrophy)   . Chronic rhinitis 01/27/2009  . ED (erectile dysfunction)   . GERD (gastroesophageal reflux disease)   . Headache(784.0)    migraines, as many as 3 in a week    . HYPERLIPIDEMIA 01/27/2009  . HYPERTENSION 01/27/2009  . Mild asthma    seasonal allergies, uses Proair once per yr.   . Mild obstructive sleep apnea    per pt -- mild osa test result--  no rx cpap needed  . Testosterone deficiency   . Urethral stricture   . Vocal cord anomaly    pt states told from [place]  doctor heard a little gurgle sound -- no farther testing done     Tobacco History: History  Smoking Status  . Former Smoker  . Packs/day: 0.50  . Years: 22.00  . Types: Cigarettes  . Quit date: 06/27/1988  Smokeless Tobacco  . Never Used   Counseling given: Not Answered   Outpatient Encounter Prescriptions as of 09/27/2016  Medication Sig  . albuterol (PROAIR HFA) 108 (90 Base) MCG/ACT inhaler Inhale 2 puffs into the lungs every 6 (six) hours as needed for wheezing or shortness of breath.  Marland Kitchen aspirin EC 81 MG tablet Take 81 mg by mouth every morning.  . Aspirin-Acetaminophen-Caffeine (EXCEDRIN PO) Take 2 tablets by mouth 2 (two) times daily as needed (migraine).  Marland Kitchen atenolol (TENORMIN) 25 MG tablet Take 25 mg by mouth every morning.   Marland Kitchen atorvastatin (LIPITOR) 80 MG tablet Take 40 mg by mouth at bedtime.  . cyclobenzaprine (FLEXERIL) 10 MG tablet Take 0.5-1 tablets (5-10 mg total) by mouth 3 (three) times daily as needed for muscle spasms.  . diazepam (VALIUM) 5 MG tablet Take 1 tablet (5 mg total) by mouth every 8 (eight) hours as needed (back pain).  Marland Kitchen diclofenac (VOLTAREN) 75 MG EC tablet Take 75 mg by mouth 2 (two) times daily.   . fluticasone (FLONASE) 50 MCG/ACT nasal spray Place 2 sprays into both  nostrils daily.  . fluticasone-salmeterol (ADVAIR HFA) 230-21 MCG/ACT inhaler Inhale 2 puffs into the lungs 2 (two) times daily.  Marland Kitchen Ketotifen Fumarate (THERA TEARS ALLERGY OP) Place 1 drop into both eyes daily as needed (dry eyes).  . montelukast (SINGULAIR) 10 MG tablet Take 1 tablet (10 mg total) by mouth at bedtime.  . Omega-3 Fatty Acids (FISH OIL) 1000 MG CPDR Take 2,000 mg by mouth 2 (two) times daily.    Marland Kitchen omeprazole (PRILOSEC) 20 MG capsule Take 20 mg by mouth daily.   Marland Kitchen topiramate (TOPAMAX) 25 MG tablet Take 25 mg by mouth daily.   . [DISCONTINUED] albuterol (PROAIR HFA) 108 (90 Base) MCG/ACT inhaler Inhale 2 puffs into the lungs every 6 (six) hours as needed for wheezing or shortness of breath.  . [DISCONTINUED] fluticasone-salmeterol  (ADVAIR HFA) 230-21 MCG/ACT inhaler Inhale 2 puffs into the lungs 2 (two) times daily.  . [DISCONTINUED] montelukast (SINGULAIR) 10 MG tablet Take 1 tablet (10 mg total) by mouth at bedtime.   Facility-Administered Encounter Medications as of 09/27/2016  Medication  . albuterol (PROVENTIL) (2.5 MG/3ML) 0.083% nebulizer solution 2.5 mg     Review of Systems  Constitutional:   No  weight loss, night sweats,  Fevers, chills, fatigue, or  lassitude.  HEENT:   No headaches,  Difficulty swallowing,  Tooth/dental problems, or  Sore throat,                No sneezing, itching, ear ache, nasal congestion, post nasal drip,   CV:  No chest pain,  Orthopnea, PND, swelling in lower extremities, anasarca, dizziness, palpitations, syncope.   GI  No heartburn, indigestion, abdominal pain, nausea, vomiting, diarrhea, change in bowel habits, loss of appetite, bloody stools.   Resp: No chest wall deformity  Skin: no rash or lesions.  GU: no dysuria, change in color of urine, no urgency or frequency.  No flank pain, no hematuria   MS:  No joint pain or swelling.  No decreased range of motion.  No back pain.    Physical Exam  BP 124/74 (BP Location: Left Arm, Cuff Size: Normal)   Pulse 92   Ht 5\' 7"  (1.702 m)   Wt 227 lb 6.4 oz (103.1 kg)   SpO2 95%   BMI 35.62 kg/m   GEN: A/Ox3; pleasant , NAD,    HEENT:  [place]/AT,  EACs-clear, TMs-wnl, NOSE-clear, THROAT-clear, no lesions, no postnasal drip or exudate noted.   NECK:  Supple w/ fair ROM; no JVD; normal carotid impulses w/o bruits; no thyromegaly or nodules palpated; no lymphadenopathy.    RESP  Clear  P & A; w/o, wheezes/ rales/ or rhonchi. no accessory muscle use, no dullness to percussion  CARD:  RRR, no m/r/g, no peripheral edema, pulses intact, no cyanosis or clubbing.  GI:   Soft & nt; nml bowel sounds; no organomegaly or masses detected.   Musco: Warm bil, no deformities or joint swelling noted.   Neuro: alert, no focal deficits  noted.    Skin: Warm, no lesions or rashes    Lab Results:   BNP No results found for: BNP  ProBNP No results found for: PROBNP  Imaging: No results found.   Assessment & Plan:   Asthma Encouraged on Advair compliance  Cont w/ trigger control   Plan  Patient Instructions  Increase  Advair 2 puffs Twice daily  , rinse after use.  Continue on Singulair daily  Use Flonase As needed   Follow up with Dr. Halford Chessman  In 6 months and As needed      CHRONIC RHINITIS Cont on Singulair .  flonase As needed       Rexene Edison, NP 09/27/2016

## 2016-09-27 NOTE — Patient Instructions (Signed)
Increase  Advair 2 puffs Twice daily  , rinse after use.  Continue on Singulair daily  Use Flonase As needed   Follow up with Dr. Halford Chessman  In 6 months and As needed

## 2016-09-27 NOTE — Assessment & Plan Note (Signed)
Cont on Singulair .  flonase As needed

## 2016-09-30 DIAGNOSIS — I10 Essential (primary) hypertension: Secondary | ICD-10-CM | POA: Diagnosis not present

## 2016-09-30 DIAGNOSIS — Z981 Arthrodesis status: Secondary | ICD-10-CM | POA: Diagnosis not present

## 2016-09-30 DIAGNOSIS — M5416 Radiculopathy, lumbar region: Secondary | ICD-10-CM | POA: Diagnosis not present

## 2016-09-30 DIAGNOSIS — Q763 Congenital scoliosis due to congenital bony malformation: Secondary | ICD-10-CM | POA: Diagnosis not present

## 2016-09-30 DIAGNOSIS — M5440 Lumbago with sciatica, unspecified side: Secondary | ICD-10-CM | POA: Diagnosis not present

## 2016-09-30 DIAGNOSIS — M4726 Other spondylosis with radiculopathy, lumbar region: Secondary | ICD-10-CM | POA: Diagnosis not present

## 2016-09-30 DIAGNOSIS — M5136 Other intervertebral disc degeneration, lumbar region: Secondary | ICD-10-CM | POA: Diagnosis not present

## 2016-09-30 DIAGNOSIS — M5126 Other intervertebral disc displacement, lumbar region: Secondary | ICD-10-CM | POA: Diagnosis not present

## 2016-09-30 DIAGNOSIS — Z6835 Body mass index (BMI) 35.0-35.9, adult: Secondary | ICD-10-CM | POA: Diagnosis not present

## 2016-10-04 ENCOUNTER — Telehealth: Payer: Self-pay | Admitting: Pulmonary Disease

## 2016-10-04 DIAGNOSIS — R0602 Shortness of breath: Secondary | ICD-10-CM

## 2016-10-04 NOTE — Telephone Encounter (Signed)
[id number] x1 

## 2016-10-05 MED ORDER — FLUTICASONE-SALMETEROL 230-21 MCG/ACT IN AERO: 2.0000 | INHALATION_SPRAY | Freq: Two times a day (BID) | RESPIRATORY_TRACT | 11 refills | Status: DC

## 2016-10-05 MED ORDER — ALBUTEROL SULFATE HFA 108 (90 BASE) MCG/ACT IN AERS: 2.0000 | INHALATION_SPRAY | Freq: Four times a day (QID) | RESPIRATORY_TRACT | 3 refills | Status: DC | PRN

## 2016-10-05 NOTE — Telephone Encounter (Signed)
Spoke with pt, who request refills on advair and proair to be faxed to [facility].  Pt was seen in office on 09/27/16 and was given RX. pt states he would like Rx to be faxed, as he has never taken to [place]. Rx has been sent to [place]. Nothing further needed

## 2016-10-06 DIAGNOSIS — M48061 Spinal stenosis, lumbar region without neurogenic claudication: Secondary | ICD-10-CM | POA: Diagnosis not present

## 2016-10-06 DIAGNOSIS — M4726 Other spondylosis with radiculopathy, lumbar region: Secondary | ICD-10-CM | POA: Diagnosis not present

## 2016-10-06 DIAGNOSIS — M5136 Other intervertebral disc degeneration, lumbar region: Secondary | ICD-10-CM | POA: Diagnosis not present

## 2016-11-07 ENCOUNTER — Telehealth: Payer: Self-pay | Admitting: Pulmonary Disease

## 2016-11-07 MED ORDER — FLUTICASONE-SALMETEROL 230-21 MCG/ACT IN AERO: 2.0000 | INHALATION_SPRAY | Freq: Two times a day (BID) | RESPIRATORY_TRACT | 11 refills | Status: DC

## 2016-11-07 NOTE — Telephone Encounter (Signed)
Spoke with pt's wife Vivien Rota, requesting refill on Advair HFA to [facility] on [address].  This has been sent.  Nothing further needed.

## 2016-12-16 DIAGNOSIS — M5136 Other intervertebral disc degeneration, lumbar region: Secondary | ICD-10-CM | POA: Diagnosis not present

## 2016-12-16 DIAGNOSIS — M5416 Radiculopathy, lumbar region: Secondary | ICD-10-CM | POA: Diagnosis not present

## 2016-12-16 DIAGNOSIS — Q763 Congenital scoliosis due to congenital bony malformation: Secondary | ICD-10-CM | POA: Diagnosis not present

## 2016-12-16 DIAGNOSIS — M4726 Other spondylosis with radiculopathy, lumbar region: Secondary | ICD-10-CM | POA: Diagnosis not present

## 2016-12-16 DIAGNOSIS — Z6835 Body mass index (BMI) 35.0-35.9, adult: Secondary | ICD-10-CM | POA: Diagnosis not present

## 2016-12-16 DIAGNOSIS — M5126 Other intervertebral disc displacement, lumbar region: Secondary | ICD-10-CM | POA: Diagnosis not present

## 2016-12-16 DIAGNOSIS — Z981 Arthrodesis status: Secondary | ICD-10-CM | POA: Diagnosis not present

## 2016-12-22 ENCOUNTER — Ambulatory Visit: Payer: Medicare Other | Admitting: Urgent Care

## 2016-12-24 ENCOUNTER — Encounter: Payer: Self-pay | Admitting: Urgent Care

## 2016-12-24 ENCOUNTER — Ambulatory Visit (INDEPENDENT_AMBULATORY_CARE_PROVIDER_SITE_OTHER): Payer: Medicare Other | Admitting: Urgent Care

## 2016-12-24 VITALS — BP 146/75 | HR 92 | Temp 98.1°F | Resp 16 | Ht 67.0 in | Wt 223.0 lb

## 2016-12-24 DIAGNOSIS — L299 Pruritus, unspecified: Secondary | ICD-10-CM

## 2016-12-24 DIAGNOSIS — R21 Rash and other nonspecific skin eruption: Secondary | ICD-10-CM | POA: Diagnosis not present

## 2016-12-24 DIAGNOSIS — L089 Local infection of the skin and subcutaneous tissue, unspecified: Secondary | ICD-10-CM

## 2016-12-24 MED ORDER — PREDNISONE 20 MG PO TABS: ORAL_TABLET | ORAL | 0 refills | Status: DC

## 2016-12-24 MED ORDER — CEPHALEXIN 500 MG PO CAPS: 500.0000 mg | ORAL_CAPSULE | Freq: Three times a day (TID) | ORAL | 0 refills | Status: DC

## 2016-12-24 NOTE — Progress Notes (Signed)
  MRN: [id number] DOB: 1955/07/16  Subjective:   Douglas Carpenter is a 61 y.o. male presenting for chief complaint of Poison Ivy  Reports 4 day history of rash that started over right forearm and has now spread to his left arm, torso, neck and ears. Patient was working in the yard, came into contact with a plant he was not familiar with. Rash started thereafter. It has been very itchy, painful. Has formed blisters over his arms. Has used otc creams and benadryl without any relief. Denies fever, chest pain, shob, facial swelling, n/v, abdominal pain. Denies genital or eye involvement of rash.   Douglas Carpenter has a current medication list which includes the following prescription(s): albuterol, aspirin ec, aspirin-acetaminophen-caffeine, atenolol, atorvastatin, cyclobenzaprine, diclofenac, fluticasone, fluticasone-salmeterol, ketotifen fumarate, montelukast, fish oil, omeprazole, and topiramate, and the following Facility-Administered Medications: albuterol. Also is allergic to aspirin. Douglas Carpenter  has a past medical history of Arthritis; BPH (benign prostatic hypertrophy); Chronic rhinitis (01/27/2009); ED (erectile dysfunction); GERD (gastroesophageal reflux disease); Headache(784.0); HYPERLIPIDEMIA (01/27/2009); HYPERTENSION (01/27/2009); Mild asthma; Mild obstructive sleep apnea; Testosterone deficiency; Urethral stricture; and Vocal cord anomaly. Also  has a past surgical history that includes Lumbar laminectomy/decompression microdiscectomy (Right, 11/29/2012); Shoulder arthroscopy w/ acromial repair (Left, 08-28-2002); and Cystoscopy with urethral dilatation (N/A, 01/07/2013).  Objective:   Vitals: BP (!) 146/75   Pulse 92   Temp 98.1 F (36.7 C) (Oral)   Resp 16   Ht 5\' 7"  (1.702 m)   Wt 223 lb (101.2 kg)   SpO2 96%   BMI 34.93 kg/m   Physical Exam  Constitutional: He is oriented to person, place, and time. He appears well-developed and well-nourished.  HENT:  Mouth/Throat: Oropharynx is clear and moist.    Eyes: Right eye exhibits no discharge. Left eye exhibits no discharge.  Neck: Normal range of motion. Neck supple.  Cardiovascular: Normal rate, regular rhythm and intact distal pulses.  Exam reveals no gallop and no friction rub.   No murmur heard. Pulmonary/Chest: No respiratory distress. He has no wheezes. He has no rales.  Neurological: He is alert and oriented to person, place, and time.  Skin: Skin is warm and dry. Rash (multiple excoritations clusters of vesicles of varying sizes between 0.5cm-2cm; rash involves arms bilaterally, portions of his torso and neck bilaterally with ears) noted.   Assessment and Plan :   1. Rash and nonspecific skin eruption 2. Itching 3. Superficial skin infection - Rash consistent with contact dermatitis due to plant. Will use steroid course. Will cover for secondary skin infection with Keflex. Counseled patient on potential for adverse effects with medications prescribed today, patient verbalized understanding. Return-to-clinic precautions discussed, patient verbalized understanding.   Douglas Eagles, PA-C [facility] [phone] 12/24/2016  10:22 AM

## 2016-12-24 NOTE — Patient Instructions (Addendum)
Poison Ivy Dermatitis Poison ivy dermatitis is inflammation of the skin that is caused by the allergens on the leaves of the poison ivy plant. The skin reaction often involves redness, swelling, blisters, and extreme itching. What are the causes? This condition is caused by a specific chemical (urushiol) found in the sap of the poison ivy plant. This chemical is sticky and can be easily spread to people, animals, and objects. You can get poison ivy dermatitis by:  Having direct contact with a poison ivy plant.  Touching animals, other people, or objects that have come in contact with poison ivy and have the chemical on them.  What increases the risk? This condition is more likely to develop in:  People who are outdoors often.  People who go outdoors without wearing protective clothing, such as closed shoes, long pants, and a long-sleeved shirt.  What are the signs or symptoms? Symptoms of this condition include:  Redness and itching.  A rash that often includes bumps and blisters. The rash usually appears 48 hours after exposure.  Swelling. This may occur if the reaction is more severe.  Symptoms usually last for 1-2 weeks. However, the first time you develop this condition, symptoms may last 3-4 weeks. How is this diagnosed? This condition may be diagnosed based on your symptoms and a physical exam. Your health care provider may also ask you about any recent outdoor activity. How is this treated? Treatment for this condition will vary depending on how severe it is. Treatment may include:  Hydrocortisone creams or calamine lotions to relieve itching.  Oatmeal baths to soothe the skin.  Over-the-counter antihistamine tablets.  Oral steroid medicine for more severe outbreaks.  Follow these instructions at home:  Take or apply over-the-counter and prescription medicines only as told by your health care provider.  Wash exposed skin as soon as possible with soap and cold  water.  Use hydrocortisone creams or calamine lotion as needed to soothe the skin and relieve itching.  Take oatmeal baths as needed. Use colloidal oatmeal. You can get this at your local pharmacy or grocery store. Follow the instructions on the packaging.  Do not scratch or rub your skin.  While you have the rash, wash clothes right after you wear them. How is this prevented?  Learn to identify the poison ivy plant and avoid contact with the plant. This plant can be recognized by the number of leaves. Generally, poison ivy has three leaves with flowering branches on a single stem. The leaves are typically glossy, and they have jagged edges that come to a point at the front.  If you have been exposed to poison ivy, thoroughly wash with soap and water right away. You have about 30 minutes to remove the plant resin before it will cause the rash. Be sure to wash under your fingernails because any plant resin there will continue to spread the rash.  When hiking or camping, wear clothes that will help you to avoid exposure on the skin. This includes long pants, a long-sleeved shirt, tall socks, and hiking boots. You can also apply preventive lotion to your skin to help limit exposure.  If you suspect that your clothes or outdoor gear came in contact with poison ivy, rinse them off outside with a garden hose before you bring them inside your house. Contact a health care provider if:  You have open sores in the rash area.  You have more redness, swelling, or pain in the affected area.  You have   redness that spreads beyond the rash area.  You have fluid, blood, or pus coming from the affected area.  You have a fever.  You have a rash over a large area of your body.  You have a rash on your eyes, mouth, or genitals.  Your rash does not improve after a few days. Get help right away if:  Your face swells or your eyes swell shut.  You have trouble breathing.  You have trouble  swallowing. This information is not intended to replace advice given to you by your health care provider. Make sure you discuss any questions you have with your health care provider. Document Released: 06/10/2000 Document Revised: 11/19/2015 Document Reviewed: 11/19/2014 Elsevier Interactive Patient Education  2018 [employer]     IF you received an x-ray today, you will receive an invoice from [facility]. Please contact [facility] at [phone] with questions or concerns regarding your invoice.   IF you received labwork today, you will receive an invoice from [facility]. Please contact [facility] at [phone] with questions or concerns regarding your invoice.   Our billing staff will not be able to assist you with questions regarding bills from these companies.  You will be contacted with the lab results as soon as they are available. The fastest way to get your results is to activate your My Chart account. Instructions are located on the last page of this paperwork. If you have not heard from us regarding the results in 2 weeks, please contact this office.      

## 2017-01-26 DIAGNOSIS — M48061 Spinal stenosis, lumbar region without neurogenic claudication: Secondary | ICD-10-CM | POA: Diagnosis not present

## 2017-01-26 DIAGNOSIS — M4726 Other spondylosis with radiculopathy, lumbar region: Secondary | ICD-10-CM | POA: Diagnosis not present

## 2017-01-26 DIAGNOSIS — M5136 Other intervertebral disc degeneration, lumbar region: Secondary | ICD-10-CM | POA: Diagnosis not present

## 2017-01-26 DIAGNOSIS — M5416 Radiculopathy, lumbar region: Secondary | ICD-10-CM | POA: Diagnosis not present

## 2017-02-09 ENCOUNTER — Telehealth: Payer: Self-pay | Admitting: Family Medicine

## 2017-02-09 NOTE — Telephone Encounter (Signed)
Pt is needing a refill on his p. ivey medication  Best number [phone]

## 2017-02-10 ENCOUNTER — Encounter: Payer: Self-pay | Admitting: Physician Assistant

## 2017-02-10 ENCOUNTER — Other Ambulatory Visit: Payer: Self-pay

## 2017-02-10 ENCOUNTER — Ambulatory Visit (INDEPENDENT_AMBULATORY_CARE_PROVIDER_SITE_OTHER): Payer: Medicare Other | Admitting: Physician Assistant

## 2017-02-10 VITALS — BP 130/90 | HR 78 | Temp 98.5°F | Resp 16 | Ht 66.5 in | Wt 217.8 lb

## 2017-02-10 DIAGNOSIS — R21 Rash and other nonspecific skin eruption: Secondary | ICD-10-CM

## 2017-02-10 DIAGNOSIS — L255 Unspecified contact dermatitis due to plants, except food: Secondary | ICD-10-CM | POA: Diagnosis not present

## 2017-02-10 MED ORDER — PREDNISONE 20 MG PO TABS: ORAL_TABLET | ORAL | 0 refills | Status: DC

## 2017-02-10 MED ORDER — METHYLPREDNISOLONE ACETATE 80 MG/ML IJ SUSP
80.0000 mg | Freq: Once | INTRAMUSCULAR | Status: AC
  Administered 2017-02-10: 80 mg via INTRAMUSCULAR

## 2017-02-10 MED ORDER — TRIAMCINOLONE ACETONIDE 0.1 % EX CREA: 1.0000 "application " | TOPICAL_CREAM | Freq: Two times a day (BID) | CUTANEOUS | 0 refills | Status: DC

## 2017-02-10 NOTE — Progress Notes (Signed)
Douglas Carpenter  MRN: [id number] DOB: [birth year]  PCP: Wendie Agreste, MD  Subjective:  Pt is a pleasant 61 year old male who presents to clinic for rash x 3 days. He came in contact with poison ivy in his neighbors yard. He c/o itchy rash on b/l forearms. He has had this in the past and states "it gets real bad real quick".  Last OV for this problem was 11/2016, he was Rx Keflex for secondary bacterial infection.  Denies shob, wheezing, fever, chills, swelling.    Review of Systems  Constitutional: Negative for chills and fever.  Respiratory: Negative for chest tightness, shortness of breath and wheezing.   Gastrointestinal: Negative for abdominal pain, nausea and vomiting.  Skin: Positive for wound.  Neurological: Negative for dizziness and headaches.  Psychiatric/Behavioral: Negative for sleep disturbance.    Patient Active Problem List   Diagnosis Date Noted  . Exercise-induced asthma 07/21/2014  . Chronic cough 07/21/2014  . Upper airway cough syndrome 07/21/2014  . Allergic rhinitis 07/21/2014  . Lumbar stenosis 11/06/2013  . Urethral stricture 01/07/2013  . Prostatic hypertrophy 11/12/2012  . VOCAL CORD DISORDER 02/19/2009  . HYPERLIPIDEMIA 01/27/2009  . Snoring 01/27/2009  . HYPERTENSION 01/27/2009  . CHRONIC RHINITIS 01/27/2009  . Asthma 01/27/2009  . GERD 01/27/2009    Current Outpatient Prescriptions on File Prior to Visit  Medication Sig Dispense Refill  . albuterol (PROAIR HFA) 108 (90 Base) MCG/ACT inhaler Inhale 2 puffs into the lungs every 6 (six) hours as needed for wheezing or shortness of breath. 1 Inhaler 3  . aspirin EC 81 MG tablet Take 81 mg by mouth every morning.    . Aspirin-Acetaminophen-Caffeine (EXCEDRIN PO) Take 2 tablets by mouth 2 (two) times daily as needed (migraine).    Marland Kitchen atenolol (TENORMIN) 25 MG tablet Take 25 mg by mouth every morning.     Marland Kitchen atorvastatin (LIPITOR) 80 MG tablet Take 40 mg by mouth at bedtime.    . cyclobenzaprine  (FLEXERIL) 10 MG tablet Take 0.5-1 tablets (5-10 mg total) by mouth 3 (three) times daily as needed for muscle spasms. 50 tablet 1  . diclofenac (VOLTAREN) 75 MG EC tablet Take 75 mg by mouth 2 (two) times daily.     . fluticasone (FLONASE) 50 MCG/ACT nasal spray Place 2 sprays into both nostrils daily. 16 g 2  . fluticasone-salmeterol (ADVAIR HFA) 230-21 MCG/ACT inhaler Inhale 2 puffs into the lungs 2 (two) times daily. 1 Inhaler 11  . Ketotifen Fumarate (THERA TEARS ALLERGY OP) Place 1 drop into both eyes daily as needed (dry eyes).    . montelukast (SINGULAIR) 10 MG tablet Take 1 tablet (10 mg total) by mouth at bedtime. 30 tablet 5  . Omega-3 Fatty Acids (FISH OIL) 1000 MG CPDR Take 2,000 mg by mouth 2 (two) times daily.      Marland Kitchen omeprazole (PRILOSEC) 20 MG capsule Take 20 mg by mouth daily.     Marland Kitchen topiramate (TOPAMAX) 25 MG tablet Take 25 mg by mouth daily.      Current Facility-Administered Medications on File Prior to Visit  Medication Dose Route Frequency Provider Last Rate Last Dose  . albuterol (PROVENTIL) (2.5 MG/3ML) 0.083% nebulizer solution 2.5 mg  2.5 mg Nebulization Once Darlyne Russian, MD        Allergies  Allergen Reactions  . Aspirin Rash    Pt can tolerate low doses-- INTOLERANT TO HIGHER DOSES     Objective:  BP 130/90 (BP Location: Left Arm,  Cuff Size: Large)   Pulse 78   Temp 98.5 F (36.9 C) (Oral)   Resp 16   Ht 5' 6.5" (1.689 m)   Wt 217 lb 12.8 oz (98.8 kg)   SpO2 100%   BMI 34.63 kg/m   Physical Exam  Constitutional: He is oriented to person, place, and time and well-developed, well-nourished, and in no distress.  Neurological: He is alert and oriented to person, place, and time.  Skin: Rash (bullous rash central b/l forearms. No drainge, erythema or swelling. ) noted.  Psychiatric: Mood, memory, affect and judgment normal.    Assessment and Plan :  1. Rash and nonspecific skin eruption 2. Plant dermatitis - methylPREDNISolone acetate (DEPO-MEDROL)  injection 80 mg; Inject 1 mL (80 mg total) into the muscle once. - triamcinolone cream (KENALOG) 0.1 %; Apply 1 application topically 2 (two) times daily. For poison ivy  Dispense: 30 g; Refill: 0 - predniSONE (DELTASONE) 20 MG tablet; Take 3 PO QAM x5days, 2 PO QAM x5days, 1 PO QAM x5days  Dispense: 30 tablet; Refill: 0 - Due to pt's hypersensitivity to plant dermatitis, will treat in office with IM Depo-medrol as well as PO prednisone. RTC in 5-7 days if no improvement. He agrees with plan.   Mercer Pod, PA-C  [facility] 02/10/2017 9:44 AM

## 2017-02-10 NOTE — Patient Instructions (Addendum)
If your symptoms worsen in the next week, fill the prescription for Prednisone.  Come back if you are not improving.  Good luck with your neighbors!!!  Thank you for coming in today. I hope you feel we met your needs.  Feel free to call PCP if you have any questions or further requests.  Please consider signing up for MyChart if you do not already have it, as this is a great way to communicate with me.  Best,  Mercer Pod, PA-C    Poison Ivy Dermatitis Poison ivy dermatitis is inflammation of the skin that is caused by the allergens on the leaves of the poison ivy plant. The skin reaction often involves redness, swelling, blisters, and extreme itching. What are the causes? This condition is caused by a specific chemical (urushiol) found in the sap of the poison ivy plant. This chemical is sticky and can be easily spread to people, animals, and objects. You can get poison ivy dermatitis by:  Having direct contact with a poison ivy plant.  Touching animals, other people, or objects that have come in contact with poison ivy and have the chemical on them.  What increases the risk? This condition is more likely to develop in:  People who are outdoors often.  People who go outdoors without wearing protective clothing, such as closed shoes, long pants, and a long-sleeved shirt.  What are the signs or symptoms? Symptoms of this condition include:  Redness and itching.  A rash that often includes bumps and blisters. The rash usually appears 48 hours after exposure.  Swelling. This may occur if the reaction is more severe.  Symptoms usually last for 1-2 weeks. However, the first time you develop this condition, symptoms may last 3-4 weeks. How is this diagnosed? This condition may be diagnosed based on your symptoms and a physical exam. Your health care provider may also ask you about any recent outdoor activity. How is this treated? Treatment for this condition will vary depending  on how severe it is. Treatment may include:  Hydrocortisone creams or calamine lotions to relieve itching.  Oatmeal baths to soothe the skin.  Over-the-counter antihistamine tablets.  Oral steroid medicine for more severe outbreaks.  Follow these instructions at home:  Take or apply over-the-counter and prescription medicines only as told by your health care provider.  Wash exposed skin as soon as possible with soap and cold water.  Use hydrocortisone creams or calamine lotion as needed to soothe the skin and relieve itching.  Take oatmeal baths as needed. Use colloidal oatmeal. You can get this at your local pharmacy or grocery store. Follow the instructions on the packaging.  Do not scratch or rub your skin.  While you have the rash, wash clothes right after you wear them. How is this prevented?  Learn to identify the poison ivy plant and avoid contact with the plant. This plant can be recognized by the number of leaves. Generally, poison ivy has three leaves with flowering branches on a single stem. The leaves are typically glossy, and they have jagged edges that come to a point at the front.  If you have been exposed to poison ivy, thoroughly wash with soap and water right away. You have about 30 minutes to remove the plant resin before it will cause the rash. Be sure to wash under your fingernails because any plant resin there will continue to spread the rash.  When hiking or camping, wear clothes that will help you to avoid exposure  on the skin. This includes long pants, a long-sleeved shirt, tall socks, and hiking boots. You can also apply preventive lotion to your skin to help limit exposure.  If you suspect that your clothes or outdoor gear came in contact with poison ivy, rinse them off outside with a garden hose before you bring them inside your house. Contact a health care provider if:  You have open sores in the rash area.  You have more redness, swelling, or pain in  the affected area.  You have redness that spreads beyond the rash area.  You have fluid, blood, or pus coming from the affected area.  You have a fever.  You have a rash over a large area of your body.  You have a rash on your eyes, mouth, or genitals.  Your rash does not improve after a few days. Get help right away if:  Your face swells or your eyes swell shut.  You have trouble breathing.  You have trouble swallowing. This information is not intended to replace advice given to you by your health care provider. Make sure you discuss any questions you have with your health care provider. Document Released: 06/10/2000 Document Revised: 11/19/2015 Document Reviewed: 11/19/2014 Elsevier Interactive Patient Education  2018 [employer].   IF you received an x-ray today, you will receive an invoice from [facility]. Please contact [facility] at [phone] with questions or concerns regarding your invoice.   IF you received labwork today, you will receive an invoice from [facility]. Please contact [facility] at [phone] with questions or concerns regarding your invoice.   Our billing staff will not be able to assist you with questions regarding bills from these companies.  You will be contacted with the lab results as soon as they are available. The fastest way to get your results is to activate your My Chart account. Instructions are located on the last page of this paperwork. If you have not heard from [place] regarding the results in 2 weeks, please contact this office.

## 2017-02-11 MED ORDER — PREDNISONE 20 MG PO TABS: ORAL_TABLET | ORAL | 0 refills | Status: DC

## 2017-02-11 MED ORDER — TRIAMCINOLONE ACETONIDE 0.1 % EX CREA: 1.0000 "application " | TOPICAL_CREAM | Freq: Two times a day (BID) | CUTANEOUS | 0 refills | Status: DC

## 2017-02-11 NOTE — Telephone Encounter (Signed)
They were sent to [facility] on [address]. He should be able to pick them up there.  Thank you!

## 2017-02-11 NOTE — Telephone Encounter (Signed)
Please advise. Looks like prednisone was prescribed for his poison ivy. Does patient need to come back for OV or can you fill that and the cream for him ?

## 2017-02-11 NOTE — Telephone Encounter (Signed)
LVM that rx was ready for pick up

## 2017-03-17 DIAGNOSIS — Q763 Congenital scoliosis due to congenital bony malformation: Secondary | ICD-10-CM | POA: Diagnosis not present

## 2017-03-17 DIAGNOSIS — M5126 Other intervertebral disc displacement, lumbar region: Secondary | ICD-10-CM | POA: Diagnosis not present

## 2017-03-17 DIAGNOSIS — Z6834 Body mass index (BMI) 34.0-34.9, adult: Secondary | ICD-10-CM | POA: Diagnosis not present

## 2017-03-17 DIAGNOSIS — I10 Essential (primary) hypertension: Secondary | ICD-10-CM | POA: Diagnosis not present

## 2017-03-17 DIAGNOSIS — G5711 Meralgia paresthetica, right lower limb: Secondary | ICD-10-CM | POA: Diagnosis not present

## 2017-03-17 DIAGNOSIS — M5416 Radiculopathy, lumbar region: Secondary | ICD-10-CM | POA: Diagnosis not present

## 2017-03-17 DIAGNOSIS — M5136 Other intervertebral disc degeneration, lumbar region: Secondary | ICD-10-CM | POA: Diagnosis not present

## 2017-03-17 DIAGNOSIS — M4726 Other spondylosis with radiculopathy, lumbar region: Secondary | ICD-10-CM | POA: Diagnosis not present

## 2017-03-17 DIAGNOSIS — Z981 Arthrodesis status: Secondary | ICD-10-CM | POA: Diagnosis not present

## 2017-03-24 ENCOUNTER — Telehealth: Payer: Self-pay

## 2017-03-24 NOTE — Telephone Encounter (Signed)
Called pt to schedule Medicare Annual Wellness Visit. -nr  

## 2017-03-29 ENCOUNTER — Ambulatory Visit: Payer: Medicare Other | Admitting: Pulmonary Disease

## 2017-04-03 ENCOUNTER — Ambulatory Visit (INDEPENDENT_AMBULATORY_CARE_PROVIDER_SITE_OTHER): Payer: Medicare Other | Admitting: Pulmonary Disease

## 2017-04-03 ENCOUNTER — Encounter: Payer: Self-pay | Admitting: Pulmonary Disease

## 2017-04-03 VITALS — BP 132/80 | HR 74 | Ht 67.0 in | Wt 217.4 lb

## 2017-04-03 DIAGNOSIS — J4540 Moderate persistent asthma, uncomplicated: Secondary | ICD-10-CM | POA: Diagnosis not present

## 2017-04-03 DIAGNOSIS — Z23 Encounter for immunization: Secondary | ICD-10-CM

## 2017-04-03 MED ORDER — MONTELUKAST SODIUM 10 MG PO TABS: 10.0000 mg | ORAL_TABLET | Freq: Every day | ORAL | 5 refills | Status: DC

## 2017-04-03 MED ORDER — FLUTICASONE-SALMETEROL 230-21 MCG/ACT IN AERO: 2.0000 | INHALATION_SPRAY | Freq: Two times a day (BID) | RESPIRATORY_TRACT | 11 refills | Status: DC

## 2017-04-03 NOTE — Progress Notes (Signed)
Current Outpatient Prescriptions on File Prior to Visit  Medication Sig  . albuterol (PROAIR HFA) 108 (90 Base) MCG/ACT inhaler Inhale 2 puffs into the lungs every 6 (six) hours as needed for wheezing or shortness of breath.  Marland Kitchen aspirin EC 81 MG tablet Take 81 mg by mouth every morning.  . Aspirin-Acetaminophen-Caffeine (EXCEDRIN PO) Take 2 tablets by mouth 2 (two) times daily as needed (migraine).  Marland Kitchen atenolol (TENORMIN) 25 MG tablet Take 25 mg by mouth every morning.   Marland Kitchen atorvastatin (LIPITOR) 80 MG tablet Take 40 mg by mouth at bedtime.  . cyclobenzaprine (FLEXERIL) 10 MG tablet Take 0.5-1 tablets (5-10 mg total) by mouth 3 (three) times daily as needed for muscle spasms.  . diclofenac (VOLTAREN) 75 MG EC tablet Take 75 mg by mouth 2 (two) times daily.   Marland Kitchen Ketotifen Fumarate (THERA TEARS ALLERGY OP) Place 1 drop into both eyes daily as needed (dry eyes).  . Omega-3 Fatty Acids (FISH OIL) 1000 MG CPDR Take 2,000 mg by mouth 2 (two) times daily.    Marland Kitchen omeprazole (PRILOSEC) 20 MG capsule Take 20 mg by mouth daily.   Marland Kitchen topiramate (TOPAMAX) 25 MG tablet Take 25 mg by mouth daily.   . fluticasone (FLONASE) 50 MCG/ACT nasal spray Place 2 sprays into both nostrils daily. (Patient not taking: Reported on 04/03/2017)   Current Facility-Administered Medications on File Prior to Visit  Medication  . albuterol (PROVENTIL) (2.5 MG/3ML) 0.083% nebulizer solution 2.5 mg    Chief Complaint  Patient presents with  . Follow-up    Pt doing well over all, no changes. Pt would like flu shot today, and pt's wife is pt here requesting flu shot as well today. Pt needs refill for singulair today.    Tests HST 03/07/13 >> AHI 3.9, SaO2 low 80% PFT 02/19/09 >> FEV1 2.90 (90%), FEV1% 70, TLC 5.62(93%), DLCO 99%, +BD PFT 07/21/14 >> FEV1 2.70 (93%), FEV1% 74, TLC 5.97 (91%), RV 2.65 (126%), DLCO 106%, +BD RAST 07/21/14 >> react to dust mites, grasses; IgE 49 Nitrous oxide 10/27/15 >> 228  Past medical history HLD,  HTN, GERD, BPH, HA, Osteoarthritis  Past surgical history, Allergies, Family history, Social history all reviewed.  Vital Signs BP 132/80 (BP Location: Left Arm, Cuff Size: Normal)   Pulse 74   Ht 5\' 7"  (1.702 m)   Wt 217 lb 6.4 oz (98.6 kg)   SpO2 96%   BMI 34.05 kg/m   History of Present Illness: Douglas Carpenter is a 61 y.o. male former smoker with chronic cough in setting of Asthma, Post nasal drip with allergies, GERD, and vocal cord disease.  He also has hx of snoring.  He changed his diet and started walking more.  He has lost about 10 lbs.  He is not snoring as much, and no longer grinding teeth.  He is not having sinus congestion, sore throat, chest tightness, or wheeze.  He uses advair twice per day, and singulair at night.  Not needing flonase at this time.  Not using albuterol much.  Physical Exam:  General - pleasant Eyes - pupils reactive ENT - no sinus tenderness, no oral exudate, no LAN Cardiac - regular, no murmur Chest - no wheeze, rales Abd - soft, non tender Ext - no edema Skin - no rashes Neuro - normal strength Psych - normal mood  Discussion: He has chronic cough related to allergic asthma, post-nasal drip, GERD, and VCD.  Assessment/Plan:  Allergic asthma with exercise induced bronchoconstriction. -  finally doing well on current regimen - continue advair HFA, singulair, and prn albuterol - flu shot today - [facility] can get alternative to advair  Upper airway cough syndrome. - continue singulair - prn flonase and OTC antihistamine  GERD. - prilosec per PCP  Snoring. - improved with weight loss   Patient Instructions  Flu shot today  Follow up in 6 months   [name], MD [employer] [facility]/Critical Care/Sleep Pager:  [phone] 04/03/2017, 12:17 PM

## 2017-04-03 NOTE — Patient Instructions (Signed)
Flu shot today Follow up in 6 months 

## 2017-04-19 ENCOUNTER — Encounter: Payer: Self-pay | Admitting: Physical Therapy

## 2017-04-19 ENCOUNTER — Ambulatory Visit: Payer: Medicare Other | Attending: Neurosurgery | Admitting: Physical Therapy

## 2017-04-19 ENCOUNTER — Ambulatory Visit (INDEPENDENT_AMBULATORY_CARE_PROVIDER_SITE_OTHER): Payer: Medicare Other

## 2017-04-19 VITALS — BP 130/78 | HR 79 | Temp 98.2°F | Ht 67.0 in | Wt 223.2 lb

## 2017-04-19 DIAGNOSIS — Z0000 Encounter for general adult medical examination without abnormal findings: Secondary | ICD-10-CM | POA: Diagnosis not present

## 2017-04-19 DIAGNOSIS — M6281 Muscle weakness (generalized): Secondary | ICD-10-CM | POA: Diagnosis not present

## 2017-04-19 DIAGNOSIS — M25651 Stiffness of right hip, not elsewhere classified: Secondary | ICD-10-CM | POA: Diagnosis not present

## 2017-04-19 DIAGNOSIS — Z1159 Encounter for screening for other viral diseases: Secondary | ICD-10-CM | POA: Diagnosis not present

## 2017-04-19 DIAGNOSIS — M5442 Lumbago with sciatica, left side: Secondary | ICD-10-CM | POA: Insufficient documentation

## 2017-04-19 DIAGNOSIS — G8929 Other chronic pain: Secondary | ICD-10-CM | POA: Insufficient documentation

## 2017-04-19 DIAGNOSIS — M25652 Stiffness of left hip, not elsewhere classified: Secondary | ICD-10-CM

## 2017-04-19 DIAGNOSIS — I10 Essential (primary) hypertension: Secondary | ICD-10-CM

## 2017-04-19 DIAGNOSIS — E785 Hyperlipidemia, unspecified: Secondary | ICD-10-CM

## 2017-04-19 DIAGNOSIS — Z114 Encounter for screening for human immunodeficiency virus [HIV]: Secondary | ICD-10-CM | POA: Diagnosis not present

## 2017-04-19 NOTE — Therapy (Signed)
[facility] [address] [place], [place], [address] Phone: [phone]   Fax:  [phone]  Physical Therapy Evaluation  Patient Details  Name: Douglas Carpenter MRN: [id number] Date of Birth: Nov 27, 1955 Referring Provider: Dr. Jovita Gamma  Encounter Date: 04/19/2017      PT End of Session - 04/19/17 1526    Visit Number 1   Number of Visits 10   Date for PT Re-Evaluation 05/31/17   Authorization Type Medicare/tricare; No PTA;    PT Start Time 1445   PT Stop Time 1525   PT Time Calculation (min) 40 min   Activity Tolerance Patient tolerated treatment well   Behavior During Therapy WFL for tasks assessed/performed      Past Medical History:  Diagnosis Date  . Arthritis    "everywhere"  . BPH (benign prostatic hypertrophy)   . Chronic rhinitis 01/27/2009  . ED (erectile dysfunction)   . GERD (gastroesophageal reflux disease)   . Headache(784.0)    migraines, as many as 3 in a week    . HYPERLIPIDEMIA 01/27/2009  . HYPERTENSION 01/27/2009  . Mild asthma    seasonal allergies, uses Proair once per yr.   . Mild obstructive sleep apnea    per pt -- mild osa test result--  no rx cpap needed  . Testosterone deficiency   . Urethral stricture   . Vocal cord anomaly    pt states told from [place]  doctor heard a little gurgle sound -- no farther testing done    Past Surgical History:  Procedure Laterality Date  . CYSTOSCOPY WITH URETHRAL DILATATION N/A 01/07/2013   Procedure: CYSTOSCOPY WITH URETHRAL DILATATION BALLOON DILATION ;  Surgeon: Bernestine Amass, MD;  Location: [facility];  Service: Urology;  Laterality: N/A;  . LUMBAR LAMINECTOMY/DECOMPRESSION MICRODISCECTOMY Right 11/29/2012   Procedure: Right Lumbar Four to Five Laminectomy/ Decompression Microdiskectomy;  Surgeon: Otilio Connors, MD;  Location: [facility];  Service: Neurosurgery;  Laterality: Right;  LUMBAR LAMINECTOMY/DECOMPRESSION  MICRODISCECTOMY 1 LEVEL  . SHOULDER ARTHROSCOPY W/ ACROMIAL REPAIR Left 08-28-2002    There were no vitals filed for this visit.       Subjective Assessment - 04/19/17 1457    Subjective Patient reports low back pain that shoots down back of left leg to the second toe intermittently.  Right lateral thigh with standing 5 minutes will go numb. Pain in low back.  2 days ago his dog knocked him down and increased pain.    Limitations Sitting   How long can you sit comfortably? ride for 1.5 hours with weight shifting   How long can you stand comfortably? 5 minutes and right thigh goes numb   How long can you walk comfortably? no pain with walking   Patient Stated Goals reduction of pain   Currently in Pain? Yes   Pain Score 8    Pain Location Back   Pain Orientation Right;Left;Lower   Pain Descriptors / Indicators Aching   Pain Type Chronic pain   Pain Onset More than a month ago   Pain Frequency Intermittent   Aggravating Factors  standing and sitting   Pain Relieving Factors walking   Multiple Pain Sites No            [facility] PT Assessment - 04/19/17 0001      Assessment   Medical Diagnosis M47.26 other spondylosis with radiculopathy, lumbar region   Referring Provider Dr. Jovita Gamma   Onset Date/Surgical Date  10/18/16   Prior Therapy yes     Precautions   Precautions Back   Precaution Comments no lifting over 25#     Restrictions   Weight Bearing Restrictions No     Balance Screen   Has the patient fallen in the past 6 months Yes   How many times? 1  dog knocked him down   Has the patient had a decrease in activity level because of a fear of falling?  No   Is the patient reluctant to leave their home because of a fear of falling?  No     Home Ecologist residence     Prior Function   Level of Independence Independent   Vocation Retired   Leisure walk daily     Cognition   Overall Cognitive Status Within Functional Limits  for tasks assessed     Observation/Other Assessments   Focus on Therapeutic Outcomes (FOTO)  50% limitation     Posture/Postural Control   Posture/Postural Control No significant limitations     ROM / Strength   AROM / PROM / Strength AROM;Strength;PROM     AROM   Lumbar Flexion decreased by 25%   Lumbar Extension decreased by 50%   Lumbar - Right Side Bend decreased by 25%   Lumbar - Left Side Bend decreased by 25%     PROM   Right Hip External Rotation  50   Right Hip Internal Rotation  5   Left Hip Flexion 100   Left Hip External Rotation  48   Left Hip Internal Rotation  10     Strength   Right Hip Flexion 3+/5   Right Hip Extension 3-/5   Right Hip ABduction 3-/5   Left Hip Extension 3-/5   Left Hip ABduction 3-/5   Right Knee Extension 4/5   Left Knee Extension 4/5     Right Hip   Right Hip Flexion 95     Palpation   SI assessment  pelvis in correct alignment            Objective measurements completed on examination: See above findings.                  PT Education - 04/19/17 1526    Education provided Yes   Education Details butterfly stretch; hamstring stretch   Person(s) Educated Patient   Methods Explanation;Demonstration;Verbal cues;Handout   Comprehension Returned demonstration;Verbalized understanding          PT Short Term Goals - 04/19/17 1621      PT SHORT TERM GOAL #1   Title independent with initial HEP   Time 4   Period Weeks   Status New   Target Date 05/17/17     PT SHORT TERM GOAL #2   Title bilateral hip flexion increased by 5-10 degrees so he is able to sit with greater ease and less numbness in right lateral thigh   Time 4   Period Weeks   Status New   Target Date 05/17/17     PT SHORT TERM GOAL #3   Title ability to brace his abdominals correctly so he is able to perform transitional movements ie, sit to stand, in and out of car and in and out of bed, with moderate pain   Time 4   Period Weeks    Status New   Target Date 05/17/17     PT SHORT TERM GOAL #4   Title understand correct body mechanics  with daily activities to assist in pain management and improve function   Time 4   Period Weeks   Status New   Target Date 05/17/17           PT Long Term Goals - 04/19/17 1631      PT LONG TERM GOAL #1   Title independent with HEP and understand how to progress himself   Time 8   Period Weeks   Status New   Target Date 06/14/17     PT LONG TERM GOAL #2   Title increased bilateral hip strength >/= 4/5 so he is able to perform daily activities with greater ease    Time 8   Period Weeks   Status New   Target Date 06/14/17     PT LONG TERM GOAL #3   Title FOTO score </= 46% limitation   Time 8   Period Weeks   Status New   Target Date 06/14/17     PT LONG TERM GOAL #4   Title improved hip rotation and flexion  due to increased flexibility so he is able to get in and out of the car with >/= 50% greater ease   Time 8   Period Weeks   Status New   Target Date 06/14/17                Plan - 04/19/17 1527    Clinical Impression Statement Patient is a 61 year old male with lumbar pain with multiple back surgeries.  Patient reports back pain is 8/10 intermittently.  He has no pain with walking.  Patient will have numbness in right lateral thigh when standing more than 5 min.  Patient has increased difficulty with sitting more than 1.5 hours to drive due to back pain.  Patient lumbar ROM is limited by 25%.  Patient has decreased bilateral hip ROM for flexion and rotation by 50%.  Patient bilateral hip strength  averages 3-/5.  Patient bilateral knee extension strength is 4/5.  Patient will benefit from skilled therapy to reduce pain, improve mobility for daily tasks and improve core strength.    History and Personal Factors relevant to plan of care: 3 back surgeries; diabete; left shoulder surgery   Clinical Presentation Evolving   Clinical Presentation due to: back  pain and limited hip mobility and decreased strength makes it difficult for pateint to exercise safely   Clinical Decision Making Moderate   Rehab Potential Excellent   Clinical Impairments Affecting Rehab Potential 3 back surgeries; diabete; left shoulder surgery; no lifting more than 20#   PT Frequency 2x / week   PT Duration 6 weeks   PT Treatment/Interventions Cryotherapy;Electrical Stimulation;Moist Heat;Ultrasound;Therapeutic activities;Therapeutic exercise;Balance training;Neuromuscular re-education;Patient/family education;Manual techniques;Dry needling;Energy conservation   PT Next Visit Plan hip stretches; joint mobilization to bilateral hips; hip strength; core strength; body mechanincs   PT Home Exercise Plan progress as needed   Consulted and Agree with Plan of Care Patient      Patient will benefit from skilled therapeutic intervention in order to improve the following deficits and impairments:  Decreased range of motion, Difficulty walking, Increased fascial restricitons, Decreased endurance, Decreased activity tolerance, Pain, Increased muscle spasms, Decreased strength, Decreased mobility  Visit Diagnosis: Muscle weakness (generalized) - Plan: PT plan of care cert/re-cert  Chronic right-sided low back pain with left-sided sciatica - Plan: PT plan of care cert/re-cert  Stiffness of right hip, not elsewhere classified - Plan: PT plan of care cert/re-cert  Stiffness of left hip,  not elsewhere classified - Plan: PT plan of care cert/re-cert      G-Codes - 68/61/68 1638    Functional Assessment Tool Used (Outpatient Only) fOTO score 50% limitation  goal is 46% limitation   Functional Limitation Other PT primary   Other PT Primary Current Status (H7290) At least 40 percent but less than 60 percent impaired, limited or restricted   Other PT Primary Goal Status (S1115) At least 40 percent but less than 60 percent impaired, limited or restricted       Problem List Patient  Active Problem List   Diagnosis Date Noted  . Exercise-induced asthma 07/21/2014  . Chronic cough 07/21/2014  . Upper airway cough syndrome 07/21/2014  . Allergic rhinitis 07/21/2014  . Lumbar stenosis 11/06/2013  . Urethral stricture 01/07/2013  . Prostatic hypertrophy 11/12/2012  . VOCAL CORD DISORDER 02/19/2009  . HYPERLIPIDEMIA 01/27/2009  . Snoring 01/27/2009  . HYPERTENSION 01/27/2009  . CHRONIC RHINITIS 01/27/2009  . Asthma 01/27/2009  . GERD 01/27/2009    Earlie Counts, PT 04/19/17 4:40 PM   [facility] [address], [place], [place], [address] Phone: [phone]   Fax:  [phone]  Name: Douglas Carpenter MRN: [id number] Date of Birth: 1956/04/15

## 2017-04-19 NOTE — Patient Instructions (Addendum)
  Copyright  VHI. All rights reserved.  Chair Sitting    Sit at edge of seat, spine straight, one leg extended. Put a hand on each thigh and bend forward from the hip, keeping spine straight. Allow hand on extended leg to reach toward toes. Support upper body with other arm. Hold _30__ seconds. Repeat _2__ times per session. Do __2_ sessions per day.  Copyright  VHI. All rights reserved.  Butterfly, Supine    Lie on back, feet together. Lower knees toward floor. Hold _30__ seconds. Repeat _2__ times per session. Do __1_ sessions per day.  Copyright  VHI. All rights reserved.  [place] [address] [place], [place] [address] Phone # [phone] Fax [phone]

## 2017-04-19 NOTE — Progress Notes (Signed)
Subjective:   Douglas Carpenter is a 61 y.o. male who presents for an Initial Medicare Annual Wellness Visit.  Review of Systems  N/A Cardiac Risk Factors include: advanced age (>68men, >28 women);male gender;hypertension;dyslipidemia;obesity (BMI >30kg/m2);sedentary lifestyle    Objective:    Today's Vitals   04/19/17 0930  BP: 130/78  Pulse: 79  Temp: 98.2 F (36.8 C)  TempSrc: Oral  SpO2: 98%  Weight: 223 lb 4 oz (101.3 kg)  Height: 5\' 7"  (1.702 m)   Body mass index is 34.97 kg/m.  Current Medications (verified) Outpatient Encounter Prescriptions as of 04/19/2017  Medication Sig  . albuterol (PROAIR HFA) 108 (90 Base) MCG/ACT inhaler Inhale 2 puffs into the lungs every 6 (six) hours as needed for wheezing or shortness of breath.  Marland Kitchen aspirin EC 81 MG tablet Take 81 mg by mouth every morning.  . Aspirin-Acetaminophen-Caffeine (EXCEDRIN PO) Take 2 tablets by mouth 2 (two) times daily as needed (migraine).  Marland Kitchen atenolol (TENORMIN) 25 MG tablet Take 25 mg by mouth every morning.   Marland Kitchen atorvastatin (LIPITOR) 80 MG tablet Take 40 mg by mouth at bedtime.  . cyclobenzaprine (FLEXERIL) 10 MG tablet Take 0.5-1 tablets (5-10 mg total) by mouth 3 (three) times daily as needed for muscle spasms.  . diclofenac (VOLTAREN) 75 MG EC tablet Take 75 mg by mouth 2 (two) times daily.   . fluticasone-salmeterol (ADVAIR HFA) 230-21 MCG/ACT inhaler Inhale 2 puffs into the lungs 2 (two) times daily.  Marland Kitchen Ketotifen Fumarate (THERA TEARS ALLERGY OP) Place 1 drop into both eyes daily as needed (dry eyes).  . montelukast (SINGULAIR) 10 MG tablet Take 1 tablet (10 mg total) by mouth at bedtime.  . Omega-3 Fatty Acids (FISH OIL) 1000 MG CPDR Take 2,000 mg by mouth 2 (two) times daily.    Marland Kitchen omeprazole (PRILOSEC) 20 MG capsule Take 20 mg by mouth daily.   Marland Kitchen topiramate (TOPAMAX) 25 MG tablet Take 25 mg by mouth daily.   . [DISCONTINUED] fluticasone (FLONASE) 50 MCG/ACT nasal spray Place 2 sprays into both nostrils  daily. (Patient not taking: Reported on 04/03/2017)   Facility-Administered Encounter Medications as of 04/19/2017  Medication  . albuterol (PROVENTIL) (2.5 MG/3ML) 0.083% nebulizer solution 2.5 mg    Allergies (verified) Aspirin   History: Past Medical History:  Diagnosis Date  . Arthritis    "everywhere"  . BPH (benign prostatic hypertrophy)   . Chronic rhinitis 01/27/2009  . ED (erectile dysfunction)   . GERD (gastroesophageal reflux disease)   . Headache(784.0)    migraines, as many as 3 in a week    . HYPERLIPIDEMIA 01/27/2009  . HYPERTENSION 01/27/2009  . Mild asthma    seasonal allergies, uses Proair once per yr.   . Mild obstructive sleep apnea    per pt -- mild osa test result--  no rx cpap needed  . Testosterone deficiency   . Urethral stricture   . Vocal cord anomaly    pt states told from [place]  doctor heard a little gurgle sound -- no farther testing done   Past Surgical History:  Procedure Laterality Date  . CYSTOSCOPY WITH URETHRAL DILATATION N/A 01/07/2013   Procedure: CYSTOSCOPY WITH URETHRAL DILATATION BALLOON DILATION ;  Surgeon: Bernestine Amass, MD;  Location: [facility];  Service: Urology;  Laterality: N/A;  . LUMBAR LAMINECTOMY/DECOMPRESSION MICRODISCECTOMY Right 11/29/2012   Procedure: Right Lumbar Four to Five Laminectomy/ Decompression Microdiskectomy;  Surgeon: Otilio Connors, MD;  Location: [facility];  Service: Neurosurgery;  Laterality: Right;  LUMBAR LAMINECTOMY/DECOMPRESSION MICRODISCECTOMY 1 LEVEL  . SHOULDER ARTHROSCOPY W/ ACROMIAL REPAIR Left 08-28-2002   Family History  Problem Relation Age of Onset  . Cancer Mother        lung  . Heart disease Maternal Grandmother   . Heart disease Maternal Grandfather   . Cancer Maternal Grandfather        colon, prostate,lung   Social History   Occupational History  . retired    Social History Main Topics  . Smoking status: Former Smoker    Packs/day: 0.50    Years: 22.00    Types:  Cigarettes    Quit date: 06/27/1988  . Smokeless tobacco: Never Used  . Alcohol use 1.8 oz/week    3 Standard drinks or equivalent per week     Comment: 4-5 drinks / day- cognac   . Drug use: No  . Sexual activity: Not on file   Tobacco Counseling Counseling given: Not Answered   Activities of Daily Living In your present state of health, do you have any difficulty performing the following activities: 04/19/2017  Hearing? Y  Comment Patient wears hearing aids for both ears  Vision? N  Difficulty concentrating or making decisions? Y  Comment Patient has problems remembering things  Walking or climbing stairs? Y  Comment Patient has back pain and climbing stairs is very difficult  Dressing or bathing? Y  Comment Wife helps him with this  Doing errands, shopping? N  Preparing Food and eating ? N  Using the Toilet? N  In the past six months, have you accidently leaked urine? Y  Comment Patient has issues with urine leakage   Do you have problems with loss of bowel control? N  Managing your Medications? N  Managing your Finances? N  Housekeeping or managing your Housekeeping? N  Some recent data might be hidden    Immunizations and Health Maintenance Immunization History  Administered Date(s) Administered  . Influenza Split 03/21/2012  . Influenza Whole 03/08/2011  . Influenza,inj,Quad PF,6+ Mos 05/26/2014, 03/06/2015, 04/03/2017  . Pneumococcal Conjugate-13 05/26/2014  . Pneumococcal Polysaccharide-23 11/30/2012   Health Maintenance Due  Topic Date Due  . Hepatitis C Screening  1955-12-28  . HIV Screening  06/01/1970    Patient Care Team: Wendie Agreste, MD as PCP - General (Family Medicine) Jovita Gamma, MD as Consulting Physician (Neurosurgery) [name], MD as Consulting Physician (Pulmonary Disease)  Indicate any recent Medical Services you may have received from other than [facility] providers in the past year (date may be approximate).    Assessment:    This is a routine wellness examination for [name].   Hearing/Vision screen Vision Screening Comments: Patient sees an eye doctor at the [place] every year for his routine eye exams.   Dietary issues and exercise activities discussed: Current Exercise Habits: Home exercise routine, Type of exercise: walking, Time (Minutes): 60, Frequency (Times/Week): 7, Weekly Exercise (Minutes/Week): 420, Intensity: Moderate, Exercise limited by: None identified  Goals    . Weight (lb) < 200 lb (90.7 kg)          Patient states that he would like to lose weight and weight under 200 lbs in the near future.       Depression Screen PHQ 2/9 Scores 04/19/2017 02/10/2017 12/24/2016  PHQ - 2 Score 0 0 0  Exception Documentation - - Patient refusal    Fall Risk Fall Risk  04/19/2017 02/10/2017 12/24/2016  Falls in the past year? Yes No  No  Number falls in past yr: 1 - -  Injury with Fall? Yes - -  Comment Back pain  - -  Risk for fall due to : Impaired balance/gait - -  Follow up Falls prevention discussed - -    Cognitive Function:     6CIT Screen 04/19/2017  What Year? 0 points  What month? 0 points  What time? 0 points  Count back from 20 0 points  Months in reverse 0 points  Repeat phrase 2 points  Total Score 2    Screening Tests Health Maintenance  Topic Date Due  . Hepatitis C Screening  08-24-1955  . HIV Screening  06/01/1970  . TETANUS/TDAP  04/19/2018 (Originally 06/01/1974)  . COLONOSCOPY  06/27/2017  . INFLUENZA VACCINE  Completed        Plan:   I have personally reviewed and noted the following in the patient's chart:   . Medical and social history . Use of alcohol, tobacco or illicit drugs  . Current medications and supplements . Functional ability and status . Nutritional status . Physical activity . Advanced directives . List of other physicians . Hospitalizations, surgeries, and ER visits in previous 12 months . Vitals . Screenings to include cognitive, depression,  and falls . Referrals and appointments  In addition, I have reviewed and discussed with patient certain preventive protocols, quality metrics, and best practice recommendations. A written personalized care plan for preventive services as well as general preventive health recommendations were provided to patient.  Blood work ordered.    Andrez Grime, LPN   [id number]

## 2017-04-19 NOTE — Patient Instructions (Addendum)
Douglas Carpenter , Thank you for taking time to come for your Medicare Wellness Visit. I appreciate your ongoing commitment to your health goals. Please review the following plan we discussed and let me know if I can assist you in the future.   Screening recommendations/referrals: Colonoscopy: up to date, Check with the [facility] about having this repeated and send updated report to our office Recommended yearly ophthalmology/optometry visit for glaucoma screening and checkup Recommended yearly dental visit for hygiene and checkup  Vaccinations: Influenza vaccine: up to date Pneumococcal vaccine: up to date Tdap vaccine: due, declined due to insurance Shingles vaccine: Check with your pharmacy about receiving this vaccine    Advanced directives: Please bring a copy of your POA (Power of [name]) and/or Living Will to your next appointment.   Conditions/risks identified: Try to lose weight and weigh under 200 lbs in the near future.   Next appointment: schedule follow up visit within 2 weeks with PCP, 1 year for AWV  Preventive Care 40-64 Years, Male Preventive care refers to lifestyle choices and visits with your health care provider that can promote health and wellness. What does preventive care include?  A yearly physical exam. This is also called an annual well check.  Dental exams once or twice a year.  Routine eye exams. Ask your health care provider how often you should have your eyes checked.  Personal lifestyle choices, including:  Daily care of your teeth and gums.  Regular physical activity.  Eating a healthy diet.  Avoiding tobacco and drug use.  Limiting alcohol use.  Practicing safe sex.  Taking low-dose aspirin every day starting at age 70. What happens during an annual well check? The services and screenings done by your health care provider during your annual well check will depend on your age, overall health, lifestyle risk factors, and family history of  disease. Counseling  Your health care provider may ask you questions about your:  Alcohol use.  Tobacco use.  Drug use.  Emotional well-being.  Home and relationship well-being.  Sexual activity.  Eating habits.  Work and work Statistician. Screening  You may have the following tests or measurements:  Height, weight, and BMI.  Blood pressure.  Lipid and cholesterol levels. These may be checked every 5 years, or more frequently if you are over 67 years old.  Skin check.  Lung cancer screening. You may have this screening every year starting at age 54 if you have a 30-pack-year history of smoking and currently smoke or have quit within the past 15 years.  Fecal occult blood test (FOBT) of the stool. You may have this test every year starting at age 58.  Flexible sigmoidoscopy or colonoscopy. You may have a sigmoidoscopy every 5 years or a colonoscopy every 10 years starting at age 61.  Prostate cancer screening. Recommendations will vary depending on your family history and other risks.  Hepatitis C blood test.  Hepatitis B blood test.  Sexually transmitted disease (STD) testing.  Diabetes screening. This is done by checking your blood sugar (glucose) after you have not eaten for a while (fasting). You may have this done every 1-3 years. Discuss your test results, treatment options, and if necessary, the need for more tests with your health care provider. Vaccines  Your health care provider may recommend certain vaccines, such as:  Influenza vaccine. This is recommended every year.  Tetanus, diphtheria, and acellular pertussis (Tdap, Td) vaccine. You may need a Td booster every 10 years.  Zoster  vaccine. You may need this after age 39.  Pneumococcal 13-valent conjugate (PCV13) vaccine. You may need this if you have certain conditions and have not been vaccinated.  Pneumococcal polysaccharide (PPSV23) vaccine. You may need one or two doses if you smoke cigarettes  or if you have certain conditions. Talk to your health care provider about which screenings and vaccines you need and how often you need them. This information is not intended to replace advice given to you by your health care provider. Make sure you discuss any questions you have with your health care provider. Document Released: 07/10/2015 Document Revised: 03/02/2016 Document Reviewed: 04/14/2015 Elsevier Interactive Patient Education  2017 [place] Prevention in the Home Falls can cause injuries. They can happen to people of all ages. There are many things you can do to make your home safe and to help prevent falls. What can I do on the outside of my home?  Regularly fix the edges of walkways and driveways and fix any cracks.  Remove anything that might make you trip as you walk through a door, such as a raised step or threshold.  Trim any bushes or trees on the path to your home.  Use bright outdoor lighting.  Clear any walking paths of anything that might make someone trip, such as rocks or tools.  Regularly check to see if handrails are loose or broken. Make sure that both sides of any steps have handrails.  Any raised decks and porches should have guardrails on the edges.  Have any leaves, snow, or ice cleared regularly.  Use sand or salt on walking paths during winter.  Clean up any spills in your garage right away. This includes oil or grease spills. What can I do in the bathroom?  Use night lights.  Install grab bars by the toilet and in the tub and shower. Do not use towel bars as grab bars.  Use non-skid mats or decals in the tub or shower.  If you need to sit down in the shower, use a plastic, non-slip stool.  Keep the floor dry. Clean up any water that spills on the floor as soon as it happens.  Remove soap buildup in the tub or shower regularly.  Attach bath mats securely with double-sided non-slip rug tape.  Do not have throw rugs and other  things on the floor that can make you trip. What can I do in the bedroom?  Use night lights.  Make sure that you have a light by your bed that is easy to reach.  Do not use any sheets or blankets that are too big for your bed. They should not hang down onto the floor.  Have a firm chair that has side arms. You can use this for support while you get dressed.  Do not have throw rugs and other things on the floor that can make you trip. What can I do in the kitchen?  Clean up any spills right away.  Avoid walking on wet floors.  Keep items that you use a lot in easy-to-reach places.  If you need to reach something above you, use a strong step stool that has a grab bar.  Keep electrical cords out of the way.  Do not use floor polish or wax that makes floors slippery. If you must use wax, use non-skid floor wax.  Do not have throw rugs and other things on the floor that can make you trip. What can I do with my  stairs?  Do not leave any items on the stairs.  Make sure that there are handrails on both sides of the stairs and use them. Fix handrails that are broken or loose. Make sure that handrails are as long as the stairways.  Check any carpeting to make sure that it is firmly attached to the stairs. Fix any carpet that is loose or worn.  Avoid having throw rugs at the top or bottom of the stairs. If you do have throw rugs, attach them to the floor with carpet tape.  Make sure that you have a light switch at the top of the stairs and the bottom of the stairs. If you do not have them, ask someone to add them for you. What else can I do to help prevent falls?  Wear shoes that:  Do not have high heels.  Have rubber bottoms.  Are comfortable and fit you well.  Are closed at the toe. Do not wear sandals.  If you use a stepladder:  Make sure that it is fully opened. Do not climb a closed stepladder.  Make sure that both sides of the stepladder are locked into place.  Ask  someone to hold it for you, if possible.  Clearly mark and make sure that you can see:  Any grab bars or handrails.  First and last steps.  Where the edge of each step is.  Use tools that help you move around (mobility aids) if they are needed. These include:  Canes.  Walkers.  Scooters.  Crutches.  Turn on the lights when you go into a dark area. Replace any light bulbs as soon as they burn out.  Set up your furniture so you have a clear path. Avoid moving your furniture around.  If any of your floors are uneven, fix them.  If there are any pets around you, be aware of where they are.  Review your medicines with your doctor. Some medicines can make you feel dizzy. This can increase your chance of falling. Ask your doctor what other things that you can do to help prevent falls. This information is not intended to replace advice given to you by your health care provider. Make sure you discuss any questions you have with your health care provider. Document Released: 04/09/2009 Document Revised: 11/19/2015 Document Reviewed: 07/18/2014 Elsevier Interactive Patient Education  2017 [employer].

## 2017-04-20 LAB — COMPREHENSIVE METABOLIC PANEL
ALT: 21 IU/L (ref 0–44)
AST: 18 IU/L (ref 0–40)
Albumin/Globulin Ratio: 1.7 (ref 1.2–2.2)
Albumin: 4.5 g/dL (ref 3.6–4.8)
Alkaline Phosphatase: 54 IU/L (ref 39–117)
BUN/Creatinine Ratio: 19 (ref 10–24)
BUN: 23 mg/dL (ref 8–27)
Bilirubin Total: 0.3 mg/dL (ref 0.0–1.2)
CO2: 23 mmol/L (ref 20–29)
Calcium: 9.2 mg/dL (ref 8.6–10.2)
Chloride: 106 mmol/L (ref 96–106)
Creatinine, Ser: 1.24 mg/dL (ref 0.76–1.27)
GFR calc Af Amer: 72 mL/min/{1.73_m2} (ref >59)
GFR calc non Af Amer: 62 mL/min/{1.73_m2} (ref >59)
Globulin, Total: 2.6 g/dL (ref 1.5–4.5)
Glucose: 109 mg/dL — ABNORMAL HIGH (ref 65–99)
Potassium: 4.5 mmol/L (ref 3.5–5.2)
Sodium: 145 mmol/L — ABNORMAL HIGH (ref 134–144)
Total Protein: 7.1 g/dL (ref 6.0–8.5)

## 2017-04-20 LAB — CBC WITH DIFFERENTIAL/PLATELET
Basophils Absolute: 0 10*3/uL (ref 0.0–0.2)
Basos: 1 %
EOS (ABSOLUTE): 0.2 10*3/uL (ref 0.0–0.4)
Eos: 4 %
Hematocrit: 43.3 % (ref 37.5–51.0)
Hemoglobin: 14.5 g/dL (ref 13.0–17.7)
Immature Grans (Abs): 0 10*3/uL (ref 0.0–0.1)
Immature Granulocytes: 0 %
Lymphocytes Absolute: 1.7 10*3/uL (ref 0.7–3.1)
Lymphs: 42 %
MCH: 29.5 pg (ref 26.6–33.0)
MCHC: 33.5 g/dL (ref 31.5–35.7)
MCV: 88 fL (ref 79–97)
Monocytes Absolute: 0.3 10*3/uL (ref 0.1–0.9)
Monocytes: 8 %
Neutrophils Absolute: 1.8 10*3/uL (ref 1.4–7.0)
Neutrophils: 45 %
Platelets: 206 10*3/uL (ref 150–379)
RBC: 4.92 x10E6/uL (ref 4.14–5.80)
RDW: 14.2 % (ref 12.3–15.4)
WBC: 4.1 10*3/uL (ref 3.4–10.8)

## 2017-04-20 LAB — URINALYSIS, COMPLETE
Bilirubin, UA: NEGATIVE
Glucose, UA: NEGATIVE
Ketones, UA: NEGATIVE
Leukocytes, UA: NEGATIVE
Nitrite, UA: NEGATIVE
Protein, UA: NEGATIVE
RBC, UA: NEGATIVE
Specific Gravity, UA: 1.017 (ref 1.005–1.030)
Urobilinogen, Ur: 0.2 mg/dL (ref 0.2–1.0)
pH, UA: 6.5 (ref 5.0–7.5)

## 2017-04-20 LAB — MICROSCOPIC EXAMINATION
Bacteria, UA: NONE SEEN
Casts: NONE SEEN /lpf
RBC, UA: NONE SEEN /hpf (ref 0–?)

## 2017-04-20 LAB — LIPID PANEL
Chol/HDL Ratio: 3.1 ratio (ref 0.0–5.0)
Cholesterol, Total: 222 mg/dL — ABNORMAL HIGH (ref 100–199)
HDL: 72 mg/dL (ref >39)
LDL Calculated: 137 mg/dL — ABNORMAL HIGH (ref 0–99)
Triglycerides: 67 mg/dL (ref 0–149)
VLDL Cholesterol Cal: 13 mg/dL (ref 5–40)

## 2017-04-20 LAB — HIV ANTIBODY (ROUTINE TESTING W REFLEX): HIV Screen 4th Generation wRfx: NONREACTIVE

## 2017-04-20 LAB — HEPATITIS C ANTIBODY: Hep C Virus Ab: 0.1 s/co ratio (ref 0.0–0.9)

## 2017-04-24 ENCOUNTER — Ambulatory Visit: Payer: Medicare Other

## 2017-04-24 DIAGNOSIS — M25651 Stiffness of right hip, not elsewhere classified: Secondary | ICD-10-CM | POA: Diagnosis not present

## 2017-04-24 DIAGNOSIS — M5442 Lumbago with sciatica, left side: Secondary | ICD-10-CM | POA: Diagnosis not present

## 2017-04-24 DIAGNOSIS — M25652 Stiffness of left hip, not elsewhere classified: Secondary | ICD-10-CM | POA: Diagnosis not present

## 2017-04-24 DIAGNOSIS — G8929 Other chronic pain: Secondary | ICD-10-CM

## 2017-04-24 DIAGNOSIS — M6281 Muscle weakness (generalized): Secondary | ICD-10-CM | POA: Diagnosis not present

## 2017-04-24 NOTE — Therapy (Signed)
[facility] [address] [place], [place], [address] Phone: [phone]   Fax:  [phone]  Physical Therapy Treatment  Patient Details  Name: Douglas Carpenter MRN: [id number] Date of Birth: [birth year] Referring Provider: Dr. Jovita Gamma  Encounter Date: 04/24/2017      PT End of Session - 04/24/17 1536    Visit Number 2   Number of Visits 10   Date for PT Re-Evaluation 05/31/17   Authorization Type Medicare/tricare; No PTA;    PT Start Time 1446   PT Stop Time 1532   PT Time Calculation (min) 46 min   Activity Tolerance Patient tolerated treatment well   Behavior During Therapy WFL for tasks assessed/performed      Past Medical History:  Diagnosis Date  . Arthritis    "everywhere"  . BPH (benign prostatic hypertrophy)   . Chronic rhinitis 01/27/2009  . ED (erectile dysfunction)   . GERD (gastroesophageal reflux disease)   . Headache(784.0)    migraines, as many as 3 in a week    . HYPERLIPIDEMIA 01/27/2009  . HYPERTENSION 01/27/2009  . Mild asthma    seasonal allergies, uses Proair once per yr.   . Mild obstructive sleep apnea    per pt -- mild osa test result--  no rx cpap needed  . Testosterone deficiency   . Urethral stricture   . Vocal cord anomaly    pt states told from [place]  doctor heard a little gurgle sound -- no farther testing done    Past Surgical History:  Procedure Laterality Date  . CYSTOSCOPY WITH URETHRAL DILATATION N/A 01/07/2013   Procedure: CYSTOSCOPY WITH URETHRAL DILATATION BALLOON DILATION ;  Surgeon: Bernestine Amass, MD;  [facility];  Service: Urology;  Laterality: N/A;  . LUMBAR LAMINECTOMY/DECOMPRESSION MICRODISCECTOMY Right 11/29/2012   Procedure: Right Lumbar Four to Five Laminectomy/ Decompression Microdiskectomy;  Surgeon: Otilio Connors, MD;  Location: [facility];  Service: Neurosurgery;  Laterality: Right;  LUMBAR LAMINECTOMY/DECOMPRESSION  MICRODISCECTOMY 1 LEVEL  . SHOULDER ARTHROSCOPY W/ ACROMIAL REPAIR Left 08-28-2002    There were no vitals filed for this visit.      Subjective Assessment - 04/24/17 1446    Subjective I'm OK.  I have been doing my stretches.     Currently in Pain? Yes   Pain Score 7    Pain Location Back   Pain Orientation Left;Lower;Right   Pain Descriptors / Indicators Aching   Pain Type Chronic pain   Pain Onset More than a month ago   Pain Frequency Intermittent   Aggravating Factors  standing and sitting   Pain Relieving Factors walking                         [facility] Adult PT Treatment/Exercise - 04/24/17 0001      Exercises   Exercises Lumbar;Knee/Hip     Lumbar Exercises: Stretches   Active Hamstring Stretch 2 reps;30 seconds   Single Knee to Chest Stretch 3 reps;20 seconds     Lumbar Exercises: Sidelying   Clam 20 reps     Knee/Hip Exercises: Stretches   Other Knee/Hip Stretches butterfly stretch 2x30 seconds     Knee/Hip Exercises: Aerobic   Nustep Level 2x 6 minutes   PT present to discuss progress     Knee/Hip Exercises: Seated   Long Arc Quad Both;2 sets;10 reps   Marching Strengthening;Both;2 sets;10 reps  PT Education - 04/24/17 1509    Education provided Yes   Education Details sidelying clams, knee to chest, body mechanics education   Person(s) Educated Patient   Methods Explanation;Demonstration   Comprehension Verbalized understanding;Returned demonstration          PT Short Term Goals - 04/24/17 1450      PT SHORT TERM GOAL #4   Title understand correct body mechanics with daily activities to assist in pain management and improve function   Baseline further training needed   Time 4   Period Weeks   Status On-going           PT Long Term Goals - 04/19/17 1631      PT LONG TERM GOAL #1   Title independent with HEP and understand how to progress himself   Time 8   Period Weeks   Status New   Target  Date 06/14/17     PT LONG TERM GOAL #2   Title increased bilateral hip strength >/= 4/5 so he is able to perform daily activities with greater ease    Time 8   Period Weeks   Status New   Target Date 06/14/17     PT LONG TERM GOAL #3   Title FOTO score </= 46% limitation   Time 8   Period Weeks   Status New   Target Date 06/14/17     PT LONG TERM GOAL #4   Title improved hip rotation and flexion  due to increased flexibility so he is able to get in and out of the car with >/= 50% greater ease   Time 8   Period Weeks   Status New   Target Date 06/14/17               Plan - 04/24/17 1447    Clinical Impression Statement Pt with only 1 session after evaluation.  Pt is independent in initial exercises for flexibility. Pt with LE weakness and bil hip stiffness.  Pt tolerated all exercises in the clinic well today and required verbal cues for technique.  Pt will benefit from skilled PT for strength and flexibility progression.     Rehab Potential Excellent   Clinical Impairments Affecting Rehab Potential 3 back surgeries; diabete; left shoulder surgery; no lifting more than 20#   PT Frequency 2x / week   PT Duration 6 weeks   PT Treatment/Interventions Cryotherapy;Electrical Stimulation;Moist Heat;Ultrasound;Therapeutic activities;Therapeutic exercise;Balance training;Neuromuscular re-education;Patient/family education;Manual techniques;Dry needling;Energy conservation   PT Next Visit Plan hip stretches; joint mobilization to bilateral hips; hip strength; core strength; body mechanincs   Consulted and Agree with Plan of Care Patient      Patient will benefit from skilled therapeutic intervention in order to improve the following deficits and impairments:  Decreased range of motion, Difficulty walking, Increased fascial restricitons, Decreased endurance, Decreased activity tolerance, Pain, Increased muscle spasms, Decreased strength, Decreased mobility  Visit Diagnosis: Muscle  weakness (generalized)  Chronic right-sided low back pain with left-sided sciatica  Stiffness of right hip, not elsewhere classified  Stiffness of left hip, not elsewhere classified     Problem List Patient Active Problem List   Diagnosis Date Noted  . Exercise-induced asthma 07/21/2014  . Chronic cough 07/21/2014  . Upper airway cough syndrome 07/21/2014  . Allergic rhinitis 07/21/2014  . Lumbar stenosis 11/06/2013  . Urethral stricture 01/07/2013  . Prostatic hypertrophy 11/12/2012  . VOCAL CORD DISORDER 02/19/2009  . HYPERLIPIDEMIA 01/27/2009  . Snoring 01/27/2009  . HYPERTENSION 01/27/2009  .  CHRONIC RHINITIS 01/27/2009  . Asthma 01/27/2009  . GERD 01/27/2009     Sigurd Sos, PT 04/24/17 3:38 PM  [facility] [address], [place], [place], [address] Phone: [phone]   Fax:  [phone]  Name: Douglas Carpenter MRN: [id number] Date of Birth: [birth year]

## 2017-04-24 NOTE — Patient Instructions (Addendum)
   Lifting Principles  .Maintain proper posture and head alignment. .Slide object as close as possible before lifting. .Move obstacles out of the way. .Test before lifting; ask for help if too heavy. .Tighten stomach muscles without holding breath. .Use smooth movements; do not jerk. .Use legs to do the work, and pivot with feet. .Distribute the work load symmetrically and close to the center of trunk. .Push instead of pull whenever possible.   Squat down and hold basket close to stand. Use leg muscles to do the work.    Avoid twisting or bending back. Pivot around using foot movements, and bend at knees if needed when reaching for articles.        Getting Into / Out of Bed   Lower self to lie down on one side by raising legs and lowering head at the same time. Use arms to assist moving without twisting. Bend both knees to roll onto back if desired. To sit up, start from lying on side, and use same move-ments in reverse. Keep trunk aligned with legs.    Shift weight from front foot to back foot as item is lifted off shelf.    When leaning forward to pick object up from floor, extend one leg out behind. Keep back straight. Hold onto a sturdy support with other hand.      Sit upright, head facing forward. Try using a roll to support lower back. Keep shoulders relaxed, and avoid rounded back. Keep hips level with knees. Avoid crossing legs for long periods.  Abduction: Clam (Eccentric) - Side-Lying    Lie on side with knees bent. Lift top knee, keeping feet together. Keep trunk steady.  Do 2x10, 2x/day  Knee to Chest (Flexion)    Pull knee toward chest. Feel stretch in lower back or buttock area. Breathing deeply, Hold __20-30__ seconds. Repeat with other knee. Repeat __3__ times. Do __2__ sessions per day.  http://gt2.exer.us/225   Copyright  VHI. All rights reserved.   [facility] [address] [place], [place] [address] Phone  # [phone] Fax [phone]

## 2017-04-26 ENCOUNTER — Ambulatory Visit: Payer: Medicare Other

## 2017-04-26 DIAGNOSIS — M5442 Lumbago with sciatica, left side: Secondary | ICD-10-CM | POA: Diagnosis not present

## 2017-04-26 DIAGNOSIS — M6281 Muscle weakness (generalized): Secondary | ICD-10-CM

## 2017-04-26 DIAGNOSIS — M25651 Stiffness of right hip, not elsewhere classified: Secondary | ICD-10-CM

## 2017-04-26 DIAGNOSIS — G8929 Other chronic pain: Secondary | ICD-10-CM

## 2017-04-26 DIAGNOSIS — M25652 Stiffness of left hip, not elsewhere classified: Secondary | ICD-10-CM | POA: Diagnosis not present

## 2017-04-26 NOTE — Patient Instructions (Addendum)
KNEE: Extension, Long Arc Quad (Weight)  Place weight around leg. Raise leg until knee is straight. Hold _5__ seconds. Use ___ lb weight. _10__ reps per set (each leg), 4-5__ sets per day, __7_ days per week  Copyright  VHI. All rights reserved.    Knee Raise   Lift knee and then lower it. Repeat with other knee. Repeat _10__ times each leg. Do _4-5___ sessions per day.       [facility] [address] [place], [place] [address] Phone # [phone] Fax [phone]

## 2017-04-26 NOTE — Therapy (Signed)
[facility] [address], [place], [place], [address] Phone: [phone]   Fax:  [phone]  Physical Therapy Treatment  Patient Details  Name: Douglas Carpenter MRN: [id number] Date of Birth: 04/21/1956 Referring Provider: Dr. Jovita Gamma  Encounter Date: 04/26/2017      PT End of Session - 04/26/17 1227    Visit Number 3   Number of Visits 10   Date for PT Re-Evaluation 05/31/17   Authorization Type Medicare/tricare; No PTA;    PT Start Time 1146   PT Stop Time 1227   PT Time Calculation (min) 41 min   Activity Tolerance Patient tolerated treatment well   Behavior During Therapy WFL for tasks assessed/performed      Past Medical History:  Diagnosis Date  . Arthritis    "everywhere"  . BPH (benign prostatic hypertrophy)   . Chronic rhinitis 01/27/2009  . ED (erectile dysfunction)   . GERD (gastroesophageal reflux disease)   . Headache(784.0)    migraines, as many as 3 in a week    . HYPERLIPIDEMIA 01/27/2009  . HYPERTENSION 01/27/2009  . Mild asthma    seasonal allergies, uses Proair once per yr.   . Mild obstructive sleep apnea    per pt -- mild osa test result--  no rx cpap needed  . Testosterone deficiency   . Urethral stricture   . Vocal cord anomaly    pt states told from [place]  doctor heard a little gurgle sound -- no farther testing done    Past Surgical History:  Procedure Laterality Date  . CYSTOSCOPY WITH URETHRAL DILATATION N/A 01/07/2013   Procedure: CYSTOSCOPY WITH URETHRAL DILATATION BALLOON DILATION ;  Surgeon: Bernestine Amass, MD;  Location: [facility];  Service: Urology;  Laterality: N/A;  . LUMBAR LAMINECTOMY/DECOMPRESSION MICRODISCECTOMY Right 11/29/2012   Procedure: Right Lumbar Four to Five Laminectomy/ Decompression Microdiskectomy;  Surgeon: Otilio Connors, MD;  Location: [facility];  Service: Neurosurgery;  Laterality: Right;  LUMBAR LAMINECTOMY/DECOMPRESSION  MICRODISCECTOMY 1 LEVEL  . SHOULDER ARTHROSCOPY W/ ACROMIAL REPAIR Left 08-28-2002    There were no vitals filed for this visit.      Subjective Assessment - 04/26/17 1155    Subjective I haven't been doing my exercises because I was traveling yesterday.     Currently in Pain? Yes   Pain Score 6    Pain Location Back                         [facility] Adult PT Treatment/Exercise - 04/26/17 0001      Lumbar Exercises: Stretches   Active Hamstring Stretch 2 reps;30 seconds   Single Knee to Chest Stretch 3 reps;20 seconds     Lumbar Exercises: Sidelying   Clam 20 reps     Knee/Hip Exercises: Stretches   Other Knee/Hip Stretches butterfly stretch 2x30 seconds     Knee/Hip Exercises: Aerobic   Nustep Level 2x 6 minutes   PT present to discuss progress     Knee/Hip Exercises: Seated   Long Arc Quad Both;2 sets;10 reps   Marching Strengthening;Both;2 sets;10 reps                PT Education - 04/26/17 1217    Education provided Yes   Education Details seated leg strength   Person(s) Educated Patient   Methods Explanation;Demonstration;Handout   Comprehension Verbalized understanding;Returned demonstration          PT  Short Term Goals - 04/24/17 1450      PT SHORT TERM GOAL #4   Title understand correct body mechanics with daily activities to assist in pain management and improve function   Baseline further training needed   Time 4   Period Weeks   Status On-going           PT Long Term Goals - 04/19/17 1631      PT LONG TERM GOAL #1   Title independent with HEP and understand how to progress himself   Time 8   Period Weeks   Status New   Target Date 06/14/17     PT LONG TERM GOAL #2   Title increased bilateral hip strength >/= 4/5 so he is able to perform daily activities with greater ease    Time 8   Period Weeks   Status New   Target Date 06/14/17     PT LONG TERM GOAL #3   Title FOTO score </= 46% limitation   Time 8    Period Weeks   Status New   Target Date 06/14/17     PT LONG TERM GOAL #4   Title improved hip rotation and flexion  due to increased flexibility so he is able to get in and out of the car with >/= 50% greater ease   Time 8   Period Weeks   Status New   Target Date 06/14/17               Plan - 04/26/17 1207    Clinical Impression Statement Pt has not been consistent with HEP due to being busy.  Pt will be traveling to [place] this week and is not sure when he will return.  PT advanced pt's HEP for him to perform while he is away.  Pt with chronic LBP, hip stiffness and weakness.  Pt will continue to benefit from skilled PT after he returns from his trip to address LBP.     Clinical Impairments Affecting Rehab Potential 3 back surgeries; diabete; left shoulder surgery; no lifting more than 20#   PT Frequency 2x / week   PT Duration 6 weeks   PT Treatment/Interventions Cryotherapy;Electrical Stimulation;Moist Heat;Ultrasound;Therapeutic activities;Therapeutic exercise;Balance training;Neuromuscular re-education;Patient/family education;Manual techniques;Dry needling;Energy conservation   PT Next Visit Plan hip stretches; joint mobilization to bilateral hips; hip strength; core strength   Consulted and Agree with Plan of Care Patient      Patient will benefit from skilled therapeutic intervention in order to improve the following deficits and impairments:  Decreased range of motion, Difficulty walking, Increased fascial restricitons, Decreased endurance, Decreased activity tolerance, Pain, Increased muscle spasms, Decreased strength, Decreased mobility  Visit Diagnosis: Muscle weakness (generalized)  Chronic right-sided low back pain with left-sided sciatica  Stiffness of right hip, not elsewhere classified  Stiffness of left hip, not elsewhere classified     Problem List Patient Active Problem List   Diagnosis Date Noted  . Exercise-induced asthma 07/21/2014  .  Chronic cough 07/21/2014  . Upper airway cough syndrome 07/21/2014  . Allergic rhinitis 07/21/2014  . Lumbar stenosis 11/06/2013  . Urethral stricture 01/07/2013  . Prostatic hypertrophy 11/12/2012  . VOCAL CORD DISORDER 02/19/2009  . HYPERLIPIDEMIA 01/27/2009  . Snoring 01/27/2009  . HYPERTENSION 01/27/2009  . CHRONIC RHINITIS 01/27/2009  . Asthma 01/27/2009  . GERD 01/27/2009    Sigurd Sos, PT 04/26/17 12:30 PM  [facility] [address], [place], [place], [address] Phone:  [phone]   Fax:  [phone]  Name: KHOLTON COATE MRN: [id number] Date of Birth: Dec 20, 1955

## 2017-05-01 ENCOUNTER — Encounter: Payer: Self-pay | Admitting: *Deleted

## 2017-06-07 ENCOUNTER — Ambulatory Visit: Payer: Medicare Other | Attending: Neurosurgery

## 2017-06-07 DIAGNOSIS — M6281 Muscle weakness (generalized): Secondary | ICD-10-CM

## 2017-06-07 DIAGNOSIS — G8929 Other chronic pain: Secondary | ICD-10-CM

## 2017-06-07 DIAGNOSIS — M5442 Lumbago with sciatica, left side: Secondary | ICD-10-CM | POA: Diagnosis not present

## 2017-06-07 DIAGNOSIS — M25652 Stiffness of left hip, not elsewhere classified: Secondary | ICD-10-CM | POA: Diagnosis not present

## 2017-06-07 DIAGNOSIS — M25651 Stiffness of right hip, not elsewhere classified: Secondary | ICD-10-CM | POA: Diagnosis not present

## 2017-06-07 NOTE — Therapy (Signed)
[facility] [address] [place], [place], [address] Phone: [phone]   Fax:  [phone]  Physical Therapy Treatment  Patient Details  Name: Douglas Carpenter MRN: [id number] Date of Birth: 1956-01-16 Referring Provider: Dr. Jovita Gamma   Encounter Date: 06/07/2017  PT End of Session - 06/07/17 1231    Visit Number  4    Number of Visits  10    Date for PT Re-Evaluation  05/31/17    Authorization Type  Medicare/tricare; No PTA;     PT Start Time  1150    PT Stop Time  1232    PT Time Calculation (min)  42 min    Activity Tolerance  Patient tolerated treatment well    Behavior During Therapy  WFL for tasks assessed/performed       Past Medical History:  Diagnosis Date  . Arthritis    "everywhere"  . BPH (benign prostatic hypertrophy)   . Chronic rhinitis 01/27/2009  . ED (erectile dysfunction)   . GERD (gastroesophageal reflux disease)   . Headache(784.0)    migraines, as many as 3 in a week    . HYPERLIPIDEMIA 01/27/2009  . HYPERTENSION 01/27/2009  . Mild asthma    seasonal allergies, uses Proair once per yr.   . Mild obstructive sleep apnea    per pt -- mild osa test result--  no rx cpap needed  . Testosterone deficiency   . Urethral stricture   . Vocal cord anomaly    pt states told from [place]  doctor heard a little gurgle sound -- no farther testing done    Past Surgical History:  Procedure Laterality Date  . CYSTOSCOPY WITH URETHRAL DILATATION N/A 01/07/2013   Procedure: CYSTOSCOPY WITH URETHRAL DILATATION BALLOON DILATION ;  Surgeon: Bernestine Amass, MD;  Location: [facility];  Service: Urology;  Laterality: N/A;  . LUMBAR LAMINECTOMY/DECOMPRESSION MICRODISCECTOMY Right 11/29/2012   Procedure: Right Lumbar Four to Five Laminectomy/ Decompression Microdiskectomy;  Surgeon: Otilio Connors, MD;  Location: [facility];  Service: Neurosurgery;  Laterality: Right;  LUMBAR  LAMINECTOMY/DECOMPRESSION MICRODISCECTOMY 1 LEVEL  . SHOULDER ARTHROSCOPY W/ ACROMIAL REPAIR Left 08-28-2002    There were no vitals filed for this visit.  Subjective Assessment - 06/07/17 1153    Subjective  Pt has been traveling due to a death in his family.      Currently in Pain?  Yes    Pain Score  5     Pain Location  Back    Pain Orientation  Lower;Left;Right    Pain Descriptors / Indicators  Aching    Pain Type  Chronic pain    Pain Onset  More than a month ago    Pain Frequency  Intermittent    Aggravating Factors   it is just there    Pain Relieving Factors  nothing, sometimes heat         [facility] PT Assessment - 06/07/17 0001      Assessment   Medical Diagnosis  M47.26 other spondylosis with radiculopathy, lumbar region    Onset Date/Surgical Date  10/18/16      Precautions   Precautions  Back    Precaution Comments  no lifting over 25#      Cognition   Overall Cognitive Status  Within Functional Limits for tasks assessed      Observation/Other Assessments   Focus on Therapeutic Outcomes (FOTO)   57% limitation      Posture/Postural Control  Posture/Postural Control  No significant limitations      ROM / Strength   AROM / PROM / Strength  Strength      Strength   Overall Strength  Deficits    Strength Assessment Site  Hip    Right/Left Hip  Left;Right    Right Hip Flexion  3+/5    Right Hip Extension  4/5    Right Hip ABduction  4/5    Left Hip Flexion  3-/5    Left Hip Extension  4-/5    Left Hip ABduction  4/5    Right Knee Extension  4/5    Left Knee Extension  4/5                  [facility] Adult PT Treatment/Exercise - 06/07/17 0001      Lumbar Exercises: Stretches   Active Hamstring Stretch  2 reps;30 seconds    Single Knee to Chest Stretch  3 reps;20 seconds      Lumbar Exercises: Sidelying   Clam  20 reps added red band      Knee/Hip Exercises: Aerobic   Nustep  Level 2x 8 minutes  PT present to discuss progress       Knee/Hip Exercises: Standing   Hip Abduction  Stengthening;Left;Both;2 sets;10 reps      Knee/Hip Exercises: Seated   Long Arc Quad  Both;2 sets;10 reps;Weights    Long Arc Quad Weight  2 lbs.    Marching  Strengthening;Both;2 sets;10 reps;Weights    Marching Weights  2 lbs.               PT Short Term Goals - 06/07/17 1154      PT SHORT TERM GOAL #1   Title  independent with initial HEP    Time  4    Period  Weeks    Status  On-going    Target Date  07/05/17      PT SHORT TERM GOAL #2   Title  bilateral hip flexion increased by 5-10 degrees so he is able to sit with greater ease and less numbness in right lateral thigh    Time  4    Period  Weeks    Status  On-going    Target Date  07/05/17      PT SHORT TERM GOAL #3   Title  ability to brace his abdominals correctly so he is able to perform transitional movements ie, sit to stand, in and out of car and in and out of bed, with moderate pain    Status  Achieved      PT SHORT TERM GOAL #4   Title  understand correct body mechanics with daily activities to assist in pain management and improve function    Status  Achieved        PT Long Term Goals - 06/07/17 1154      PT LONG TERM GOAL #1   Title  independent with HEP and understand how to progress himself    Time  8    Period  Weeks    Status  On-going    Target Date  08/02/17      PT LONG TERM GOAL #2   Title  increased bilateral hip strength >/= 4/5 so he is able to perform daily activities with greater ease     Time  8    Period  Weeks    Status  On-going    Target Date  08/02/17  PT LONG TERM GOAL #3   Title  FOTO score </= 46% limitation    Baseline  57% limitation    Time  8    Period  Weeks    Status  On-going    Target Date  08/02/17      PT LONG TERM GOAL #4   Title  improved hip rotation and flexion  due to increased flexibility so he is able to get in and out of the car with >/= 50% greater ease    Time  8    Period  Weeks     Status  On-going    Target Date  08/02/17            Plan - 06/07/17 1210    Clinical Impression Statement  Pt had lapse in treatment due to travel.  Pt was reassessed today.  Pt has not been performing his HEP regularly.  Pt demonstrates bil hip weakness, core weakness and limited hip flexibility.  Pt has not met goals set at evaluation due to limited attendance.  Pt will resume and continue PT with emphasis on LE strength, core strength and hip flexibility to improve mobility and reduce pain.      Rehab Potential  Excellent    Clinical Impairments Affecting Rehab Potential  3 back surgeries; diabete; left shoulder surgery; no lifting more than 20#    PT Frequency  2x / week    PT Duration  8 weeks    PT Treatment/Interventions  Cryotherapy;Electrical Stimulation;Moist Heat;Ultrasound;Therapeutic activities;Therapeutic exercise;Balance training;Neuromuscular re-education;Patient/family education;Manual techniques;Dry needling;Energy conservation    PT Next Visit Plan  hip stretches; joint mobilization to bilateral hips; hip strength; core strength    Recommended Other Services  initial certification signed, recert sent 38/32/91    Consulted and Agree with Plan of Care  Patient       Patient will benefit from skilled therapeutic intervention in order to improve the following deficits and impairments:  Decreased range of motion, Difficulty walking, Increased fascial restricitons, Decreased endurance, Decreased activity tolerance, Pain, Increased muscle spasms, Decreased strength, Decreased mobility  Visit Diagnosis: Muscle weakness (generalized) - Plan: PT plan of care cert/re-cert  Chronic right-sided low back pain with left-sided sciatica - Plan: PT plan of care cert/re-cert  Stiffness of right hip, not elsewhere classified - Plan: PT plan of care cert/re-cert  Stiffness of left hip, not elsewhere classified - Plan: PT plan of care cert/re-cert     Problem List Patient Active  Problem List   Diagnosis Date Noted  . Exercise-induced asthma 07/21/2014  . Chronic cough 07/21/2014  . Upper airway cough syndrome 07/21/2014  . Allergic rhinitis 07/21/2014  . Lumbar stenosis 11/06/2013  . Urethral stricture 01/07/2013  . Prostatic hypertrophy 11/12/2012  . VOCAL CORD DISORDER 02/19/2009  . HYPERLIPIDEMIA 01/27/2009  . Snoring 01/27/2009  . HYPERTENSION 01/27/2009  . CHRONIC RHINITIS 01/27/2009  . Asthma 01/27/2009  . GERD 01/27/2009     Sigurd Sos, PT 06/07/17 12:33 PM  [facility] [address], [place], [place], [address] Phone: [phone]   Fax:  [phone]  Name: DERWARD MARPLE MRN: [id number] Date of Birth: 09/02/1955

## 2017-06-09 ENCOUNTER — Ambulatory Visit: Payer: Medicare Other | Admitting: Physical Therapy

## 2017-06-09 DIAGNOSIS — M25652 Stiffness of left hip, not elsewhere classified: Secondary | ICD-10-CM

## 2017-06-09 DIAGNOSIS — M6281 Muscle weakness (generalized): Secondary | ICD-10-CM | POA: Diagnosis not present

## 2017-06-09 DIAGNOSIS — G8929 Other chronic pain: Secondary | ICD-10-CM

## 2017-06-09 DIAGNOSIS — M25651 Stiffness of right hip, not elsewhere classified: Secondary | ICD-10-CM

## 2017-06-09 DIAGNOSIS — M5442 Lumbago with sciatica, left side: Secondary | ICD-10-CM

## 2017-06-09 NOTE — Therapy (Signed)
[facility] [address], [place], [place], [address] Phone: [phone]   Fax:  [phone]  Physical Therapy Treatment  Patient Details  Name: Douglas Carpenter MRN: [id number] Date of Birth: Aug 21, 1955 Referring Provider: Dr. Jovita Gamma   Encounter Date: 06/09/2017  PT End of Session - 06/09/17 1053    Visit Number  5    Number of Visits  10    Authorization Type  Medicare/tricare; No PTA;     PT Start Time  1013    PT Stop Time  1055    PT Time Calculation (min)  42 min    Activity Tolerance  Patient tolerated treatment well       Past Medical History:  Diagnosis Date  . Arthritis    "everywhere"  . BPH (benign prostatic hypertrophy)   . Chronic rhinitis 01/27/2009  . ED (erectile dysfunction)   . GERD (gastroesophageal reflux disease)   . Headache(784.0)    migraines, as many as 3 in a week    . HYPERLIPIDEMIA 01/27/2009  . HYPERTENSION 01/27/2009  . Mild asthma    seasonal allergies, uses Proair once per yr.   . Mild obstructive sleep apnea    per pt -- mild osa test result--  no rx cpap needed  . Testosterone deficiency   . Urethral stricture   . Vocal cord anomaly    pt states told from [place]  doctor heard a little gurgle sound -- no farther testing done    Past Surgical History:  Procedure Laterality Date  . CYSTOSCOPY WITH URETHRAL DILATATION N/A 01/07/2013   Procedure: CYSTOSCOPY WITH URETHRAL DILATATION BALLOON DILATION ;  Surgeon: Bernestine Amass, MD;  Location: [facility];  Service: Urology;  Laterality: N/A;  . LUMBAR LAMINECTOMY/DECOMPRESSION MICRODISCECTOMY Right 11/29/2012   Procedure: Right Lumbar Four to Five Laminectomy/ Decompression Microdiskectomy;  Surgeon: Otilio Connors, MD;  Location: [place] [facility];  Service: Neurosurgery;  Laterality: Right;  LUMBAR LAMINECTOMY/DECOMPRESSION MICRODISCECTOMY 1 LEVEL  . SHOULDER ARTHROSCOPY W/ ACROMIAL REPAIR Left 08-28-2002    There  were no vitals filed for this visit.  Subjective Assessment - 06/09/17 1013    Subjective  My back is hurting.  New pain between the shoulder blades in the past week.  Hurts with twisting or with deep breath.      Currently in Pain?  Yes    Pain Score  6     Pain Location  Back    Aggravating Factors   twisting; deep breath; bending over    Pain Relieving Factors  stretching by reaching for the sky; heat                      [facility] Adult PT Treatment/Exercise - 06/09/17 0001      Neuro Re-ed    Neuro Re-ed Details   transverse abdominus activation      Lumbar Exercises: Stretches   Active Hamstring Stretch  5 reps;20 seconds on 2nd step    Hip Flexor Stretch  5 reps doorway stretches with UE movements    Quadruped Mid Back Stretch  5 reps      Lumbar Exercises: Standing   Row  Strengthening;10 reps;Theraband    Theraband Level (Row)  Level 2 (Red)    Other Standing Lumbar Exercises  red band extensions with red band 10x    Other Standing Lumbar Exercises  wall planks 15x      Knee/Hip Exercises: Aerobic  Nustep  L2 10 min      Knee/Hip Exercises: Standing   Hip Extension  Stengthening;Right;Left;10 reps;Knee straight    Extension Limitations  red band to neutral      Manual Therapy   Soft tissue mobilization  Instrument assisted soft tissue in prone to thoracolumbar tissue               PT Short Term Goals - 06/07/17 1154      PT SHORT TERM GOAL #1   Title  independent with initial HEP    Time  4    Period  Weeks    Status  On-going    Target Date  07/05/17      PT SHORT TERM GOAL #2   Title  bilateral hip flexion increased by 5-10 degrees so he is able to sit with greater ease and less numbness in right lateral thigh    Time  4    Period  Weeks    Status  On-going    Target Date  07/05/17      PT SHORT TERM GOAL #3   Title  ability to brace his abdominals correctly so he is able to perform transitional movements ie, sit to stand, in and  out of car and in and out of bed, with moderate pain    Status  Achieved      PT SHORT TERM GOAL #4   Title  understand correct body mechanics with daily activities to assist in pain management and improve function    Status  Achieved        PT Long Term Goals - 06/07/17 1154      PT LONG TERM GOAL #1   Title  independent with HEP and understand how to progress himself    Time  8    Period  Weeks    Status  On-going    Target Date  08/02/17      PT LONG TERM GOAL #2   Title  increased bilateral hip strength >/= 4/5 so he is able to perform daily activities with greater ease     Time  8    Period  Weeks    Status  On-going    Target Date  08/02/17      PT LONG TERM GOAL #3   Title  FOTO score </= 46% limitation    Baseline  57% limitation    Time  8    Period  Weeks    Status  On-going    Target Date  08/02/17      PT LONG TERM GOAL #4   Title  improved hip rotation and flexion  due to increased flexibility so he is able to get in and out of the car with >/= 50% greater ease    Time  8    Period  Weeks    Status  On-going    Target Date  08/02/17            Plan - 06/09/17 1053    Clinical Impression Statement  The patient is able to perform a progression of mobility and core strengthening exercise without an exacerbation of pain.  Verbal cues for transverse abdominus activation.  He declines the need for pain relieving modalities after ex.  States he feels better after treatment session.       Rehab Potential  Excellent    Clinical Impairments Affecting Rehab Potential  3 back surgeries; diabete; left shoulder surgery; no lifting more than  20#    PT Frequency  2x / week    PT Duration  8 weeks    PT Treatment/Interventions  Cryotherapy;Electrical Stimulation;Moist Heat;Ultrasound;Therapeutic activities;Therapeutic exercise;Balance training;Neuromuscular re-education;Patient/family education;Manual techniques;Dry needling;Energy conservation    PT Next Visit Plan   hip stretches; joint mobilization to bilateral hips; hip strength; core strength       Patient will benefit from skilled therapeutic intervention in order to improve the following deficits and impairments:  Decreased range of motion, Difficulty walking, Increased fascial restricitons, Decreased endurance, Decreased activity tolerance, Pain, Increased muscle spasms, Decreased strength, Decreased mobility  Visit Diagnosis: Muscle weakness (generalized)  Chronic right-sided low back pain with left-sided sciatica  Stiffness of right hip, not elsewhere classified  Stiffness of left hip, not elsewhere classified     Problem List Patient Active Problem List   Diagnosis Date Noted  . Exercise-induced asthma 07/21/2014  . Chronic cough 07/21/2014  . Upper airway cough syndrome 07/21/2014  . Allergic rhinitis 07/21/2014  . Lumbar stenosis 11/06/2013  . Urethral stricture 01/07/2013  . Prostatic hypertrophy 11/12/2012  . VOCAL CORD DISORDER 02/19/2009  . HYPERLIPIDEMIA 01/27/2009  . Snoring 01/27/2009  . HYPERTENSION 01/27/2009  . CHRONIC RHINITIS 01/27/2009  . Asthma 01/27/2009  . GERD 01/27/2009   Ruben Im, PT 06/09/17 11:51 AM Phone: [phone] Fax: [phone]  Alvera Singh 06/09/2017, 11:50 AM  [facility] [address], [place], [place], [address] Phone: [phone]   Fax:  [phone]  Name: TRAYVON TRUMBULL MRN: [id number] Date of Birth: 1955/09/20

## 2017-06-12 ENCOUNTER — Ambulatory Visit: Payer: Medicare Other | Admitting: Physical Therapy

## 2017-06-12 DIAGNOSIS — M6281 Muscle weakness (generalized): Secondary | ICD-10-CM

## 2017-06-12 DIAGNOSIS — M25652 Stiffness of left hip, not elsewhere classified: Secondary | ICD-10-CM

## 2017-06-12 DIAGNOSIS — G8929 Other chronic pain: Secondary | ICD-10-CM | POA: Diagnosis not present

## 2017-06-12 DIAGNOSIS — M25651 Stiffness of right hip, not elsewhere classified: Secondary | ICD-10-CM | POA: Diagnosis not present

## 2017-06-12 DIAGNOSIS — M5442 Lumbago with sciatica, left side: Secondary | ICD-10-CM | POA: Diagnosis not present

## 2017-06-12 NOTE — Therapy (Signed)
[facility] [address], [place], [place], [address] Phone: [phone]   Fax:  [phone]  Physical Therapy Treatment  Patient Details  Name: Douglas Carpenter MRN: [id number] Date of Birth: 20-Dec-1955 Referring Provider: Dr. Jovita Gamma   Encounter Date: 06/12/2017  PT End of Session - 06/12/17 1203    Visit Number  6    Number of Visits  10    Authorization Type  Medicare/tricare; No PTA;     PT Start Time  1145    PT Stop Time  1230    PT Time Calculation (min)  45 min    Activity Tolerance  Patient tolerated treatment well;No increased pain    Behavior During Therapy  WFL for tasks assessed/performed       Past Medical History:  Diagnosis Date  . Arthritis    "everywhere"  . BPH (benign prostatic hypertrophy)   . Chronic rhinitis 01/27/2009  . ED (erectile dysfunction)   . GERD (gastroesophageal reflux disease)   . Headache(784.0)    migraines, as many as 3 in a week    . HYPERLIPIDEMIA 01/27/2009  . HYPERTENSION 01/27/2009  . Mild asthma    seasonal allergies, uses Proair once per yr.   . Mild obstructive sleep apnea    per pt -- mild osa test result--  no rx cpap needed  . Testosterone deficiency   . Urethral stricture   . Vocal cord anomaly    pt states told from [place]  doctor heard a little gurgle sound -- no farther testing done    Past Surgical History:  Procedure Laterality Date  . CYSTOSCOPY WITH URETHRAL DILATATION N/A 01/07/2013   Procedure: CYSTOSCOPY WITH URETHRAL DILATATION BALLOON DILATION ;  Surgeon: Bernestine Amass, MD;  Location: [facility];  Service: Urology;  Laterality: N/A;  . LUMBAR LAMINECTOMY/DECOMPRESSION MICRODISCECTOMY Right 11/29/2012   Procedure: Right Lumbar Four to Five Laminectomy/ Decompression Microdiskectomy;  Surgeon: Otilio Connors, MD;  Location: [place] [facility];  Service: Neurosurgery;  Laterality: Right;  LUMBAR LAMINECTOMY/DECOMPRESSION MICRODISCECTOMY 1  LEVEL  . SHOULDER ARTHROSCOPY W/ ACROMIAL REPAIR Left 08-28-2002    There were no vitals filed for this visit.  Subjective Assessment - 06/12/17 1148    Subjective  Pt reports that the area between his shoulder blades is much improved. His low back is the part that bothers him mostly today.     Currently in Pain?  Yes    Pain Score  5     Pain Location  Back    Pain Orientation  Right;Left    Pain Descriptors / Indicators  Aching    Pain Type  Chronic pain    Pain Radiating Towards  symptoms running down the back     Pain Onset  More than a month ago    Pain Frequency  Intermittent    Aggravating Factors   no specific causes     Pain Relieving Factors  stretching    Effect of Pain on Daily Activities  moderate     Multiple Pain Sites  No                      [facility] Adult PT Treatment/Exercise - 06/12/17 0001      Lumbar Exercises: Stretches   Hip Flexor Stretch  3 reps;30 seconds;Other (comment) supine thomas position    Piriformis Stretch  3 reps;30 seconds      Lumbar Exercises: Supine   Ab  Set  10 reps;3 seconds    Bent Knee Raise  15 reps;Other (comment) with abdominal bracing     Bridge  15 reps small ranges of hip extension     Other Supine Lumbar Exercises  sciatic nerve glide x15 reps     Other Supine Lumbar Exercises  hip extension isometric x3 sec hold, x10 reps each       Lumbar Exercises: Quadruped   Madcat/Old Horse  15 reps      Knee/Hip Exercises: Aerobic   Nustep  L3 x6 min             PT Education - 06/12/17 1202    Education provided  Yes    Education Details  technique with therex     Person(s) Educated  Patient    Methods  Explanation;Verbal cues    Comprehension  Verbalized understanding;Returned demonstration       PT Short Term Goals - 06/07/17 1154      PT SHORT TERM GOAL #1   Title  independent with initial HEP    Time  4    Period  Weeks    Status  On-going    Target Date  07/05/17      PT SHORT TERM GOAL #2    Title  bilateral hip flexion increased by 5-10 degrees so he is able to sit with greater ease and less numbness in right lateral thigh    Time  4    Period  Weeks    Status  On-going    Target Date  07/05/17      PT SHORT TERM GOAL #3   Title  ability to brace his abdominals correctly so he is able to perform transitional movements ie, sit to stand, in and out of car and in and out of bed, with moderate pain    Status  Achieved      PT SHORT TERM GOAL #4   Title  understand correct body mechanics with daily activities to assist in pain management and improve function    Status  Achieved        PT Long Term Goals - 06/07/17 1154      PT LONG TERM GOAL #1   Title  independent with HEP and understand how to progress himself    Time  8    Period  Weeks    Status  On-going    Target Date  08/02/17      PT LONG TERM GOAL #2   Title  increased bilateral hip strength >/= 4/5 so he is able to perform daily activities with greater ease     Time  8    Period  Weeks    Status  On-going    Target Date  08/02/17      PT LONG TERM GOAL #3   Title  FOTO score </= 46% limitation    Baseline  57% limitation    Time  8    Period  Weeks    Status  On-going    Target Date  08/02/17      PT LONG TERM GOAL #4   Title  improved hip rotation and flexion  due to increased flexibility so he is able to get in and out of the car with >/= 50% greater ease    Time  8    Period  Weeks    Status  On-going    Target Date  08/02/17  Plan - 06/12/17 1219    Clinical Impression Statement  Pt reports that things are going well. He feels overall 10% improved from the start of therapy. His mid thoracic pain has resolved, although he continues to have low back pain with prolonged activity. Session focused on therex to improve hip flexibility/strength and deep abdominal activation. Pt was able to complete all of today's exercises without increase in low back pain, although he did require  intermittent cues to improve technique. Will continue with current POC.     Rehab Potential  Excellent    Clinical Impairments Affecting Rehab Potential  3 back surgeries; diabete; left shoulder surgery; no lifting more than 20#    PT Frequency  2x / week    PT Duration  8 weeks    PT Treatment/Interventions  Cryotherapy;Electrical Stimulation;Moist Heat;Ultrasound;Therapeutic activities;Therapeutic exercise;Balance training;Neuromuscular re-education;Patient/family education;Manual techniques;Dry needling;Energy conservation    PT Next Visit Plan  anterior femoral cutaneous nerve glide; hip stretches; joint mobilization to bilateral hips; hip strength; core strength    Consulted and Agree with Plan of Care  Patient       Patient will benefit from skilled therapeutic intervention in order to improve the following deficits and impairments:  Decreased range of motion, Difficulty walking, Increased fascial restricitons, Decreased endurance, Decreased activity tolerance, Pain, Increased muscle spasms, Decreased strength, Decreased mobility  Visit Diagnosis: Muscle weakness (generalized)  Chronic right-sided low back pain with left-sided sciatica  Stiffness of right hip, not elsewhere classified  Stiffness of left hip, not elsewhere classified     Problem List Patient Active Problem List   Diagnosis Date Noted  . Exercise-induced asthma 07/21/2014  . Chronic cough 07/21/2014  . Upper airway cough syndrome 07/21/2014  . Allergic rhinitis 07/21/2014  . Lumbar stenosis 11/06/2013  . Urethral stricture 01/07/2013  . Prostatic hypertrophy 11/12/2012  . VOCAL CORD DISORDER 02/19/2009  . HYPERLIPIDEMIA 01/27/2009  . Snoring 01/27/2009  . HYPERTENSION 01/27/2009  . CHRONIC RHINITIS 01/27/2009  . Asthma 01/27/2009  . GERD 01/27/2009    12:28 PM,06/12/17 Elly Modena PT, DPT [place] [facility] [address], [place], [place], [address] Phone: [phone]   Fax:  [phone]  Name: Douglas Carpenter MRN: [id number] Date of Birth: 09-Sep-1955

## 2017-06-14 ENCOUNTER — Ambulatory Visit: Payer: Medicare Other | Admitting: Physical Therapy

## 2017-06-14 DIAGNOSIS — M25651 Stiffness of right hip, not elsewhere classified: Secondary | ICD-10-CM | POA: Diagnosis not present

## 2017-06-14 DIAGNOSIS — G8929 Other chronic pain: Secondary | ICD-10-CM

## 2017-06-14 DIAGNOSIS — M25652 Stiffness of left hip, not elsewhere classified: Secondary | ICD-10-CM

## 2017-06-14 DIAGNOSIS — M5442 Lumbago with sciatica, left side: Secondary | ICD-10-CM | POA: Diagnosis not present

## 2017-06-14 DIAGNOSIS — M6281 Muscle weakness (generalized): Secondary | ICD-10-CM

## 2017-06-14 NOTE — Therapy (Signed)
[facility] [address] [place], [place], [address] Phone: [phone]   Fax:  [phone]  Physical Therapy Treatment  Patient Details  Name: Douglas Carpenter MRN: [id number] Date of Birth: 19-Oct-1955 Referring Provider: Dr. Jovita Gamma   Encounter Date: 06/14/2017  PT End of Session - 06/14/17 1334    Visit Number  7    Number of Visits  10    Authorization Type  Medicare/tricare; No PTA;     PT Start Time  1430    PT Stop Time  1514    PT Time Calculation (min)  44 min    Activity Tolerance  Patient tolerated treatment well;No increased pain    Behavior During Therapy  WFL for tasks assessed/performed       Past Medical History:  Diagnosis Date  . Arthritis    "everywhere"  . BPH (benign prostatic hypertrophy)   . Chronic rhinitis 01/27/2009  . ED (erectile dysfunction)   . GERD (gastroesophageal reflux disease)   . Headache(784.0)    migraines, as many as 3 in a week    . HYPERLIPIDEMIA 01/27/2009  . HYPERTENSION 01/27/2009  . Mild asthma    seasonal allergies, uses Proair once per yr.   . Mild obstructive sleep apnea    per pt -- mild osa test result--  no rx cpap needed  . Testosterone deficiency   . Urethral stricture   . Vocal cord anomaly    pt states told from [place]  doctor heard a little gurgle sound -- no farther testing done    Past Surgical History:  Procedure Laterality Date  . CYSTOSCOPY WITH URETHRAL DILATATION N/A 01/07/2013   Procedure: CYSTOSCOPY WITH URETHRAL DILATATION BALLOON DILATION ;  Surgeon: Bernestine Amass, MD;  Location: [facility];  Service: Urology;  Laterality: N/A;  . LUMBAR LAMINECTOMY/DECOMPRESSION MICRODISCECTOMY Right 11/29/2012   Procedure: Right Lumbar Four to Five Laminectomy/ Decompression Microdiskectomy;  Surgeon: Otilio Connors, MD;  Location: [place] [facility];  Service: Neurosurgery;  Laterality: Right;  LUMBAR LAMINECTOMY/DECOMPRESSION MICRODISCECTOMY 1  LEVEL  . SHOULDER ARTHROSCOPY W/ ACROMIAL REPAIR Left 08-28-2002    There were no vitals filed for this visit.  Subjective Assessment - 06/14/17 1231    Subjective  Pt reports that he was a little sore following his last session in his hamstrings. He continued working on his exercises and stretching. He still has some back pain today.     Currently in Pain?  Yes    Pain Score  5     Pain Location  Back    Pain Orientation  Right;Left    Pain Descriptors / Indicators  Aching    Pain Type  Chronic pain    Pain Radiating Towards  symptoms down the back of the leg     Pain Onset  More than a month ago    Pain Frequency  Intermittent    Aggravating Factors   no specific causes     Pain Relieving Factors  stretching     Effect of Pain on Daily Activities  moderate                       [facility] Adult PT Treatment/Exercise - 06/14/17 0001      Lumbar Exercises: Stretches   Piriformis Stretch  2 reps;30 seconds supine figure 4      Lumbar Exercises: Aerobic   Stationary Bike  L1 x6 min therapist present to discuss  session     Lumbar Exercises: Supine   Ab Set  10 reps;3 seconds    Clam  10 reps;Limitations    Clam Limitations  x2 sets with red TB and abdominal bracing     Large Ball Oblique Isometric  15 reps;3 seconds    Other Supine Lumbar Exercises  hooklying BUE flexion with yellow TB and abdominal brace x15 reps each     Other Supine Lumbar Exercises  sciatic nerve glide x15 reps each       Manual Therapy   Manual Therapy  Joint mobilization    Joint Mobilization  Rt hip IR MWM 3x10 reps, Rt and Lt hip anterior mobilization grade III-IV  Pt reporting improved back pain with over pressure in prone             PT Education - 06/14/17 1333    Education provided  Yes    Education Details  implications for manual techniques     Person(s) Educated  Patient    Methods  Explanation    Comprehension  Verbalized understanding       PT Short Term Goals -  06/07/17 1154      PT SHORT TERM GOAL #1   Title  independent with initial HEP    Time  4    Period  Weeks    Status  On-going    Target Date  07/05/17      PT SHORT TERM GOAL #2   Title  bilateral hip flexion increased by 5-10 degrees so he is able to sit with greater ease and less numbness in right lateral thigh    Time  4    Period  Weeks    Status  On-going    Target Date  07/05/17      PT SHORT TERM GOAL #3   Title  ability to brace his abdominals correctly so he is able to perform transitional movements ie, sit to stand, in and out of car and in and out of bed, with moderate pain    Status  Achieved      PT SHORT TERM GOAL #4   Title  understand correct body mechanics with daily activities to assist in pain management and improve function    Status  Achieved        PT Long Term Goals - 06/07/17 1154      PT LONG TERM GOAL #1   Title  independent with HEP and understand how to progress himself    Time  8    Period  Weeks    Status  On-going    Target Date  08/02/17      PT LONG TERM GOAL #2   Title  increased bilateral hip strength >/= 4/5 so he is able to perform daily activities with greater ease     Time  8    Period  Weeks    Status  On-going    Target Date  08/02/17      PT LONG TERM GOAL #3   Title  FOTO score </= 46% limitation    Baseline  57% limitation    Time  8    Period  Weeks    Status  On-going    Target Date  08/02/17      PT LONG TERM GOAL #4   Title  improved hip rotation and flexion  due to increased flexibility so he is able to get in and out of the car with >/= 50%  greater ease    Time  8    Period  Weeks    Status  On-going    Target Date  08/02/17            Plan - 06/14/17 1336    Clinical Impression Statement  Pt arrived with no increase in pain despite last session's progressions. Continued with focus on deep abdominal strength and endurance with pt able to complete all exercises without significant difficulty. Added  manual techniques end of session to address hip mobility restrictions, pt reporting improved pain with over pressure to the hip and low back in prone position. Will continue with manual techniques in future sessions to address joint and soft tissue restrictions of the spine and hip.    Rehab Potential  Excellent    Clinical Impairments Affecting Rehab Potential  3 back surgeries; diabete; left shoulder surgery; no lifting more than 20#    PT Frequency  2x / week    PT Duration  8 weeks    PT Treatment/Interventions  Cryotherapy;Electrical Stimulation;Moist Heat;Ultrasound;Therapeutic activities;Therapeutic exercise;Balance training;Neuromuscular re-education;Patient/family education;Manual techniques;Dry needling;Energy conservation    PT Next Visit Plan  hip/lumbar mobilizations; hip stretches/strength; core strength    Consulted and Agree with Plan of Care  Patient       Patient will benefit from skilled therapeutic intervention in order to improve the following deficits and impairments:  Decreased range of motion, Difficulty walking, Increased fascial restricitons, Decreased endurance, Decreased activity tolerance, Pain, Increased muscle spasms, Decreased strength, Decreased mobility  Visit Diagnosis: Muscle weakness (generalized)  Chronic right-sided low back pain with left-sided sciatica  Stiffness of right hip, not elsewhere classified  Stiffness of left hip, not elsewhere classified     Problem List Patient Active Problem List   Diagnosis Date Noted  . Exercise-induced asthma 07/21/2014  . Chronic cough 07/21/2014  . Upper airway cough syndrome 07/21/2014  . Allergic rhinitis 07/21/2014  . Lumbar stenosis 11/06/2013  . Urethral stricture 01/07/2013  . Prostatic hypertrophy 11/12/2012  . VOCAL CORD DISORDER 02/19/2009  . HYPERLIPIDEMIA 01/27/2009  . Snoring 01/27/2009  . HYPERTENSION 01/27/2009  . CHRONIC RHINITIS 01/27/2009  . Asthma 01/27/2009  . GERD 01/27/2009     1:40 PM,06/14/17 Elly Modena PT, DPT [place] [facility] [address] [place], [place], [address] Phone: [phone]   Fax:  [phone]  Name: Douglas Carpenter MRN: [id number] Date of Birth: 12-31-1955

## 2017-06-30 ENCOUNTER — Ambulatory Visit: Payer: Medicare PPO | Attending: Neurosurgery | Admitting: Physical Therapy

## 2017-06-30 DIAGNOSIS — M6281 Muscle weakness (generalized): Secondary | ICD-10-CM | POA: Insufficient documentation

## 2017-06-30 DIAGNOSIS — M25651 Stiffness of right hip, not elsewhere classified: Secondary | ICD-10-CM | POA: Diagnosis present

## 2017-06-30 DIAGNOSIS — G8929 Other chronic pain: Secondary | ICD-10-CM | POA: Insufficient documentation

## 2017-06-30 DIAGNOSIS — M25652 Stiffness of left hip, not elsewhere classified: Secondary | ICD-10-CM | POA: Diagnosis present

## 2017-06-30 DIAGNOSIS — M5442 Lumbago with sciatica, left side: Secondary | ICD-10-CM | POA: Diagnosis present

## 2017-06-30 NOTE — Therapy (Signed)
[facility] [address] [place], [place], [address] Phone: [phone]   Fax:  [phone]  Physical Therapy Treatment  Patient Details  Name: Douglas Carpenter MRN: [id number] Date of Birth: Jul 20, 1955 Referring Provider: Dr. Jovita Gamma   Encounter Date: 06/30/2017  PT End of Session - 06/30/17 1220    Visit Number  8    Number of Visits  10    Date for PT Re-Evaluation  08/02/17    Authorization Type  Medicare/tricare; No PTA;     PT Start Time  1015    PT Stop Time  1100    PT Time Calculation (min)  45 min    Activity Tolerance  Patient tolerated treatment well       Past Medical History:  Diagnosis Date  . Arthritis    "everywhere"  . BPH (benign prostatic hypertrophy)   . Chronic rhinitis 01/27/2009  . ED (erectile dysfunction)   . GERD (gastroesophageal reflux disease)   . Headache(784.0)    migraines, as many as 3 in a week    . HYPERLIPIDEMIA 01/27/2009  . HYPERTENSION 01/27/2009  . Mild asthma    seasonal allergies, uses Proair once per yr.   . Mild obstructive sleep apnea    per pt -- mild osa test result--  no rx cpap needed  . Testosterone deficiency   . Urethral stricture   . Vocal cord anomaly    pt states told from [place]  doctor heard a little gurgle sound -- no farther testing done    Past Surgical History:  Procedure Laterality Date  . CYSTOSCOPY WITH URETHRAL DILATATION N/A 01/07/2013   Procedure: CYSTOSCOPY WITH URETHRAL DILATATION BALLOON DILATION ;  Surgeon: Bernestine Amass, MD;  Location: [facility];  Service: Urology;  Laterality: N/A;  . LUMBAR LAMINECTOMY/DECOMPRESSION MICRODISCECTOMY Right 11/29/2012   Procedure: Right Lumbar Four to Five Laminectomy/ Decompression Microdiskectomy;  Surgeon: Otilio Connors, MD;  Location: [place] [facility];  Service: Neurosurgery;  Laterality: Right;  LUMBAR LAMINECTOMY/DECOMPRESSION MICRODISCECTOMY 1 LEVEL  . SHOULDER ARTHROSCOPY W/ ACROMIAL  REPAIR Left 08-28-2002    There were no vitals filed for this visit.  Subjective Assessment - 06/30/17 1018    Subjective  Right thigh still gets numb with standing and left shooting pain into thigh.  Standing at the stove will produce symptoms in 2-3 minutes, relieved with sitting.  Walking is OK.  Sees the doctor.      Currently in Pain?  Yes    Pain Score  6     Pain Type  Chronic pain    Aggravating Factors   standing     Pain Relieving Factors  sitting; manual therapy helped last time                      [facility] Adult PT Treatment/Exercise - 06/30/17 0001      Lumbar Exercises: Standing   Row  Power tower;Both;20 reps    Shoulder Extension  Power Tower;Both 50 reps; abddominal brace, flexed knees    Other Standing Lumbar Exercises  5# ankle weights pendulum swings over the side of a step 1 min each side      Manual Therapy   Joint Mobilization  bil hip mobs grade 3/4 3 bouts of 30 sec:  long axis distraction, inferior, AP in internal rotation, prone in IR and FABER position; prone lumbar PA;  neutral gapping  PT Short Term Goals - 06/07/17 1154      PT SHORT TERM GOAL #1   Title  independent with initial HEP    Time  4    Period  Weeks    Status  On-going    Target Date  07/05/17      PT SHORT TERM GOAL #2   Title  bilateral hip flexion increased by 5-10 degrees so he is able to sit with greater ease and less numbness in right lateral thigh    Time  4    Period  Weeks    Status  On-going    Target Date  07/05/17      PT SHORT TERM GOAL #3   Title  ability to brace his abdominals correctly so he is able to perform transitional movements ie, sit to stand, in and out of car and in and out of bed, with moderate pain    Status  Achieved      PT SHORT TERM GOAL #4   Title  understand correct body mechanics with daily activities to assist in pain management and improve function    Status  Achieved        PT Long Term Goals -  06/07/17 1154      PT LONG TERM GOAL #1   Title  independent with HEP and understand how to progress himself    Time  8    Period  Weeks    Status  On-going    Target Date  08/02/17      PT LONG TERM GOAL #2   Title  increased bilateral hip strength >/= 4/5 so he is able to perform daily activities with greater ease     Time  8    Period  Weeks    Status  On-going    Target Date  08/02/17      PT LONG TERM GOAL #3   Title  FOTO score </= 46% limitation    Baseline  57% limitation    Time  8    Period  Weeks    Status  On-going    Target Date  08/02/17      PT LONG TERM GOAL #4   Title  improved hip rotation and flexion  due to increased flexibility so he is able to get in and out of the car with >/= 50% greater ease    Time  8    Period  Weeks    Status  On-going    Target Date  08/02/17            Plan - 06/30/17 1024    Clinical Impression Statement  The patient reports he continues to have bil hip and thigh pain with standing, relieved with sitting and not present with walking.  He reports good relief with hip mobs especially tractioning techniques.  Check progress toward STGs next visit.      Rehab Potential  Excellent    Clinical Impairments Affecting Rehab Potential  3 back surgeries; diabete; left shoulder surgery; no lifting more than 20#    PT Frequency  2x / week    PT Duration  8 weeks    PT Treatment/Interventions  Cryotherapy;Electrical Stimulation;Moist Heat;Ultrasound;Therapeutic activities;Therapeutic exercise;Balance training;Neuromuscular re-education;Patient/family education;Manual techniques;Dry needling;Energy conservation    PT Next Visit Plan  check STGs;  hip/lumbar mobilizations; hip stretches/strength; core strength; consider traction    Recommended Other Services  recert rerouted 2nd attempt for signature  Patient will benefit from skilled therapeutic intervention in order to improve the following deficits and impairments:  Decreased  range of motion, Difficulty walking, Increased fascial restricitons, Decreased endurance, Decreased activity tolerance, Pain, Increased muscle spasms, Decreased strength, Decreased mobility  Visit Diagnosis: Muscle weakness (generalized)  Chronic right-sided low back pain with left-sided sciatica  Stiffness of right hip, not elsewhere classified  Stiffness of left hip, not elsewhere classified     Problem List Patient Active Problem List   Diagnosis Date Noted  . Exercise-induced asthma 07/21/2014  . Chronic cough 07/21/2014  . Upper airway cough syndrome 07/21/2014  . Allergic rhinitis 07/21/2014  . Lumbar stenosis 11/06/2013  . Urethral stricture 01/07/2013  . Prostatic hypertrophy 11/12/2012  . VOCAL CORD DISORDER 02/19/2009  . HYPERLIPIDEMIA 01/27/2009  . Snoring 01/27/2009  . HYPERTENSION 01/27/2009  . CHRONIC RHINITIS 01/27/2009  . Asthma 01/27/2009  . GERD 01/27/2009   Ruben Im, PT 06/30/17 12:26 PM Phone: [phone] Fax: [phone]  Alvera Singh 06/30/2017, 12:25 PM  [facility] [address] [place], [place], [address] Phone: [phone]   Fax:  [phone]  Name: SHAMAN MUSCARELLA MRN: [id number] Date of Birth: 02-20-1956

## 2017-07-04 ENCOUNTER — Encounter: Payer: Self-pay | Admitting: Physical Therapy

## 2017-07-04 ENCOUNTER — Ambulatory Visit: Payer: Medicare PPO | Admitting: Physical Therapy

## 2017-07-04 DIAGNOSIS — M5442 Lumbago with sciatica, left side: Secondary | ICD-10-CM

## 2017-07-04 DIAGNOSIS — G8929 Other chronic pain: Secondary | ICD-10-CM

## 2017-07-04 DIAGNOSIS — M25651 Stiffness of right hip, not elsewhere classified: Secondary | ICD-10-CM

## 2017-07-04 DIAGNOSIS — M25652 Stiffness of left hip, not elsewhere classified: Secondary | ICD-10-CM

## 2017-07-04 DIAGNOSIS — M6281 Muscle weakness (generalized): Secondary | ICD-10-CM | POA: Diagnosis not present

## 2017-07-04 NOTE — Therapy (Signed)
[facility] [address] [place], [place], [address] Phone: [phone]   Fax:  [phone]  Physical Therapy Treatment  Patient Details  Name: Douglas Carpenter MRN: [id number] Date of Birth: 02-03-1956 Referring Provider: Dr. Jovita Gamma   Encounter Date: 07/04/2017  PT End of Session - 07/04/17 1006    Visit Number  9    Number of Visits  10    Date for PT Re-Evaluation  08/02/17    Authorization Type  Medicare/tricare; No PTA;     PT Start Time  0925    PT Stop Time  1015    PT Time Calculation (min)  50 min    Activity Tolerance  Patient tolerated treatment well       Past Medical History:  Diagnosis Date  . Arthritis    "everywhere"  . BPH (benign prostatic hypertrophy)   . Chronic rhinitis 01/27/2009  . ED (erectile dysfunction)   . GERD (gastroesophageal reflux disease)   . Headache(784.0)    migraines, as many as 3 in a week    . HYPERLIPIDEMIA 01/27/2009  . HYPERTENSION 01/27/2009  . Mild asthma    seasonal allergies, uses Proair once per yr.   . Mild obstructive sleep apnea    per pt -- mild osa test result--  no rx cpap needed  . Testosterone deficiency   . Urethral stricture   . Vocal cord anomaly    pt states told from [place]  doctor heard a little gurgle sound -- no farther testing done    Past Surgical History:  Procedure Laterality Date  . CYSTOSCOPY WITH URETHRAL DILATATION N/A 01/07/2013   Procedure: CYSTOSCOPY WITH URETHRAL DILATATION BALLOON DILATION ;  Surgeon: Bernestine Amass, MD;  Location: [facility];  Service: Urology;  Laterality: N/A;  . LUMBAR LAMINECTOMY/DECOMPRESSION MICRODISCECTOMY Right 11/29/2012   Procedure: Right Lumbar Four to Five Laminectomy/ Decompression Microdiskectomy;  Surgeon: Otilio Connors, MD;  Location: [place] [facility];  Service: Neurosurgery;  Laterality: Right;  LUMBAR LAMINECTOMY/DECOMPRESSION MICRODISCECTOMY 1 LEVEL  . SHOULDER ARTHROSCOPY W/ ACROMIAL  REPAIR Left 08-28-2002    There were no vitals filed for this visit.  Subjective Assessment - 07/04/17 0933    Subjective  Went to the RV show and walked 3 hours.  I'm ok.  No changes in left leg symptoms.  Liked the (manual) techniques last time and he taught his wife how to do the leg pull.      Currently in Pain?  Yes    Pain Score  5     Pain Location  Back    Pain Orientation  Right    Pain Type  Chronic pain    Aggravating Factors   standing            Core strength grossly 4/5.        Discussion of HEP and response.     [facility]Exercise - 07/04/17 0001      Lumbar Exercises: Aerobic   Stationary Bike  L1 x6 min therapist present to discuss sessiom      Lumbar Exercises: Standing   Row  Power tower;Both;20 reps    Shoulder Extension  Power Tower;Both 50 reps; abddominal brace, flexed knees      Traction   Type of Traction  Lumbar    Min (lbs)  50    Max (lbs)  80    Hold Time  45    Rest Time  15  Time  15               PT Short Term Goals - 07/04/17 1007      PT SHORT TERM GOAL #1   Title  independent with initial HEP    Status  Achieved      PT SHORT TERM GOAL #2   Title  bilateral hip flexion increased by 5-10 degrees so he is able to sit with greater ease and less numbness in right lateral thigh    Status  Achieved      PT SHORT TERM GOAL #3   Title  ability to brace his abdominals correctly so he is able to perform transitional movements ie, sit to stand, in and out of car and in and out of bed, with moderate pain    Status  Achieved      PT SHORT TERM GOAL #4   Title  understand correct body mechanics with daily activities to assist in pain management and improve function    Status  Achieved        PT Long Term Goals - 06/07/17 1154      PT LONG TERM GOAL #1   Title  independent with HEP and understand how to progress himself    Time  8    Period  Weeks    Status  On-going    Target Date  08/02/17       PT LONG TERM GOAL #2   Title  increased bilateral hip strength >/= 4/5 so he is able to perform daily activities with greater ease     Time  8    Period  Weeks    Status  On-going    Target Date  08/02/17      PT LONG TERM GOAL #3   Title  FOTO score </= 46% limitation    Baseline  57% limitation    Time  8    Period  Weeks    Status  On-going    Target Date  08/02/17      PT LONG TERM GOAL #4   Title  improved hip rotation and flexion  due to increased flexibility so he is able to get in and out of the car with >/= 50% greater ease    Time  8    Period  Weeks    Status  On-going    Target Date  08/02/17            Plan - 07/04/17 1006    Clinical Impression Statement  The patient has met all STGs.  He is able to sit, lie down and walk with minimal symptoms but standing will produce left LE symptoms.  He is interested in trying mechanical traction since he had such good relief with manual distraction techniques.      Rehab Potential  Excellent    Clinical Impairments Affecting Rehab Potential  3 back surgeries; diabete; left shoulder surgery; no lifting more than 20#    PT Frequency  2x / week    PT Treatment/Interventions  Cryotherapy;Electrical Stimulation;Moist Heat;Ultrasound;Therapeutic activities;Therapeutic exercise;Balance training;Neuromuscular re-education;Patient/family education;Manual techniques;Dry needling;Energy conservation    PT Next Visit Plan   Do FOTO;  hip/lumbar mobilizations; hip stretches/strength; core strength; assess response to  traction       Patient will benefit from skilled therapeutic intervention in order to improve the following deficits and impairments:  Decreased range of motion, Difficulty walking, Increased fascial restricitons, Decreased endurance, Decreased activity tolerance, Pain, Increased muscle  spasms, Decreased strength, Decreased mobility  Visit Diagnosis: Muscle weakness (generalized)  Chronic right-sided low back pain  with left-sided sciatica  Stiffness of right hip, not elsewhere classified  Stiffness of left hip, not elsewhere classified     Problem List Patient Active Problem List   Diagnosis Date Noted  . Exercise-induced asthma 07/21/2014  . Chronic cough 07/21/2014  . Upper airway cough syndrome 07/21/2014  . Allergic rhinitis 07/21/2014  . Lumbar stenosis 11/06/2013  . Urethral stricture 01/07/2013  . Prostatic hypertrophy 11/12/2012  . VOCAL CORD DISORDER 02/19/2009  . HYPERLIPIDEMIA 01/27/2009  . Snoring 01/27/2009  . HYPERTENSION 01/27/2009  . CHRONIC RHINITIS 01/27/2009  . Asthma 01/27/2009  . GERD 01/27/2009   Ruben Im, PT 07/04/17 10:19 AM Phone: [phone] Fax: [phone]  Alvera Singh 07/04/2017, 10:18 AM  [facility] [address] [place], [place], [address] Phone: [phone]   Fax:  [phone]  Name: EERO DINI MRN: [id number] Date of Birth: 12/27/1955

## 2017-07-06 ENCOUNTER — Other Ambulatory Visit: Payer: Self-pay

## 2017-07-06 ENCOUNTER — Encounter: Payer: Self-pay | Admitting: Family Medicine

## 2017-07-06 ENCOUNTER — Ambulatory Visit (INDEPENDENT_AMBULATORY_CARE_PROVIDER_SITE_OTHER): Payer: Medicare PPO | Admitting: Family Medicine

## 2017-07-06 VITALS — BP 132/72 | HR 65 | Temp 98.4°F | Resp 16 | Ht 66.93 in | Wt 225.0 lb

## 2017-07-06 DIAGNOSIS — R2232 Localized swelling, mass and lump, left upper limb: Secondary | ICD-10-CM

## 2017-07-06 DIAGNOSIS — Z1211 Encounter for screening for malignant neoplasm of colon: Secondary | ICD-10-CM | POA: Diagnosis not present

## 2017-07-06 DIAGNOSIS — L729 Follicular cyst of the skin and subcutaneous tissue, unspecified: Secondary | ICD-10-CM | POA: Diagnosis not present

## 2017-07-06 NOTE — Patient Instructions (Addendum)
I will refer you to hand surgery to evaluate the nodule in your hand as well as to discuss the intermittent hand symptoms further. We can also discuss that at follow-up visit.  Area on scrotum appears to be possible skin cyst or hair bump. I do not see signs of infection at this time or need to drain/cut into that area. Okay to apply warm compresses few times per day, and recheck area in the next few weeks to determine if other treatment needed. Return sooner if spreading, enlarging, painful, or other worsening symptoms  I will refer you to gastroenterology for colonoscopy. Please schedule physical with me in next few months.    IF you received an x-ray today, you will receive an invoice from [facility]. Please contact [facility] at [phone] with questions or concerns regarding your invoice.   IF you received labwork today, you will receive an invoice from [facility]. Please contact [facility] at [phone] with questions or concerns regarding your invoice.   Our billing staff will not be able to assist you with questions regarding bills from these companies.  You will be contacted with the lab results as soon as they are available. The fastest way to get your results is to activate your My Chart account. Instructions are located on the last page of this paperwork. If you have not heard from [place] regarding the results in 2 weeks, please contact this office.

## 2017-07-06 NOTE — Progress Notes (Signed)
Subjective:  By signing my name below, I, Moises Blood, attest that this documentation has been prepared under the direction and in the presence of Merri Ray, MD. Electronically Signed: Moises Blood, [place]. 07/06/2017 , 3:28 PM .  Patient was seen in Room 11 .   Patient ID: Douglas Carpenter, male    DOB: 30-Mar-1956, 62 y.o.   MRN: [id number] Chief Complaint  Patient presents with  . Mass    near the rectal area and on the left hand x 2 weeks    HPI Douglas Carpenter is a 62 y.o. male Here for skin issues.   Patient states he first noticed a bump in his perineum about 2 weeks ago when he was taking a shower. He describes the nodule being somewhat mobile. He denies any discharge from the nodule. He denies any pain.   He then noticed a hard nodule in the palm of his left hand about a week ago. He denies any pain or changes. He mentions history of arthritis in his left hand. He denies applying any heat or ice to the area. He denies any pain. He denies any known injury.   His mother had a history of malignant cancer with a bump appearing over her belt line. She passed away 4 months after.   Health Maintenance Colonoscopy: last done about 8 years ago through the [place]; noted repeat in 5 years due to polyp.   Patient Active Problem List   Diagnosis Date Noted  . Exercise-induced asthma 07/21/2014  . Chronic cough 07/21/2014  . Upper airway cough syndrome 07/21/2014  . Allergic rhinitis 07/21/2014  . Lumbar stenosis 11/06/2013  . Urethral stricture 01/07/2013  . Prostatic hypertrophy 11/12/2012  . VOCAL CORD DISORDER 02/19/2009  . HYPERLIPIDEMIA 01/27/2009  . Snoring 01/27/2009  . HYPERTENSION 01/27/2009  . CHRONIC RHINITIS 01/27/2009  . Asthma 01/27/2009  . GERD 01/27/2009   Past Medical History:  Diagnosis Date  . Arthritis    "everywhere"  . BPH (benign prostatic hypertrophy)   . Chronic rhinitis 01/27/2009  . ED (erectile dysfunction)   . GERD (gastroesophageal reflux  disease)   . Headache(784.0)    migraines, as many as 3 in a week    . HYPERLIPIDEMIA 01/27/2009  . HYPERTENSION 01/27/2009  . Mild asthma    seasonal allergies, uses Proair once per yr.   . Mild obstructive sleep apnea    per pt -- mild osa test result--  no rx cpap needed  . Testosterone deficiency   . Urethral stricture   . Vocal cord anomaly    pt states told from [place]  doctor heard a little gurgle sound -- no farther testing done   Past Surgical History:  Procedure Laterality Date  . CYSTOSCOPY WITH URETHRAL DILATATION N/A 01/07/2013   Procedure: CYSTOSCOPY WITH URETHRAL DILATATION BALLOON DILATION ;  Surgeon: Bernestine Amass, MD;  Location: [facility];  Service: Urology;  Laterality: N/A;  . LUMBAR LAMINECTOMY/DECOMPRESSION MICRODISCECTOMY Right 11/29/2012   Procedure: Right Lumbar Four to Five Laminectomy/ Decompression Microdiskectomy;  Surgeon: Otilio Connors, MD;  Location: [place] [facility];  Service: Neurosurgery;  Laterality: Right;  LUMBAR LAMINECTOMY/DECOMPRESSION MICRODISCECTOMY 1 LEVEL  . SHOULDER ARTHROSCOPY W/ ACROMIAL REPAIR Left 08-28-2002   Allergies  Allergen Reactions  . Aspirin Rash    Pt can tolerate low doses-- INTOLERANT TO HIGHER DOSES   Prior to Admission medications   Medication Sig Start Date End Date Taking? Authorizing Provider  albuterol (PROAIR HFA) 108 (90  Base) MCG/ACT inhaler Inhale 2 puffs into the lungs every 6 (six) hours as needed for wheezing or shortness of breath. 10/05/16  Yes [name], MD  aspirin EC 81 MG tablet Take 81 mg by mouth every morning.   Yes [provider]  Aspirin-Acetaminophen-Caffeine (EXCEDRIN PO) Take 2 tablets by mouth 2 (two) times daily as needed (migraine).   Yes [provider]  atenolol (TENORMIN) 25 MG tablet Take 25 mg by mouth every morning.    Yes [provider]  atorvastatin (LIPITOR) 80 MG tablet Take 40 mg by mouth at bedtime.   Yes [provider]  cyclobenzaprine  (FLEXERIL) 10 MG tablet Take 0.5-1 tablets (5-10 mg total) by mouth 3 (three) times daily as needed for muscle spasms. 11/08/13  Yes Jovita Gamma, MD  diclofenac (VOLTAREN) 75 MG EC tablet Take 75 mg by mouth 2 (two) times daily.    Yes [provider]  fluticasone-salmeterol (ADVAIR HFA) 230-21 MCG/ACT inhaler Inhale 2 puffs into the lungs 2 (two) times daily. 04/03/17  Yes [name], MD  Ketotifen Fumarate (THERA TEARS ALLERGY OP) Place 1 drop into both eyes daily as needed (dry eyes).   Yes [provider]  montelukast (SINGULAIR) 10 MG tablet Take 1 tablet (10 mg total) by mouth at bedtime. 04/03/17  Yes [name], MD  Omega-3 Fatty Acids (FISH OIL) 1000 MG CPDR Take 2,000 mg by mouth 2 (two) times daily.     Yes [provider]  omeprazole (PRILOSEC) 20 MG capsule Take 20 mg by mouth daily.    Yes [provider]  topiramate (TOPAMAX) 25 MG tablet Take 25 mg by mouth daily.    Yes [provider]   Social History   Socioeconomic History  . Marital status: Married    Spouse name: Not on file  . Number of children: Not on file  . Years of education: Not on file  . Highest education level: Not on file  Social Needs  . Financial resource strain: Not on file  . Food insecurity - worry: Not on file  . Food insecurity - inability: Not on file  . Transportation needs - medical: Not on file  . Transportation needs - non-medical: Not on file  Occupational History  . Occupation: retired  Tobacco Use  . Smoking status: Former Smoker    Packs/day: 0.50    Years: 22.00    Pack years: 11.00    Types: Cigarettes    Last attempt to quit: 06/27/1988    Years since quitting: 29.0  . Smokeless tobacco: Never Used  Substance and Sexual Activity  . Alcohol use: Yes    Alcohol/week: 1.8 oz    Types: 3 Standard drinks or equivalent per week    Comment: 4-5 drinks / day- cognac   . Drug use: No  . Sexual activity: Not on file  Other Topics  Concern  . Not on file  Social History Narrative  . Not on file   Review of Systems  Constitutional: Negative for fatigue and unexpected weight change.  Eyes: Negative for visual disturbance.  Respiratory: Negative for cough, chest tightness and shortness of breath.   Cardiovascular: Negative for chest pain, palpitations and leg swelling.  Gastrointestinal: Negative for abdominal pain and blood in stool.  Genitourinary:       Mass, near rectum  Skin:       Mass, left hand  Neurological: Negative for dizziness, light-headedness and headaches.       Objective:  Physical Exam  Constitutional: He is oriented to person, place, and time. He appears well-developed and well-nourished. No distress.  HENT:  Head: Normocephalic and atraumatic.  Eyes: EOM are normal. Pupils are equal, round, and reactive to light.  Neck: Neck supple.  Cardiovascular: Normal rate.  Pulmonary/Chest: Effort normal. No respiratory distress.  Genitourinary:  Genitourinary Comments: There is a cystic appearing area at the base of his scrotum midline, no erythema, non tender, no fluctuance, measuring about 4-42mm across  Musculoskeletal: Normal range of motion.  Neurological: He is alert and oriented to person, place, and time.  Skin: Skin is warm and dry.  Left hand: firm nodular area in volar hand mid aspect over the volar mid 4th metacarpal; no change with 4th and 5th finger flexion, non tender, no contracture, able to extend fingers fully, as well as full flexion, no focal joint swelling seen  Psychiatric: He has a normal mood and affect. His behavior is normal.  Nursing note and vitals reviewed.   Vitals:   07/06/17 1507  BP: 132/72  Pulse: 65  Resp: 16  Temp: 98.4 F (36.9 C)  TempSrc: Oral  SpO2: 97%  Weight: 225 lb (102.1 kg)  Height: 5' 6.93" (1.7 m)      Assessment & Plan:   Douglas Carpenter is a 62 y.o. male Nodule of finger of left hand - Plan: Ambulatory referral to Hand  Surgery  -Possible ganglion cyst, but nontender. Contracture also in differential, but appears to have full range of motion/function of fingers on exam.  -Refer to hand surgeon for further evaluation.  Cyst of scrotum  -Epidermal/sebaceous cyst likely. No apparent signs of infection, no apparent abscess. Symptomatic care with warm compresses, monitoring for any increased size or changes, recheck next few weeks. Sooner if worse  Screen for colon cancer - Plan: Ambulatory referral to Gastroenterology   No orders of the defined types were placed in this encounter.  Patient Instructions   I will refer you to hand surgery to evaluate the nodule in your hand as well as to discuss the intermittent hand symptoms further. We can also discuss that at follow-up visit.  Area on scrotum appears to be possible skin cyst or hair bump. I do not see signs of infection at this time or need to drain/cut into that area. Okay to apply warm compresses few times per day, and recheck area in the next few weeks to determine if other treatment needed. Return sooner if spreading, enlarging, painful, or other worsening symptoms  I will refer you to gastroenterology for colonoscopy. Please schedule physical with me in next few months.    IF you received an x-ray today, you will receive an invoice from [facility]. Please contact [facility] at [phone] with questions or concerns regarding your invoice.   IF you received labwork today, you will receive an invoice from [facility]. Please contact [facility] at [phone] with questions or concerns regarding your invoice.   Our billing staff will not be able to assist you with questions regarding bills from these companies.  You will be contacted with the lab results as soon as they are available. The fastest way to get your results is to activate your My Chart account. Instructions are located on the last page of this paperwork. If you have not heard  from [place] regarding the results in 2 weeks, please contact this office.       I personally performed the services described in this documentation, which was scribed in  my presence. The recorded information has been reviewed and considered for accuracy and completeness, addended by me as needed, and agree with information above.  Signed,   Merri Ray, MD [facility] [place].  07/09/17 11:46 AM

## 2017-07-07 ENCOUNTER — Ambulatory Visit: Payer: Medicare PPO | Admitting: Physical Therapy

## 2017-07-07 DIAGNOSIS — M25651 Stiffness of right hip, not elsewhere classified: Secondary | ICD-10-CM

## 2017-07-07 DIAGNOSIS — M6281 Muscle weakness (generalized): Secondary | ICD-10-CM | POA: Diagnosis not present

## 2017-07-07 DIAGNOSIS — G8929 Other chronic pain: Secondary | ICD-10-CM

## 2017-07-07 DIAGNOSIS — M5442 Lumbago with sciatica, left side: Secondary | ICD-10-CM

## 2017-07-07 DIAGNOSIS — M25652 Stiffness of left hip, not elsewhere classified: Secondary | ICD-10-CM

## 2017-07-07 NOTE — Therapy (Signed)
[facility] [address], [place], [place], [address] Phone: [phone]   Fax:  [phone]  Physical Therapy Treatment  Patient Details  Name: Douglas Carpenter MRN: [id number] Date of Birth: 1955-12-03 Referring Provider: Dr. Jovita Gamma   Encounter Date: 07/07/2017  PT End of Session - 07/07/17 1051    Visit Number  10    Date for PT Re-Evaluation  08/02/17    Authorization Type  Medicare/tricare; No PTA;     PT Start Time  1016    PT Stop Time  1100    PT Time Calculation (min)  44 min    Activity Tolerance  Patient tolerated treatment well       Past Medical History:  Diagnosis Date  . Arthritis    "everywhere"  . BPH (benign prostatic hypertrophy)   . Chronic rhinitis 01/27/2009  . ED (erectile dysfunction)   . GERD (gastroesophageal reflux disease)   . Headache(784.0)    migraines, as many as 3 in a week    . HYPERLIPIDEMIA 01/27/2009  . HYPERTENSION 01/27/2009  . Mild asthma    seasonal allergies, uses Proair once per yr.   . Mild obstructive sleep apnea    per pt -- mild osa test result--  no rx cpap needed  . Testosterone deficiency   . Urethral stricture   . Vocal cord anomaly    pt states told from [place]  doctor heard a little gurgle sound -- no farther testing done    Past Surgical History:  Procedure Laterality Date  . CYSTOSCOPY WITH URETHRAL DILATATION N/A 01/07/2013   Procedure: CYSTOSCOPY WITH URETHRAL DILATATION BALLOON DILATION ;  Surgeon: Bernestine Amass, MD;  Location: [facility];  Service: Urology;  Laterality: N/A;  . LUMBAR LAMINECTOMY/DECOMPRESSION MICRODISCECTOMY Right 11/29/2012   Procedure: Right Lumbar Four to Five Laminectomy/ Decompression Microdiskectomy;  Surgeon: Otilio Connors, MD;  Location: [facility];  Service: Neurosurgery;  Laterality: Right;  LUMBAR LAMINECTOMY/DECOMPRESSION MICRODISCECTOMY 1 LEVEL  . SHOULDER ARTHROSCOPY W/ ACROMIAL REPAIR Left 08-28-2002     There were no vitals filed for this visit.  Subjective Assessment - 07/07/17 1021    Subjective  The traction was really good and I felt good the rest of the day.  Left > right stiffness from the cold weather today.      Currently in Pain?  Yes    Pain Score  6     Pain Location  Back    Pain Orientation  Lower    Pain Type  Chronic pain    Aggravating Factors   cold weather         [facility] PT Assessment - 07/07/17 0001      Observation/Other Assessments   Focus on Therapeutic Outcomes (FOTO)   57%      AROM   Lumbar Flexion  limited by 20%    Lumbar Extension  limited by 50%    Lumbar - Right Side Bend  limited 10%    Lumbar - Left Side Bend  limited 10%                  [facility] Adult PT Treatment/Exercise - 07/07/17 0001      Lumbar Exercises: Stretches   Active Hamstring Stretch  2 reps;30 seconds review of initial HEP    Single Knee to Chest Stretch  3 reps;20 seconds review of initial HEP    Piriformis Stretch  3 reps;30 seconds knee to opp  shoulder      Knee/Hip Exercises: Aerobic   Nustep  L3 x10 min      Moist Heat Therapy   Number Minutes Moist Heat  10 Minutes concurrent with ex    Moist Heat Location  Lumbar Spine      Traction   Type of Traction  Lumbar    Min (lbs)  50    Max (lbs)  80    Hold Time  45    Rest Time  15    Time  15               PT Short Term Goals - 07/04/17 1007      PT SHORT TERM GOAL #1   Title  independent with initial HEP    Status  Achieved      PT SHORT TERM GOAL #2   Title  bilateral hip flexion increased by 5-10 degrees so he is able to sit with greater ease and less numbness in right lateral thigh    Status  Achieved      PT SHORT TERM GOAL #3   Title  ability to brace his abdominals correctly so he is able to perform transitional movements ie, sit to stand, in and out of car and in and out of bed, with moderate pain    Status  Achieved      PT SHORT TERM GOAL #4   Title  understand correct  body mechanics with daily activities to assist in pain management and improve function    Status  Achieved        PT Long Term Goals - 06/07/17 1154      PT LONG TERM GOAL #1   Title  independent with HEP and understand how to progress himself    Time  8    Period  Weeks    Status  On-going    Target Date  08/02/17      PT LONG TERM GOAL #2   Title  increased bilateral hip strength >/= 4/5 so he is able to perform daily activities with greater ease     Time  8    Period  Weeks    Status  On-going    Target Date  08/02/17      PT LONG TERM GOAL #3   Title  FOTO score </= 46% limitation    Baseline  57% limitation    Time  8    Period  Weeks    Status  On-going    Target Date  08/02/17      PT LONG TERM GOAL #4   Title  improved hip rotation and flexion  due to increased flexibility so he is able to get in and out of the car with >/= 50% greater ease    Time  8    Period  Weeks    Status  On-going    Target Date  08/02/17            Plan - 07/07/17 1234    Clinical Impression Statement  FOTO outcome unchanged since last month however the patient states he feels PT is helping him to learn some things to manage his chronic LBP. Some improvement in lumbar ROM.    He reports compliance with his HEP.  Good relief of symptoms for the whole day following initial trial of traction last visit.      Rehab Potential  Excellent    Clinical Impairments Affecting Rehab Potential  3  back surgeries; diabete; left shoulder surgery; no lifting more than 20#    PT Frequency  2x / week    PT Duration  8 weeks    PT Treatment/Interventions  Cryotherapy;Electrical Stimulation;Moist Heat;Ultrasound;Therapeutic activities;Therapeutic exercise;Balance training;Neuromuscular re-education;Patient/family education;Manual techniques;Dry needling;Energy conservation    PT Next Visit Plan   hip/lumbar mobilizations; hip stretches/strength; core strength; mechanical lumbar  traction        Patient will benefit from skilled therapeutic intervention in order to improve the following deficits and impairments:  Decreased range of motion, Difficulty walking, Increased fascial restricitons, Decreased endurance, Decreased activity tolerance, Pain, Increased muscle spasms, Decreased strength, Decreased mobility  Visit Diagnosis: Muscle weakness (generalized)  Chronic right-sided low back pain with left-sided sciatica  Stiffness of right hip, not elsewhere classified  Stiffness of left hip, not elsewhere classified     Problem List Patient Active Problem List   Diagnosis Date Noted  . Exercise-induced asthma 07/21/2014  . Chronic cough 07/21/2014  . Upper airway cough syndrome 07/21/2014  . Allergic rhinitis 07/21/2014  . Lumbar stenosis 11/06/2013  . Urethral stricture 01/07/2013  . Prostatic hypertrophy 11/12/2012  . VOCAL CORD DISORDER 02/19/2009  . HYPERLIPIDEMIA 01/27/2009  . Snoring 01/27/2009  . HYPERTENSION 01/27/2009  . CHRONIC RHINITIS 01/27/2009  . Asthma 01/27/2009  . GERD 01/27/2009   Ruben Im, PT 07/07/17 12:38 PM Phone: [phone] Fax: [phone]  Alvera Singh 07/07/2017, 12:37 PM  [facility] [address], [place], [place], [address] Phone: [phone]   Fax:  [phone]  Name: Douglas Carpenter MRN: [id number] Date of Birth: 1956-04-07

## 2017-07-09 ENCOUNTER — Encounter: Payer: Self-pay | Admitting: Family Medicine

## 2017-07-11 ENCOUNTER — Encounter: Payer: Self-pay | Admitting: Physical Therapy

## 2017-07-13 ENCOUNTER — Encounter: Payer: Self-pay | Admitting: Physical Therapy

## 2017-07-18 ENCOUNTER — Ambulatory Visit: Payer: Medicare PPO | Admitting: Physical Therapy

## 2017-07-18 ENCOUNTER — Encounter: Payer: Self-pay | Admitting: Physical Therapy

## 2017-07-18 DIAGNOSIS — M6281 Muscle weakness (generalized): Secondary | ICD-10-CM

## 2017-07-18 DIAGNOSIS — M25651 Stiffness of right hip, not elsewhere classified: Secondary | ICD-10-CM

## 2017-07-18 DIAGNOSIS — M25652 Stiffness of left hip, not elsewhere classified: Secondary | ICD-10-CM

## 2017-07-18 DIAGNOSIS — G8929 Other chronic pain: Secondary | ICD-10-CM

## 2017-07-18 DIAGNOSIS — M5442 Lumbago with sciatica, left side: Secondary | ICD-10-CM

## 2017-07-18 NOTE — Therapy (Signed)
[facility] [address], [place], [place], [address] Phone: [phone]   Fax:  [phone]  Physical Therapy Treatment  Patient Details  Name: Douglas Carpenter MRN: [id number] Date of Birth: [birth year] Referring Provider: Dr. Jovita Gamma   Encounter Date: 07/18/2017  PT End of Session - 07/18/17 1119    Visit Number  11    Date for PT Re-Evaluation  08/02/17    Authorization Type  Medicare/tricare; No PTA;     PT Start Time  1051    PT Stop Time  1130    PT Time Calculation (min)  39 min    Activity Tolerance  Patient tolerated treatment well       Past Medical History:  Diagnosis Date  . Arthritis    "everywhere"  . BPH (benign prostatic hypertrophy)   . Chronic rhinitis 01/27/2009  . ED (erectile dysfunction)   . GERD (gastroesophageal reflux disease)   . Headache(784.0)    migraines, as many as 3 in a week    . HYPERLIPIDEMIA 01/27/2009  . HYPERTENSION 01/27/2009  . Mild asthma    seasonal allergies, uses Proair once per yr.   . Mild obstructive sleep apnea    per pt -- mild osa test result--  no rx cpap needed  . Testosterone deficiency   . Urethral stricture   . Vocal cord anomaly    pt states told from [place]  doctor heard a little gurgle sound -- no farther testing done    Past Surgical History:  Procedure Laterality Date  . CYSTOSCOPY WITH URETHRAL DILATATION N/A 01/07/2013   Procedure: CYSTOSCOPY WITH URETHRAL DILATATION BALLOON DILATION ;  Surgeon: Bernestine Amass, MD;  Location: [facility];  Service: Urology;  Laterality: N/A;  . LUMBAR LAMINECTOMY/DECOMPRESSION MICRODISCECTOMY Right 11/29/2012   Procedure: Right Lumbar Four to Five Laminectomy/ Decompression Microdiskectomy;  Surgeon: Otilio Connors, MD;  Location: [facility];  Service: Neurosurgery;  Laterality: Right;  LUMBAR LAMINECTOMY/DECOMPRESSION MICRODISCECTOMY 1 LEVEL  . SHOULDER ARTHROSCOPY W/ ACROMIAL REPAIR Left 08-28-2002     There were no vitals filed for this visit.  Subjective Assessment - 07/18/17 1052    Subjective  Sick last week with migraines.  I've had 2 or 3 a week.  Back about the same.  Right > left back pain.  Mechanical traction helps for a day or so.  That is the best out of everything we've down.   Reports he is doing his HEP.  His partner does the long length leg pull.  Usually walk every day but too cold this AM.      Currently in Pain?  Yes    Pain Score  7     Pain Location  Back    Pain Orientation  Right;Left    Pain Type  Chronic pain    Aggravating Factors   cold weather      Pain Relieving Factors  heat; traction helps while its on                      [facility] Adult PT Treatment/Exercise - 07/18/17 0001      Knee/Hip Exercises: Aerobic   Nustep  L3 x11 min      Knee/Hip Exercises: Machines for Strengthening   Other Machine  Extension discussion of selection of  appropriate gym program with bias of seated exercise vs. Standing and aquatic ex at the [employer]  Type of Traction  Lumbar    Min (lbs)  50    Max (lbs)  80    Hold Time  45    Rest Time  15    Time  15               PT Short Term Goals - 07/18/17 1124      PT SHORT TERM GOAL #1   Title  independent with initial HEP    Status  Achieved      PT SHORT TERM GOAL #2   Title  bilateral hip flexion increased by 5-10 degrees so he is able to sit with greater ease and less numbness in right lateral thigh    Status  Achieved      PT SHORT TERM GOAL #3   Title  ability to brace his abdominals correctly so he is able to perform transitional movements ie, sit to stand, in and out of car and in and out of bed, with moderate pain    Status  Achieved      PT SHORT TERM GOAL #4   Title  understand correct body mechanics with daily activities to assist in pain management and improve function    Status  Achieved        PT Long Term Goals - 06/07/17 1154      PT LONG TERM GOAL #1    Title  independent with HEP and understand how to progress himself    Time  8    Period  Weeks    Status  On-going    Target Date  08/02/17      PT LONG TERM GOAL #2   Title  increased bilateral hip strength >/= 4/5 so he is able to perform daily activities with greater ease     Time  8    Period  Weeks    Status  On-going    Target Date  08/02/17      PT LONG TERM GOAL #3   Title  FOTO score </= 46% limitation    Baseline  57% limitation    Time  8    Period  Weeks    Status  On-going    Target Date  08/02/17      PT LONG TERM GOAL #4   Title  improved hip rotation and flexion  due to increased flexibility so he is able to get in and out of the car with >/= 50% greater ease    Time  8    Period  Weeks    Status  On-going    Target Date  08/02/17            Plan - 07/18/17 1120    Clinical Impression Statement  [facility] [employer] [facility]  Discussed the benefits of aquatic ex and self "tractioning" in the water.  Also discussed seated strengthening ex in the gym including Nu-Step, leg press, seated row and lat bar and limited standing equipment secondary to exacerbation of symptoms in this position.  Anticipate discharge from PT next visit.      Rehab Potential  Excellent    Clinical Impairments Affecting Rehab Potential  3 back surgeries; diabete; left shoulder surgery; no lifting more than 20#    PT Frequency  2x / week    PT Duration  8 weeks    PT Treatment/Interventions  Cryotherapy;Electrical Stimulation;Moist Heat;Ultrasound;Therapeutic activities;Therapeutic exercise;Balance training;Neuromuscular re-education;Patient/family education;Manual techniques;Dry needling;Energy conservation  PT Next Visit Plan  recheck lumbar ROM, MMT, progress toward goals, FOTO for probable discharge;  review of  gym equipment        Patient will benefit from skilled therapeutic intervention in order to improve the following deficits and  impairments:  Decreased range of motion, Difficulty walking, Increased fascial restricitons, Decreased endurance, Decreased activity tolerance, Pain, Increased muscle spasms, Decreased strength, Decreased mobility  Visit Diagnosis: Muscle weakness (generalized)  Chronic right-sided low back pain with left-sided sciatica  Stiffness of right hip, not elsewhere classified  Stiffness of left hip, not elsewhere classified     Problem List Patient Active Problem List   Diagnosis Date Noted  . Exercise-induced asthma 07/21/2014  . Chronic cough 07/21/2014  . Upper airway cough syndrome 07/21/2014  . Allergic rhinitis 07/21/2014  . Lumbar stenosis 11/06/2013  . Urethral stricture 01/07/2013  . Prostatic hypertrophy 11/12/2012  . VOCAL CORD DISORDER 02/19/2009  . HYPERLIPIDEMIA 01/27/2009  . Snoring 01/27/2009  . HYPERTENSION 01/27/2009  . CHRONIC RHINITIS 01/27/2009  . Asthma 01/27/2009  . GERD 01/27/2009   Ruben Im, PT 07/18/17 11:36 AM Phone: [phone] Fax: [phone]  Alvera Singh 07/18/2017, 11:25 AM  [facility] [address], [place], [place], [address] Phone: [phone]   Fax:  [phone]  Name: Douglas Carpenter MRN: [id number] Date of Birth: 25-Jan-1956

## 2017-07-20 ENCOUNTER — Ambulatory Visit: Payer: Medicare PPO | Admitting: Physical Therapy

## 2017-07-20 ENCOUNTER — Encounter: Payer: Self-pay | Admitting: Physical Therapy

## 2017-07-20 DIAGNOSIS — M25651 Stiffness of right hip, not elsewhere classified: Secondary | ICD-10-CM

## 2017-07-20 DIAGNOSIS — M6281 Muscle weakness (generalized): Secondary | ICD-10-CM

## 2017-07-20 DIAGNOSIS — M5442 Lumbago with sciatica, left side: Secondary | ICD-10-CM

## 2017-07-20 DIAGNOSIS — M25652 Stiffness of left hip, not elsewhere classified: Secondary | ICD-10-CM

## 2017-07-20 DIAGNOSIS — G8929 Other chronic pain: Secondary | ICD-10-CM

## 2017-07-20 NOTE — Patient Instructions (Signed)
    Lying face down: Have your partner use heels of your palms to apply medium pressure as you exhale    [facility] [address], [place], [place] [address] Phone # [phone] Fax [phone]

## 2017-07-20 NOTE — Therapy (Signed)
[facility] [address], [address] [place], [place], [address] Phone: [phone]   Fax:  [phone]  Physical Therapy Treatment/Discharge Summary  Patient Details  Name: Douglas Carpenter MRN: [id number] Date of Birth: 01/10/1956 Referring Provider: Dr. Robert Nudelman   Encounter Date: 07/20/2017  PT End of Session - 07/20/17 1504    Visit Number  12    Date for PT Re-Evaluation  08/02/17    Authorization Type  Medicare/tricare; No PTA;     PT Start Time  1100    PT Stop Time  1145    PT Time Calculation (min)  45 min    Activity Tolerance  Patient tolerated treatment well       Past Medical History:  Diagnosis Date  . Arthritis    "everywhere"  . BPH (benign prostatic hypertrophy)   . Chronic rhinitis 01/27/2009  . ED (erectile dysfunction)   . GERD (gastroesophageal reflux disease)   . Headache(784.0)    migraines, as many as 3 in a week    . HYPERLIPIDEMIA 01/27/2009  . HYPERTENSION 01/27/2009  . Mild asthma    seasonal allergies, uses Proair once per yr.   . Mild obstructive sleep apnea    per pt -- mild osa test result--  no rx cpap needed  . Testosterone deficiency   . Urethral stricture   . Vocal cord anomaly    pt states [place] [facility]  doctor heard a little gurgle sound -- no farther testing done    Past Surgical History:  Procedure Laterality Date  . CYSTOSCOPY WITH URETHRAL DILATATION N/A 01/07/2013   Procedure: CYSTOSCOPY WITH URETHRAL DILATATION BALLOON DILATION ;  Surgeon: David S Grapey, MD;  Location: [facility];  Service: Urology;  Laterality: N/A;  . LUMBAR LAMINECTOMY/DECOMPRESSION MICRODISCECTOMY Right 11/29/2012   Procedure: Right Lumbar Four to Five Laminectomy/ Decompression Microdiskectomy;  Surgeon: James R Hirsch, MD;  Location: [facility];  Service: Neurosurgery;  Laterality: Right;  LUMBAR LAMINECTOMY/DECOMPRESSION MICRODISCECTOMY 1 LEVEL  . SHOULDER ARTHROSCOPY W/ ACROMIAL  REPAIR Left 08-28-2002    There were no vitals filed for this visit.  Subjective Assessment - 07/20/17 1054    Subjective  [facility]  [facility] this afternoon.  Patient requests instructions on how to do lumbar (PA) mobilization at home with his partner's assist since it felt so good.      Currently in Pain?  Yes    Pain Score  7     Pain Location  Back    Pain Type  Chronic pain    Aggravating Factors   cold weather    Pain Relieving Factors  heat, traction          [facility] PT Assessment - 07/20/17 0001      Observation/Other Assessments   Focus on Therapeutic Outcomes (FOTO)   48% limitation       AROM   Lumbar Flexion  20    Lumbar Extension  20    Lumbar - Right Side Bend  15    Lumbar - Left Side Bend  15      Strength   Right Hip Flexion  4-/5    Right Hip Extension  4/5    Right Hip ABduction  4/5    Left Hip Flexion  4-/5    Left Hip Extension  4-/5    Left Hip ABduction  4/5    Right Knee Extension  4+/5      Left Knee Extension  4+/5                  [facility] Adult PT Treatment/Exercise - 07/20/17 0001      Self-Care   Self-Care  Other Self-Care Comments written and pictoral instructions given on prone exten mob    Other Self-Care Comments   discussion of aquatic ex program and progression over time      Knee/Hip Exercises: Aerobic   Nustep  L3 x10 min      Traction   Type of Traction  Lumbar    Min (lbs)  55    Max (lbs)  85    Hold Time  45    Rest Time  15    Time  15             PT Education - 07/20/17 1504    Education provided  Yes    Education Details  prone extension mobilization to be done by his partner    Person(s) Educated  Patient    Methods  Explanation;Handout    Comprehension  Verbalized understanding       PT Short Term Goals - 07/20/17 1512      PT SHORT TERM GOAL #1   Title  independent with initial HEP    Status  Achieved      PT SHORT TERM GOAL #2   Title  bilateral hip flexion  increased by 5-10 degrees so he is able to sit with greater ease and less numbness in right lateral thigh    Status  Achieved      PT SHORT TERM GOAL #3   Title  ability to brace his abdominals correctly so he is able to perform transitional movements ie, sit to stand, in and out of car and in and out of bed, with moderate pain    Status  Achieved      PT SHORT TERM GOAL #4   Title  understand correct body mechanics with daily activities to assist in pain management and improve function    Status  Achieved        PT Long Term Goals - 07/20/17 1101      PT LONG TERM GOAL #1   Title  independent with HEP and understand how to progress himself    Status  Achieved      PT LONG TERM GOAL #2   Title  increased bilateral hip strength >/= 4/5 so he is able to perform daily activities with greater ease     Status  Achieved      PT LONG TERM GOAL #3   Title  FOTO score </= 46% limitation    Status  Partially Met      PT LONG TERM GOAL #4   Title  improved hip rotation and flexion  due to increased flexibility so he is able to get in and out of the car with >/= 50% greater ease    Baseline  10% better    Status  Partially Met            Plan - 07/20/17 1505    Clinical Impression Statement  The patient expresses readiness for discharge to independent HEP and [facility]  He reports his best relief has been with traction and we have discussed how he should have similar relief in the pool.  He has made improvements in LE and core strength and a slight improvement in FOTO score   from 50% limitation to 48%.  He did not have a larger improvement secondary to chronic history including previous back surgeries.  He has met the majority of rehab goals.  Discharge from PT at this time.         Patient will benefit from skilled therapeutic intervention in order to improve the following deficits and impairments:     Visit Diagnosis: Muscle weakness  (generalized)  Chronic right-sided low back pain with left-sided sciatica  Stiffness of right hip, not elsewhere classified  Stiffness of left hip, not elsewhere classified    PHYSICAL THERAPY DISCHARGE SUMMARY  Visits from Start of Care: 12  Current functional level related to goals / functional outcomes: See clinical impressions above   Remaining deficits: As above   Education / Equipment: Comprehensive HEP Plan: Patient agrees to discharge.  Patient goals were partially met. Patient is being discharged due to meeting the stated rehab goals.  ?????          Problem List Patient Active Problem List   Diagnosis Date Noted  . Exercise-induced asthma 07/21/2014  . Chronic cough 07/21/2014  . Upper airway cough syndrome 07/21/2014  . Allergic rhinitis 07/21/2014  . Lumbar stenosis 11/06/2013  . Urethral stricture 01/07/2013  . Prostatic hypertrophy 11/12/2012  . VOCAL CORD DISORDER 02/19/2009  . HYPERLIPIDEMIA 01/27/2009  . Snoring 01/27/2009  . HYPERTENSION 01/27/2009  . CHRONIC RHINITIS 01/27/2009  . Asthma 01/27/2009  . GERD 01/27/2009   Ruben Im, PT 07/20/17 3:15 PM Phone: [phone] Fax: [phone]  Alvera Singh 07/20/2017, 3:13 PM  [facility] [address], [place], [place], [address] Phone: [phone]   Fax:  [phone]  Name: Douglas Carpenter MRN: [id number] Date of Birth: June 16, 1956

## 2017-07-24 ENCOUNTER — Encounter: Payer: Self-pay | Admitting: Family Medicine

## 2017-07-24 ENCOUNTER — Ambulatory Visit (INDEPENDENT_AMBULATORY_CARE_PROVIDER_SITE_OTHER): Payer: Medicare PPO | Admitting: Family Medicine

## 2017-07-24 ENCOUNTER — Other Ambulatory Visit: Payer: Self-pay

## 2017-07-24 VITALS — BP 128/82 | HR 81 | Temp 99.2°F | Resp 18 | Ht 66.34 in | Wt 223.2 lb

## 2017-07-24 DIAGNOSIS — G894 Chronic pain syndrome: Secondary | ICD-10-CM

## 2017-07-24 DIAGNOSIS — R454 Irritability and anger: Secondary | ICD-10-CM | POA: Diagnosis not present

## 2017-07-24 DIAGNOSIS — Z125 Encounter for screening for malignant neoplasm of prostate: Secondary | ICD-10-CM

## 2017-07-24 DIAGNOSIS — Z0000 Encounter for general adult medical examination without abnormal findings: Secondary | ICD-10-CM | POA: Diagnosis not present

## 2017-07-24 DIAGNOSIS — Z23 Encounter for immunization: Secondary | ICD-10-CM | POA: Diagnosis not present

## 2017-07-24 MED ORDER — DULOXETINE HCL 30 MG PO CPEP: 30.0000 mg | ORAL_CAPSULE | Freq: Every day | ORAL | 1 refills | Status: DC

## 2017-07-24 MED ORDER — ZOSTER VAC RECOMB ADJUVANTED 50 MCG/0.5ML IM SUSR: 0.5000 mL | Freq: Once | INTRAMUSCULAR | 1 refills | Status: AC

## 2017-07-24 NOTE — Progress Notes (Addendum)
Subjective:  By signing my name below, I, Douglas Carpenter, attest that this documentation has been prepared under the direction and in the presence of Wendie Agreste, MD Electronically Signed: Ladene Artist, ED Scribe 07/24/2017 at 11:42 AM.   Patient ID: Douglas Carpenter, male    DOB: Mar 22, 1956, 62 y.o.   MRN: [id number]  Chief Complaint  Patient presents with  . Annual Exam   HPI Douglas Carpenter is a 62 y.o. male who presents to Primary Care at [facility] for an annual exam. Had an annual wellness visit 03/2017. H/o HTN, asthma, GERD, hyperlipidemia.  HTN Atenolol 25 mg qd.  Asthma Followed by Dr. Halford Chessman. Seen in Oct. Continued on Advair, Singulair with prn Albuterol and Singulair, Flonase and antihistamine for upper airway cough and Prilosec for GERD.  Treated for Lumbar Spondylolysis with Radiculopathy Followed by Dr. Sherwood Gambler. Pt completed his last PT session on 1/24 which he does not feel improved pain.  Irritability Pt reports that he has been more irritable with everyone for x 1 yr which he attributes to being in chronic pain. Reports chronic back pain and chronic migraines which is being treated with botox at [facility]. He has seen [facility] pain management but states he stopped going because he did not want to continue taking narcotics. Also reports anhedonia; pt states that he used to fish all the time but has not been fishing within 2 yrs. Wife states that he just sits in front of the TV now upset with everyone, including the dog. Pt also reports some difficulty sleeping which he attributes to pain. 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 Denies SI/HI.  CA Screening Colonoscopy: reportedly 8 yrs ago, plan for rpt in 5 yrs. Done through the [place]. Also referred earlier this month to GI. Prostate CA Screening: pt agrees to PSA and DRE today Lab Results  Component Value Date   PSA 1.79 11/12/2012   PSA 2.91 05/31/2010   Immunizations Immunization History  Administered Date(s)  Administered  . Influenza Split 03/21/2012  . Influenza Whole 03/08/2011  . Influenza,inj,Quad PF,6+ Mos 05/26/2014, 03/06/2015, 04/03/2017  . Pneumococcal Conjugate-13 05/26/2014  . Pneumococcal Polysaccharide-23 11/30/2012  Shingles: printed today  Depression Screening Depression screen [facility] 2/9 07/24/2017 07/06/2017 04/19/2017 02/10/2017 12/24/2016  Decreased Interest 0 0 0 0 0  Down, Depressed, Hopeless 0 0 0 0 0  PHQ - 2 Score 0 0 0 0 0   Fall Screening Has had falls due to back issues. Is in PT and followed by neurosurgery as above.  Functional Status Survey: Is the patient deaf or have difficulty hearing?: Yes(hearing aids ) Does the patient have difficulty seeing, even when wearing glasses/contacts?: Yes Does the patient have difficulty concentrating, remembering, or making decisions?: No Does the patient have difficulty walking or climbing stairs?: Yes Does the patient have difficulty dressing or bathing?: Yes(cant reach feet and trouble pulling up pants ) Does the patient have difficulty doing errands alone such as visiting a doctor's office or shopping?: No Does wear hearing aids. Some difficulty due to back issues but currently being treated with PT.    Visual Acuity Screening   Right eye Left eye Both eyes  Without correction:     With correction: 20/13-1 20/15-1 20/13   Vision: followed at [facility] Dentist: seen regularly; last visit was the beginning of the yr Exercise: limited due to back but tries to walk for exercise  Patient Active Problem List   Diagnosis Date Noted  .  Exercise-induced asthma 07/21/2014  . Chronic cough 07/21/2014  . Upper airway cough syndrome 07/21/2014  . Allergic rhinitis 07/21/2014  . Lumbar stenosis 11/06/2013  . Urethral stricture 01/07/2013  . Prostatic hypertrophy 11/12/2012  . VOCAL CORD DISORDER 02/19/2009  . HYPERLIPIDEMIA 01/27/2009  . Snoring 01/27/2009  . HYPERTENSION 01/27/2009  . CHRONIC RHINITIS 01/27/2009  . Asthma  01/27/2009  . GERD 01/27/2009   Past Medical History:  Diagnosis Date  . Arthritis    "everywhere"  . BPH (benign prostatic hypertrophy)   . Chronic rhinitis 01/27/2009  . ED (erectile dysfunction)   . GERD (gastroesophageal reflux disease)   . Headache(784.0)    migraines, as many as 3 in a week    . HYPERLIPIDEMIA 01/27/2009  . HYPERTENSION 01/27/2009  . Mild asthma    seasonal allergies, uses Proair once per yr.   . Mild obstructive sleep apnea    per pt -- mild osa test result--  no rx cpap needed  . Testosterone deficiency   . Urethral stricture   . Vocal cord anomaly    pt states told from [place]  doctor heard a little gurgle sound -- no farther testing done   Past Surgical History:  Procedure Laterality Date  . CYSTOSCOPY WITH URETHRAL DILATATION N/A 01/07/2013   Procedure: CYSTOSCOPY WITH URETHRAL DILATATION BALLOON DILATION ;  Surgeon: Bernestine Amass, MD;  Location: [facility];  Service: Urology;  Laterality: N/A;  . LUMBAR LAMINECTOMY/DECOMPRESSION MICRODISCECTOMY Right 11/29/2012   Procedure: Right Lumbar Four to Five Laminectomy/ Decompression Microdiskectomy;  Surgeon: Otilio Connors, MD;  Location: [place] [facility];  Service: Neurosurgery;  Laterality: Right;  LUMBAR LAMINECTOMY/DECOMPRESSION MICRODISCECTOMY 1 LEVEL  . SHOULDER ARTHROSCOPY W/ ACROMIAL REPAIR Left 08-28-2002   Allergies  Allergen Reactions  . Aspirin Rash    Pt can tolerate low doses-- INTOLERANT TO HIGHER DOSES   Prior to Admission medications   Medication Sig Start Date End Date Taking? Authorizing Provider  albuterol (PROAIR HFA) 108 (90 Base) MCG/ACT inhaler Inhale 2 puffs into the lungs every 6 (six) hours as needed for wheezing or shortness of breath. 10/05/16  Yes [name], MD  aspirin EC 81 MG tablet Take 81 mg by mouth every morning.   Yes [provider]  Aspirin-Acetaminophen-Caffeine (EXCEDRIN PO) Take 2 tablets by mouth 2 (two) times daily as needed (migraine).   Yes  [provider]  atenolol (TENORMIN) 25 MG tablet Take 25 mg by mouth every morning.    Yes [provider]  atorvastatin (LIPITOR) 80 MG tablet Take 40 mg by mouth at bedtime.   Yes [provider]  cyclobenzaprine (FLEXERIL) 10 MG tablet Take 0.5-1 tablets (5-10 mg total) by mouth 3 (three) times daily as needed for muscle spasms. 11/08/13  Yes Jovita Gamma, MD  diclofenac (VOLTAREN) 75 MG EC tablet Take 75 mg by mouth 2 (two) times daily.    Yes [provider]  fluticasone-salmeterol (ADVAIR HFA) 230-21 MCG/ACT inhaler Inhale 2 puffs into the lungs 2 (two) times daily. 04/03/17  Yes [name], MD  Ketotifen Fumarate (THERA TEARS ALLERGY OP) Place 1 drop into both eyes daily as needed (dry eyes).   Yes [provider]  montelukast (SINGULAIR) 10 MG tablet Take 1 tablet (10 mg total) by mouth at bedtime. 04/03/17  Yes [name], MD  Omega-3 Fatty Acids (FISH OIL) 1000 MG CPDR Take 2,000 mg by mouth 2 (two) times daily.     Yes [provider]  omeprazole ([place])  20 MG capsule Take 20 mg by mouth daily.    Yes [provider]  topiramate (TOPAMAX) 25 MG tablet Take 25 mg by mouth daily.    Yes [provider]   Social History   Socioeconomic History  . Marital status: Married    Spouse name: Not on file  . Number of children: Not on file  . Years of education: Not on file  . Highest education level: Not on file  Social Needs  . Financial resource strain: Not on file  . Food insecurity - worry: Not on file  . Food insecurity - inability: Not on file  . Transportation needs - medical: Not on file  . Transportation needs - non-medical: Not on file  Occupational History  . Occupation: retired  Tobacco Use  . Smoking status: Former Smoker    Packs/day: 0.50    Years: 22.00    Pack years: 11.00    Types: Cigarettes    Last attempt to quit: 06/27/1988    Years since quitting: 29.0  . Smokeless tobacco: Never  Used  Substance and Sexual Activity  . Alcohol use: Yes    Alcohol/week: 1.8 oz    Types: 3 Standard drinks or equivalent per week    Comment: 4-5 drinks / day- cognac   . Drug use: No  . Sexual activity: Not on file  Other Topics Concern  . Not on file  Social History Narrative  . Not on file   Review of Systems  Eyes: Positive for photophobia, redness and itching.  Respiratory: Positive for wheezing.   Musculoskeletal: Positive for arthralgias, back pain and neck pain.  Neurological: Positive for light-headedness, numbness and headaches.  Psychiatric/Behavioral: Positive for agitation, dysphoric mood and sleep disturbance. Negative for suicidal ideas.  13 point ROS neg other than the above    Objective:   Physical Exam  Constitutional: He is oriented to person, place, and time. He appears well-developed and well-nourished.  HENT:  Head: Normocephalic and atraumatic.  Right Ear: External ear normal.  Left Ear: External ear normal.  Mouth/Throat: Oropharynx is clear and moist.  Eyes: Conjunctivae and EOM are normal. Pupils are equal, round, and reactive to light.  Neck: Normal range of motion. Neck supple. No thyromegaly present.  Cardiovascular: Normal rate, regular rhythm, normal heart sounds and intact distal pulses.  Pulmonary/Chest: Effort normal and breath sounds normal. No respiratory distress. He has no wheezes.  Abdominal: Soft. He exhibits no distension. There is no tenderness. Hernia confirmed negative in the right inguinal area and confirmed negative in the left inguinal area.  Genitourinary: Prostate normal.  Musculoskeletal: Normal range of motion. He exhibits no edema or tenderness.  Neg seated straight leg raise in L, pulling sensation in R. Strength intact in feet. Well-healed scar lower lumbar.  Lymphadenopathy:    He has no cervical adenopathy.  Neurological: He is alert and oriented to person, place, and time. He has normal reflexes.  Skin: Skin is warm and  dry.  Psychiatric: He has a normal mood and affect. His behavior is normal.  Vitals reviewed.   Vitals:   07/24/17 1053  BP: 128/82  Pulse: 81  Resp: 18  Temp: 99.2 F (37.3 C)  TempSrc: Oral  SpO2: 98%  Weight: 223 lb 3.2 oz (101.2 kg)  Height: 5' 6.34" (1.685 m)      Assessment & Plan:   Douglas Carpenter is a 62 y.o. male Annual physical exam  - -anticipatory guidance as below in AVS,  screening labs above. Health maintenance items as above in HPI discussed/recommended as applicable.   - recommended optho/eye care provider eval for screening and to discuss sx's - can refer if needed.   Chronic pain syndrome - Plan: DULoxetine (CYMBALTA) 30 MG capsule, DISCONTINUED: DULoxetine (CYMBALTA) 30 MG capsule Irritability - Plan: DULoxetine (CYMBALTA) 30 MG capsule, DISCONTINUED: DULoxetine (CYMBALTA) 30 MG capsule  - recommended initially starting Cymbalta 30 mg. This may help both irritability and chronic pain symptoms. May need higher dose, recheck in 2 weeks.   - Follow-up with neurosurgeon to decide if other interventions Or if pain management needed  Screening for prostate cancer - Plan: PSA  - We discussed pros and cons of prostate cancer screening, and after this discussion, he chose to have screening done. PSA obtained, and no concerning findings on DRE.   Need for shingles vaccine - Plan: Zoster Vaccine Adjuvanted [facility]) injection sent to pharmacy  Meds ordered this encounter  Medications  . DISCONTD: DULoxetine (CYMBALTA) 30 MG capsule    Sig: Take 1 capsule (30 mg total) by mouth daily.    Dispense:  30 capsule    Refill:  1  . DULoxetine (CYMBALTA) 30 MG capsule    Sig: Take 1 capsule (30 mg total) by mouth daily.    Dispense:  30 capsule    Refill:  1  . Zoster Vaccine Adjuvanted [facility]) injection    Sig: Inject 0.5 mLs into the muscle once for 1 dose. Repeat in 2-6 months.    Dispense:  0.5 mL    Refill:  1   Patient Instructions   If you feel that  physical therapy did not help your symptoms, I would recommend meeting with Dr. Sherwood Gambler or pain management specialist. If referral is needed from me, let me know.   Start Cymbalta, and follow up in next 2 weeks. Please call one of the counselors to discuss your mood symptoms further. Let me know if you need other numbers.  Arvil Chaco, [place] [phone].  Vivia Budge: [phone]  Schedule eye care provider appointment for eye symptoms and screening.   Keeping you healthy  Get these tests  Blood pressure- Have your blood pressure checked once a year by your healthcare provider.  Normal blood pressure is 120/80  Weight- Have your body mass index (BMI) calculated to screen for obesity.  BMI is a measure of body fat based on height and weight. You can also calculate your own BMI at ViewBanking.[email or web address].  Cholesterol- Have your cholesterol checked every year.  Diabetes- Have your blood sugar checked regularly if you have high blood pressure, high cholesterol, have a family history of diabetes or if you are overweight.  Screening for Colon Cancer- Colonoscopy starting at age 38.  Screening may begin sooner depending on your family history and other health conditions. Follow up colonoscopy as directed by your Gastroenterologist.  Screening for Prostate Cancer- Both blood work (PSA) and a rectal exam help screen for Prostate Cancer.  Screening begins at age 33 with African-American men and at age 64 with Caucasian men.  Screening may begin sooner depending on your family history.  Take these medicines  Aspirin- One aspirin daily can help prevent Heart disease and Stroke.  Flu shot- Every fall.  Tetanus- Every 10 years.  Zostavax- Once after the age of 2 to prevent Shingles.  Pneumonia shot- Once after the age of 61; if you are younger than 20, ask your healthcare provider if you need a Pneumonia shot.  Take  these steps  Don't smoke- If you do smoke, talk to your doctor  about quitting.  For tips on how to quit, go to www.smokefree.gov or call 1-800-QUIT-NOW.  Be physically active- Exercise 5 days a week for at least 30 minutes.  If you are not already physically active start slow and gradually work up to 30 minutes of moderate physical activity.  Examples of moderate activity include walking briskly, mowing the yard, dancing, swimming, bicycling, etc.  Eat a healthy diet- Eat a variety of healthy food such as fruits, vegetables, low fat milk, low fat cheese, yogurt, lean meant, poultry, fish, beans, tofu, etc. For more information go to www.thenutritionsource.org  Drink alcohol in moderation- Limit alcohol intake to less than two drinks a day. Never drink and drive.  Dentist- Brush and floss twice daily; visit your dentist twice a year.  Depression- Your emotional health is as important as your physical health. If you're feeling down, or losing interest in things you would normally enjoy please talk to your healthcare provider.  Eye exam- Visit your eye doctor every year.  Safe sex- If you may be exposed to a sexually transmitted infection, use a condom.  Seat belts- Seat belts can save your life; always wear one.  Smoke/Carbon Monoxide detectors- These detectors need to be installed on the appropriate level of your home.  Replace batteries at least once a year.  Skin cancer- When out in the sun, cover up and use sunscreen 15 SPF or higher.  Violence- If anyone is threatening you, please tell your healthcare provider.  Living Will/ Health care power of attorney- Speak with your healthcare provider and family.  IF you received an x-ray today, you will receive an invoice from [facility]. Please contact [facility] Radiology at [phone] with questions or concerns regarding your invoice.   IF you received labwork today, you will receive an invoice from [facility]. Please contact [facility] at [phone] with questions or concerns regarding your  invoice.   Our billing staff will not be able to assist you with questions regarding bills from these companies.  You will be contacted with the lab results as soon as they are available. The fastest way to get your results is to activate your My Chart account. Instructions are located on the last page of this paperwork. If you have not heard from [place] regarding the results in 2 weeks, please contact this office.      I personally performed the services described in this documentation, which was scribed in my presence. The recorded information has been reviewed and considered for accuracy and completeness, addended by me as needed, and agree with information above.  Signed,   Merri Ray, MD [facility].  07/26/17 8:48 PM

## 2017-07-24 NOTE — Patient Instructions (Addendum)
If you feel that physical therapy did not help your symptoms, I would recommend meeting with Dr. Sherwood Gambler or pain management specialist. If referral is needed from me, let me know.   Start Cymbalta, and follow up in next 2 weeks. Please call one of the counselors to discuss your mood symptoms further. Let me know if you need other numbers.  Arvil Chaco, [place] [phone].  Vivia Budge: [phone]  Schedule eye care provider appointment for eye symptoms and screening.   Keeping you healthy  Get these tests  Blood pressure- Have your blood pressure checked once a year by your healthcare provider.  Normal blood pressure is 120/80  Weight- Have your body mass index (BMI) calculated to screen for obesity.  BMI is a measure of body fat based on height and weight. You can also calculate your own BMI at [employer].  Cholesterol- Have your cholesterol checked every year.  Diabetes- Have your blood sugar checked regularly if you have high blood pressure, high cholesterol, have a family history of diabetes or if you are overweight.  Screening for Colon Cancer- Colonoscopy starting at age 63.  Screening may begin sooner depending on your family history and other health conditions. Follow up colonoscopy as directed by your Gastroenterologist.  Screening for Prostate Cancer- Both blood work (PSA) and a rectal exam help screen for Prostate Cancer.  Screening begins at age 39 with African-American men and at age 39 with Caucasian men.  Screening may begin sooner depending on your family history.  Take these medicines  Aspirin- One aspirin daily can help prevent Heart disease and Stroke.  Flu shot- Every fall.  Tetanus- Every 10 years.  Zostavax- Once after the age of 47 to prevent Shingles.  Pneumonia shot- Once after the age of 69; if you are younger than 60, ask your healthcare provider if you need a Pneumonia shot.  Take these steps  Don't smoke- If you do smoke, talk to  your doctor about quitting.  For tips on how to quit, go to www.smokefree.gov or call 1-800-QUIT-NOW.  Be physically active- Exercise 5 days a week for at least 30 minutes.  If you are not already physically active start slow and gradually work up to 30 minutes of moderate physical activity.  Examples of moderate activity include walking briskly, mowing the yard, dancing, swimming, bicycling, etc.  Eat a healthy diet- Eat a variety of healthy food such as fruits, vegetables, low fat milk, low fat cheese, yogurt, lean meant, poultry, fish, beans, tofu, etc. For more information go to www.thenutritionsource.org  Drink alcohol in moderation- Limit alcohol intake to less than two drinks a day. Never drink and drive.  Dentist- Brush and floss twice daily; visit your dentist twice a year.  Depression- Your emotional health is as important as your physical health. If you're feeling down, or losing interest in things you would normally enjoy please talk to your healthcare provider.  Eye exam- Visit your eye doctor every year.  Safe sex- If you may be exposed to a sexually transmitted infection, use a condom.  Seat belts- Seat belts can save your life; always wear one.  Smoke/Carbon Monoxide detectors- These detectors need to be installed on the appropriate level of your home.  Replace batteries at least once a year.  Skin cancer- When out in the sun, cover up and use sunscreen 15 SPF or higher.  Violence- If anyone is threatening you, please tell your healthcare provider.  Living Will/ Health care power of attorney- Speak  with your healthcare provider and family.  IF you received an x-ray today, you will receive an invoice from [facility]. Please contact [facility] Radiology at [phone] with questions or concerns regarding your invoice.   IF you received labwork today, you will receive an invoice from [place]. Please contact [facility] at [phone] with questions or concerns  regarding your invoice.   Our billing staff will not be able to assist you with questions regarding bills from these companies.  You will be contacted with the lab results as soon as they are available. The fastest way to get your results is to activate your My Chart account. Instructions are located on the last page of this paperwork. If you have not heard from [place] regarding the results in 2 weeks, please contact this office.

## 2017-07-25 LAB — PSA: Prostate Specific Ag, Serum: 3.5 ng/mL (ref 0.0–4.0)

## 2017-07-27 ENCOUNTER — Ambulatory Visit: Payer: TRICARE For Life (TFL) | Admitting: Family Medicine

## 2017-07-28 ENCOUNTER — Telehealth: Payer: Self-pay | Admitting: Family Medicine

## 2017-07-28 NOTE — Telephone Encounter (Signed)
Copied from [place] [id number]. Topic: Referral - Question >> Jul 28, 2017 12:53 PM Synthia Innocent wrote: [facility] has been unable to reach patient to schedule appt

## 2017-08-07 ENCOUNTER — Ambulatory Visit: Payer: Medicare PPO | Admitting: Family Medicine

## 2017-08-14 ENCOUNTER — Ambulatory Visit (INDEPENDENT_AMBULATORY_CARE_PROVIDER_SITE_OTHER): Payer: Medicare PPO | Admitting: Family Medicine

## 2017-08-14 ENCOUNTER — Encounter: Payer: Self-pay | Admitting: Family Medicine

## 2017-08-14 ENCOUNTER — Other Ambulatory Visit: Payer: Self-pay

## 2017-08-14 VITALS — BP 126/74 | HR 65 | Temp 98.4°F | Resp 18 | Ht 66.34 in | Wt 224.2 lb

## 2017-08-14 DIAGNOSIS — R2232 Localized swelling, mass and lump, left upper limb: Secondary | ICD-10-CM | POA: Diagnosis not present

## 2017-08-14 DIAGNOSIS — F329 Major depressive disorder, single episode, unspecified: Secondary | ICD-10-CM | POA: Diagnosis not present

## 2017-08-14 DIAGNOSIS — G894 Chronic pain syndrome: Secondary | ICD-10-CM

## 2017-08-14 DIAGNOSIS — F1090 Alcohol use, unspecified, uncomplicated: Secondary | ICD-10-CM

## 2017-08-14 DIAGNOSIS — L729 Follicular cyst of the skin and subcutaneous tissue, unspecified: Secondary | ICD-10-CM

## 2017-08-14 DIAGNOSIS — Z789 Other specified health status: Secondary | ICD-10-CM

## 2017-08-14 DIAGNOSIS — Z7289 Other problems related to lifestyle: Secondary | ICD-10-CM

## 2017-08-14 MED ORDER — DULOXETINE HCL 30 MG PO CPEP: 30.0000 mg | ORAL_CAPSULE | Freq: Every day | ORAL | 2 refills | Status: DC

## 2017-08-14 NOTE — Progress Notes (Signed)
Subjective:  By signing my name below, I, [name], attest that this documentation has been prepared under the direction and in the presence of Wendie Agreste, MD Electronically Signed: Ladene Artist, ED Scribe 08/14/2017 at 10:49 AM.   Patient ID: Douglas Carpenter, male    DOB: 12-28-1955, 62 y.o.   MRN: [id number]  Chief Complaint  Patient presents with  . Follow-up    mood and discuss a medication getting filled at [place]   . Mass    bump on hand and scrotum    HPI Douglas Carpenter is a 62 y.o. male who presents to Primary Care at [facility] for f/u of multiple concerns from CPE 1/28.  Increased Irritability Thought to have some component of possible anxiety vs irritability form chronic pain syndrome vs component of depression as he was suffering from anhedonia last visit. Chronic back pain and migraines treated at [place]. Stopped pain management as he didn't want to continue narcotics. Neurosurgeon Dr. Sherwood Gambler. Had completed PT. Started Cymbalta 30 mg qd. Recommended f/u with neurosurgeon to decide next step vs pain management. Recommended counselors, numbers provided.  Pt has contacted counselors 3 times with the last call being 3 days ago. States he has not received a call back yet. States he has tried group counseling (~10 ppl) in the past through the [place] which often worsened his irritability/depression. [facility] for the [facility] to contact them with specifics of why Cymbalta was recommended in order for them to fill the medication. Also reports that Dr. Sherwood Gambler plans to "just make him comfortable". Pt stopped oxycodone since he didn't want to be addicted to narcotics. He is taking diclofenac 75 mg bid and Flexeril tid which is not taking the edge off. Denies SI/HI. Pt is drinking ~1 pint of alcohol/day to sleep but wife states pt has cut back a lot. Denies DUIs or addiction to alcohol. Denies having to have a drink in the morning but states that he does become irritated  only when his wife asks about his usage.  L Hand Nodule Evaluated 1/10. Nontender at that time. Differential included ganglion cyst or contracture but it was nontender and he had FROM. Information was sent to [facility] 1/16. Reports that the nodule on his L hand is unchanged, denies worsening pain. He has not received a call from the [place] yet.  Scrotal Cyst/Nodule Noticed 2 wks prior to 1/10 visit. Suspected epidural or sebaceous cyst. Recommended warm compress as initial approach. Reports that the cyst/nodule is still nontender and has not enlarged in size. Pt has not applied any warm compresses or tried any other treatments.  Patient Active Problem List   Diagnosis Date Noted  . Exercise-induced asthma 07/21/2014  . Chronic cough 07/21/2014  . Upper airway cough syndrome 07/21/2014  . Allergic rhinitis 07/21/2014  . Lumbar stenosis 11/06/2013  . Urethral stricture 01/07/2013  . Prostatic hypertrophy 11/12/2012  . VOCAL CORD DISORDER 02/19/2009  . HYPERLIPIDEMIA 01/27/2009  . Snoring 01/27/2009  . HYPERTENSION 01/27/2009  . CHRONIC RHINITIS 01/27/2009  . Asthma 01/27/2009  . GERD 01/27/2009   Past Medical History:  Diagnosis Date  . Arthritis    "everywhere"  . BPH (benign prostatic hypertrophy)   . Chronic rhinitis 01/27/2009  . ED (erectile dysfunction)   . GERD (gastroesophageal reflux disease)   . Headache(784.0)    migraines, as many as 3 in a week    . HYPERLIPIDEMIA 01/27/2009  . HYPERTENSION 01/27/2009  .  Mild asthma    seasonal allergies, uses Proair once per yr.   . Mild obstructive sleep apnea    per pt -- mild osa test result--  no rx cpap needed  . Testosterone deficiency   . Urethral stricture   . Vocal cord anomaly    pt states told from [place]  doctor heard a little gurgle sound -- no farther testing done   Past Surgical History:  Procedure Laterality Date  . CYSTOSCOPY WITH URETHRAL DILATATION N/A 01/07/2013   Procedure: CYSTOSCOPY WITH  URETHRAL DILATATION BALLOON DILATION ;  Surgeon: Bernestine Amass, MD;  Location: [facility];  Service: Urology;  Laterality: N/A;  . LUMBAR LAMINECTOMY/DECOMPRESSION MICRODISCECTOMY Right 11/29/2012   Procedure: Right Lumbar Four to Five Laminectomy/ Decompression Microdiskectomy;  Surgeon: Otilio Connors, MD;  Location: [place] [facility];  Service: Neurosurgery;  Laterality: Right;  LUMBAR LAMINECTOMY/DECOMPRESSION MICRODISCECTOMY 1 LEVEL  . SHOULDER ARTHROSCOPY W/ ACROMIAL REPAIR Left 08-28-2002   Allergies  Allergen Reactions  . Aspirin Rash    Pt can tolerate low doses-- INTOLERANT TO HIGHER DOSES   Prior to Admission medications   Medication Sig Start Date End Date Taking? Authorizing Provider  albuterol (PROAIR HFA) 108 (90 Base) MCG/ACT inhaler Inhale 2 puffs into the lungs every 6 (six) hours as needed for wheezing or shortness of breath. 10/05/16   [name], MD  aspirin EC 81 MG tablet Take 81 mg by mouth every morning.    [provider]  Aspirin-Acetaminophen-Caffeine (EXCEDRIN PO) Take 2 tablets by mouth 2 (two) times daily as needed (migraine).    [provider]  atenolol (TENORMIN) 25 MG tablet Take 25 mg by mouth every morning.     [provider]  atorvastatin (LIPITOR) 80 MG tablet Take 40 mg by mouth at bedtime.    [provider]  cyclobenzaprine (FLEXERIL) 10 MG tablet Take 0.5-1 tablets (5-10 mg total) by mouth 3 (three) times daily as needed for muscle spasms. 11/08/13   Jovita Gamma, MD  diclofenac (VOLTAREN) 75 MG EC tablet Take 75 mg by mouth 2 (two) times daily.     [provider]  DULoxetine (CYMBALTA) 30 MG capsule Take 1 capsule (30 mg total) by mouth daily. 07/24/17   Wendie Agreste, MD  fluticasone-salmeterol (ADVAIR HFA) [phone] MCG/ACT inhaler Inhale 2 puffs into the lungs 2 (two) times daily. 04/03/17   [name], MD  Ketotifen Fumarate (THERA TEARS ALLERGY OP) Place 1 drop into both eyes daily as  needed (dry eyes).    [provider]  montelukast (SINGULAIR) 10 MG tablet Take 1 tablet (10 mg total) by mouth at bedtime. 04/03/17   [name], MD  Omega-3 Fatty Acids (FISH OIL) 1000 MG CPDR Take 2,000 mg by mouth 2 (two) times daily.      [provider]  omeprazole (PRILOSEC) 20 MG capsule Take 20 mg by mouth daily.     [provider]  topiramate (TOPAMAX) 25 MG tablet Take 25 mg by mouth daily.     [provider]   Social History   Socioeconomic History  . Marital status: Married    Spouse name: Not on file  . Number of children: Not on file  . Years of education: Not on file  . Highest education level: Not on file  Social Needs  . Financial resource strain: Not on file  . Food insecurity - worry: Not on file  . Food insecurity - inability: Not on file  .  Transportation needs - medical: Not on file  . Transportation needs - non-medical: Not on file  Occupational History  . Occupation: retired  Tobacco Use  . Smoking status: Former Smoker    Packs/day: 0.50    Years: 22.00    Pack years: 11.00    Types: Cigarettes    Last attempt to quit: 06/27/1988    Years since quitting: 29.1  . Smokeless tobacco: Never Used  Substance and Sexual Activity  . Alcohol use: Yes    Alcohol/week: 1.8 oz    Types: 3 Standard drinks or equivalent per week    Comment: 4-5 drinks / day- cognac   . Drug use: No  . Sexual activity: Not on file  Other Topics Concern  . Not on file  Social History Narrative  . Not on file   Review of Systems  Genitourinary:       + Scrotal nodule  Skin:       + Nodule to L hand  Psychiatric/Behavioral: Positive for agitation and dysphoric mood. Negative for suicidal ideas.      Objective:   Physical Exam  Constitutional: He is oriented to person, place, and time. He appears well-developed and well-nourished. No distress.  HENT:  Head: Normocephalic and atraumatic.  Eyes: Conjunctivae and EOM are normal.  Neck:  Neck supple. No tracheal deviation present.  Cardiovascular: Normal rate and regular rhythm.  Pulmonary/Chest: Effort normal and breath sounds normal. No respiratory distress.  Musculoskeletal: Normal range of motion.  FROM of fingers. There is a small nodular area on volar L hand below the 4th MCP.  Neurological: He is alert and oriented to person, place, and time.  Skin: Skin is warm and dry.  Psychiatric: He has a normal mood and affect. His behavior is normal.  Nursing note and vitals reviewed.  Vitals:   08/14/17 1029  BP: 126/74  Pulse: 65  Resp: 18  Temp: 98.4 F (36.9 C)  TempSrc: Oral  SpO2: 98%  Weight: 224 lb 3.2 oz (101.7 kg)  Height: 5' 6.34" (1.685 m)      Assessment & Plan:   Douglas Carpenter is a 62 y.o. male Reactive depression - Plan: DULoxetine (CYMBALTA) 30 MG capsule Chronic pain syndrome  - Suspect Cymbalta will help with both chronic pain and depression. Paper prescription given to fill at [facility] along with letter indicating reason.  -Cut back on alcohol as depressant. Denies addiction,  -difficulty obtaining previous appointment with therapist, new numbers provided  Nodule of finger of left hand  -Stable, but still would like for him to meet with specialist. hand eval pending. Provided phone number if needed for appointment scheduling  Cyst of scrotum  -Denies changes. Based on last exam, possible sebaceous/epidermal cyst. Monitor for any increased size, redness, discharge or worsening. RTC precautions  Alcohol use  -Discussed cutting back as above.  Meds ordered this encounter  Medications  . DULoxetine (CYMBALTA) 30 MG capsule    Sig: Take 1 capsule (30 mg total) by mouth daily. For 1 week, then increase to 2 capsules by mouth daily.    Dispense:  60 capsule    Refill:  2   Patient Instructions   Counselors: Barron Schmid - [facility] JIRCV:[phone] [facility]: [phone] [facility]:  [place] - [phone]   - we referred you to this practice for your hand nodule.    IF you received an x-ray today, you will receive an invoice from [facility].  Please contact [facility] at [phone] with questions or concerns regarding your invoice.   IF you received labwork today, you will receive an invoice from [place]. Please contact [facility] at [phone] with questions or concerns regarding your invoice.   Our billing staff will not be able to assist you with questions regarding bills from these companies.  You will be contacted with the lab results as soon as they are available. The fastest way to get your results is to activate your My Chart account. Instructions are located on the last page of this paperwork. If you have not heard from [place] regarding the results in 2 weeks, please contact this office.     I personally performed the services described in this documentation, which was scribed in my presence. The recorded information has been reviewed and considered for accuracy and completeness, addended by me as needed, and agree with information above.  Signed,   Merri Ray, MD Primary Care at [place].  08/16/17 3:34 PM

## 2017-08-14 NOTE — Patient Instructions (Addendum)
Counselors: Barron Schmid - [facility] SFKCL:[phone] [facility]: [phone] [facility]: [place] - [phone]   - we referred you to this practice for your hand nodule.    IF you received an x-ray today, you will receive an invoice from [facility]. Please contact [facility] at [phone] with questions or concerns regarding your invoice.   IF you received labwork today, you will receive an invoice from [place]. Please contact [facility] at [phone] with questions or concerns regarding your invoice.   Our billing staff will not be able to assist you with questions regarding bills from these companies.  You will be contacted with the lab results as soon as they are available. The fastest way to get your results is to activate your My Chart account. Instructions are located on the last page of this paperwork. If you have not heard from [place] regarding the results in 2 weeks, please contact this office.

## 2017-08-16 ENCOUNTER — Encounter: Payer: Self-pay | Admitting: Family Medicine

## 2017-08-29 DIAGNOSIS — M76821 Posterior tibial tendinitis, right leg: Secondary | ICD-10-CM | POA: Diagnosis not present

## 2017-08-29 DIAGNOSIS — M71571 Other bursitis, not elsewhere classified, right ankle and foot: Secondary | ICD-10-CM | POA: Diagnosis not present

## 2017-10-02 ENCOUNTER — Encounter: Payer: Self-pay | Admitting: Pulmonary Disease

## 2017-10-02 ENCOUNTER — Ambulatory Visit (INDEPENDENT_AMBULATORY_CARE_PROVIDER_SITE_OTHER): Payer: Medicare PPO | Admitting: Pulmonary Disease

## 2017-10-02 VITALS — BP 130/78 | HR 77 | Ht 67.0 in | Wt 225.0 lb

## 2017-10-02 DIAGNOSIS — J4540 Moderate persistent asthma, uncomplicated: Secondary | ICD-10-CM

## 2017-10-02 DIAGNOSIS — J301 Allergic rhinitis due to pollen: Secondary | ICD-10-CM

## 2017-10-02 DIAGNOSIS — H1013 Acute atopic conjunctivitis, bilateral: Secondary | ICD-10-CM | POA: Diagnosis not present

## 2017-10-02 NOTE — Patient Instructions (Addendum)
Try using claritin daily during allergy season If still having problems then you can add flonase If your eye symptoms persist then you can try using pataday eye drops  Follow up in 6 months

## 2017-10-02 NOTE — Progress Notes (Signed)
[place] [facility]  Chief Complaint  Patient presents with  . Follow-up    Pt states with the pollen getting heavier outside, pt's throat is beginning to bother him    Vital signs: BP 130/78 (BP Location: Left Arm, Cuff Size: Normal)   Pulse 77   Ht 5\' 7"  (1.702 m)   Wt 225 lb (102.1 kg)   SpO2 96%   BMI 35.24 kg/m   History of Present Illness: Douglas Carpenter is a 62 y.o. male former smoker with chronic cough in setting of Allergic asthma, Post nasal drip, GERD, and vocal cord disease.  He also has hx of snoring.  He has noticed more eye irritation, runny nose, scratchy throat with change of seasons.  He clears his throat.  Not having cough, or wheeze.  Not feeling short of breath.  No fever, skin rash.  Reflux controlled.  Sleeping okay, but snoring worse with allergies.  Not using flonase or claritin.   Physical Exam:  General - pleasant Eyes - pupils reactive ENT - no sinus tenderness, no oral exudate, no LAN Cardiac - regular, no murmur Chest - no wheeze, rales Abd - soft, non tender Ext - no edema Skin - no rashes Neuro - normal strength Psych - normal mood  Assessment/Plan:  Allergic asthma with exercise induced bronchoconstriction. - continue advair HFA, singulair and prn albuterol - discussed option of biologic agent if his symptoms progress  Upper airway cough syndrome. - worse with increase in allergens this time of year - advised him to use clariitn - if symptoms persist, then add flonase - continue singulair  Allergic conjunctivitis. - he can try adding pataday if his eye symptoms don't improve with claritin  GERD. - prilosec per PCP  Snoring. - discussed importance of maintaining his weight - explained how worsening allergies can contribute to more snoring   Patient Instructions  Try using claritin daily during allergy season If still having problems then you can add flonase If your eye symptoms persist  then you can try using pataday eye drops  Follow up in 6 months    [name], MD [place] 10/02/2017, 10:20 AM  Flow Sheet  Pulmonary tests: HST 03/07/13 >> AHI 3.9, SaO2 low 80% PFT 02/19/09 >> FEV1 2.90 (90%), FEV1% 70, TLC 5.62(93%), DLCO 99%, +BD PFT 07/21/14 >> FEV1 2.70 (93%), FEV1% 74, TLC 5.97 (91%), RV 2.65 (126%), DLCO 106%, +BD RAST 07/21/14 >> react to dust mites, grasses; IgE 49 FeNO 10/27/15 >> 228  Past Medical History: He  has a past medical history of Arthritis, BPH (benign prostatic hypertrophy), Chronic rhinitis (01/27/2009), ED (erectile dysfunction), GERD (gastroesophageal reflux disease), Headache(784.0), HYPERLIPIDEMIA (01/27/2009), HYPERTENSION (01/27/2009), Mild asthma, Mild obstructive sleep apnea, Testosterone deficiency, Urethral stricture, and Vocal cord anomaly.  Past Surgical History: He  has a past surgical history that includes Lumbar laminectomy/decompression microdiscectomy (Right, 11/29/2012); Shoulder arthroscopy w/ acromial repair (Left, 08-28-2002); and Cystoscopy with urethral dilatation (N/A, 01/07/2013).  Family History: His family history includes Cancer in his maternal grandfather and mother; Heart disease in his maternal grandfather and maternal grandmother.  Social History: He  reports that he quit smoking about 29 years ago. His smoking use included cigarettes. He has a 11.00 pack-year smoking history. He has never used smokeless tobacco. He reports that he drinks about 1.8 oz of alcohol per week. He reports that he does not use drugs.  Medications: Allergies as of 10/02/2017      Reactions  Aspirin Rash   Pt can tolerate low doses-- INTOLERANT TO HIGHER DOSES      Medication List        Accurate as of 10/02/17 10:20 AM. Always use your most recent med list.          albuterol 108 (90 Base) MCG/ACT inhaler Commonly known as:  PROAIR HFA Inhale 2 puffs into the lungs every 6 (six) hours as needed for wheezing or  shortness of breath.   aspirin EC 81 MG tablet Take 81 mg by mouth every morning.   atenolol 25 MG tablet Commonly known as:  TENORMIN Take 25 mg by mouth every morning.   atorvastatin 80 MG tablet Commonly known as:  LIPITOR Take 40 mg by mouth at bedtime.   cyclobenzaprine 10 MG tablet Commonly known as:  FLEXERIL Take 0.5-1 tablets (5-10 mg total) by mouth 3 (three) times daily as needed for muscle spasms.   diclofenac 75 MG EC tablet Commonly known as:  VOLTAREN Take 75 mg by mouth 2 (two) times daily.   DULoxetine 30 MG capsule Commonly known as:  CYMBALTA Take 1 capsule (30 mg total) by mouth daily. For 1 week, then increase to 2 capsules by mouth daily.   EXCEDRIN PO Take 2 tablets by mouth 2 (two) times daily as needed (migraine).   Fish Oil 1000 MG Cpdr Take 2,000 mg by mouth 2 (two) times daily.   fluticasone-salmeterol 230-21 MCG/ACT inhaler Commonly known as:  ADVAIR HFA Inhale 2 puffs into the lungs 2 (two) times daily.   montelukast 10 MG tablet Commonly known as:  SINGULAIR Take 1 tablet (10 mg total) by mouth at bedtime.   omeprazole 20 MG capsule Commonly known as:  PRILOSEC Take 20 mg by mouth daily.   THERA TEARS ALLERGY OP Place 1 drop into both eyes daily as needed (dry eyes).   topiramate 25 MG tablet Commonly known as:  TOPAMAX Take 25 mg by mouth daily.

## 2017-11-02 DIAGNOSIS — M48061 Spinal stenosis, lumbar region without neurogenic claudication: Secondary | ICD-10-CM | POA: Diagnosis not present

## 2017-11-02 DIAGNOSIS — M4726 Other spondylosis with radiculopathy, lumbar region: Secondary | ICD-10-CM | POA: Diagnosis not present

## 2017-11-02 DIAGNOSIS — M5116 Intervertebral disc disorders with radiculopathy, lumbar region: Secondary | ICD-10-CM | POA: Diagnosis not present

## 2017-11-13 ENCOUNTER — Ambulatory Visit (INDEPENDENT_AMBULATORY_CARE_PROVIDER_SITE_OTHER): Payer: Medicare PPO | Admitting: Family Medicine

## 2017-11-13 ENCOUNTER — Encounter: Payer: Self-pay | Admitting: Family Medicine

## 2017-11-13 VITALS — BP 122/76 | HR 76 | Temp 97.6°F | Ht 67.0 in | Wt 224.4 lb

## 2017-11-13 DIAGNOSIS — L237 Allergic contact dermatitis due to plants, except food: Secondary | ICD-10-CM

## 2017-11-13 DIAGNOSIS — F329 Major depressive disorder, single episode, unspecified: Secondary | ICD-10-CM | POA: Diagnosis not present

## 2017-11-13 DIAGNOSIS — R21 Rash and other nonspecific skin eruption: Secondary | ICD-10-CM

## 2017-11-13 MED ORDER — METHYLPREDNISOLONE ACETATE 80 MG/ML IJ SUSP
80.0000 mg | Freq: Once | INTRAMUSCULAR | Status: AC
  Administered 2017-11-13: 80 mg via INTRAMUSCULAR

## 2017-11-13 MED ORDER — DULOXETINE HCL 60 MG PO CPEP: 60.0000 mg | ORAL_CAPSULE | Freq: Every day | ORAL | 1 refills | Status: DC

## 2017-11-13 MED ORDER — PREDNISONE 20 MG PO TABS: ORAL_TABLET | ORAL | 0 refills | Status: DC

## 2017-11-13 NOTE — Patient Instructions (Addendum)
Continue Cymbalta, but I changed that to the 60 mg dose, so only take 1 pill/day.  Keep follow-up with therapist.   For poison ivy I did give you an injection of steroid today, but additionally start prednisone as instructed. Okay to continue tecnu to areas other than the face.  Do not take the diclofenac while you are taking prednisone.  Once you have completed the prednisone taper, you can restart your diclofenac.  Return to the clinic or go to the nearest emergency room if any of your symptoms worsen or new symptoms occur.   Poison Ivy Dermatitis Poison ivy dermatitis is inflammation of the skin that is caused by the allergens on the leaves of the poison ivy plant. The skin reaction often involves redness, swelling, blisters, and extreme itching. What are the causes? This condition is caused by a specific chemical (urushiol) found in the sap of the poison ivy plant. This chemical is sticky and can be easily spread to people, animals, and objects. You can get poison ivy dermatitis by:  Having direct contact with a poison ivy plant.  Touching animals, other people, or objects that have come in contact with poison ivy and have the chemical on them.  What increases the risk? This condition is more likely to develop in:  People who are outdoors often.  People who go outdoors without wearing protective clothing, such as closed shoes, long pants, and a long-sleeved shirt.  What are the signs or symptoms? Symptoms of this condition include:  Redness and itching.  A rash that often includes bumps and blisters. The rash usually appears 48 hours after exposure.  Swelling. This may occur if the reaction is more severe.  Symptoms usually last for 1-2 weeks. However, the first time you develop this condition, symptoms may last 3-4 weeks. How is this diagnosed? This condition may be diagnosed based on your symptoms and a physical exam. Your health care provider may also ask you about any  recent outdoor activity. How is this treated? Treatment for this condition will vary depending on how severe it is. Treatment may include:  Hydrocortisone creams or calamine lotions to relieve itching.  Oatmeal baths to soothe the skin.  Over-the-counter antihistamine tablets.  Oral steroid medicine for more severe outbreaks.  Follow these instructions at home:  Take or apply over-the-counter and prescription medicines only as told by your health care provider.  Wash exposed skin as soon as possible with soap and cold water.  Use hydrocortisone creams or calamine lotion as needed to soothe the skin and relieve itching.  Take oatmeal baths as needed. Use colloidal oatmeal. You can get this at your local pharmacy or grocery store. Follow the instructions on the packaging.  Do not scratch or rub your skin.  While you have the rash, wash clothes right after you wear them. How is this prevented?  Learn to identify the poison ivy plant and avoid contact with the plant. This plant can be recognized by the number of leaves. Generally, poison ivy has three leaves with flowering branches on a single stem. The leaves are typically glossy, and they have jagged edges that come to a point at the front.  If you have been exposed to poison ivy, thoroughly wash with soap and water right away. You have about 30 minutes to remove the plant resin before it will cause the rash. Be sure to wash under your fingernails because any plant resin there will continue to spread the rash.  When hiking or  camping, wear clothes that will help you to avoid exposure on the skin. This includes long pants, a long-sleeved shirt, tall socks, and hiking boots. You can also apply preventive lotion to your skin to help limit exposure.  If you suspect that your clothes or outdoor gear came in contact with poison ivy, rinse them off outside with a garden hose before you bring them inside your house. Contact a health care  provider if:  You have open sores in the rash area.  You have more redness, swelling, or pain in the affected area.  You have redness that spreads beyond the rash area.  You have fluid, blood, or pus coming from the affected area.  You have a fever.  You have a rash over a large area of your body.  You have a rash on your eyes, mouth, or genitals.  Your rash does not improve after a few days. Get help right away if:  Your face swells or your eyes swell shut.  You have trouble breathing.  You have trouble swallowing. This information is not intended to replace advice given to you by your health care provider. Make sure you discuss any questions you have with your health care provider. Document Released: 06/10/2000 Document Revised: 11/19/2015 Document Reviewed: 11/19/2014 Elsevier Interactive Patient Education  2018 [employer].   IF you received an x-ray today, you will receive an invoice from [facility]. Please contact [facility] at [phone] with questions or concerns regarding your invoice.   IF you received labwork today, you will receive an invoice from [place]. Please contact [facility] at [phone] with questions or concerns regarding your invoice.   Our billing staff will not be able to assist you with questions regarding bills from these companies.  You will be contacted with the lab results as soon as they are available. The fastest way to get your results is to activate your My Chart account. Instructions are located on the last page of this paperwork. If you have not heard from [place] regarding the results in 2 weeks, please contact this office.

## 2017-11-13 NOTE — Progress Notes (Signed)
Subjective:  By signing my name below, I, Douglas Carpenter, attest that this documentation has been prepared under the direction and in the presence of Douglas Ray, MD. Electronically Signed: Moises Carpenter, [place]. 11/13/2017 , 10:32 AM .  Patient was seen in Room 10 .   Patient ID: Douglas Carpenter, male    DOB: [birth year], 62 y.o.   MRN: [id number] Chief Complaint  Patient presents with  . Chonic Condition    3 month f/u (PHQ 9 = 4)  . Posin Ivy    Posin ivy outbreat on hands, face, neck and arms.   HPI Douglas Carpenter is a 62 y.o. male  Here for poison ivy and follow up on depression.   Poison ivy Patient initially noticed rash started 2 days ago. He had taken all the hedges down, but noticed his dog has rolled in a small 3 inch root with a few leaves. He believes he rubbed it off from his dog. He first noticed rash on his hands and around his eyes. He's tried OTC tecnu and calamine.   Depression Screening Depression screen [facility] 2/9 11/13/2017 08/14/2017 07/24/2017 07/06/2017 04/19/2017  Decreased Interest 0 0 0 0 0  Down, Depressed, Hopeless 1 0 0 0 0  PHQ - 2 Score 1 0 0 0 0  Altered sleeping 0 - - - -  Tired, decreased energy 1 - - - -  Change in appetite 0 - - - -  Feeling bad or failure about yourself  0 - - - -  Trouble concentrating 1 - - - -  Moving slowly or fidgety/restless 1 - - - -  Suicidal thoughts 0 - - - -  PHQ-9 Score 4 - - - -  Difficult doing work/chores Somewhat difficult - - - -   Discussed in February, thought to have reactive depression with chronic pain syndrome. Recommended meeting with therapist, cutting back on alcohol. He was started on Cymbalta 30mg  initially, then increased to 60mg  total per day. He denies SI or hallucinations. He is seeing a psychiatrist at the [place], up to twice a week.   Patient Active Problem List   Diagnosis Date Noted  . Exercise-induced asthma 07/21/2014  . Chronic cough 07/21/2014  . Upper airway cough syndrome 07/21/2014  .  Allergic rhinitis 07/21/2014  . Lumbar stenosis 11/06/2013  . Urethral stricture 01/07/2013  . Prostatic hypertrophy 11/12/2012  . VOCAL CORD DISORDER 02/19/2009  . HYPERLIPIDEMIA 01/27/2009  . Snoring 01/27/2009  . HYPERTENSION 01/27/2009  . CHRONIC RHINITIS 01/27/2009  . Asthma 01/27/2009  . GERD 01/27/2009   Past Medical History:  Diagnosis Date  . Arthritis    "everywhere"  . BPH (benign prostatic hypertrophy)   . Chronic rhinitis 01/27/2009  . ED (erectile dysfunction)   . GERD (gastroesophageal reflux disease)   . Headache(784.0)    migraines, as many as 3 in a week    . HYPERLIPIDEMIA 01/27/2009  . HYPERTENSION 01/27/2009  . Mild asthma    seasonal allergies, uses Proair once per yr.   . Mild obstructive sleep apnea    per pt -- mild osa test result--  no rx cpap needed  . Testosterone deficiency   . Urethral stricture   . Vocal cord anomaly    pt states told from [place]  doctor heard a little gurgle sound -- no farther testing done   Past Surgical History:  Procedure Laterality Date  . CYSTOSCOPY WITH URETHRAL DILATATION N/A 01/07/2013   Procedure: CYSTOSCOPY WITH URETHRAL DILATATION  BALLOON DILATION ;  Surgeon: Bernestine Amass, MD;  Location: [facility];  Service: Urology;  Laterality: N/A;  . LUMBAR LAMINECTOMY/DECOMPRESSION MICRODISCECTOMY Right 11/29/2012   Procedure: Right Lumbar Four to Five Laminectomy/ Decompression Microdiskectomy;  Surgeon: Otilio Connors, MD;  Location: [facility];  Service: Neurosurgery;  Laterality: Right;  LUMBAR LAMINECTOMY/DECOMPRESSION MICRODISCECTOMY 1 LEVEL  . SHOULDER ARTHROSCOPY W/ ACROMIAL REPAIR Left 08-28-2002   Allergies  Allergen Reactions  . Aspirin Rash    Pt can tolerate low doses-- INTOLERANT TO HIGHER DOSES   Prior to Admission medications   Medication Sig Start Date End Date Taking? Authorizing Provider  albuterol (PROAIR HFA) 108 (90 Base) MCG/ACT inhaler Inhale 2 puffs into the lungs every 6 (six) hours as  needed for wheezing or shortness of breath. 10/05/16  Yes [name], MD  aspirin EC 81 MG tablet Take 81 mg by mouth every morning.   Yes [provider]  Aspirin-Acetaminophen-Caffeine (EXCEDRIN PO) Take 2 tablets by mouth 2 (two) times daily as needed (migraine).   Yes [provider]  atenolol (TENORMIN) 25 MG tablet Take 25 mg by mouth every morning.    Yes [provider]  atorvastatin (LIPITOR) 80 MG tablet Take 40 mg by mouth at bedtime.   Yes [provider]  cyclobenzaprine (FLEXERIL) 10 MG tablet Take 0.5-1 tablets (5-10 mg total) by mouth 3 (three) times daily as needed for muscle spasms. 11/08/13  Yes Jovita Gamma, MD  diclofenac (VOLTAREN) 75 MG EC tablet Take 75 mg by mouth 2 (two) times daily.    Yes [provider]  DULoxetine (CYMBALTA) 30 MG capsule Take 1 capsule (30 mg total) by mouth daily. For 1 week, then increase to 2 capsules by mouth daily. 08/14/17  Yes Wendie Agreste, MD  fluticasone-salmeterol (ADVAIR HFA) [phone] MCG/ACT inhaler Inhale 2 puffs into the lungs 2 (two) times daily. 04/03/17  Yes [name], MD  Ketotifen Fumarate (THERA TEARS ALLERGY OP) Place 1 drop into both eyes daily as needed (dry eyes).   Yes [provider]  montelukast (SINGULAIR) 10 MG tablet Take 1 tablet (10 mg total) by mouth at bedtime. 04/03/17  Yes [name], MD  Omega-3 Fatty Acids (FISH OIL) 1000 MG CPDR Take 2,000 mg by mouth 2 (two) times daily.     Yes [provider]  omeprazole (PRILOSEC) 20 MG capsule Take 20 mg by mouth daily.    Yes [provider]  topiramate (TOPAMAX) 25 MG tablet Take 25 mg by mouth daily.    Yes [provider]   Social History   Socioeconomic History  . Marital status: Married    Spouse name: Not on file  . Number of children: Not on file  . Years of education: Not on file  . Highest education level: Not on file  Occupational History  . Occupation: retired  Photographer  . Financial resource strain: Not on file  . Food insecurity:    Worry: Not on file    Inability: Not on file  . Transportation needs:    Medical: Not on file    Non-medical: Not on file  Tobacco Use  . Smoking status: Former Smoker    Packs/day: 0.50    Years: 22.00    Pack years: 11.00    Types: Cigarettes    Last attempt to quit: 06/27/1988    Years since quitting: 29.4  . Smokeless tobacco: Never Used  Substance and Sexual Activity  .  Alcohol use: Yes    Alcohol/week: 1.8 oz    Types: 3 Standard drinks or equivalent per week    Comment: 4-5 drinks / day- cognac   . Drug use: No  . Sexual activity: Not on file  Lifestyle  . Physical activity:    Days per week: Not on file    Minutes per session: Not on file  . Stress: Not on file  Relationships  . Social connections:    Talks on phone: Not on file    Gets together: Not on file    Attends religious service: Not on file    Active member of club or organization: Not on file    Attends meetings of clubs or organizations: Not on file    Relationship status: Not on file  . Intimate partner violence:    Fear of current or ex partner: Not on file    Emotionally abused: Not on file    Physically abused: Not on file    Forced sexual activity: Not on file  Other Topics Concern  . Not on file  Social History Narrative  . Not on file   Review of Systems  Constitutional: Negative for fatigue and unexpected weight change.  Eyes: Negative for visual disturbance.  Respiratory: Negative for cough, chest tightness and shortness of breath.   Cardiovascular: Negative for chest pain, palpitations and leg swelling.  Gastrointestinal: Negative for abdominal pain and Carpenter in stool.  Skin: Positive for rash.  Neurological: Negative for dizziness, light-headedness and headaches.  Psychiatric/Behavioral: Negative for hallucinations, sleep disturbance and suicidal ideas.       Objective:   Physical Exam  Constitutional: He is  oriented to person, place, and time. He appears well-developed and well-nourished. No distress.  HENT:  Head: Normocephalic and atraumatic.  Eyes: Pupils are equal, round, and reactive to light. EOM are normal.  Neck: Neck supple.  Cardiovascular: Normal rate.  Pulmonary/Chest: Effort normal. No respiratory distress.  Musculoskeletal: Normal range of motion.  Neurological: He is alert and oriented to person, place, and time.  Skin: Skin is warm and dry.  Left forearm: scattered erythematous papules Right hand: 2 small patches of linear vesicles with slight erythema Neck: right neck with few small patches Face: few small erythematous patches right cheek, few patches below the left lip and right chin, left cheek has some erythematous faint vesicles, left upper and below eyelid swollen with erythematous patches, more erythematous patches between eyebrows on his forehead, right upper eyelid has linear patches as well Chest: very erythematous patch over upper chest  Psychiatric: He has a normal mood and affect. His behavior is normal.  Nursing note and vitals reviewed.   Vitals:   11/13/17 1006  BP: 122/76  Pulse: 76  Temp: 97.6 F (36.4 C)  TempSrc: Oral  SpO2: 100%  Weight: 224 lb 6.4 oz (101.8 kg)  Height: 5\' 7"  (1.702 m)       Assessment & Plan:    KEY CEN is a 62 y.o. male Contact dermatitis due to poison ivy - Plan: predniSONE (DELTASONE) 20 MG tablet, methylPREDNISolone acetate (DEPO-MEDROL) injection 80 mg Rash of face - Plan: predniSONE (DELTASONE) 20 MG tablet, methylPREDNISolone acetate (DEPO-MEDROL) injection 80 mg  -Rash typical appearance of contact dermatitis from poison ivy.  Significant involvement of face including around his eyes.  Injection of Depo-Medrol 80 mg, start prednisone taper, potential side effects of both discussed as well as RTC precautions.  Handout given.  Reactive depression - Plan: DULoxetine (  CYMBALTA) 60 MG capsule  Improved, tolerating  Cymbalta at 60 mg/day dosing, change to 78m capsule, continue therapy,and  recheck within the next 3 to 6 months.   Meds ordered this encounter  Medications  . predniSONE (DELTASONE) 20 MG tablet    Sig: 3 by mouth for 3 days, then 2 by mouth for 2 days, then 1 by mouth for 2 days, then 1/2 by mouth for 2 days.    Dispense:  16 tablet    Refill:  0  . DULoxetine (CYMBALTA) 60 MG capsule    Sig: Take 1 capsule (60 mg total) by mouth daily.    Dispense:  90 capsule    Refill:  1  . methylPREDNISolone acetate (DEPO-MEDROL) injection 80 mg   Patient Instructions    Continue Cymbalta, but I changed that to the 60 mg dose, so only take 1 pill/day.  Keep follow-up with therapist.   For poison ivy I did give you an injection of steroid today, but additionally start prednisone as instructed. Okay to continue tecnu to areas other than the face.  Do not take the diclofenac while you are taking prednisone.  Once you have completed the prednisone taper, you can restart your diclofenac.  Return to the clinic or go to the nearest emergency room if any of your symptoms worsen or new symptoms occur.   Poison Ivy Dermatitis Poison ivy dermatitis is inflammation of the skin that is caused by the allergens on the leaves of the poison ivy plant. The skin reaction often involves redness, swelling, blisters, and extreme itching. What are the causes? This condition is caused by a specific chemical (urushiol) found in the sap of the poison ivy plant. This chemical is sticky and can be easily spread to people, animals, and objects. You can get poison ivy dermatitis by:  Having direct contact with a poison ivy plant.  Touching animals, other people, or objects that have come in contact with poison ivy and have the chemical on them.  What increases the risk? This condition is more likely to develop in:  People who are outdoors often.  People who go outdoors without wearing protective clothing, such as  closed shoes, long pants, and a long-sleeved shirt.  What are the signs or symptoms? Symptoms of this condition include:  Redness and itching.  A rash that often includes bumps and blisters. The rash usually appears 48 hours after exposure.  Swelling. This may occur if the reaction is more severe.  Symptoms usually last for 1-2 weeks. However, the first time you develop this condition, symptoms may last 3-4 weeks. How is this diagnosed? This condition may be diagnosed based on your symptoms and a physical exam. Your health care provider may also ask you about any recent outdoor activity. How is this treated? Treatment for this condition will vary depending on how severe it is. Treatment may include:  Hydrocortisone creams or calamine lotions to relieve itching.  Oatmeal baths to soothe the skin.  Over-the-counter antihistamine tablets.  Oral steroid medicine for more severe outbreaks.  Follow these instructions at home:  Take or apply over-the-counter and prescription medicines only as told by your health care provider.  Wash exposed skin as soon as possible with soap and cold water.  Use hydrocortisone creams or calamine lotion as needed to soothe the skin and relieve itching.  Take oatmeal baths as needed. Use colloidal oatmeal. You can get this at your local pharmacy or grocery store. Follow the instructions on the packaging.  Do not scratch or rub your skin.  While you have the rash, wash clothes right after you wear them. How is this prevented?  Learn to identify the poison ivy plant and avoid contact with the plant. This plant can be recognized by the number of leaves. Generally, poison ivy has three leaves with flowering branches on a single stem. The leaves are typically glossy, and they have jagged edges that come to a point at the front.  If you have been exposed to poison ivy, thoroughly wash with soap and water right away. You have about 30 minutes to remove the  plant resin before it will cause the rash. Be sure to wash under your fingernails because any plant resin there will continue to spread the rash.  When hiking or camping, wear clothes that will help you to avoid exposure on the skin. This includes long pants, a long-sleeved shirt, tall socks, and hiking boots. You can also apply preventive lotion to your skin to help limit exposure.  If you suspect that your clothes or outdoor gear came in contact with poison ivy, rinse them off outside with a garden hose before you bring them inside your house. Contact a health care provider if:  You have open sores in the rash area.  You have more redness, swelling, or pain in the affected area.  You have redness that spreads beyond the rash area.  You have fluid, Carpenter, or pus coming from the affected area.  You have a fever.  You have a rash over a large area of your body.  You have a rash on your eyes, mouth, or genitals.  Your rash does not improve after a few days. Get help right away if:  Your face swells or your eyes swell shut.  You have trouble breathing.  You have trouble swallowing. This information is not intended to replace advice given to you by your health care provider. Make sure you discuss any questions you have with your health care provider. Document Released: 06/10/2000 Document Revised: 11/19/2015 Document Reviewed: 11/19/2014 Elsevier Interactive Patient Education  2018 [employer].   IF you received an x-Carpenter today, you will receive an invoice from [facility]. Please contact [facility] at [phone] with questions or concerns regarding your invoice.   IF you received labwork today, you will receive an invoice from [place]. Please contact [facility] at [phone] with questions or concerns regarding your invoice.   Our billing staff will not be able to assist you with questions regarding bills from these companies.  You will be contacted with  the lab results as soon as they are available. The fastest way to get your results is to activate your My Chart account. Instructions are located on the last page of this paperwork. If you have not heard from [place] regarding the results in 2 weeks, please contact this office.       I personally performed the services described in this documentation, which was scribed in my presence. The recorded information has been reviewed and considered for accuracy and completeness, addended by me as needed, and agree with information above.  Signed,   Douglas Ray, MD [facility] [place].  11/13/17 10:42 AM

## 2017-12-25 ENCOUNTER — Telehealth: Payer: Self-pay | Admitting: *Deleted

## 2017-12-25 ENCOUNTER — Other Ambulatory Visit: Payer: Self-pay | Admitting: Family Medicine

## 2017-12-25 DIAGNOSIS — L237 Allergic contact dermatitis due to plants, except food: Secondary | ICD-10-CM

## 2017-12-25 DIAGNOSIS — R21 Rash and other nonspecific skin eruption: Secondary | ICD-10-CM

## 2017-12-25 NOTE — Telephone Encounter (Signed)
Call to patient- patient is requesting a refill of prednisone for poison ivy. He thought that they had cleaned it all up- he can only figure that he got it from his dog. It is on his L arm now. Told patient I would put his request in- we would let him know if he needs an appointment.

## 2017-12-25 NOTE — Telephone Encounter (Signed)
Copied from [place] [id number]. Topic: Quick Communication - Rx Refill/Question >> Dec 25, 2017  8:12 AM Synthia Innocent wrote: Medication: predniSONE (DELTASONE) 20 MG tablet, has poison ivy again  Has the patient contacted their pharmacy? Yes.   (Agent: If no, request that the patient contact the pharmacy for the refill.) (Agent: If yes, when and what did the pharmacy advise?)  Preferred Pharmacy (with phone number or street name): [facility] on [address]  Agent: Please be advised that RX refills may take up to 3 business days. We ask that you follow-up with your pharmacy. >> Dec 25, 2017  1:37 PM Ahmed Prima L wrote: Patient's wife would like to know does he need to come back in or will the prednisone be called in for him?

## 2017-12-25 NOTE — Telephone Encounter (Signed)
Advised patient he will need an appointment

## 2017-12-25 NOTE — Telephone Encounter (Signed)
Copied from [id number]. Topic: Quick Communication - Rx Refill/Question >> Dec 25, 2017  8:12 AM Synthia Innocent wrote: Medication: predniSONE (DELTASONE) 20 MG tablet, has poison ivy again  Has the patient contacted their pharmacy? Yes.   (Agent: If no, request that the patient contact the pharmacy for the refill.) (Agent: If yes, when and what did the pharmacy advise?)  Preferred Pharmacy (with phone number or street name): [facility] on [address]  Agent: Please be advised that RX refills may take up to 3 business days. We ask that you follow-up with your pharmacy.

## 2017-12-26 ENCOUNTER — Encounter: Payer: Self-pay | Admitting: Urgent Care

## 2017-12-26 ENCOUNTER — Ambulatory Visit (INDEPENDENT_AMBULATORY_CARE_PROVIDER_SITE_OTHER): Payer: Medicare PPO | Admitting: Urgent Care

## 2017-12-26 VITALS — BP 144/79 | HR 80 | Temp 98.0°F | Resp 17 | Ht 67.0 in | Wt 230.0 lb

## 2017-12-26 DIAGNOSIS — L237 Allergic contact dermatitis due to plants, except food: Secondary | ICD-10-CM

## 2017-12-26 DIAGNOSIS — L299 Pruritus, unspecified: Secondary | ICD-10-CM | POA: Diagnosis not present

## 2017-12-26 MED ORDER — HYDROXYZINE HCL 25 MG PO TABS
12.5000 mg | ORAL_TABLET | Freq: Three times a day (TID) | ORAL | 0 refills | Status: DC | PRN
Start: 2017-12-26 — End: 2018-10-12

## 2017-12-26 MED ORDER — METHYLPREDNISOLONE ACETATE 80 MG/ML IJ SUSP
40.0000 mg | Freq: Once | INTRAMUSCULAR | Status: AC
  Administered 2017-12-26: 40 mg via INTRAMUSCULAR

## 2017-12-26 MED ORDER — PREDNISONE 10 MG PO TABS: ORAL_TABLET | ORAL | 0 refills | Status: DC

## 2017-12-26 NOTE — Patient Instructions (Addendum)
Prednisone Day 1-5: Take 6 tablets daily. Day 6-10: Take 4 tablets daily. Day 11-15: Take 2 tablets daily. Take tablets with breakfast.    Do not take any other NSAID including diclofenac, ibuprofen, naproxen, Aleve, Motrin, etc while taking prednisone.      Poison Ivy Dermatitis Poison ivy dermatitis is inflammation of the skin that is caused by the allergens on the leaves of the poison ivy plant. The skin reaction often involves redness, swelling, blisters, and extreme itching. What are the causes? This condition is caused by a specific chemical (urushiol) found in the sap of the poison ivy plant. This chemical is sticky and can be easily spread to people, animals, and objects. You can get poison ivy dermatitis by:  Having direct contact with a poison ivy plant.  Touching animals, other people, or objects that have come in contact with poison ivy and have the chemical on them.  What increases the risk? This condition is more likely to develop in:  People who are outdoors often.  People who go outdoors without wearing protective clothing, such as closed shoes, long pants, and a long-sleeved shirt.  What are the signs or symptoms? Symptoms of this condition include:  Redness and itching.  A rash that often includes bumps and blisters. The rash usually appears 48 hours after exposure.  Swelling. This may occur if the reaction is more severe.  Symptoms usually last for 1-2 weeks. However, the first time you develop this condition, symptoms may last 3-4 weeks. How is this diagnosed? This condition may be diagnosed based on your symptoms and a physical exam. Your health care provider may also ask you about any recent outdoor activity. How is this treated? Treatment for this condition will vary depending on how severe it is. Treatment may include:  Hydrocortisone creams or calamine lotions to relieve itching.  Oatmeal baths to soothe the skin.  Over-the-counter antihistamine  tablets.  Oral steroid medicine for more severe outbreaks.  Follow these instructions at home:  Take or apply over-the-counter and prescription medicines only as told by your health care provider.  Wash exposed skin as soon as possible with soap and cold water.  Use hydrocortisone creams or calamine lotion as needed to soothe the skin and relieve itching.  Take oatmeal baths as needed. Use colloidal oatmeal. You can get this at your local pharmacy or grocery store. Follow the instructions on the packaging.  Do not scratch or rub your skin.  While you have the rash, wash clothes right after you wear them. How is this prevented?  Learn to identify the poison ivy plant and avoid contact with the plant. This plant can be recognized by the number of leaves. Generally, poison ivy has three leaves with flowering branches on a single stem. The leaves are typically glossy, and they have jagged edges that come to a point at the front.  If you have been exposed to poison ivy, thoroughly wash with soap and water right away. You have about 30 minutes to remove the plant resin before it will cause the rash. Be sure to wash under your fingernails because any plant resin there will continue to spread the rash.  When hiking or camping, wear clothes that will help you to avoid exposure on the skin. This includes long pants, a long-sleeved shirt, tall socks, and hiking boots. You can also apply preventive lotion to your skin to help limit exposure.  If you suspect that your clothes or outdoor gear came in contact with  poison ivy, rinse them off outside with a garden hose before you bring them inside your house. Contact a health care provider if:  You have open sores in the rash area.  You have more redness, swelling, or pain in the affected area.  You have redness that spreads beyond the rash area.  You have fluid, blood, or pus coming from the affected area.  You have a fever.  You have a rash  over a large area of your body.  You have a rash on your eyes, mouth, or genitals.  Your rash does not improve after a few days. Get help right away if:  Your face swells or your eyes swell shut.  You have trouble breathing.  You have trouble swallowing. This information is not intended to replace advice given to you by your health care provider. Make sure you discuss any questions you have with your health care provider. Document Released: 06/10/2000 Document Revised: 11/19/2015 Document Reviewed: 11/19/2014 Elsevier Interactive Patient Education  2018 [employer].     IF you received an x-ray today, you will receive an invoice from [facility]. Please contact [facility] at [phone] with questions or concerns regarding your invoice.   IF you received labwork today, you will receive an invoice from [place]. Please contact [facility] at [phone] with questions or concerns regarding your invoice.   Our billing staff will not be able to assist you with questions regarding bills from these companies.  You will be contacted with the lab results as soon as they are available. The fastest way to get your results is to activate your My Chart account. Instructions are located on the last page of this paperwork. If you have not heard from [place] regarding the results in 2 weeks, please contact this office.

## 2017-12-26 NOTE — Progress Notes (Signed)
    MRN: [id number] DOB: [birth year]  Subjective:   Douglas Carpenter is a 62 y.o. male presenting for 3-day history of worsening rash over his arms and lower legs.  Rash is now spreading to both sides of his face and upper part of his chest.  Patient is having a recurrence of poison ivy dermatitis, reports that he was working out in the yard again this past Sunday and his symptoms started up again.  He has tried Benadryl and calamine lotion with minimal relief.  Patient did have another episode on 11/13/2017.  This resolved with a good steroid course, both injectable and oral.  He tried to clear the poison ivy but noted that it is still round.  Denies fever, facial swelling, chest tightness, shortness of breath, wheezing, nausea, vomiting, belly pain.  Douglas Carpenter has a current medication list which includes the following prescription(s): albuterol, aspirin ec, aspirin-acetaminophen-caffeine, atenolol, atorvastatin, cyclobenzaprine, diclofenac, duloxetine, fluticasone-salmeterol, ketotifen fumarate, montelukast, fish oil, omeprazole, prednisone, and topiramate, and the following Facility-Administered Medications: albuterol. Also is allergic to aspirin.  Douglas Carpenter  has a past medical history of Arthritis, BPH (benign prostatic hypertrophy), Chronic rhinitis (01/27/2009), ED (erectile dysfunction), GERD (gastroesophageal reflux disease), Headache(784.0), HYPERLIPIDEMIA (01/27/2009), HYPERTENSION (01/27/2009), Mild asthma, Mild obstructive sleep apnea, Testosterone deficiency, Urethral stricture, and Vocal cord anomaly. Also  has a past surgical history that includes Lumbar laminectomy/decompression microdiscectomy (Right, 11/29/2012); Shoulder arthroscopy w/ acromial repair (Left, 08-28-2002); and Cystoscopy with urethral dilatation (N/A, 01/07/2013).  Objective:   Vitals: BP (!) 144/79   Pulse 80   Temp 98 F (36.7 C) (Oral)   Resp 17   Ht 5\' 7"  (1.702 m)   Wt 230 lb (104.3 kg)   SpO2 98%   BMI 36.02 kg/m   Physical  Exam  Constitutional: He is oriented to person, place, and time. He appears well-developed and well-nourished.  HENT:  Mouth/Throat: Oropharynx is clear and moist.  No facial swelling.  Eyes: Right eye exhibits no discharge. Left eye exhibits no discharge. No scleral icterus.  Cardiovascular: Normal rate, regular rhythm and intact distal pulses. Exam reveals no gallop and no friction rub.  No murmur heard. Pulmonary/Chest: No respiratory distress. He has no wheezes. He has no rales.  Neurological: He is alert and oriented to person, place, and time.  Skin: Skin is warm and dry. Rash noted.  Diffusely scattered urticarial lesions with associated excoriations over both his forearms bilaterally, either side of his face extending toward the eyes.  He also has a similar lesions over his lower legs.   Assessment and Plan :   Poison ivy dermatitis - Plan: methylPREDNISolone acetate (DEPO-MEDROL) injection 40 mg  Itching  Patient recently received an IM injection of steroids and counseled that it may be excessive to use this again 1.5 months later.  However, he insisted on using it given his previous bad reaction to poison ivy.  I agreed to do 40 mg of IM Depo-Medrol in addition to a 15-day course of prednisone.  Discussed potential for side effects with patient, he verbalized understanding.  Return to clinic precautions discussed.  [name], PA-C [facility] [phone] 12/26/2017 12:12 PM

## 2018-01-11 ENCOUNTER — Ambulatory Visit: Payer: Medicare PPO | Admitting: Family Medicine

## 2018-03-13 ENCOUNTER — Telehealth: Payer: Self-pay | Admitting: Gastroenterology

## 2018-03-19 NOTE — Telephone Encounter (Signed)
2008  adenomatous polyps. 2014 hyperplastic polyp.[facility]. Ok to schedule direct colonoscopy. Thanks

## 2018-03-26 NOTE — Telephone Encounter (Signed)
Called and LM on vmail to call back to schedule 

## 2018-04-03 ENCOUNTER — Ambulatory Visit (INDEPENDENT_AMBULATORY_CARE_PROVIDER_SITE_OTHER): Payer: Medicare PPO | Admitting: Pulmonary Disease

## 2018-04-03 ENCOUNTER — Encounter: Payer: Self-pay | Admitting: Pulmonary Disease

## 2018-04-03 VITALS — BP 124/82 | HR 92 | Ht 67.0 in | Wt 228.0 lb

## 2018-04-03 DIAGNOSIS — Z23 Encounter for immunization: Secondary | ICD-10-CM

## 2018-04-03 DIAGNOSIS — J301 Allergic rhinitis due to pollen: Secondary | ICD-10-CM

## 2018-04-03 DIAGNOSIS — J4540 Moderate persistent asthma, uncomplicated: Secondary | ICD-10-CM | POA: Diagnosis not present

## 2018-04-03 NOTE — Patient Instructions (Signed)
Flu shot today  Uses advair twice per day   Follow up in 6 months

## 2018-04-03 NOTE — Progress Notes (Signed)
[facility]  Chief Complaint  Patient presents with  . Follow-up    Pt has increase of wheezing and SOB with exertion in last 2 months. Pt is requesting flu shot today.    Vital signs: BP 124/82 (BP Location: Left Arm, Cuff Size: Normal)   Pulse 92   Ht 5\' 7"  (1.702 m)   Wt 228 lb (103.4 kg)   SpO2 97%   BMI 35.71 kg/m   History of Present Illness: Douglas Carpenter is a 62 y.o. male former smoker with chronic cough in setting of Allergic asthma, Post nasal drip, GERD, and vocal cord disease.  He also has hx of snoring.  He doesn't use advair on a regular basis.  He gets episodes of wheeze and shortness of breath.  This improves when he uses advair.  Not having sinus congestion, sore throat, chest pain, fever, skin rash, leg swelling, or hemoptysis.  His wife has noticed more snoring and shallow breathing when he doesn't use advair.   Physical Exam:  General - alert Eyes - pupils reactive ENT - no sinus tenderness, no stridor Cardiac - regular rate/rhythm, no murmur Chest - equal breath sounds b/l, no wheezing or rales Abdomen - soft, non tender Extremities - no cyanosis, clubbing, or edema Skin - no rashes Lymphatics - no lymphadenopathy Neuro - CN intact, normal strength Psych - normal mood and behavior   Assessment/Plan:  Allergic asthma with exercise induced bronchoconstriction. - advised him to use advair bid - continue singulair and prn albuterol - flu shot today - [facility] is on formulary  Upper airway cough syndrome. - continue singulair and claritin  Allergic conjunctivitis. - prn pataday  Snoring. - reassess after his asthma is under better control   Patient Instructions  Flu shot today  Uses advair twice per day   Follow up in 6 months    [name], MD [facility] 04/03/2018, 10:34 AM  Flow Sheet  Pulmonary tests: HST 03/07/13 >> AHI 3.9, SaO2  low 80% PFT 02/19/09 >> FEV1 2.90 (90%), FEV1% 70, TLC 5.62(93%), DLCO 99%, +BD PFT 07/21/14 >> FEV1 2.70 (93%), FEV1% 74, TLC 5.97 (91%), RV 2.65 (126%), DLCO 106%, +BD RAST 07/21/14 >> react to dust mites, grasses; IgE 49 FeNO 10/27/15 >> 228  Past Medical History: He  has a past medical history of Arthritis, BPH (benign prostatic hypertrophy), Chronic rhinitis (01/27/2009), ED (erectile dysfunction), GERD (gastroesophageal reflux disease), Headache(784.0), HYPERLIPIDEMIA (01/27/2009), HYPERTENSION (01/27/2009), Mild asthma, Mild obstructive sleep apnea, Testosterone deficiency, Urethral stricture, and Vocal cord anomaly.  Past Surgical History: He  has a past surgical history that includes Lumbar laminectomy/decompression microdiscectomy (Right, 11/29/2012); Shoulder arthroscopy w/ acromial repair (Left, 08-28-2002); and Cystoscopy with urethral dilatation (N/A, 01/07/2013).  Family History: His family history includes Cancer in his maternal grandfather and mother; Heart disease in his maternal grandfather and maternal grandmother.  Social History: He  reports that he quit smoking about 29 years ago. His smoking use included cigarettes. He has a 11.00 pack-year smoking history. He has never used smokeless tobacco. He reports that he drinks about 3.0 standard drinks of alcohol per week. He reports that he does not use drugs.  Medications: Allergies as of 04/03/2018      Reactions   Aspirin Rash   Pt can tolerate low doses-- INTOLERANT TO HIGHER DOSES      Medication List        Accurate as of  04/03/18 10:34 AM. Always use your most recent med list.          albuterol 108 (90 Base) MCG/ACT inhaler Commonly known as:  PROVENTIL HFA;VENTOLIN HFA Inhale 2 puffs into the lungs every 6 (six) hours as needed for wheezing or shortness of breath.   aspirin EC 81 MG tablet Take 81 mg by mouth every morning.   atenolol 25 MG tablet Commonly known as:  TENORMIN Take 25 mg by mouth every morning.     atorvastatin 80 MG tablet Commonly known as:  LIPITOR Take 40 mg by mouth at bedtime.   cyclobenzaprine 10 MG tablet Commonly known as:  FLEXERIL Take 0.5-1 tablets (5-10 mg total) by mouth 3 (three) times daily as needed for muscle spasms.   diclofenac 75 MG EC tablet Commonly known as:  VOLTAREN Take 75 mg by mouth 2 (two) times daily.   DULoxetine 60 MG capsule Commonly known as:  CYMBALTA Take 1 capsule (60 mg total) by mouth daily.   EXCEDRIN PO Take 2 tablets by mouth 2 (two) times daily as needed (migraine).   Fish Oil 1000 MG Cpdr Take 2,000 mg by mouth 2 (two) times daily.   fluticasone-salmeterol 230-21 MCG/ACT inhaler Commonly known as:  ADVAIR HFA Inhale 2 puffs into the lungs 2 (two) times daily.   hydrOXYzine 25 MG tablet Commonly known as:  ATARAX/VISTARIL Take 0.5-1 tablets (12.5-25 mg total) by mouth every 8 (eight) hours as needed for itching.   montelukast 10 MG tablet Commonly known as:  SINGULAIR Take 1 tablet (10 mg total) by mouth at bedtime.   omeprazole 20 MG capsule Commonly known as:  PRILOSEC Take 20 mg by mouth daily.   THERA TEARS ALLERGY OP Place 1 drop into both eyes daily as needed (dry eyes).   topiramate 25 MG tablet Commonly known as:  TOPAMAX Take 25 mg by mouth daily.

## 2018-04-05 ENCOUNTER — Ambulatory Visit: Payer: TRICARE For Life (TFL) | Admitting: Pulmonary Disease

## 2018-04-17 ENCOUNTER — Telehealth: Payer: Self-pay | Admitting: Pulmonary Disease

## 2018-04-17 MED ORDER — FLUTICASONE-SALMETEROL 230-21 MCG/ACT IN AERO: 2.0000 | INHALATION_SPRAY | Freq: Two times a day (BID) | RESPIRATORY_TRACT | 11 refills | Status: DC

## 2018-04-17 NOTE — Telephone Encounter (Signed)
Spoke with patient's wife. She was calling to check on status of patient's Advair 230-21 inhaler. Patient gets this medication from the [facility] in [place]. Advised her that it was printed on 10/8 but I am not sure where the RX was sent. I have printed another copy of the RX and will send it to the [facility]. She verbalized understanding. Nothing further needed at time of call.

## 2018-04-18 ENCOUNTER — Ambulatory Visit (INDEPENDENT_AMBULATORY_CARE_PROVIDER_SITE_OTHER): Payer: Medicare PPO | Admitting: Family Medicine

## 2018-04-18 ENCOUNTER — Other Ambulatory Visit: Payer: Self-pay | Admitting: Pulmonary Disease

## 2018-04-18 DIAGNOSIS — Z23 Encounter for immunization: Secondary | ICD-10-CM

## 2018-04-18 DIAGNOSIS — R0602 Shortness of breath: Secondary | ICD-10-CM

## 2018-04-18 MED ORDER — ALBUTEROL SULFATE HFA 108 (90 BASE) MCG/ACT IN AERS: 2.0000 | INHALATION_SPRAY | Freq: Four times a day (QID) | RESPIRATORY_TRACT | 3 refills | Status: DC | PRN

## 2018-04-18 MED ORDER — FLUTICASONE-SALMETEROL 230-21 MCG/ACT IN AERO: 2.0000 | INHALATION_SPRAY | Freq: Two times a day (BID) | RESPIRATORY_TRACT | 11 refills | Status: DC

## 2018-04-25 ENCOUNTER — Encounter: Payer: Self-pay | Admitting: Gastroenterology

## 2018-05-03 ENCOUNTER — Ambulatory Visit (INDEPENDENT_AMBULATORY_CARE_PROVIDER_SITE_OTHER): Payer: Medicare PPO

## 2018-05-03 DIAGNOSIS — Z23 Encounter for immunization: Secondary | ICD-10-CM | POA: Diagnosis not present

## 2018-05-21 ENCOUNTER — Ambulatory Visit (AMBULATORY_SURGERY_CENTER): Payer: Self-pay | Admitting: *Deleted

## 2018-05-21 VITALS — Ht 67.0 in | Wt 231.0 lb

## 2018-05-21 DIAGNOSIS — Z86010 Personal history of colonic polyps: Secondary | ICD-10-CM

## 2018-05-21 MED ORDER — MOVIPREP 100 G PO SOLR: 1.0000 | Freq: Once | ORAL | 0 refills | Status: AC

## 2018-05-21 NOTE — Progress Notes (Signed)
No egg or soy allergy known to patient  No issues with past sedation with any surgeries  or procedures, no intubation problems  No diet pills per patient No home 02 use per patient  No blood thinners per patient  Pt denies issues with constipation  No A fib or A flutter  Pt has a movi prep at home the [facility] mailed him- will give movi instructions

## 2018-05-31 DIAGNOSIS — M47816 Spondylosis without myelopathy or radiculopathy, lumbar region: Secondary | ICD-10-CM | POA: Diagnosis not present

## 2018-05-31 DIAGNOSIS — Q7649 Other congenital malformations of spine, not associated with scoliosis: Secondary | ICD-10-CM | POA: Diagnosis not present

## 2018-05-31 DIAGNOSIS — M5136 Other intervertebral disc degeneration, lumbar region: Secondary | ICD-10-CM | POA: Diagnosis not present

## 2018-05-31 DIAGNOSIS — M5126 Other intervertebral disc displacement, lumbar region: Secondary | ICD-10-CM | POA: Diagnosis not present

## 2018-06-01 DIAGNOSIS — M5136 Other intervertebral disc degeneration, lumbar region: Secondary | ICD-10-CM | POA: Diagnosis not present

## 2018-06-01 DIAGNOSIS — M48061 Spinal stenosis, lumbar region without neurogenic claudication: Secondary | ICD-10-CM | POA: Diagnosis not present

## 2018-06-01 DIAGNOSIS — M4726 Other spondylosis with radiculopathy, lumbar region: Secondary | ICD-10-CM | POA: Diagnosis not present

## 2018-06-04 ENCOUNTER — Encounter: Payer: Self-pay | Admitting: Gastroenterology

## 2018-06-04 ENCOUNTER — Ambulatory Visit (AMBULATORY_SURGERY_CENTER): Payer: Medicare PPO | Admitting: Gastroenterology

## 2018-06-04 VITALS — BP 128/78 | HR 60 | Temp 97.8°F | Resp 13 | Ht 67.0 in | Wt 231.0 lb

## 2018-06-04 DIAGNOSIS — K635 Polyp of colon: Secondary | ICD-10-CM | POA: Diagnosis not present

## 2018-06-04 DIAGNOSIS — Z86010 Personal history of colonic polyps: Secondary | ICD-10-CM

## 2018-06-04 DIAGNOSIS — I10 Essential (primary) hypertension: Secondary | ICD-10-CM | POA: Diagnosis not present

## 2018-06-04 DIAGNOSIS — D123 Benign neoplasm of transverse colon: Secondary | ICD-10-CM | POA: Diagnosis not present

## 2018-06-04 DIAGNOSIS — D122 Benign neoplasm of ascending colon: Secondary | ICD-10-CM

## 2018-06-04 DIAGNOSIS — D128 Benign neoplasm of rectum: Secondary | ICD-10-CM

## 2018-06-04 DIAGNOSIS — K621 Rectal polyp: Secondary | ICD-10-CM

## 2018-06-04 DIAGNOSIS — D129 Benign neoplasm of anus and anal canal: Secondary | ICD-10-CM

## 2018-06-04 DIAGNOSIS — J45909 Unspecified asthma, uncomplicated: Secondary | ICD-10-CM | POA: Diagnosis not present

## 2018-06-04 MED ORDER — SODIUM CHLORIDE 0.9 % IV SOLN: 500.0000 mL | Freq: Once | INTRAVENOUS | Status: DC

## 2018-06-04 NOTE — Progress Notes (Signed)
Report to PACU RN; VSS. ?

## 2018-06-04 NOTE — Progress Notes (Signed)
Called to room to assist during endoscopic procedure.  Patient ID and intended procedure confirmed with present staff. Received instructions for my participation in the procedure from the performing physician.  

## 2018-06-04 NOTE — Op Note (Signed)
[facility] Patient Name: Douglas Carpenter Procedure Date: 06/04/2018 11:58 AM MRN: [id number] Endoscopist: Mauri Pole , MD Age: 62 Referring MD:  Date of Birth: 07-18-1955 Gender: Male Account #: [id number] Procedure:                Colonoscopy Indications:              High risk colon cancer surveillance: Personal                            history of colonic polyps, Last colonoscopy: 2009,                            Last colonoscopy: 2014 Medicines:                Monitored Anesthesia Care Procedure:                Pre-Anesthesia Assessment:                           - Prior to the procedure, a History and Physical                            was performed, and patient medications and                            allergies were reviewed. The patient's tolerance of                            previous anesthesia was also reviewed. The risks                            and benefits of the procedure and the sedation                            options and risks were discussed with the patient.                            All questions were answered, and informed consent                            was obtained. Prior Anticoagulants: The patient has                            taken no previous anticoagulant or antiplatelet                            agents. ASA Grade Assessment: II - A patient with                            mild systemic disease. After reviewing the risks                            and benefits, the patient was deemed in  satisfactory condition to undergo the procedure.                           After obtaining informed consent, the colonoscope                            was passed under direct vision. Throughout the                            procedure, the patient's blood pressure, pulse, and                            oxygen saturations were monitored continuously. The                            Model PCF-H190DL [id number]) scope  was introduced                            through the anus and advanced to the the cecum,                            identified by appendiceal orifice and ileocecal                            valve. The colonoscopy was performed without                            difficulty. The patient tolerated the procedure                            well. The quality of the bowel preparation was                            adequate. The ileocecal valve, appendiceal orifice,                            and rectum were photographed. Scope In: 12:05:35 PM Scope Out: 12:23:10 PM Scope Withdrawal Time: 0 hours 15 minutes 51 seconds  Total Procedure Duration: 0 hours 17 minutes 35 seconds  Findings:                 The perianal and digital rectal examinations were                            normal.                           A 8 mm polyp was found in the ascending colon. The                            polyp was sessile. The polyp was removed with a                            cold snare. Resection and retrieval were complete.  Two sessile polyps were found in the rectum and                            transverse colon. The polyps were 1 to 2 mm in                            size. These polyps were removed with a cold biopsy                            forceps. Resection and retrieval were complete.                           Scattered small-mouthed diverticula were found in                            the sigmoid colon and descending colon.                           Non-bleeding internal hemorrhoids were found during                            retroflexion. The hemorrhoids were small. Complications:            No immediate complications. Estimated Blood Loss:     Estimated blood loss was minimal. Impression:               - One 8 mm polyp in the ascending colon, removed                            with a cold snare. Resected and retrieved.                           - Two 1 to 2 mm polyps in  the rectum and in the                            transverse colon, removed with a cold biopsy                            forceps. Resected and retrieved.                           - Diverticulosis in the sigmoid colon and in the                            descending colon.                           - Non-bleeding internal hemorrhoids. Recommendation:           - Patient has a contact number available for                            emergencies. The signs and symptoms of potential  delayed complications were discussed with the                            patient. Return to normal activities tomorrow.                            Written discharge instructions were provided to the                            patient.                           - Resume previous diet.                           - Continue present medications.                           - Await pathology results.                           - Repeat colonoscopy in 3 - 5 years for                            surveillance based on pathology results. Mauri Pole, MD 06/04/2018 12:27:35 PM This report has been signed electronically.

## 2018-06-04 NOTE — Patient Instructions (Signed)
   Information on polyps ,diverticulosis,& hemorrhoids given to you today  Await pathology results on polyps removed.YOU HAD AN ENDOSCOPIC PROCEDURE TODAY AT [facility]:   Refer to the procedure report that was given to you for any specific questions about what was found during the examination.  If the procedure report does not answer your questions, please call your gastroenterologist to clarify.  If you requested that your care partner not be given the details of your procedure findings, then the procedure report has been included in a sealed envelope for you to review at your convenience later.  YOU SHOULD EXPECT: Some feelings of bloating in the abdomen. Passage of more gas than usual.  Walking can help get rid of the air that was put into your GI tract during the procedure and reduce the bloating. If you had a lower endoscopy (such as a colonoscopy or flexible sigmoidoscopy) you may notice spotting of blood in your stool or on the toilet paper. If you underwent a bowel prep for your procedure, you may not have a normal bowel movement for a few days.  Please Note:  You might notice some irritation and congestion in your nose or some drainage.  This is from the oxygen used during your procedure.  There is no need for concern and it should clear up in a day or so.  SYMPTOMS TO REPORT IMMEDIATELY:   Following lower endoscopy (colonoscopy or flexible sigmoidoscopy):  Excessive amounts of blood in the stool  Significant tenderness or worsening of abdominal pains  Swelling of the abdomen that is new, acute  Fever of 100F or higher  For urgent or emergent issues, a gastroenterologist can be reached at any hour by calling [phone].   DIET:  We do recommend a small meal at first, but then you may proceed to your regular diet.  Drink plenty of fluids but you should avoid alcoholic beverages for 24 hours.  MEDICATIONS:  Continue present medications.  Please see handouts  given to you by your recovery nurse.  ACTIVITY:  You should plan to take it easy for the rest of today and you should NOT DRIVE or use heavy machinery until tomorrow (because of the sedation medicines used during the test).    FOLLOW UP: Our staff will call the number listed on your records the next business day following your procedure to check on you and address any questions or concerns that you may have regarding the information given to you following your procedure. If we do not reach you, we will leave a message.  However, if you are feeling well and you are not experiencing any problems, there is no need to return our call.  We will assume that you have returned to your regular daily activities without incident.  If any biopsies were taken you will be contacted by phone or by letter within the next 1-3 weeks.  Please call us at [phone] if you have not heard about the biopsies in 3 weeks.   Thank you for allowing [place] to provide for your healthcare needs today.  SIGNATURES/CONFIDENTIALITY: You and/or your care partner have signed paperwork which will be entered into your electronic medical record.  These signatures attest to the fact that that the information above on your After Visit Summary has been reviewed and is understood.  Full responsibility of the confidentiality of this discharge information lies with you and/or your care-partner.

## 2018-06-04 NOTE — Progress Notes (Signed)
Pt's states no medical or surgical changes since previsit or office visit. 

## 2018-06-05 ENCOUNTER — Telehealth: Payer: Self-pay

## 2018-06-05 NOTE — Telephone Encounter (Signed)
No answer, unable to leave message °

## 2018-06-05 NOTE — Telephone Encounter (Signed)
NO ANSWER, MESSAGE LEFT FOR PATIENT. 

## 2018-06-11 ENCOUNTER — Encounter: Payer: Self-pay | Admitting: Gastroenterology

## 2018-06-15 DIAGNOSIS — M47816 Spondylosis without myelopathy or radiculopathy, lumbar region: Secondary | ICD-10-CM | POA: Diagnosis not present

## 2018-06-15 DIAGNOSIS — M5136 Other intervertebral disc degeneration, lumbar region: Secondary | ICD-10-CM | POA: Diagnosis not present

## 2018-06-15 DIAGNOSIS — M545 Low back pain: Secondary | ICD-10-CM | POA: Diagnosis not present

## 2018-06-15 DIAGNOSIS — G8220 Paraplegia, unspecified: Secondary | ICD-10-CM | POA: Diagnosis not present

## 2018-06-15 DIAGNOSIS — M546 Pain in thoracic spine: Secondary | ICD-10-CM | POA: Diagnosis not present

## 2018-06-15 DIAGNOSIS — Z981 Arthrodesis status: Secondary | ICD-10-CM | POA: Diagnosis not present

## 2018-06-15 DIAGNOSIS — R29898 Other symptoms and signs involving the musculoskeletal system: Secondary | ICD-10-CM | POA: Diagnosis not present

## 2018-06-15 DIAGNOSIS — Q7649 Other congenital malformations of spine, not associated with scoliosis: Secondary | ICD-10-CM | POA: Diagnosis not present

## 2018-06-15 DIAGNOSIS — Z6836 Body mass index (BMI) 36.0-36.9, adult: Secondary | ICD-10-CM | POA: Diagnosis not present

## 2018-06-26 DIAGNOSIS — M48061 Spinal stenosis, lumbar region without neurogenic claudication: Secondary | ICD-10-CM | POA: Diagnosis not present

## 2018-06-26 DIAGNOSIS — M5124 Other intervertebral disc displacement, thoracic region: Secondary | ICD-10-CM | POA: Diagnosis not present

## 2018-06-26 DIAGNOSIS — R29898 Other symptoms and signs involving the musculoskeletal system: Secondary | ICD-10-CM | POA: Diagnosis not present

## 2018-06-28 DIAGNOSIS — Z6836 Body mass index (BMI) 36.0-36.9, adult: Secondary | ICD-10-CM | POA: Diagnosis not present

## 2018-06-28 DIAGNOSIS — M546 Pain in thoracic spine: Secondary | ICD-10-CM | POA: Diagnosis not present

## 2018-06-28 DIAGNOSIS — M47816 Spondylosis without myelopathy or radiculopathy, lumbar region: Secondary | ICD-10-CM | POA: Diagnosis not present

## 2018-06-28 DIAGNOSIS — M545 Low back pain: Secondary | ICD-10-CM | POA: Diagnosis not present

## 2018-06-28 DIAGNOSIS — I10 Essential (primary) hypertension: Secondary | ICD-10-CM | POA: Diagnosis not present

## 2018-06-28 DIAGNOSIS — Z981 Arthrodesis status: Secondary | ICD-10-CM | POA: Diagnosis not present

## 2018-06-28 DIAGNOSIS — Q7649 Other congenital malformations of spine, not associated with scoliosis: Secondary | ICD-10-CM | POA: Diagnosis not present

## 2018-06-28 DIAGNOSIS — M5136 Other intervertebral disc degeneration, lumbar region: Secondary | ICD-10-CM | POA: Diagnosis not present

## 2018-07-04 ENCOUNTER — Other Ambulatory Visit: Payer: Self-pay

## 2018-07-04 ENCOUNTER — Ambulatory Visit: Payer: Medicare PPO | Attending: Neurosurgery | Admitting: Physical Therapy

## 2018-07-04 ENCOUNTER — Encounter: Payer: Self-pay | Admitting: Physical Therapy

## 2018-07-04 DIAGNOSIS — M546 Pain in thoracic spine: Secondary | ICD-10-CM | POA: Diagnosis not present

## 2018-07-04 DIAGNOSIS — M62830 Muscle spasm of back: Secondary | ICD-10-CM | POA: Insufficient documentation

## 2018-07-04 NOTE — Patient Instructions (Signed)

## 2018-07-04 NOTE — Therapy (Signed)
[facility] [address], [place], [place], [address] Phone: [phone]   Fax:  [phone]  Physical Therapy Evaluation  Patient Details  Name: Douglas Carpenter MRN: [id number] Date of Birth: 03-Jun-1956 Referring Provider (PT): Dr. Jovita Gamma   Encounter Date: 07/04/2018  PT End of Session - 07/04/18 1119    Visit Number  1    Date for PT Re-Evaluation  08/01/18    PT Start Time  7425    PT Stop Time  1115    PT Time Calculation (min)  60 min    Activity Tolerance  Patient tolerated treatment well    Behavior During Therapy  [facility] for tasks assessed/performed       Past Medical History:  Diagnosis Date  . Allergy   . Arthritis    "everywhere"  . BPH (benign prostatic hypertrophy)   . Chronic rhinitis 01/27/2009  . ED (erectile dysfunction)   . GERD (gastroesophageal reflux disease)   . Headache(784.0)    migraines, as many as 3 in a week    . HYPERLIPIDEMIA 01/27/2009  . HYPERTENSION 01/27/2009  . Mild asthma    seasonal allergies, uses Proair once per yr.   . Mild obstructive sleep apnea    per pt -- mild osa test result--  no rx cpap needed  . Neuromuscular disorder ([place])   . Sleep apnea    no cpap  . Testosterone deficiency   . Urethral stricture   . Vocal cord anomaly    pt states told from [place]  doctor heard a little gurgle sound -- no farther testing done    Past Surgical History:  Procedure Laterality Date  . COLONOSCOPY    . CYSTOSCOPY WITH URETHRAL DILATATION N/A 01/07/2013   Procedure: CYSTOSCOPY WITH URETHRAL DILATATION BALLOON DILATION ;  Surgeon: Bernestine Amass, MD;  Location: [facility];  Service: Urology;  Laterality: N/A;  . LUMBAR LAMINECTOMY/DECOMPRESSION MICRODISCECTOMY Right 11/29/2012   Procedure: Right Lumbar Four to Five Laminectomy/ Decompression Microdiskectomy;  Surgeon: Otilio Connors, MD;  Location: [place] [facility];  Service: Neurosurgery;  Laterality: Right;  LUMBAR  LAMINECTOMY/DECOMPRESSION MICRODISCECTOMY 1 LEVEL  . POLYPECTOMY    . SHOULDER ARTHROSCOPY W/ ACROMIAL REPAIR Left 08-28-2002  . UPPER GASTROINTESTINAL ENDOSCOPY      There were no vitals filed for this visit.   Subjective Assessment - 07/04/18 1021    Subjective  The thoracic pain came on suddenly. It is in the middle of my back. I feel it pulling when I move. The area is tender.     Diagnostic tests  MRI was negative    Patient Stated Goals  reduce pain    Currently in Pain?  Yes    Pain Score  8     Pain Location  Thoracic    Pain Orientation  Mid    Pain Descriptors / Indicators  Aching    Pain Type  Acute pain    Pain Onset  1 to 4 weeks ago    Pain Frequency  Intermittent    Aggravating Factors   movement of the spine,  sitting too long, sit with leaning back due to pain,     Pain Relieving Factors  no pressure, no moving    Multiple Pain Sites  No         [facility] PT Assessment - 07/04/18 0001      Assessment   Medical Diagnosis  M54.6 Thoracic spine pain  Referring Provider (PT)  Dr. Jovita Gamma    Onset Date/Surgical Date  05/04/18    Prior Therapy  none for thoracic      Precautions   Precautions  None      Restrictions   Weight Bearing Restrictions  No      Balance Screen   Has the patient fallen in the past 6 months  No    Has the patient had a decrease in activity level because of a fear of falling?   No    Is the patient reluctant to leave their home because of a fear of falling?   No      Prior Function   Level of Independence  Independent    Vocation  Retired    Leisure  fish, camping      Cognition   Overall Cognitive Status  Within Functional Limits for tasks assessed      Observation/Other Assessments   Focus on Therapeutic Outcomes (FOTO)   48% limitation; goal is 32% limitation      ROM / Strength   AROM / PROM / Strength  AROM;PROM;Strength      AROM   Thoracic Flexion  limited 50% with pain in T10-T12    Thoracic - Right Side  Bend  decreased 25%    Thoracic - Left Side Bend  decreased 25%    Thoracic - Right Rotation  decreased 50%    Thoracic - Left Rotation  full      Strength   Right Shoulder External Rotation  4/5    Right Shoulder Horizontal ABduction  4/5    Left Shoulder External Rotation  4/5    Left Shoulder Horizontal ABduction  4/5                Objective measurements completed on examination: See above findings.      [facility] Adult PT Treatment/Exercise - 07/04/18 0001      Manual Therapy   Manual Therapy  Soft tissue mobilization;Joint mobilization    Joint Mobilization  P-A and rotational mobilization to T6-T12 grade 3    Soft tissue mobilization  using the addaday with the blue round head at speed 1 along the thoracic area and lumbar, quadratus       Trigger Point Dry Needling - 07/04/18 1132    Consent Given?  Yes    Education Handout Provided  Yes    Muscles Treated Upper Body  --   thoracic multifidi T6-T12   Muscles Treated Lower Body  --   elongation of tissue, trigger point release          PT Education - 07/04/18 1119    Education Details  information on dry needling, information on massage tool    Person(s) Educated  Patient    Methods  Explanation;Demonstration;Verbal cues;Handout    Comprehension  Returned demonstration;Verbalized understanding          PT Long Term Goals - 07/04/18 1121      PT LONG TERM GOAL #1   Title  independent with HEP and understand how to progress himself    Time  4    Period  Weeks    Status  New    Target Date  08/01/18      PT LONG TERM GOAL #2   Title  sitting with leaning back on a chair with no thoracic pain    Time  4    Period  Weeks    Status  New  Target Date  08/01/18      PT LONG TERM GOAL #3   Title  FOTO score </= 32% limitation    Baseline  --    Time  4    Period  Weeks    Status  New    Target Date  08/01/18      PT LONG TERM GOAL #4   Title  thoracic movement with daily tasks reduced to  none to minimal    Baseline  --    Time  4    Period  Weeks    Status  New    Target Date  08/01/18             Plan - 07/04/18 1126    Clinical Impression Statement  Patient is a 63 year old male with sudden onset of thoracic pain. Patient reports his pain is 8/10 with thoracic movement and sitting with his back leaning on chair. Bilateral shoulder external rotation and rhomboids strength is 4/5. Patient thoracic movement is limited by 50% with pain in the T10 -T12 area. Patient has limited movement of T6-T12. Palpable tenderness located on thoracic paraspinals and T10-T12 area. Patient had decreased left lower rib cage movement with inhalation. After manual treatment the patient had no pain. Patient will benefit from skilled therapy to reduce thoracic pain to improve mobility and quality of life.     History and Personal Factors relevant to plan of care:  Lumbar laminectomy/decompression microdisectomy 11/29/12; hypertension    Clinical Presentation  Stable    Clinical Presentation due to:  stable condition    Clinical Decision Making  Low    Rehab Potential  Excellent    Clinical Impairments Affecting Rehab Potential  No lifting > 25#    PT Frequency  1x / week    PT Duration  4 weeks    PT Treatment/Interventions  Cryotherapy;Electrical Stimulation;Iontophoresis 4mg /ml Dexamethasone;Therapeutic activities;Therapeutic exercise;Neuromuscular re-education;Manual techniques;Patient/family education;Dry needling    PT Next Visit Plan  joint mobilization to thoracic and lower rib cage; soft tissue work to diaphgram and thoracic; back extensor strength; thoracic stretches    Consulted and Agree with Plan of Care  Patient       Patient will benefit from skilled therapeutic intervention in order to improve the following deficits and impairments:  Increased fascial restricitons, Pain, Decreased activity tolerance, Decreased strength, Decreased range of motion, Increased muscle spasms  Visit  Diagnosis: Pain in thoracic spine  Muscle spasm of back     Problem List Patient Active Problem List   Diagnosis Date Noted  . Exercise-induced asthma 07/21/2014  . Chronic cough 07/21/2014  . Upper airway cough syndrome 07/21/2014  . Allergic rhinitis 07/21/2014  . Lumbar stenosis 11/06/2013  . Urethral stricture 01/07/2013  . Prostatic hypertrophy 11/12/2012  . VOCAL CORD DISORDER 02/19/2009  . HYPERLIPIDEMIA 01/27/2009  . Snoring 01/27/2009  . HYPERTENSION 01/27/2009  . CHRONIC RHINITIS 01/27/2009  . Asthma 01/27/2009  . GERD 01/27/2009    Earlie Counts, PT 07/04/18 11:43 AM    [facility] [address], [place], [place], [address] Phone: [phone]   Fax:  [phone]  Name: Douglas Carpenter MRN: [id number] Date of Birth: 06/28/1955

## 2018-07-12 ENCOUNTER — Encounter: Payer: Medicare PPO | Admitting: Physical Therapy

## 2018-07-13 ENCOUNTER — Ambulatory Visit: Payer: Medicare PPO | Admitting: Physical Therapy

## 2018-07-17 ENCOUNTER — Encounter: Payer: Medicare PPO | Admitting: Physical Therapy

## 2018-07-19 ENCOUNTER — Encounter: Payer: Medicare PPO | Admitting: Physical Therapy

## 2018-07-26 ENCOUNTER — Ambulatory Visit: Payer: Medicare PPO | Admitting: Physical Therapy

## 2018-08-01 ENCOUNTER — Ambulatory Visit: Payer: Medicare PPO | Attending: Neurosurgery | Admitting: Physical Therapy

## 2018-08-01 ENCOUNTER — Encounter: Payer: Self-pay | Admitting: Physical Therapy

## 2018-08-01 DIAGNOSIS — M62830 Muscle spasm of back: Secondary | ICD-10-CM | POA: Diagnosis not present

## 2018-08-01 DIAGNOSIS — M6281 Muscle weakness (generalized): Secondary | ICD-10-CM | POA: Diagnosis not present

## 2018-08-01 DIAGNOSIS — M546 Pain in thoracic spine: Secondary | ICD-10-CM | POA: Insufficient documentation

## 2018-08-01 NOTE — Therapy (Signed)
[facility] [address] [place], [place], [address] Phone: [phone]   Fax:  [phone]  Physical Therapy Treatment  Patient Details  Name: Douglas Carpenter MRN: [id number] Date of Birth: 09/13/1955 Referring Provider (PT): Dr. Jovita Gamma   Encounter Date: 08/01/2018  PT End of Session - 08/01/18 0852    Visit Number  2    Date for PT Re-Evaluation  09/26/18    Authorization Type  [employer]    Authorization Time Period  approved to 01/02/2019    PT Start Time  0845    PT Stop Time  0925    PT Time Calculation (min)  40 min    Activity Tolerance  Patient tolerated treatment well    Behavior During Therapy  [facility] for tasks assessed/performed       Past Medical History:  Diagnosis Date  . Allergy   . Arthritis    "everywhere"  . BPH (benign prostatic hypertrophy)   . Chronic rhinitis 01/27/2009  . ED (erectile dysfunction)   . GERD (gastroesophageal reflux disease)   . Headache(784.0)    migraines, as many as 3 in a week    . HYPERLIPIDEMIA 01/27/2009  . HYPERTENSION 01/27/2009  . Mild asthma    seasonal allergies, uses Proair once per yr.   . Mild obstructive sleep apnea    per pt -- mild osa test result--  no rx cpap needed  . Neuromuscular disorder ([place])   . Sleep apnea    no cpap  . Testosterone deficiency   . Urethral stricture   . Vocal cord anomaly    pt states told from [place]  doctor heard a little gurgle sound -- no farther testing done    Past Surgical History:  Procedure Laterality Date  . COLONOSCOPY    . CYSTOSCOPY WITH URETHRAL DILATATION N/A 01/07/2013   Procedure: CYSTOSCOPY WITH URETHRAL DILATATION BALLOON DILATION ;  Surgeon: Bernestine Amass, MD;  Location: [facility];  Service: Urology;  Laterality: N/A;  . LUMBAR LAMINECTOMY/DECOMPRESSION MICRODISCECTOMY Right 11/29/2012   Procedure: Right Lumbar Four to Five Laminectomy/ Decompression Microdiskectomy;  Surgeon: Otilio Connors,  MD;  Location: [place] [facility];  Service: Neurosurgery;  Laterality: Right;  LUMBAR LAMINECTOMY/DECOMPRESSION MICRODISCECTOMY 1 LEVEL  . POLYPECTOMY    . SHOULDER ARTHROSCOPY W/ ACROMIAL REPAIR Left 08-28-2002  . UPPER GASTROINTESTINAL ENDOSCOPY      There were no vitals filed for this visit.  Subjective Assessment - 08/01/18 0849    Subjective  I have not been here due to insurance approval. I also had my wisdom tooth removed. My back pain is back but not as bad. Pain is now 4//10 and 50% better.     Diagnostic tests  MRI was negative    Patient Stated Goals  reduce pain    Currently in Pain?  Yes    Pain Score  4     Pain Location  Thoracic    Pain Orientation  Mid    Pain Descriptors / Indicators  Aching    Pain Type  Acute pain    Pain Onset  1 to 4 weeks ago    Pain Frequency  Intermittent    Aggravating Factors   movement of the spine, sitting too long, sit with leaning back     Pain Relieving Factors  no pressure, no moving    Multiple Pain Sites  No         [facility] PT Assessment - 08/01/18  0001      Assessment   Medical Diagnosis  M54.6 Thoracic spine pain    Referring Provider (PT)  Dr. Jovita Gamma    Onset Date/Surgical Date  05/04/18    Prior Therapy  none for thoracic      Precautions   Precautions  None      Restrictions   Weight Bearing Restrictions  No      Prior Function   Level of Independence  Independent    Vocation  Retired    Leisure  fish, camping      Cognition   Overall Cognitive Status  Within Functional Limits for tasks assessed      Observation/Other Assessments   Focus on Therapeutic Outcomes (FOTO)   48% limitation; goal is 32% limitation      AROM   Thoracic Flexion  limited 50% with pain in T10-T12    Thoracic - Right Side Bend  decreased 25%    Thoracic - Left Side Bend  decreased 25%    Thoracic - Right Rotation  decreased 50%    Thoracic - Left Rotation  full      Strength   Right Shoulder External Rotation  4/5    Right  Shoulder Horizontal ABduction  4/5    Left Shoulder External Rotation  4/5    Left Shoulder Horizontal ABduction  4/5      Palpation   Palpation comment  tightness in the diaphgram, decreased right lower rib mobility, decreased tissue mobility of thoracic paraspinals                   [facility] Adult PT Treatment/Exercise - 08/01/18 0001      Neuro Re-ed    Neuro Re-ed Details   diaphgramatic breathing to open up the rib cage while on foam roll along spine      Lumbar Exercises: Aerobic   UBE (Upper Arm Bike)  level 1, 2 min forward, 2 min backward      Shoulder Exercises: Supine   Flexion  Strengthening;Right;Left;10 reps   alternating, laying on foam roll   Flexion Limitations  then do both arms together 10x with breath    Other Supine Exercises  lay on foam roll along the T10 spine with extension to mobilize the joint    Other Supine Exercises  lay on tennis balls to mobilize the T10 area with breath and arm movements      Manual Therapy   Manual Therapy  Joint mobilization;Soft tissue mobilization    Joint Mobilization  to bilateral lower rib cage to work on bucket handle motion    Soft tissue mobilization  bilateral diaphragm; laying on foam roll on the left and soft tissue work to right thoracic paraspoinals, opening up the right rib cage, opening up the right facets of T8-T11             PT Education - 08/01/18 [place]    Education Details  Access Code: [email or web address]     Methods  Explanation;Demonstration;Verbal cues;Handout    Comprehension  Verbalized understanding       PT Short Term Goals - 08/01/18 0857      PT SHORT TERM GOAL #1   Title  ----      PT SHORT TERM GOAL #2   Title  -----      PT SHORT TERM GOAL #3   Title  ----      PT SHORT TERM GOAL #4   Title  ----  Baseline  ----        PT Long Term Goals - 08/01/18 1749      PT LONG TERM GOAL #1   Title  independent with HEP and understand how to progress himself    Time  4    Period   Weeks    Status  On-going      PT LONG TERM GOAL #2   Title  sitting with leaning back on a chair with no thoracic pain    Baseline  pain level 4/10    Time  4    Period  Weeks    Status  On-going      PT LONG TERM GOAL #3   Title  FOTO score </= 32% limitation    Time  4    Period  Weeks    Status  New      PT LONG TERM GOAL #4   Title  thoracic movement with daily tasks reduced to none to minimal    Time  4    Period  Weeks    Status  On-going            Plan - 08/01/18 4496    Clinical Impression Statement  Patient reports his pain level is 50% better. Patient has tightness located in the thoracic paraspinals. Patient has decreased mobilty of the T8 to T10 facets. Patient has decreased mobility of the lower rib cage on the right. Patient has not met goals due to having only the eval and 1 session due to waiting for insurance approval and having his wisdom teeth removed. Patient will benefit from silled therapy to improve mobility of thoracic and reduce pain.     Rehab Potential  Excellent    Clinical Impairments Affecting Rehab Potential  No lifting > 25#    PT Frequency  1x / week    PT Duration  8 weeks    PT Treatment/Interventions  Cryotherapy;Electrical Stimulation;Iontophoresis 58m/ml Dexamethasone;Therapeutic activities;Therapeutic exercise;Neuromuscular re-education;Manual techniques;Patient/family education;Dry needling    PT Next Visit Plan  joint mobilization to thoracic and lower rib cage; soft tissue work to diaphgram and thoracic; back extensor strength; thoracic stretches    PT Home Exercise Plan  Access Code: [email or web address]    Recommended Other Services  MD signed the initial eval. sent the renewal note on 08/01/2018    Consulted and Agree with Plan of Care  Patient       Patient will benefit from skilled therapeutic intervention in order to improve the following deficits and impairments:  Increased fascial restricitons, Pain, Decreased activity tolerance,  Decreased strength, Decreased range of motion, Increased muscle spasms  Visit Diagnosis: Pain in thoracic spine  Muscle spasm of back  Muscle weakness (generalized)     Problem List Patient Active Problem List   Diagnosis Date Noted  . Exercise-induced asthma 07/21/2014  . Chronic cough 07/21/2014  . Upper airway cough syndrome 07/21/2014  . Allergic rhinitis 07/21/2014  . Lumbar stenosis 11/06/2013  . Urethral stricture 01/07/2013  . Prostatic hypertrophy 11/12/2012  . VOCAL CORD DISORDER 02/19/2009  . HYPERLIPIDEMIA 01/27/2009  . Snoring 01/27/2009  . HYPERTENSION 01/27/2009  . CHRONIC RHINITIS 01/27/2009  . Asthma 01/27/2009  . GERD 01/27/2009    CEarlie Counts PT 08/01/18 10:18 AM   [facility] [address] [place] [address]Phone: [phone]  Fax:  [phone] Name: Douglas BOWKERMRN: [id number] of Birth: [birth year]

## 2018-08-01 NOTE — Patient Instructions (Signed)
Access Code: [email or web address]  URL: [email or web address]  Date: 08/01/2018  Prepared by: Earlie Counts   Exercises  Supine Mid-Thoracic Spine Mobilization with Taped Tennis Balls I's, Y's and T - 10 reps - 1 sets - 1x daily - 7x weekly  Thoracic Paraspinals Mobilization with Small Ball - 10 reps - 1 sets - 1x daily - 7x weekly  [facility] [address], [place], [place] [address] Phone # [phone] Fax [phone]

## 2018-08-07 ENCOUNTER — Ambulatory Visit: Payer: Medicare PPO | Admitting: Physical Therapy

## 2018-08-07 ENCOUNTER — Encounter: Payer: Self-pay | Admitting: Physical Therapy

## 2018-08-07 DIAGNOSIS — M62830 Muscle spasm of back: Secondary | ICD-10-CM

## 2018-08-07 DIAGNOSIS — M546 Pain in thoracic spine: Secondary | ICD-10-CM

## 2018-08-07 DIAGNOSIS — M6281 Muscle weakness (generalized): Secondary | ICD-10-CM | POA: Diagnosis not present

## 2018-08-07 NOTE — Therapy (Signed)
[facility] [address] [place], [place], [address] Phone: [phone]   Fax:  [phone]  Physical Therapy Treatment  Patient Details  Name: Douglas Carpenter MRN: [id number] Date of Birth: 1955-12-07 Referring Provider (PT): Dr. Jovita Gamma   Encounter Date: 08/07/2018  PT End of Session - 08/07/18 1103    Visit Number  3    Date for PT Re-Evaluation  09/26/18    Authorization Type  [employer]    Authorization Time Period  approved to 01/02/2019    PT Start Time  1015    PT Stop Time  1102    PT Time Calculation (min)  47 min    Activity Tolerance  Patient tolerated treatment well    Behavior During Therapy  [facility] for tasks assessed/performed       Past Medical History:  Diagnosis Date  . Allergy   . Arthritis    "everywhere"  . BPH (benign prostatic hypertrophy)   . Chronic rhinitis 01/27/2009  . ED (erectile dysfunction)   . GERD (gastroesophageal reflux disease)   . Headache(784.0)    migraines, as many as 3 in a week    . HYPERLIPIDEMIA 01/27/2009  . HYPERTENSION 01/27/2009  . Mild asthma    seasonal allergies, uses Proair once per yr.   . Mild obstructive sleep apnea    per pt -- mild osa test result--  no rx cpap needed  . Neuromuscular disorder ([place])   . Sleep apnea    no cpap  . Testosterone deficiency   . Urethral stricture   . Vocal cord anomaly    pt states told from [place]  doctor heard a little gurgle sound -- no farther testing done    Past Surgical History:  Procedure Laterality Date  . COLONOSCOPY    . CYSTOSCOPY WITH URETHRAL DILATATION N/A 01/07/2013   Procedure: CYSTOSCOPY WITH URETHRAL DILATATION BALLOON DILATION ;  Surgeon: Bernestine Amass, MD;  Location: [facility];  Service: Urology;  Laterality: N/A;  . LUMBAR LAMINECTOMY/DECOMPRESSION MICRODISCECTOMY Right 11/29/2012   Procedure: Right Lumbar Four to Five Laminectomy/ Decompression Microdiskectomy;  Surgeon: Otilio Connors,  MD;  Location: [facility];  Service: Neurosurgery;  Laterality: Right;  LUMBAR LAMINECTOMY/DECOMPRESSION MICRODISCECTOMY 1 LEVEL  . POLYPECTOMY    . SHOULDER ARTHROSCOPY W/ ACROMIAL REPAIR Left 08-28-2002  . UPPER GASTROINTESTINAL ENDOSCOPY      There were no vitals filed for this visit.  Subjective Assessment - 08/07/18 1024    Subjective  I felt good after last visit. I have been using the tennis balls. The tightness is back. I have pulling in the back toward the buttocks. I am still using the hot and cold to the back. Sitting with back against chair does not increase pain anymore.     Diagnostic tests  MRI was negative    Patient Stated Goals  reduce pain    Currently in Pain?  Yes    Pain Score  6     Pain Location  Thoracic    Pain Orientation  Mid    Pain Descriptors / Indicators  Aching    Pain Type  Acute pain    Pain Onset  1 to 4 weeks ago    Pain Frequency  Intermittent    Aggravating Factors   movement of the spine, sitting too long    Pain Relieving Factors  no movement    Multiple Pain Sites  No  [facility] PT Treatment/Exercise - 08/07/18 0001      Lumbar Exercises: Aerobic   UBE (Upper Arm Bike)  level 1, 2 min forward, 2 min backward      Lumbar Exercises: Seated   Other Seated Lumbar Exercises  sit on ball and rotate trunk while therapist guides the rib cage to both sides with breath      Lumbar Exercises: Sidelying   Other Sidelying Lumbar Exercises  thoracic rotation both ways moving top arm      Shoulder Exercises: Supine   Flexion  Strengthening;Right;Left;10 reps   alternating, laying on foam roll   Flexion Limitations  then do both arms together 10x with breath    Other Supine Exercises  lay on foam roll along the T10 spine with extension to mobilize the joint; along the spine and decompress the spine      Manual Therapy   Manual Therapy  Joint mobilization;Taping    Joint Mobilization  T4-T10 for rotation in  prone and sitting    Kinesiotex  Inhibit Muscle      Kinesiotix   Inhibit Muscle   along the thoracic and straight across the T10 vertebrae             PT Education - 08/07/18 1102    Education Details  [email or web address]     Person(s) Educated  Patient    Methods  Explanation;Demonstration;Verbal cues    Comprehension  Returned demonstration;Verbalized understanding       PT Short Term Goals - 08/01/18 0857      PT SHORT TERM GOAL #1   Title  ----      PT SHORT TERM GOAL #2   Title  -----      PT SHORT TERM GOAL #3   Title  ----      PT SHORT TERM GOAL #4   Title  ----    Baseline  ----        PT Long Term Goals - 08/07/18 1026      PT LONG TERM GOAL #1   Title  independent with HEP and understand how to progress himself    Baseline  still learning    Time  4    Period  Weeks    Status  On-going      PT LONG TERM GOAL #2   Title  sitting with leaning back on a chair with no thoracic pain    Baseline  no increase in pain    Time  4    Period  Weeks    Status  On-going      PT LONG TERM GOAL #3   Title  FOTO score </= 32% limitation    Time  4    Period  Weeks    Status  New      PT LONG TERM GOAL #4   Title  thoracic movement with daily tasks reduced to none to minimal    Time  4    Period  Weeks    Status  On-going            Plan - 08/07/18 1035    Clinical Impression Statement  Patient has tightness in the right rib cage at rib 5-8 laterally and tightness along the right T8-T10. Patient pain is reduce by 50%. Patient leaves therapy without pain but later on the pain returns. Patient is able to place his back on the chair without pain. Patient will benefit from skilled therapy to improve mobility of  thoracic and reduce pain.     Rehab Potential  Excellent    Clinical Impairments Affecting Rehab Potential  No lifting > 25#    PT Frequency  1x / week    PT Duration  8 weeks    PT Treatment/Interventions  Cryotherapy;Electrical  Stimulation;Iontophoresis 4mg /ml Dexamethasone;Therapeutic activities;Therapeutic exercise;Neuromuscular re-education;Manual techniques;Patient/family education;Dry needling    PT Next Visit Plan  joint mobilization to thoracic and lower rib cage; soft tissue work to diaphgram and thoracic; back extensor strength; thoracic stretches; see if the kinesio tape helped    PT Home Exercise Plan  Access Code: [email or web address]     Consulted and Agree with Plan of Care  Patient       Patient will benefit from skilled therapeutic intervention in order to improve the following deficits and impairments:  Increased fascial restricitons, Pain, Decreased activity tolerance, Decreased strength, Decreased range of motion, Increased muscle spasms  Visit Diagnosis: Pain in thoracic spine  Muscle spasm of back  Muscle weakness (generalized)     Problem List Patient Active Problem List   Diagnosis Date Noted  . Exercise-induced asthma 07/21/2014  . Chronic cough 07/21/2014  . Upper airway cough syndrome 07/21/2014  . Allergic rhinitis 07/21/2014  . Lumbar stenosis 11/06/2013  . Urethral stricture 01/07/2013  . Prostatic hypertrophy 11/12/2012  . VOCAL CORD DISORDER 02/19/2009  . HYPERLIPIDEMIA 01/27/2009  . Snoring 01/27/2009  . HYPERTENSION 01/27/2009  . CHRONIC RHINITIS 01/27/2009  . Asthma 01/27/2009  . GERD 01/27/2009    Earlie Counts, PT 08/07/18 11:06 AM   [facility] [address] [place], [place], [address] Phone: [phone]   Fax:  [phone]  Name: Douglas Carpenter MRN: [id number] Date of Birth: Apr 03, 1956

## 2018-08-07 NOTE — Patient Instructions (Signed)
Access Code: [email or web address]  URL: [email or web address]  Date: 08/07/2018  Prepared by: Earlie Counts   Exercises  Supine Mid-Thoracic Spine Mobilization with Taped Tennis Balls I's, Y's and T - 10 reps - 1 sets - 1x daily - 7x weekly  Thoracic Paraspinals Mobilization with Small Ball - 10 reps - 1 sets - 1x daily - 7x weekly  Sidelying Upper Thoracic Rotation - 10 reps - 1 sets - 1x daily - 7x weekly  [facility] [address], [place], [place] [address] Phone # [phone] Fax [phone]

## 2018-08-13 ENCOUNTER — Encounter: Payer: Medicare PPO | Admitting: Physical Therapy

## 2018-08-14 ENCOUNTER — Ambulatory Visit: Payer: Medicare PPO | Admitting: Physical Therapy

## 2018-08-14 ENCOUNTER — Encounter: Payer: Self-pay | Admitting: Physical Therapy

## 2018-08-14 DIAGNOSIS — M546 Pain in thoracic spine: Secondary | ICD-10-CM | POA: Diagnosis not present

## 2018-08-14 DIAGNOSIS — M6281 Muscle weakness (generalized): Secondary | ICD-10-CM | POA: Diagnosis not present

## 2018-08-14 DIAGNOSIS — M62830 Muscle spasm of back: Secondary | ICD-10-CM

## 2018-08-14 NOTE — Therapy (Signed)
[facility] [address], [place], [place], [address] Phone: [phone]   Fax:  [phone]  Physical Therapy Treatment  Patient Details  Name: Douglas Carpenter MRN: [id number] Date of Birth: Sep 24, 1955 Referring Provider (PT): Dr. Jovita Gamma   Encounter Date: 08/14/2018  PT End of Session - 08/14/18 1014    Visit Number  4    Date for PT Re-Evaluation  09/26/18    Authorization Type  [employer]    Authorization Time Period  approved to 01/02/2019    PT Start Time  0930    PT Stop Time  1010    PT Time Calculation (min)  40 min    Activity Tolerance  Patient tolerated treatment well;No increased pain    Behavior During Therapy  WFL for tasks assessed/performed       Past Medical History:  Diagnosis Date  . Allergy   . Arthritis    "everywhere"  . BPH (benign prostatic hypertrophy)   . Chronic rhinitis 01/27/2009  . ED (erectile dysfunction)   . GERD (gastroesophageal reflux disease)   . Headache(784.0)    migraines, as many as 3 in a week    . HYPERLIPIDEMIA 01/27/2009  . HYPERTENSION 01/27/2009  . Mild asthma    seasonal allergies, uses Proair once per yr.   . Mild obstructive sleep apnea    per pt -- mild osa test result--  no rx cpap needed  . Neuromuscular disorder ([place])   . Sleep apnea    no cpap  . Testosterone deficiency   . Urethral stricture   . Vocal cord anomaly    pt states told from [place]  doctor heard a little gurgle sound -- no farther testing done    Past Surgical History:  Procedure Laterality Date  . COLONOSCOPY    . CYSTOSCOPY WITH URETHRAL DILATATION N/A 01/07/2013   Procedure: CYSTOSCOPY WITH URETHRAL DILATATION BALLOON DILATION ;  Surgeon: Bernestine Amass, MD;  Location: [facility];  Service: Urology;  Laterality: N/A;  . LUMBAR LAMINECTOMY/DECOMPRESSION MICRODISCECTOMY Right 11/29/2012   Procedure: Right Lumbar Four to Five Laminectomy/ Decompression Microdiskectomy;   Surgeon: Otilio Connors, MD;  Location: [facility];  Service: Neurosurgery;  Laterality: Right;  LUMBAR LAMINECTOMY/DECOMPRESSION MICRODISCECTOMY 1 LEVEL  . POLYPECTOMY    . SHOULDER ARTHROSCOPY W/ ACROMIAL REPAIR Left 08-28-2002  . UPPER GASTROINTESTINAL ENDOSCOPY      There were no vitals filed for this visit.  Subjective Assessment - 08/14/18 0939    Subjective  My pain is 8/10 in the thoracic and I have increased pain in low back. Low back pain level 10/10. The tape helped the pain.     Diagnostic tests  MRI was negative    Patient Stated Goals  reduce pain    Currently in Pain?  Yes    Pain Score  8     Pain Location  Thoracic    Pain Orientation  Mid    Pain Descriptors / Indicators  Aching    Pain Type  Acute pain    Pain Onset  1 to 4 weeks ago    Pain Frequency  Intermittent    Aggravating Factors   movement of the spine, sitting too long    Pain Relieving Factors  no movement    Multiple Pain Sites  No                       [facility] Adult PT  Treatment/Exercise - 08/14/18 0001      Lumbar Exercises: Aerobic   UBE (Upper Arm Bike)  level 1, 2 min forward, 2 min backward      Manual Therapy   Manual Therapy  Soft tissue mobilization;Taping    Soft tissue mobilization  using the Addaday to relax the thoracic and lumbar paraspinals    Kinesiotex  Inhibit Muscle      Kinesiotix   Inhibit Muscle   along the thoracicand lumbar paraspinals and straight across the T10 vertebrae       Trigger Point Dry Needling - 08/14/18 0946    Consent Given?  Yes    Muscles Treated Upper Body  --   T8-L2 bil. multifidi   Muscles Treated Lower Body  --   trigger point response; elongation of tissue            PT Short Term Goals - 08/01/18 0857      PT SHORT TERM GOAL #1   Title  ----      PT SHORT TERM GOAL #2   Title  -----      PT SHORT TERM GOAL #3   Title  ----      PT SHORT TERM GOAL #4   Title  ----    Baseline  ----        PT Long Term  Goals - 08/14/18 1017      PT LONG TERM GOAL #1   Title  independent with HEP and understand how to progress himself    Baseline  still learning    Time  4    Period  Weeks    Status  On-going      PT LONG TERM GOAL #2   Title  sitting with leaning back on a chair with no thoracic pain    Baseline  after 3 days after therapy pain will return    Time  4    Period  Weeks    Status  On-going      PT LONG TERM GOAL #3   Title  FOTO score </= 32% limitation    Time  4    Period  Weeks    Status  New      PT LONG TERM GOAL #4   Title  thoracic movement with daily tasks reduced to none to minimal    Time  4    Period  Weeks    Status  On-going            Plan - 08/14/18 0946    Clinical Impression Statement  Patient came in with increased pain for thoracic 8/10 and lumbar 10/10. After therapy the pain decreased to 0/10. Patient has increased muscle spasms of the thoracic and lumbar muscles. The spasms in the lumbar may be increasing the spasms in the thoracic. Patient has most pain going from sit to stand. Patient will benefit from skilled therapy to improve mobility of thoracic and reduce pain.     Rehab Potential  Excellent    Clinical Impairments Affecting Rehab Potential  No lifting > 25#    PT Frequency  1x / week    PT Duration  8 weeks    PT Treatment/Interventions  Cryotherapy;Electrical Stimulation;Iontophoresis 4mg /ml Dexamethasone;Therapeutic activities;Therapeutic exercise;Neuromuscular re-education;Manual techniques;Patient/family education;Dry needling    PT Next Visit Plan  joint mobilization to thoracic and lower rib cage; soft tissue work to diaphgram and thoracic; back extensor strength; thoracic stretches;  kinesio tape helped    PT Home  Exercise Plan  Access Code: [email or web address]     Recommended Other Services  MD signed renewal note    Consulted and Agree with Plan of Care  Patient       Patient will benefit from skilled therapeutic intervention in order to  improve the following deficits and impairments:  Increased fascial restricitons, Pain, Decreased activity tolerance, Decreased strength, Decreased range of motion, Increased muscle spasms  Visit Diagnosis: Pain in thoracic spine  Muscle spasm of back  Muscle weakness (generalized)     Problem List Patient Active Problem List   Diagnosis Date Noted  . Exercise-induced asthma 07/21/2014  . Chronic cough 07/21/2014  . Upper airway cough syndrome 07/21/2014  . Allergic rhinitis 07/21/2014  . Lumbar stenosis 11/06/2013  . Urethral stricture 01/07/2013  . Prostatic hypertrophy 11/12/2012  . VOCAL CORD DISORDER 02/19/2009  . HYPERLIPIDEMIA 01/27/2009  . Snoring 01/27/2009  . HYPERTENSION 01/27/2009  . CHRONIC RHINITIS 01/27/2009  . Asthma 01/27/2009  . GERD 01/27/2009    Earlie Counts, PT 08/14/18 10:18 AM   [facility] [address], [place], [place], [address] Phone: [phone]   Fax:  [phone]  Name: Douglas Carpenter MRN: [id number] Date of Birth: January 19, 1956

## 2018-08-20 ENCOUNTER — Encounter: Payer: Self-pay | Admitting: Physical Therapy

## 2018-08-20 ENCOUNTER — Ambulatory Visit: Payer: Medicare PPO | Admitting: Physical Therapy

## 2018-08-20 DIAGNOSIS — M6281 Muscle weakness (generalized): Secondary | ICD-10-CM

## 2018-08-20 DIAGNOSIS — M546 Pain in thoracic spine: Secondary | ICD-10-CM

## 2018-08-20 DIAGNOSIS — M62830 Muscle spasm of back: Secondary | ICD-10-CM | POA: Diagnosis not present

## 2018-08-20 NOTE — Therapy (Signed)
[facility] [address] [place], [place], [address] Phone: [phone]   Fax:  [phone]  Physical Therapy Treatment  Patient Details  Name: Douglas Carpenter MRN: [id number] Date of Birth: 1956/01/06 Referring Provider (PT): Dr. Jovita Gamma   Encounter Date: 08/20/2018  PT End of Session - 08/20/18 0938    Visit Number  5    Date for PT Re-Evaluation  09/26/18    Authorization Type  [employer]    Authorization Time Period  approved to 01/02/2019    PT Start Time  0930    PT Stop Time  1010    PT Time Calculation (min)  40 min    Activity Tolerance  Patient tolerated treatment well;No increased pain    Behavior During Therapy  WFL for tasks assessed/performed       Past Medical History:  Diagnosis Date  . Allergy   . Arthritis    "everywhere"  . BPH (benign prostatic hypertrophy)   . Chronic rhinitis 01/27/2009  . ED (erectile dysfunction)   . GERD (gastroesophageal reflux disease)   . Headache(784.0)    migraines, as many as 3 in a week    . HYPERLIPIDEMIA 01/27/2009  . HYPERTENSION 01/27/2009  . Mild asthma    seasonal allergies, uses Proair once per yr.   . Mild obstructive sleep apnea    per pt -- mild osa test result--  no rx cpap needed  . Neuromuscular disorder ([place])   . Sleep apnea    no cpap  . Testosterone deficiency   . Urethral stricture   . Vocal cord anomaly    pt states told from [place]  doctor heard a little gurgle sound -- no farther testing done    Past Surgical History:  Procedure Laterality Date  . COLONOSCOPY    . CYSTOSCOPY WITH URETHRAL DILATATION N/A 01/07/2013   Procedure: CYSTOSCOPY WITH URETHRAL DILATATION BALLOON DILATION ;  Surgeon: Bernestine Amass, MD;  Location: [facility];  Service: Urology;  Laterality: N/A;  . LUMBAR LAMINECTOMY/DECOMPRESSION MICRODISCECTOMY Right 11/29/2012   Procedure: Right Lumbar Four to Five Laminectomy/ Decompression Microdiskectomy;   Surgeon: Otilio Connors, MD;  Location: [facility];  Service: Neurosurgery;  Laterality: Right;  LUMBAR LAMINECTOMY/DECOMPRESSION MICRODISCECTOMY 1 LEVEL  . POLYPECTOMY    . SHOULDER ARTHROSCOPY W/ ACROMIAL REPAIR Left 08-28-2002  . UPPER GASTROINTESTINAL ENDOSCOPY      There were no vitals filed for this visit.  Subjective Assessment - 08/20/18 0933    Subjective  The pain is back. I have relief for 2-3 days after therapy. My legs are starting to feel different especially my left leg. The longer I stand on my right leg it gets numb. My right knee is hurting. Sitting with pressure on the thoracic spine is not as sore, 30% better.     Diagnostic tests  MRI was negative    Patient Stated Goals  reduce pain    Currently in Pain?  Yes    Pain Score  8     Pain Location  Thoracic    Pain Orientation  Mid    Pain Descriptors / Indicators  Aching    Pain Type  Acute pain    Pain Onset  1 to 4 weeks ago    Pain Frequency  Intermittent    Aggravating Factors   leaning to the side and front the more pain I have    Pain Relieving Factors  no movement  Multiple Pain Sites  No                       [facility] Adult PT Treatment/Exercise - 08/20/18 0001      Lumbar Exercises: Aerobic   UBE (Upper Arm Bike)  level 1, 2 min forward, 2 min backward      Lumbar Exercises: Sidelying   Other Sidelying Lumbar Exercises  thoracic rotation both ways moving top arm   therapist blocked the lumbar to decrease strain     Shoulder Exercises: Supine   Flexion  Strengthening;Right;Left;10 reps   alternating, laying on foam roll   Other Supine Exercises  lay on foam roll with pectoralis stretch holding for 15 sec  2 times      Manual Therapy   Manual Therapy  Soft tissue mobilization;Taping    Soft tissue mobilization  using the Addaday to relax the thoracic and lumbar paraspinals    Kinesiotex  Inhibit Muscle      Kinesiotix   Inhibit Muscle   along the thoracicand lumbar paraspinals  and straight across the T10 vertebrae               PT Short Term Goals - 08/01/18 0857      PT SHORT TERM GOAL #1   Title  ----      PT SHORT TERM GOAL #2   Title  -----      PT SHORT TERM GOAL #3   Title  ----      PT SHORT TERM GOAL #4   Title  ----    Baseline  ----        PT Long Term Goals - 08/14/18 1017      PT LONG TERM GOAL #1   Title  independent with HEP and understand how to progress himself    Baseline  still learning    Time  4    Period  Weeks    Status  On-going      PT LONG TERM GOAL #2   Title  sitting with leaning back on a chair with no thoracic pain    Baseline  after 3 days after therapy pain will return    Time  4    Period  Weeks    Status  On-going      PT LONG TERM GOAL #3   Title  FOTO score </= 32% limitation    Time  4    Period  Weeks    Status  New      PT LONG TERM GOAL #4   Title  thoracic movement with daily tasks reduced to none to minimal    Time  4    Period  Weeks    Status  On-going            Plan - 08/20/18 0939    Clinical Impression Statement  Patient is able to place his back on the chair wiht 30% less pain. Patient will leave without pain but 3 days later the pain returns. Patient has tightness in the T8-T12 area. Patient has decreased right rib cage mobility. Patient is going to the [place] to have his back lookded at further. Patient will benefit from skilled therapy to improve mobility of thoracic and reduce pain.     Rehab Potential  Excellent    Clinical Impairments Affecting Rehab Potential  No lifting > 25#    PT Frequency  1x / week    PT Duration  8 weeks    PT Treatment/Interventions  Cryotherapy;Electrical Stimulation;Iontophoresis 4mg /ml Dexamethasone;Therapeutic activities;Therapeutic exercise;Neuromuscular re-education;Manual techniques;Patient/family education;Dry needling    PT Next Visit Plan  joint mobilization to thoracic and lower rib cage; soft tissue work to diaphgram and thoracic;  back extensor strength; thoracic stretches;  kinesio tape helped    PT Home Exercise Plan  Access Code: [email or web address]     Consulted and Agree with Plan of Care  Patient       Patient will benefit from skilled therapeutic intervention in order to improve the following deficits and impairments:  Increased fascial restricitons, Pain, Decreased activity tolerance, Decreased strength, Decreased range of motion, Increased muscle spasms  Visit Diagnosis: Pain in thoracic spine  Muscle spasm of back  Muscle weakness (generalized)     Problem List Patient Active Problem List   Diagnosis Date Noted  . Exercise-induced asthma 07/21/2014  . Chronic cough 07/21/2014  . Upper airway cough syndrome 07/21/2014  . Allergic rhinitis 07/21/2014  . Lumbar stenosis 11/06/2013  . Urethral stricture 01/07/2013  . Prostatic hypertrophy 11/12/2012  . VOCAL CORD DISORDER 02/19/2009  . HYPERLIPIDEMIA 01/27/2009  . Snoring 01/27/2009  . HYPERTENSION 01/27/2009  . CHRONIC RHINITIS 01/27/2009  . Asthma 01/27/2009  . GERD 01/27/2009    Earlie Counts, PT 08/20/18 10:15 AM   [facility] [address] [place], [place], [address] Phone: [phone]   Fax:  [phone]  Name: EDDISON SEARLS MRN: [id number] Date of Birth: July 09, 1955

## 2018-08-27 ENCOUNTER — Encounter: Payer: Medicare PPO | Admitting: Physical Therapy

## 2018-08-29 ENCOUNTER — Encounter: Payer: Self-pay | Admitting: Physical Therapy

## 2018-08-29 ENCOUNTER — Ambulatory Visit: Payer: Medicare PPO | Attending: Neurosurgery | Admitting: Physical Therapy

## 2018-08-29 DIAGNOSIS — M62830 Muscle spasm of back: Secondary | ICD-10-CM

## 2018-08-29 DIAGNOSIS — M546 Pain in thoracic spine: Secondary | ICD-10-CM

## 2018-08-29 DIAGNOSIS — M6281 Muscle weakness (generalized): Secondary | ICD-10-CM | POA: Diagnosis not present

## 2018-08-29 NOTE — Therapy (Signed)
[facility] [address], [place], [place], [address] Phone: [phone]   Fax:  [phone]  Physical Therapy Treatment  Patient Details  Name: Douglas Carpenter MRN: [id number] Date of Birth: Jan 08, 1956 Referring Provider (PT): Dr. Jovita Gamma   Encounter Date: 08/29/2018  PT End of Session - 08/29/18 1138    Visit Number  6    Date for PT Re-Evaluation  09/26/18    Authorization Type  [employer]    Authorization Time Period  approved to 01/02/2019    PT Start Time  1100    PT Stop Time  1138    PT Time Calculation (min)  38 min    Activity Tolerance  Patient tolerated treatment well;No increased pain    Behavior During Therapy  WFL for tasks assessed/performed       Past Medical History:  Diagnosis Date  . Allergy   . Arthritis    "everywhere"  . BPH (benign prostatic hypertrophy)   . Chronic rhinitis 01/27/2009  . ED (erectile dysfunction)   . GERD (gastroesophageal reflux disease)   . Headache(784.0)    migraines, as many as 3 in a week    . HYPERLIPIDEMIA 01/27/2009  . HYPERTENSION 01/27/2009  . Mild asthma    seasonal allergies, uses Proair once per yr.   . Mild obstructive sleep apnea    per pt -- mild osa test result--  no rx cpap needed  . Neuromuscular disorder ([place])   . Sleep apnea    no cpap  . Testosterone deficiency   . Urethral stricture   . Vocal cord anomaly    pt states told from [place]  doctor heard a little gurgle sound -- no farther testing done    Past Surgical History:  Procedure Laterality Date  . COLONOSCOPY    . CYSTOSCOPY WITH URETHRAL DILATATION N/A 01/07/2013   Procedure: CYSTOSCOPY WITH URETHRAL DILATATION BALLOON DILATION ;  Surgeon: Bernestine Amass, MD;  Location: [facility];  Service: Urology;  Laterality: N/A;  . LUMBAR LAMINECTOMY/DECOMPRESSION MICRODISCECTOMY Right 11/29/2012   Procedure: Right Lumbar Four to Five Laminectomy/ Decompression Microdiskectomy;   Surgeon: Otilio Connors, MD;  Location: [facility];  Service: Neurosurgery;  Laterality: Right;  LUMBAR LAMINECTOMY/DECOMPRESSION MICRODISCECTOMY 1 LEVEL  . POLYPECTOMY    . SHOULDER ARTHROSCOPY W/ ACROMIAL REPAIR Left 08-28-2002  . UPPER GASTROINTESTINAL ENDOSCOPY      There were no vitals filed for this visit.  Subjective Assessment - 08/29/18 1103    Subjective  The MD gave me a steroid and muscle relaxer. No changes yet with the medication.     Diagnostic tests  MRI was negative    Patient Stated Goals  reduce pain    Currently in Pain?  Yes    Pain Score  8     Pain Location  Thoracic    Pain Orientation  Mid    Pain Descriptors / Indicators  Aching    Pain Type  Acute pain    Pain Onset  1 to 4 weeks ago    Pain Frequency  Intermittent    Aggravating Factors   leaning to the rside and front the more pain I have    Pain Relieving Factors  no movement    Multiple Pain Sites  No                       [facility] Adult PT Treatment/Exercise - 08/29/18 0001  Lumbar Exercises: Stretches   Other Lumbar Stretch Exercise  standing with elbows on roll and roll back and forth to stretch lats and thoracic, hands on roll with moving hips to side to stretch lateral thoracic while using the addaday to massage the right thoracic area      Lumbar Exercises: Aerobic   UBE (Upper Arm Bike)  level 2, 3 min forward, 3 min backward   while assessing patient     Shoulder Exercises: Supine   Protraction  Strengthening;Both;15 reps   laying on foam roll   Horizontal ABduction  Strengthening;Both;15 reps;Theraband   laying on the foam roll   Theraband Level (Shoulder Horizontal ABduction)  Level 2 (Red)    Flexion  Strengthening;Right;Left;10 reps   alternating, laying on foam roll   Diagonals  Strengthening;Both;15 reps;Theraband   on foam roll   Theraband Level (Shoulder Diagonals)  Level 3 (Green)    Other Supine Exercises  lay on foam roll with pectoralis stretch holding  for 15 sec  2 times      Manual Therapy   Manual Therapy  Soft tissue mobilization;Taping    Soft tissue mobilization  using the Addaday to relax the thoracic and lumbar paraspinals    Kinesiotex  Inhibit Muscle               PT Short Term Goals - 08/01/18 0857      PT SHORT TERM GOAL #1   Title  ----      PT SHORT TERM GOAL #2   Title  -----      PT SHORT TERM GOAL #3   Title  ----      PT SHORT TERM GOAL #4   Title  ----    Baseline  ----        PT Long Term Goals - 08/29/18 1110      PT LONG TERM GOAL #1   Title  independent with HEP and understand how to progress himself    Baseline  still learning    Time  4    Period  Weeks    Status  On-going      PT LONG TERM GOAL #2   Title  sitting with leaning back on a chair with no thoracic pain    Baseline  patient is now able to lean back on a couch but not on a hard chair, improve by 25%    Time  4    Period  Weeks    Status  On-going      PT LONG TERM GOAL #3   Title  FOTO score </= 32% limitation    Time  4    Period  Weeks    Status  On-going      PT LONG TERM GOAL #4   Title  thoracic movement with daily tasks reduced to none to minimal    Baseline  25% better    Time  4    Period  Weeks    Status  On-going            Plan - 08/29/18 1139    Clinical Impression Statement  Patient is able to lean on his couch that is soft but not on a firm chair. Patient is now taking muscle relaxers and steroid for pain. Patient has tightness in the right thoracic area. Patient will benefit from skilled therapy to improve mobility of thoeracic and reduce pain.     Rehab Potential  Excellent    Clinical  Impairments Affecting Rehab Potential  No lifting > 25#    PT Frequency  1x / week    PT Duration  8 weeks    PT Treatment/Interventions  Cryotherapy;Electrical Stimulation;Iontophoresis 4mg /ml Dexamethasone;Therapeutic activities;Therapeutic exercise;Neuromuscular re-education;Manual  techniques;Patient/family education;Dry needling    PT Next Visit Plan  joint mobilization to thoracic and lower rib cage; soft tissue work to diaphgram and thoracic; back extensor strength; thoracic stretches;  kinesio tape to thoracic and lumbar area    PT Home Exercise Plan  Access Code: [email or web address]     Consulted and Agree with Plan of Care  Patient       Patient will benefit from skilled therapeutic intervention in order to improve the following deficits and impairments:  Increased fascial restricitons, Pain, Decreased activity tolerance, Decreased strength, Decreased range of motion, Increased muscle spasms  Visit Diagnosis: Pain in thoracic spine  Muscle spasm of back  Muscle weakness (generalized)     Problem List Patient Active Problem List   Diagnosis Date Noted  . Exercise-induced asthma 07/21/2014  . Chronic cough 07/21/2014  . Upper airway cough syndrome 07/21/2014  . Allergic rhinitis 07/21/2014  . Lumbar stenosis 11/06/2013  . Urethral stricture 01/07/2013  . Prostatic hypertrophy 11/12/2012  . VOCAL CORD DISORDER 02/19/2009  . HYPERLIPIDEMIA 01/27/2009  . Snoring 01/27/2009  . HYPERTENSION 01/27/2009  . CHRONIC RHINITIS 01/27/2009  . Asthma 01/27/2009  . GERD 01/27/2009    Earlie Counts, PT 08/29/18 11:42 AM   [facility] [address], [place], [place], [address] Phone: [phone]   Fax:  [phone]  Name: Douglas Carpenter MRN: [id number] Date of Birth: 1956/05/29

## 2018-09-05 ENCOUNTER — Other Ambulatory Visit: Payer: Self-pay

## 2018-09-05 ENCOUNTER — Encounter: Payer: Self-pay | Admitting: Physical Therapy

## 2018-09-05 ENCOUNTER — Ambulatory Visit: Payer: Medicare PPO | Admitting: Physical Therapy

## 2018-09-05 DIAGNOSIS — M62830 Muscle spasm of back: Secondary | ICD-10-CM

## 2018-09-05 DIAGNOSIS — M546 Pain in thoracic spine: Secondary | ICD-10-CM

## 2018-09-05 DIAGNOSIS — M6281 Muscle weakness (generalized): Secondary | ICD-10-CM | POA: Diagnosis not present

## 2018-09-05 NOTE — Therapy (Signed)
[facility] [address] [place], [place], [address] Phone: [phone]   Fax:  [phone]  Physical Therapy Treatment  Patient Details  Name: Douglas Carpenter MRN: [id number] Date of Birth: 03-08-1956 Referring Provider (PT): Dr. Jovita Gamma   Encounter Date: 09/05/2018  PT End of Session - 09/05/18 1612    Visit Number  7    Date for PT Re-Evaluation  09/26/18    Authorization Type  [employer]    Authorization Time Period  approved to 01/02/2019    PT Start Time  7616    PT Stop Time  1608    PT Time Calculation (min)  38 min    Activity Tolerance  Patient tolerated treatment well;No increased pain    Behavior During Therapy  WFL for tasks assessed/performed       Past Medical History:  Diagnosis Date  . Allergy   . Arthritis    "everywhere"  . BPH (benign prostatic hypertrophy)   . Chronic rhinitis 01/27/2009  . ED (erectile dysfunction)   . GERD (gastroesophageal reflux disease)   . Headache(784.0)    migraines, as many as 3 in a week    . HYPERLIPIDEMIA 01/27/2009  . HYPERTENSION 01/27/2009  . Mild asthma    seasonal allergies, uses Proair once per yr.   . Mild obstructive sleep apnea    per pt -- mild osa test result--  no rx cpap needed  . Neuromuscular disorder ([place])   . Sleep apnea    no cpap  . Testosterone deficiency   . Urethral stricture   . Vocal cord anomaly    pt states told from [place]  doctor heard a little gurgle sound -- no farther testing done    Past Surgical History:  Procedure Laterality Date  . COLONOSCOPY    . CYSTOSCOPY WITH URETHRAL DILATATION N/A 01/07/2013   Procedure: CYSTOSCOPY WITH URETHRAL DILATATION BALLOON DILATION ;  Surgeon: Bernestine Amass, MD;  Location: [facility];  Service: Urology;  Laterality: N/A;  . LUMBAR LAMINECTOMY/DECOMPRESSION MICRODISCECTOMY Right 11/29/2012   Procedure: Right Lumbar Four to Five Laminectomy/ Decompression Microdiskectomy;   Surgeon: Otilio Connors, MD;  Location: [place] [facility];  Service: Neurosurgery;  Laterality: Right;  LUMBAR LAMINECTOMY/DECOMPRESSION MICRODISCECTOMY 1 LEVEL  . POLYPECTOMY    . SHOULDER ARTHROSCOPY W/ ACROMIAL REPAIR Left 08-28-2002  . UPPER GASTROINTESTINAL ENDOSCOPY      There were no vitals filed for this visit.  Subjective Assessment - 09/05/18 1534    Subjective  I feel the medication and muscle relaxer is helping. The last visit helped. My doctor is having me do Tai Chi and accupuncture.     Diagnostic tests  MRI was negative    Patient Stated Goals  reduce pain    Currently in Pain?  Yes    Pain Score  4     Pain Location  Thoracic    Pain Orientation  Mid    Pain Descriptors / Indicators  Aching    Pain Type  Acute pain    Pain Onset  1 to 4 weeks ago    Pain Frequency  Intermittent    Aggravating Factors   sidebending, yardwork    Pain Relieving Factors  No movement                       [facility] Adult PT Treatment/Exercise - 09/05/18 0001      Lumbar Exercises: Stretches   Active  Hamstring Stretch  Left;2 reps;30 seconds      Lumbar Exercises: Standing   Other Standing Lumbar Exercises  stand bent a hips with foam roll on mat and roll forard and backward with therapist working on the right T10 area and move hands to the left for sidebending      Lumbar Exercises: Seated   Other Seated Lumbar Exercises  sit on green physioball with chop/lift, PNF to open up the chest       Shoulder Exercises: Supine   Protraction  Strengthening;Both;20 reps   laying on foam roll   Protraction Weight (lbs)  2    Horizontal ABduction  Strengthening;Both;10 reps;Theraband    Theraband Level (Shoulder Horizontal ABduction)  Level 2 (Red)   10x with green   ABduction  Strengthening;Both;10 reps   laying on foam roll   Diagonals  Strengthening;Both;15 reps;Theraband   on foam roll   Theraband Level (Shoulder Diagonals)  Level 3 (Green)               PT Short  Term Goals - 08/01/18 0857      PT SHORT TERM GOAL #1   Title  ----      PT SHORT TERM GOAL #2   Title  -----      PT SHORT TERM GOAL #3   Title  ----      PT SHORT TERM GOAL #4   Title  ----    Baseline  ----        PT Long Term Goals - 09/05/18 1615      PT LONG TERM GOAL #2   Title  sitting with leaning back on a chair with no thoracic pain    Baseline  patient is now able to lean back on a couch but not on a hard chair, improve by 25%    Time  4    Period  Weeks    Status  On-going      PT LONG TERM GOAL #4   Title  thoracic movement with daily tasks reduced to none to minimal    Baseline  50% better    Time  4    Period  Weeks    Status  On-going            Plan - 09/05/18 1613    Clinical Impression Statement  Patient had no pain after therapy. Patient had full thoracic movement after therapy. Patient did not have the muscle spasm on the right low thoracic due to increased movement. Patient pain is not constant. Patient is able to extend his thoracic spine with exercise with greater ease. Patient will benefit from skilled therapy to improve mobility of thoracic and reduce pain.     Rehab Potential  Excellent    Clinical Impairments Affecting Rehab Potential  No lifting > 25#    PT Frequency  1x / week    PT Duration  8 weeks    PT Treatment/Interventions  Cryotherapy;Electrical Stimulation;Iontophoresis 10m/ml Dexamethasone;Therapeutic activities;Therapeutic exercise;Neuromuscular re-education;Manual techniques;Patient/family education;Dry needling    PT Next Visit Plan  thoracic mobility with foam roll and sitting on physioball    PT Home Exercise Plan  Access Code: [email or web address]    Consulted and Agree with Plan of Care  Patient       Patient will benefit from skilled therapeutic intervention in order to improve the following deficits and impairments:  Increased fascial restricitons, Pain, Decreased activity tolerance, Decreased strength, Decreased range of  motion, Increased muscle spasms  Visit Diagnosis: Pain in thoracic spine  Muscle spasm of back  Muscle weakness (generalized)     Problem List Patient Active Problem List   Diagnosis Date Noted  . Exercise-induced asthma 07/21/2014  . Chronic cough 07/21/2014  . Upper airway cough syndrome 07/21/2014  . Allergic rhinitis 07/21/2014  . Lumbar stenosis 11/06/2013  . Urethral stricture 01/07/2013  . Prostatic hypertrophy 11/12/2012  . VOCAL CORD DISORDER 02/19/2009  . HYPERLIPIDEMIA 01/27/2009  . Snoring 01/27/2009  . HYPERTENSION 01/27/2009  . CHRONIC RHINITIS 01/27/2009  . Asthma 01/27/2009  . GERD 01/27/2009    Earlie Counts, PT 09/05/18 4:16 PM   [facility] [address], [place], [place], [address] Phone: [phone]   Fax:  [phone]  Name: Douglas Carpenter MRN: [id number] Date of Birth: 11/19/1955

## 2018-09-07 ENCOUNTER — Encounter

## 2018-09-10 ENCOUNTER — Encounter: Payer: Self-pay | Admitting: Physical Therapy

## 2018-09-10 ENCOUNTER — Other Ambulatory Visit: Payer: Self-pay

## 2018-09-10 ENCOUNTER — Ambulatory Visit: Payer: Medicare PPO | Admitting: Physical Therapy

## 2018-09-10 DIAGNOSIS — M6281 Muscle weakness (generalized): Secondary | ICD-10-CM

## 2018-09-10 DIAGNOSIS — M62830 Muscle spasm of back: Secondary | ICD-10-CM

## 2018-09-10 DIAGNOSIS — M546 Pain in thoracic spine: Secondary | ICD-10-CM

## 2018-09-10 NOTE — Therapy (Addendum)
[facility] [address], [place], [place], [address] Phone: [phone]   Fax:  [phone]  Physical Therapy Treatment  Patient Details  Name: Douglas Carpenter MRN: [id number] Date of Birth: [birth year] Referring Provider (PT): Dr. Jovita Gamma   Encounter Date: 09/10/2018  PT End of Session - 09/10/18 1408    Visit Number  8    Date for PT Re-Evaluation  09/26/18    Authorization Type  [employer]    Authorization Time Period  approved to 01/02/2019    PT Start Time  1400    PT Stop Time  1440    PT Time Calculation (min)  40 min    Activity Tolerance  Patient tolerated treatment well;No increased pain    Behavior During Therapy  WFL for tasks assessed/performed       Past Medical History:  Diagnosis Date  . Allergy   . Arthritis    "everywhere"  . BPH (benign prostatic hypertrophy)   . Chronic rhinitis 01/27/2009  . ED (erectile dysfunction)   . GERD (gastroesophageal reflux disease)   . Headache(784.0)    migraines, as many as 3 in a week    . HYPERLIPIDEMIA 01/27/2009  . HYPERTENSION 01/27/2009  . Mild asthma    seasonal allergies, uses Proair once per yr.   . Mild obstructive sleep apnea    per pt -- mild osa test result--  no rx cpap needed  . Neuromuscular disorder ([place])   . Sleep apnea    no cpap  . Testosterone deficiency   . Urethral stricture   . Vocal cord anomaly    pt states told from [place]  doctor heard a little gurgle sound -- no farther testing done    Past Surgical History:  Procedure Laterality Date  . COLONOSCOPY    . CYSTOSCOPY WITH URETHRAL DILATATION N/A 01/07/2013   Procedure: CYSTOSCOPY WITH URETHRAL DILATATION BALLOON DILATION ;  Surgeon: Bernestine Amass, MD;  Location: [facility];  Service: Urology;  Laterality: N/A;  . LUMBAR LAMINECTOMY/DECOMPRESSION MICRODISCECTOMY Right 11/29/2012   Procedure: Right Lumbar Four to Five Laminectomy/ Decompression Microdiskectomy;   Surgeon: Otilio Connors, MD;  Location: [place] [facility];  Service: Neurosurgery;  Laterality: Right;  LUMBAR LAMINECTOMY/DECOMPRESSION MICRODISCECTOMY 1 LEVEL  . POLYPECTOMY    . SHOULDER ARTHROSCOPY W/ ACROMIAL REPAIR Left 08-28-2002  . UPPER GASTROINTESTINAL ENDOSCOPY      There were no vitals filed for this visit.  Subjective Assessment - 09/10/18 1403    Subjective  I am going to [place]. I still have to set up the appointment. I have pulling between the middle and small of back. Not sore to the touch anymore.     Diagnostic tests  MRI was negative    Patient Stated Goals  reduce pain    Currently in Pain?  Yes    Pain Score  5     Pain Location  Thoracic    Pain Orientation  Mid    Pain Descriptors / Indicators  Aching    Pain Type  Acute pain    Pain Onset  1 to 4 weeks ago    Pain Frequency  Intermittent    Aggravating Factors   getting in and out of the car    Pain Relieving Factors  no movement    Multiple Pain Sites  No  [place] Adult PT Treatment/Exercise - 09/10/18 0001      Lumbar Exercises: Aerobic   UBE (Upper Arm Bike)  level 2, 3 min forward, 3 min backward; in standing   while assessing patient     Lumbar Exercises: Standing   Other Standing Lumbar Exercises  stand bent a hips with foam roll on mat and roll forard and backward with therapist working on the right T10 area and move hands to the left for sidebending    Other Standing Lumbar Exercises  hands on wall and bring arm back to rotate spine and mobilize thoracic; wall pushup;       Lumbar Exercises: Seated   Other Seated Lumbar Exercises  sit on green physioball with chop/lift, PNF to open up the chest       Shoulder Exercises: Supine   Protraction  Strengthening;Both;20 reps;Weights   laying on foam roll   Protraction Weight (lbs)  2    Protraction Limitations  laying on foam roll    Horizontal ABduction  Strengthening;Both;10 reps;Theraband    Theraband Level  (Shoulder Horizontal ABduction)  Level 2 (Red)   10x with green; laying on foam roll   Flexion  Strengthening;Both;10 reps;Weights   laying on foam roll   Shoulder Flexion Weight (lbs)  2    Diagonals  Strengthening;Both;15 reps;Theraband   on foam roll   Theraband Level (Shoulder Diagonals)  Level 2 (Red)   laying on foam roll   Other Supine Exercises  lay on foam roll with pectoralis stretch holding for 15 sec  2 times      Manual Therapy   Manual Therapy  Joint mobilization    Joint Mobilization  right T8-T12 vertebrae for upslide glide in sitting and trunk rotation               PT Short Term Goals - 08/01/18 0857      PT SHORT TERM GOAL #1   Title  ----      PT SHORT TERM GOAL #2   Title  -----      PT SHORT TERM GOAL #3   Title  ----      PT SHORT TERM GOAL #4   Title  ----    Baseline  ----        PT Long Term Goals - 09/10/18 1406      PT LONG TERM GOAL #1   Title  independent with HEP and understand how to progress himself    Baseline  still learning    Time  4    Period  Weeks    Status  On-going      PT LONG TERM GOAL #2   Title  sitting with leaning back on a chair with no thoracic pain    Baseline  40% better    Time  4    Period  Weeks    Status  On-going      PT LONG TERM GOAL #3   Title  FOTO score </= 32% limitation    Time  4    Period  Weeks    Status  On-going      PT LONG TERM GOAL #4   Title  thoracic movement with daily tasks reduced to none to minimal    Baseline  50% better    Time  4    Period  Weeks    Status  On-going            Plan - 09/10/18 1408  Clinical Impression Statement  Patient does not have pain to touch in the thoracic now. Patient will have pain with his back arching on the spine. Patient has tightness in the right T8-T12 vertebrae. Patient pain is intermittent now instead of constant. Patient continues to have full thoracic rotation after therapy. Patient will benefit from skilled therapy to  improve mobility of thoracic and reduce pain.     Rehab Potential  Excellent    Clinical Impairments Affecting Rehab Potential  No lifting > 25#    PT Frequency  1x / week    PT Duration  8 weeks    PT Treatment/Interventions  Cryotherapy;Electrical Stimulation;Iontophoresis 24m/ml Dexamethasone;Therapeutic activities;Therapeutic exercise;Neuromuscular re-education;Manual techniques;Patient/family education;Dry needling    PT Next Visit Plan  thoracic mobility with foam roll and sitting on physioball; cert is up on next appoinment; patient may be ready for discharge    PT Home Exercise Plan  Access Code: [email or web address]    Consulted and Agree with Plan of Care  Patient       Patient will benefit from skilled therapeutic intervention in order to improve the following deficits and impairments:  Increased fascial restricitons, Pain, Decreased activity tolerance, Decreased strength, Decreased range of motion, Increased muscle spasms  Visit Diagnosis: Pain in thoracic spine  Muscle spasm of back  Muscle weakness (generalized)     Problem List Patient Active Problem List   Diagnosis Date Noted  . Exercise-induced asthma 07/21/2014  . Chronic cough 07/21/2014  . Upper airway cough syndrome 07/21/2014  . Allergic rhinitis 07/21/2014  . Lumbar stenosis 11/06/2013  . Urethral stricture 01/07/2013  . Prostatic hypertrophy 11/12/2012  . VOCAL CORD DISORDER 02/19/2009  . HYPERLIPIDEMIA 01/27/2009  . Snoring 01/27/2009  . HYPERTENSION 01/27/2009  . CHRONIC RHINITIS 01/27/2009  . Asthma 01/27/2009  . GERD 01/27/2009    CEarlie Counts PT 09/10/18 2:46 PM   [facility] [address] [place] [address]: [phone]  Fax:  [phone] Name: AALGER KERSTEINMRN: [id number] of Birth: [birth year] PHYSICAL THERAPY DISCHARGE SUMMARY  Visits from Start of Care: 8  Current functional level related to goals /  functional outcomes: See above. Called patient several times but no return phone calls. Patient stopped therapy due COVID-19.    Remaining deficits: See above.    Education / Equipment: HEP Plan:                                                    Patient goals were not met. Patient is being discharged due to not returning since the last visit.  Thank you for the referral. CEarlie Counts PT 11/01/18 2:07 PM  ?????

## 2018-09-12 ENCOUNTER — Encounter: Payer: Medicare PPO | Admitting: Physical Therapy

## 2018-09-24 ENCOUNTER — Ambulatory Visit: Payer: Medicare PPO | Admitting: Physical Therapy

## 2018-09-27 ENCOUNTER — Ambulatory Visit (INDEPENDENT_AMBULATORY_CARE_PROVIDER_SITE_OTHER): Payer: Medicare PPO

## 2018-09-27 ENCOUNTER — Other Ambulatory Visit: Payer: Self-pay

## 2018-09-27 ENCOUNTER — Encounter (HOSPITAL_COMMUNITY): Payer: Self-pay | Admitting: Emergency Medicine

## 2018-09-27 ENCOUNTER — Ambulatory Visit (HOSPITAL_COMMUNITY)
Admission: EM | Admit: 2018-09-27 | Discharge: 2018-09-27 | Disposition: A | Discharge status: Home / Self Care | Payer: Medicare PPO | Attending: Family Medicine | Admitting: Family Medicine

## 2018-09-27 DIAGNOSIS — M25562 Pain in left knee: Secondary | ICD-10-CM

## 2018-09-27 DIAGNOSIS — M1712 Unilateral primary osteoarthritis, left knee: Secondary | ICD-10-CM

## 2018-09-27 MED ORDER — MELOXICAM 15 MG PO TABS: 15.0000 mg | ORAL_TABLET | Freq: Every day | ORAL | 0 refills | Status: DC

## 2018-09-27 NOTE — ED Provider Notes (Signed)
[place]    CSN: [id number] Arrival date & time: 09/27/18  9381     History   Chief Complaint Chief Complaint  Patient presents with  . Knee Pain    HPI Douglas Carpenter is a 63 y.o. male.   Pt is a 63 year old male that presents with left knee pain. This has been present and worse over the last 3 weeks. No specific injury. He has prior injury to that knee many years ago. There has been some swelling. The pain is worse with bending, stooping, walking up stairs. He has not taken anything for the pain. He has been resting, using ice and elevating. No recent traveling long distance. No calf pain or hx of DVT.   ROS per HPI    Knee Pain    Past Medical History:  Diagnosis Date  . Allergy   . Arthritis    "everywhere"  . BPH (benign prostatic hypertrophy)   . Chronic rhinitis 01/27/2009  . ED (erectile dysfunction)   . GERD (gastroesophageal reflux disease)   . Headache(784.0)    migraines, as many as 3 in a week    . HYPERLIPIDEMIA 01/27/2009  . HYPERTENSION 01/27/2009  . Mild asthma    seasonal allergies, uses Proair once per yr.   . Mild obstructive sleep apnea    per pt -- mild osa test result--  no rx cpap needed  . Neuromuscular disorder ([place])   . Sleep apnea    no cpap  . Testosterone deficiency   . Urethral stricture   . Vocal cord anomaly    pt states told from [place]  doctor heard a little gurgle sound -- no farther testing done    Patient Active Problem List   Diagnosis Date Noted  . Exercise-induced asthma 07/21/2014  . Chronic cough 07/21/2014  . Upper airway cough syndrome 07/21/2014  . Allergic rhinitis 07/21/2014  . Lumbar stenosis 11/06/2013  . Urethral stricture 01/07/2013  . Prostatic hypertrophy 11/12/2012  . VOCAL CORD DISORDER 02/19/2009  . HYPERLIPIDEMIA 01/27/2009  . Snoring 01/27/2009  . HYPERTENSION 01/27/2009  . CHRONIC RHINITIS 01/27/2009  . Asthma 01/27/2009  . GERD 01/27/2009    Past Surgical History:  Procedure  Laterality Date  . COLONOSCOPY    . CYSTOSCOPY WITH URETHRAL DILATATION N/A 01/07/2013   Procedure: CYSTOSCOPY WITH URETHRAL DILATATION BALLOON DILATION ;  Surgeon: Bernestine Amass, MD;  [facility];  Service: Urology;  Laterality: N/A;  . LUMBAR LAMINECTOMY/DECOMPRESSION MICRODISCECTOMY Right 11/29/2012   Procedure: Right Lumbar Four to Five Laminectomy/ Decompression Microdiskectomy;  Surgeon: Otilio Connors, MD;  Location: [place] [facility];  Service: Neurosurgery;  Laterality: Right;  LUMBAR LAMINECTOMY/DECOMPRESSION MICRODISCECTOMY 1 LEVEL  . POLYPECTOMY    . SHOULDER ARTHROSCOPY W/ ACROMIAL REPAIR Left 08-28-2002  . UPPER GASTROINTESTINAL ENDOSCOPY         Home Medications    Prior to Admission medications   Medication Sig Start Date End Date Taking? Authorizing Provider  albuterol (PROAIR HFA) 108 (90 Base) MCG/ACT inhaler Inhale 2 puffs into the lungs every 6 (six) hours as needed for wheezing or shortness of breath. 04/18/18   [name], MD  aspirin EC 81 MG tablet Take 81 mg by mouth every morning.    [provider]  Aspirin-Acetaminophen-Caffeine (EXCEDRIN PO) Take 2 tablets by mouth 2 (two) times daily as needed (migraine).    [provider]  atenolol (TENORMIN) 25 MG tablet Take 25 mg by mouth every morning.  [provider]  atorvastatin (LIPITOR) 80 MG tablet Take 40 mg by mouth at bedtime.    [provider]  cyclobenzaprine (FLEXERIL) 10 MG tablet Take 0.5-1 tablets (5-10 mg total) by mouth 3 (three) times daily as needed for muscle spasms. 11/08/13   Jovita Gamma, MD  diclofenac (VOLTAREN) 75 MG EC tablet Take 75 mg by mouth 2 (two) times daily.     [provider]  DULoxetine (CYMBALTA) 60 MG capsule Take 1 capsule (60 mg total) by mouth daily. 11/13/17   Wendie Agreste, MD  fluticasone-salmeterol (ADVAIR HFA) [phone] MCG/ACT inhaler Inhale 2 puffs into the lungs 2 (two) times daily. 04/18/18   [name], MD  hydrOXYzine (ATARAX/VISTARIL) 25 MG tablet Take 0.5-1 tablets (12.5-25 mg total) by mouth every 8 (eight) hours as needed for itching. 12/26/17   [name], PA-C  Ketotifen Fumarate (THERA TEARS ALLERGY OP) Place 1 drop into both eyes daily as needed (dry eyes).    [provider]  meloxicam (MOBIC) 15 MG tablet Take 1 tablet (15 mg total) by mouth daily. 09/27/18   Kenza Munar, Tressia Miners A, NP  montelukast (SINGULAIR) 10 MG tablet Take 1 tablet (10 mg total) by mouth at bedtime. 04/03/17   [name], MD  Omega-3 Fatty Acids (FISH OIL) 1000 MG CPDR Take 2,000 mg by mouth 2 (two) times daily.      [provider]  omeprazole (PRILOSEC) 20 MG capsule Take 20 mg by mouth daily.     [provider]  topiramate (TOPAMAX) 25 MG tablet Take 25 mg by mouth daily.     [provider]    Family History Family History  Problem Relation Age of Onset  . Cancer Mother        lung  . Lung cancer Mother   . Heart disease Maternal Grandmother   . Heart disease Maternal Grandfather   . Cancer Maternal Grandfather        colon, prostate,lung  . Colon cancer Neg Hx   . Colon polyps Neg Hx   . Esophageal cancer Neg Hx   . Rectal cancer Neg Hx   . Stomach cancer Neg Hx     Social History Social History   Tobacco Use  . Smoking status: Former Smoker    Packs/day: 0.50    Years: 22.00    Pack years: 11.00    Types: Cigarettes    Last attempt to quit: 06/27/1988    Years since quitting: 30.2  . Smokeless tobacco: Never Used  Substance Use Topics  . Alcohol use: Yes    Alcohol/week: 3.0 standard drinks    Types: 3 Standard drinks or equivalent per week    Comment: 4-5 drinks / day- cognac   . Drug use: No     Allergies   Aspirin   Review of Systems Review of Systems   Physical Exam Triage Vital Signs ED Triage Vitals  Enc Vitals Group     BP 09/27/18 0830 132/87     Pulse Rate 09/27/18 0830 85     Resp 09/27/18 0830 18     Temp 09/27/18 0830  98.5 F (36.9 C)     Temp Source 09/27/18 0830 Oral     SpO2 09/27/18 0830 96 %     Weight --      Height --      Head Circumference --      Peak Flow --      Pain Score 09/27/18 0826 9  Pain Loc --      Pain Edu? --      Excl. in [place]? --    No data found.  Updated Vital Signs BP 132/87 (BP Location: Right Arm)   Pulse 85   Temp 98.5 F (36.9 C) (Oral)   Resp 18   SpO2 96%   Visual Acuity Right Eye Distance:   Left Eye Distance:   Bilateral Distance:    Right Eye Near:   Left Eye Near:    Bilateral Near:     Physical Exam Vitals signs and nursing note reviewed.  Constitutional:      Appearance: Normal appearance.  HENT:     Head: Normocephalic and atraumatic.     Nose: Nose normal.  Eyes:     Conjunctiva/sclera: Conjunctivae normal.  Neck:     Musculoskeletal: Normal range of motion.  Pulmonary:     Effort: Pulmonary effort is normal.  Musculoskeletal:        General: Swelling and tenderness present. No signs of injury.     Comments: Swelling and tenderness the left knee. Mostly superior to the patella. No bruising, erythema, deformity.  No laxity.   Skin:    General: Skin is warm and dry.     Findings: No rash.  Neurological:     Mental Status: He is alert.  Psychiatric:        Mood and Affect: Mood normal.      UC Treatments / Results  Labs (all labs ordered are listed, but only abnormal results are displayed) Labs Reviewed - No data to display  EKG None  Radiology Dg Knee Complete 4 Views Left  Result Date: 09/27/2018 CLINICAL DATA:  63 year old male with a history of left knee pain EXAM: LEFT KNEE - COMPLETE 4+ VIEW COMPARISON:  None. FINDINGS: No acute displaced fracture. No evidence of joint effusion. Mild medial and lateral joint space narrowing without marginal osteophyte formation. Mild patellofemoral degenerative changes. No radiopaque foreign body. IMPRESSION: Negative for acute bony abnormality. Mild osteoarthritis Electronically  Signed   By: Corrie Mckusick D.O.   On: 09/27/2018 09:07    Procedures Procedures (including critical care time)  Medications Ordered in UC Medications - No data to display  Initial Impression / Assessment and Plan / UC Course  I have reviewed the triage vital signs and the nursing notes.  Pertinent labs & imaging results that were available during my care of the patient were reviewed by me and considered in my medical decision making (see chart for details).     X ray showed mild osteoarthritis We will treat this with meloxicam RICE  Follow up as needed for continued or worsening symptoms  Final Clinical Impressions(s) / UC Diagnoses   Final diagnoses:  Osteoarthritis of left knee, unspecified osteoarthritis type     Discharge Instructions     Your knee showed osteoarthritis  We will treat this with meloxicam Keep using the ace wrap, rest, ice, elevate Follow up as needed for continued or worsening symptoms       ED Prescriptions    Medication Sig Dispense Auth. Provider   meloxicam (MOBIC) 15 MG tablet Take 1 tablet (15 mg total) by mouth daily. 30 tablet Loura Halt A, NP     Controlled Substance Prescriptions [place] Controlled Substance Registry consulted? Not Applicable   Orvan July, NP 09/27/18 [phone]

## 2018-09-27 NOTE — Discharge Instructions (Signed)
Your knee showed osteoarthritis  We will treat this with meloxicam Keep using the ace wrap, rest, ice, elevate Follow up as needed for continued or worsening symptoms

## 2018-09-27 NOTE — ED Triage Notes (Signed)
Patient reports an old injury to left knee.    Over the last 3 weeks , left knee has become more painful.  No new injury.  Knee is swollen

## 2018-10-01 ENCOUNTER — Ambulatory Visit: Payer: Medicare PPO | Admitting: Physical Therapy

## 2018-10-03 DIAGNOSIS — Z981 Arthrodesis status: Secondary | ICD-10-CM | POA: Diagnosis not present

## 2018-10-03 DIAGNOSIS — M5442 Lumbago with sciatica, left side: Secondary | ICD-10-CM | POA: Diagnosis not present

## 2018-10-03 DIAGNOSIS — Q7649 Other congenital malformations of spine, not associated with scoliosis: Secondary | ICD-10-CM | POA: Diagnosis not present

## 2018-10-03 DIAGNOSIS — M5441 Lumbago with sciatica, right side: Secondary | ICD-10-CM | POA: Diagnosis not present

## 2018-10-03 DIAGNOSIS — M47816 Spondylosis without myelopathy or radiculopathy, lumbar region: Secondary | ICD-10-CM | POA: Diagnosis not present

## 2018-10-03 DIAGNOSIS — M5136 Other intervertebral disc degeneration, lumbar region: Secondary | ICD-10-CM | POA: Diagnosis not present

## 2018-10-03 DIAGNOSIS — G8929 Other chronic pain: Secondary | ICD-10-CM | POA: Diagnosis not present

## 2018-10-12 ENCOUNTER — Telehealth (INDEPENDENT_AMBULATORY_CARE_PROVIDER_SITE_OTHER): Payer: Medicare PPO | Admitting: Family Medicine

## 2018-10-12 ENCOUNTER — Other Ambulatory Visit: Payer: Self-pay

## 2018-10-12 DIAGNOSIS — M25562 Pain in left knee: Secondary | ICD-10-CM | POA: Diagnosis not present

## 2018-10-12 MED ORDER — HYDROCODONE-ACETAMINOPHEN 5-325 MG PO TABS: 1.0000 | ORAL_TABLET | Freq: Four times a day (QID) | ORAL | 0 refills | Status: DC | PRN

## 2018-10-12 NOTE — Progress Notes (Signed)
CC- f/u left knee pain.- Was seen at the Urgent  Care on 4/2 /20 have been taking the medication Meloxicam 15mg   they prescribed but its not helping still in pain. Pain level is about a 8 out of 10. They took xray's and stated it was osteoarthritis.

## 2018-10-12 NOTE — Progress Notes (Signed)
Virtual Visit via Telephone Note  I connected with Douglas Carpenter on 10/12/18 at 4:43 PM by telephone and verified that I am speaking with the correct person using two identifiers.   I discussed the limitations, risks, security and privacy concerns of performing an evaluation and management service by telephone and the availability of in person appointments. I also discussed with the patient that there may be a patient responsible charge related to this service. The patient expressed understanding and agreed to proceed, consent obtained.   Chief complaint:  Left knee pain  History of Present Illness: Douglas Carpenter is a 63 y.o. male  Left knee pain; Evaluated at [facility] urgent care on April 2.  Note reviewed.  Present for 3 weeks, no known injury.  Did have a prior injury many years ago.  Some associated swelling, worse with bending/stooping/stairs.  No calf pain history of DVT or recent long distance travel. X-ray indicated mild medial and lateral osteoarthritis, mild patellofemoral degenerative changes. no joint effusion.  treated with meloxicam, Ace wrap, rest, ice, elevation. Dg Knee Complete 4 Views Left  Result Date: 09/27/2018 CLINICAL DATA:  63 year old male with a history of left knee pain EXAM: LEFT KNEE - COMPLETE 4+ VIEW COMPARISON:  None. FINDINGS: No acute displaced fracture. No evidence of joint effusion. Mild medial and lateral joint space narrowing without marginal osteophyte formation. Mild patellofemoral degenerative changes. No radiopaque foreign body. IMPRESSION: Negative for acute bony abnormality. Mild osteoarthritis Electronically Signed   By: Corrie Mckusick D.O.   On: 09/27/2018 09:07   Talking to patient today - Pain has been present for years, just worse past few weeks. NKI.  No prior knee injection.  Using knee brace. Persistent pain but not locking/giving way.  Followed by [facility] as well. Has not discussed knee recently, but did have some antiinflammatory rx  in past by the [place].  Min change in pain with Mobic over past 2 weeks.  Taking diclofenac 75mg  BID as well for back- on this for some time.  No current narcotic pain meds.  No recent rx - PDMP reviewed, no listings.  No hx of liver or seizure disorder.  No fever/wt loss/night sweats.       Patient Active Problem List   Diagnosis Date Noted  . Exercise-induced asthma 07/21/2014  . Chronic cough 07/21/2014  . Upper airway cough syndrome 07/21/2014  . Allergic rhinitis 07/21/2014  . Lumbar stenosis 11/06/2013  . Urethral stricture 01/07/2013  . Prostatic hypertrophy 11/12/2012  . VOCAL CORD DISORDER 02/19/2009  . HYPERLIPIDEMIA 01/27/2009  . Snoring 01/27/2009  . HYPERTENSION 01/27/2009  . CHRONIC RHINITIS 01/27/2009  . Asthma 01/27/2009  . GERD 01/27/2009   Past Medical History:  Diagnosis Date  . Allergy   . Arthritis    "everywhere"  . BPH (benign prostatic hypertrophy)   . Chronic rhinitis 01/27/2009  . ED (erectile dysfunction)   . GERD (gastroesophageal reflux disease)   . Headache(784.0)    migraines, as many as 3 in a week    . HYPERLIPIDEMIA 01/27/2009  . HYPERTENSION 01/27/2009  . Mild asthma    seasonal allergies, uses Proair once per yr.   . Mild obstructive sleep apnea    per pt -- mild osa test result--  no rx cpap needed  . Neuromuscular disorder ([place])   . Sleep apnea    no cpap  . Testosterone deficiency   . Urethral stricture   . Vocal cord anomaly    pt states told  from [place]  doctor heard a little gurgle sound -- no farther testing done   Past Surgical History:  Procedure Laterality Date  . COLONOSCOPY    . CYSTOSCOPY WITH URETHRAL DILATATION N/A 01/07/2013   Procedure: CYSTOSCOPY WITH URETHRAL DILATATION BALLOON DILATION ;  Surgeon: Bernestine Amass, MD;  Location: [facility];  Service: Urology;  Laterality: N/A;  . LUMBAR LAMINECTOMY/DECOMPRESSION MICRODISCECTOMY Right 11/29/2012   Procedure: Right Lumbar Four to Five Laminectomy/  Decompression Microdiskectomy;  Surgeon: Otilio Connors, MD;  Location: [place] [facility];  Service: Neurosurgery;  Laterality: Right;  LUMBAR LAMINECTOMY/DECOMPRESSION MICRODISCECTOMY 1 LEVEL  . POLYPECTOMY    . SHOULDER ARTHROSCOPY W/ ACROMIAL REPAIR Left 08-28-2002  . UPPER GASTROINTESTINAL ENDOSCOPY     Allergies  Allergen Reactions  . Aspirin Rash    Pt can tolerate low doses-- INTOLERANT TO HIGHER DOSES   Prior to Admission medications   Medication Sig Start Date End Date Taking? Authorizing Provider  albuterol (PROAIR HFA) 108 (90 Base) MCG/ACT inhaler Inhale 2 puffs into the lungs every 6 (six) hours as needed for wheezing or shortness of breath. 04/18/18  Yes [name], MD  aspirin EC 81 MG tablet Take 81 mg by mouth every morning.   Yes [provider]  Aspirin-Acetaminophen-Caffeine (EXCEDRIN PO) Take 2 tablets by mouth 2 (two) times daily as needed (migraine).   Yes [provider]  atenolol (TENORMIN) 25 MG tablet Take 25 mg by mouth every morning.    Yes [provider]  atorvastatin (LIPITOR) 80 MG tablet Take 40 mg by mouth at bedtime.   Yes [provider]  cyclobenzaprine (FLEXERIL) 10 MG tablet Take 0.5-1 tablets (5-10 mg total) by mouth 3 (three) times daily as needed for muscle spasms. 11/08/13  Yes Jovita Gamma, MD  diclofenac (VOLTAREN) 75 MG EC tablet Take 75 mg by mouth 2 (two) times daily.    Yes [provider]  DULoxetine (CYMBALTA) 60 MG capsule Take 1 capsule (60 mg total) by mouth daily. 11/13/17  Yes Wendie Agreste, MD  fluticasone-salmeterol (ADVAIR HFA) [phone] MCG/ACT inhaler Inhale 2 puffs into the lungs 2 (two) times daily. 04/18/18  Yes [name], MD  Ketotifen Fumarate (THERA TEARS ALLERGY OP) Place 1 drop into both eyes daily as needed (dry eyes).   Yes [provider]  meloxicam (MOBIC) 15 MG tablet Take 1 tablet (15 mg total) by mouth daily. 09/27/18  Yes Bast, Traci A, NP  montelukast  (SINGULAIR) 10 MG tablet Take 1 tablet (10 mg total) by mouth at bedtime. 04/03/17  Yes [name], MD  Omega-3 Fatty Acids (FISH OIL) 1000 MG CPDR Take 2,000 mg by mouth 2 (two) times daily.     Yes [provider]  omeprazole (PRILOSEC) 20 MG capsule Take 20 mg by mouth daily.    Yes [provider]  topiramate (TOPAMAX) 25 MG tablet Take 25 mg by mouth daily.    Yes [provider]   Social History   Socioeconomic History  . Marital status: Married    Spouse name: Not on file  . Number of children: Not on file  . Years of education: Not on file  . Highest education level: Not on file  Occupational History  . Occupation: retired  Scientific laboratory technician  . Financial resource strain: Not on file  . Food insecurity:    Worry: Not on file    Inability: Not on file  . Transportation needs:    Medical: Not on  file    Non-medical: Not on file  Tobacco Use  . Smoking status: Former Smoker    Packs/day: 0.50    Years: 22.00    Pack years: 11.00    Types: Cigarettes    Last attempt to quit: 06/27/1988    Years since quitting: 30.3  . Smokeless tobacco: Never Used  Substance and Sexual Activity  . Alcohol use: Yes    Alcohol/week: 3.0 standard drinks    Types: 3 Standard drinks or equivalent per week    Comment: 4-5 drinks / day- cognac   . Drug use: No  . Sexual activity: Not on file  Lifestyle  . Physical activity:    Days per week: Not on file    Minutes per session: Not on file  . Stress: Not on file  Relationships  . Social connections:    Talks on phone: Not on file    Gets together: Not on file    Attends religious service: Not on file    Active member of club or organization: Not on file    Attends meetings of clubs or organizations: Not on file    Relationship status: Not on file  . Intimate partner violence:    Fear of current or ex partner: Not on file    Emotionally abused: Not on file    Physically abused: Not on file    Forced sexual  activity: Not on file  Other Topics Concern  . Not on file  Social History Narrative  . Not on file     Observations/Objective:   Assessment and Plan: Left knee pain, unspecified chronicity - Plan: Ambulatory referral to Orthopedic Surgery, HYDROcodone-acetaminophen (NORCO/VICODIN) 5-325 MG tablet, DISCONTINUED: HYDROcodone-acetaminophen (NORCO/VICODIN) 5-325 MG tablet  -Longstanding left knee pain with recent worsening, no known injury.  Differential includes degenerative meniscal issue but reassuring x-ray without apparent effusion.  Denies mechanical symptoms, but persistent significant pain.  No fevers or other infectious symptoms.  Minimal change with addition of meloxicam, but stressed importance of discontinuation given current use of diclofenac.  -Continue bracing, diclofenac as used for back.  -Short-term hydrocodone prescribed, PDMP reviewed without concerns.  -Refer to orthopedics for further evaluation and plan.  Potentially may need trial of injection versus advanced imaging  Follow Up Instructions: As needed.    I discussed the assessment and treatment plan with the patient. The patient was provided an opportunity to ask questions and all were answered. The patient agreed with the plan and demonstrated an understanding of the instructions.   The patient was advised to call back or seek an in-person evaluation if the symptoms worsen or if the condition fails to improve as anticipated.  I provided 14 minutes of non-face-to-face time during this encounter.  Signed,   Merri Ray, MD [facility].  10/12/18

## 2018-10-12 NOTE — Patient Instructions (Addendum)
Stop the meloxicam as diclofenac works in a similar way.  Tylenol over the counter during the day as needed. For more severe pain I did write for a short-term supply for hydrocodone until he can be seen by orthopedics.  Take that only as needed for more severe pain.  Continue brace as needed, see other information on knee pain below.  If acute worsening symptoms prior to seeing orthopedics, recommend recheck.  Take care.     Acute Knee Pain, Adult Acute knee pain is sudden and may be caused by damage, swelling, or irritation of the muscles and tissues that support your knee. The injury may result from:  A fall.  An injury to your knee from twisting motions.  A hit to the knee.  Infection. Acute knee pain may go away on its own with time and rest. If it does not, your health care provider may order tests to find the cause of the pain. These may include:  Imaging tests, such as an X-ray, MRI, or ultrasound.  Joint aspiration. In this test, fluid is removed from the knee.  Arthroscopy. In this test, a lighted tube is inserted into the knee and an image is projected onto a TV screen.  Biopsy. In this test, a sample of tissue is removed from the body and studied under a microscope. Follow these instructions at home: Pay attention to any changes in your symptoms. Take these actions to relieve your pain. If you have a knee sleeve or brace:   Wear the sleeve or brace as told by your health care provider. Remove it only as told by your health care provider.  Loosen the sleeve or brace if your toes tingle, become numb, or turn cold and blue.  Keep the sleeve or brace clean.  If the sleeve or brace is not waterproof: ? Do not let it get wet. ? Cover it with a watertight covering when you take a bath or shower. Activity  Rest your knee.  Do not do things that cause pain or make pain worse.  Avoid high-impact activities or exercises, such as running, jumping rope, or doing jumping  jacks.  Work with a physical therapist to make a safe exercise program, as recommended by your health care provider. Do exercises as told by your physical therapist. Managing pain, stiffness, and swelling   If directed, put ice on the knee: ? Put ice in a plastic bag. ? Place a towel between your skin and the bag. ? Leave the ice on for 20 minutes, 2-3 times a day.  If directed, use an elastic bandage to put pressure (compression) on your injured knee. This may control swelling, give support, and help with discomfort. General instructions  Take over-the-counter and prescription medicines only as told by your health care provider.  Raise (elevate) your knee above the level of your heart when you are sitting or lying down.  Sleep with a pillow under your knee.  Do not use any products that contain nicotine or tobacco, such as cigarettes, e-cigarettes, and chewing tobacco. These can delay healing. If you need help quitting, ask your health care provider.  If you are overweight, work with your health care provider and a dietitian to set a weight-loss goal that is healthy and reasonable for you. Extra weight can put pressure on your knee.  Keep all follow-up visits as told by your health care provider. This is important. Contact a health care provider if:  Your knee pain continues, changes, or gets  worse.  You have a fever along with knee pain.  Your knee feels warm to the touch.  Your knee buckles or locks up. Get help right away if:  Your knee swells, and the swelling becomes worse.  You cannot move your knee.  You have severe pain in your knee. Summary  Acute knee pain can be caused by a fall, an injury, an infection, or damage, swelling, or irritation of the tissues that support your knee.  Your health care provider may perform tests to find out the cause of the pain.  Pay attention to any changes in your symptoms. Relieve your pain with rest, medicines, light activity,  and use of ice.  Get help if your pain continues or becomes worse, your knee swells, or you cannot move your knee. This information is not intended to replace advice given to you by your health care provider. Make sure you discuss any questions you have with your health care provider. Document Released: 04/10/2007 Document Revised: 11/23/2017 Document Reviewed: 11/23/2017 Elsevier Interactive Patient Education  [employer].     If you have lab work done today you will be contacted with your lab results within the next 2 weeks.  If you have not heard from [place] then please contact us. The fastest way to get your results is to register for My Chart.   IF you received an x-ray today, you will receive an invoice from [facility]. Please contact [facility] at [phone] with questions or concerns regarding your invoice.   IF you received labwork today, you will receive an invoice from [facility]. Please contact [facility] at [phone] with questions or concerns regarding your invoice.   Our billing staff will not be able to assist you with questions regarding bills from these companies.  You will be contacted with the lab results as soon as they are available. The fastest way to get your results is to activate your My Chart account. Instructions are located on the last page of this paperwork. If you have not heard from [place] regarding the results in 2 weeks, please contact this office.

## 2018-10-15 ENCOUNTER — Ambulatory Visit: Payer: Self-pay

## 2018-10-15 ENCOUNTER — Ambulatory Visit (HOSPITAL_COMMUNITY)
Admission: EM | Admit: 2018-10-15 | Discharge: 2018-10-15 | Disposition: A | Discharge status: Home / Self Care | Payer: Medicare PPO | Attending: Physician Assistant | Admitting: Physician Assistant

## 2018-10-15 ENCOUNTER — Encounter (HOSPITAL_COMMUNITY): Payer: Self-pay

## 2018-10-15 ENCOUNTER — Other Ambulatory Visit: Payer: Self-pay

## 2018-10-15 DIAGNOSIS — H1033 Unspecified acute conjunctivitis, bilateral: Secondary | ICD-10-CM | POA: Insufficient documentation

## 2018-10-15 DIAGNOSIS — H1131 Conjunctival hemorrhage, right eye: Secondary | ICD-10-CM | POA: Insufficient documentation

## 2018-10-15 DIAGNOSIS — R21 Rash and other nonspecific skin eruption: Secondary | ICD-10-CM | POA: Diagnosis not present

## 2018-10-15 LAB — CBC WITH DIFFERENTIAL/PLATELET
Abs Immature Granulocytes: 0.01 10*3/uL (ref 0.00–0.07)
Basophils Absolute: 0 10*3/uL (ref 0.0–0.1)
Basophils Relative: 1 %
Eosinophils Absolute: 0.3 10*3/uL (ref 0.0–0.5)
Eosinophils Relative: 8 %
HCT: 42.8 % (ref 39.0–52.0)
Hemoglobin: 14 g/dL (ref 13.0–17.0)
Immature Granulocytes: 0 %
Lymphocytes Relative: 34 %
Lymphs Abs: 1.2 10*3/uL (ref 0.7–4.0)
MCH: 29 pg (ref 26.0–34.0)
MCHC: 32.7 g/dL (ref 30.0–36.0)
MCV: 88.6 fL (ref 80.0–100.0)
Monocytes Absolute: 0.5 10*3/uL (ref 0.1–1.0)
Monocytes Relative: 15 %
Neutro Abs: 1.5 10*3/uL — ABNORMAL LOW (ref 1.7–7.7)
Neutrophils Relative %: 42 %
Platelets: 187 10*3/uL (ref 150–400)
RBC: 4.83 MIL/uL (ref 4.22–5.81)
RDW: 13.2 % (ref 11.5–15.5)
WBC: 3.6 10*3/uL — ABNORMAL LOW (ref 4.0–10.5)
nRBC: 0 % (ref 0.0–0.2)

## 2018-10-15 LAB — COMPREHENSIVE METABOLIC PANEL
ALT: 25 U/L (ref 0–44)
AST: 28 U/L (ref 15–41)
Albumin: 3.9 g/dL (ref 3.5–5.0)
Alkaline Phosphatase: 46 U/L (ref 38–126)
Anion gap: 13 (ref 5–15)
BUN: 19 mg/dL (ref 8–23)
CO2: 21 mmol/L — ABNORMAL LOW (ref 22–32)
Calcium: 8.7 mg/dL — ABNORMAL LOW (ref 8.9–10.3)
Chloride: 107 mmol/L (ref 98–111)
Creatinine, Ser: 1.12 mg/dL (ref 0.61–1.24)
GFR calc Af Amer: >60 mL/min (ref >60)
GFR calc non Af Amer: >60 mL/min (ref >60)
Glucose, Bld: 99 mg/dL (ref 70–99)
Potassium: 4 mmol/L (ref 3.5–5.1)
Sodium: 141 mmol/L (ref 135–145)
Total Bilirubin: 0.7 mg/dL (ref 0.3–1.2)
Total Protein: 7.1 g/dL (ref 6.5–8.1)

## 2018-10-15 MED ORDER — OLOPATADINE HCL 0.1 % OP SOLN: 1.0000 [drp] | Freq: Two times a day (BID) | OPHTHALMIC | 0 refills | Status: DC

## 2018-10-15 NOTE — ED Provider Notes (Signed)
[place]   CSN: [id number] Arrival date & time: 10/15/18  5732    History   Chief Complaint Chief Complaint  Patient presents with  . Rash  . Eye Problem    HPI Douglas Carpenter is a 63 y.o. male.   64 year old comes in for 1 week history of rash and eye redness. States rash first started on left medial thigh, then spread down the leg, now with similar rash to the right leg. Denies itching, irritation, pain. Denies spreading erythema, warmth, fever. States does work in the yard, but wears long pants and does not have rashes on upper extremity. Denies new product changes. States bilateral eye redness started about the same time. Denies pain, itching, vision changes, photophobia. Denies contact lens use, wears reading glasses. Denies rhinorrhea, nasal congestion, cough. Denies fever, chills, night sweats. Has not tried anything for the symptoms.  States was given mobic 09/27/2018 for his left knee pain, and was told to discontinue by PCP 10/12/2018 given he is also on diclofenac. He was given Norco for his left knee pain instead, and states took one yesterday. Otherwise, no medication changes.      Past Medical History:  Diagnosis Date  . Allergy   . Arthritis    "everywhere"  . BPH (benign prostatic hypertrophy)   . Chronic rhinitis 01/27/2009  . ED (erectile dysfunction)   . GERD (gastroesophageal reflux disease)   . Headache(784.0)    migraines, as many as 3 in a week    . HYPERLIPIDEMIA 01/27/2009  . HYPERTENSION 01/27/2009  . Mild asthma    seasonal allergies, uses Proair once per yr.   . Mild obstructive sleep apnea    per pt -- mild osa test result--  no rx cpap needed  . Neuromuscular disorder ([place])   . Sleep apnea    no cpap  . Testosterone deficiency   . Urethral stricture   . Vocal cord anomaly    pt states told from [place]  doctor heard a little gurgle sound -- no farther testing done    Patient Active Problem List   Diagnosis Date Noted  .  Exercise-induced asthma 07/21/2014  . Chronic cough 07/21/2014  . Upper airway cough syndrome 07/21/2014  . Allergic rhinitis 07/21/2014  . Lumbar stenosis 11/06/2013  . Urethral stricture 01/07/2013  . Prostatic hypertrophy 11/12/2012  . VOCAL CORD DISORDER 02/19/2009  . HYPERLIPIDEMIA 01/27/2009  . Snoring 01/27/2009  . HYPERTENSION 01/27/2009  . CHRONIC RHINITIS 01/27/2009  . Asthma 01/27/2009  . GERD 01/27/2009    Past Surgical History:  Procedure Laterality Date  . COLONOSCOPY    . CYSTOSCOPY WITH URETHRAL DILATATION N/A 01/07/2013   Procedure: CYSTOSCOPY WITH URETHRAL DILATATION BALLOON DILATION ;  Surgeon: Bernestine Amass, MD;  Location[facility];  Service: Urology;  Laterality: N/A;  . LUMBAR LAMINECTOMY/DECOMPRESSION MICRODISCECTOMY Right 11/29/2012   Procedure: Right Lumbar Four to Five Laminectomy/ Decompression Microdiskectomy;  Surgeon: Otilio Connors, MD;  Location: [place] [facility];  Service: Neurosurgery;  Laterality: Right;  LUMBAR LAMINECTOMY/DECOMPRESSION MICRODISCECTOMY 1 LEVEL  . POLYPECTOMY    . SHOULDER ARTHROSCOPY W/ ACROMIAL REPAIR Left 08-28-2002  . UPPER GASTROINTESTINAL ENDOSCOPY         Home Medications    Prior to Admission medications   Medication Sig Start Date End Date Taking? Authorizing Provider  albuterol (PROAIR HFA) 108 (90 Base) MCG/ACT inhaler Inhale 2 puffs into the lungs every 6 (six) hours as needed for wheezing or shortness  of breath. 04/18/18   [name], MD  aspirin EC 81 MG tablet Take 81 mg by mouth every morning.    [provider]  Aspirin-Acetaminophen-Caffeine (EXCEDRIN PO) Take 2 tablets by mouth 2 (two) times daily as needed (migraine).    [provider]  atenolol (TENORMIN) 25 MG tablet Take 25 mg by mouth every morning.     [provider]  atorvastatin (LIPITOR) 80 MG tablet Take 40 mg by mouth at bedtime.    [provider]  cyclobenzaprine (FLEXERIL) 10 MG tablet Take  0.5-1 tablets (5-10 mg total) by mouth 3 (three) times daily as needed for muscle spasms. 11/08/13   Jovita Gamma, MD  diclofenac (VOLTAREN) 75 MG EC tablet Take 75 mg by mouth 2 (two) times daily.     [provider]  DULoxetine (CYMBALTA) 60 MG capsule Take 1 capsule (60 mg total) by mouth daily. 11/13/17   Wendie Agreste, MD  fluticasone-salmeterol (ADVAIR HFA) [phone] MCG/ACT inhaler Inhale 2 puffs into the lungs 2 (two) times daily. 04/18/18   [name], MD  HYDROcodone-acetaminophen (NORCO/VICODIN) 5-325 MG tablet Take 1 tablet by mouth every 6 (six) hours as needed for moderate pain. 10/12/18   Wendie Agreste, MD  Ketotifen Fumarate (THERA TEARS ALLERGY OP) Place 1 drop into both eyes daily as needed (dry eyes).    [provider]  montelukast (SINGULAIR) 10 MG tablet Take 1 tablet (10 mg total) by mouth at bedtime. 04/03/17   [name], MD  olopatadine (PATADAY) 0.1 % ophthalmic solution Place 1 drop into both eyes 2 (two) times daily. 10/15/18   Tasia Catchings, Alashia Brownfield V, PA-C  Omega-3 Fatty Acids (FISH OIL) 1000 MG CPDR Take 2,000 mg by mouth 2 (two) times daily.      [provider]  omeprazole (PRILOSEC) 20 MG capsule Take 20 mg by mouth daily.     [provider]  topiramate (TOPAMAX) 25 MG tablet Take 25 mg by mouth daily.     [provider]    Family History Family History  Problem Relation Age of Onset  . Cancer Mother        lung  . Lung cancer Mother   . Heart disease Maternal Grandmother   . Heart disease Maternal Grandfather   . Cancer Maternal Grandfather        colon, prostate,lung  . Colon cancer Neg Hx   . Colon polyps Neg Hx   . Esophageal cancer Neg Hx   . Rectal cancer Neg Hx   . Stomach cancer Neg Hx     Social History Social History   Tobacco Use  . Smoking status: Former Smoker    Packs/day: 0.50    Years: 22.00    Pack years: 11.00    Types: Cigarettes    Last attempt to quit: 06/27/1988    Years since  quitting: 30.3  . Smokeless tobacco: Never Used  Substance Use Topics  . Alcohol use: Yes    Alcohol/week: 3.0 standard drinks    Types: 3 Standard drinks or equivalent per week    Comment: 4-5 drinks / day- cognac   . Drug use: No     Allergies   Aspirin   Review of Systems Review of Systems  Reason unable to perform ROS: See HPI as above.     Physical Exam Triage Vital Signs ED Triage Vitals  Enc Vitals Group     BP 10/15/18 0820 139/86     Pulse Rate 10/15/18  0820 83     Resp 10/15/18 0820 17     Temp 10/15/18 0820 98 F (36.7 C)     Temp Source 10/15/18 0820 Oral     SpO2 10/15/18 0820 98 %     Weight --      Height --      Head Circumference --      Peak Flow --      Pain Score 10/15/18 0819 7     Pain Loc --      Pain Edu? --      Excl. in [place]? --    No data found.  Updated Vital Signs BP 139/86 (BP Location: Right Arm)   Pulse 83   Temp 98 F (36.7 C) (Oral)   Resp 17   SpO2 98%    Physical Exam Constitutional:      General: He is not in acute distress.    Appearance: He is well-developed. He is not diaphoretic.  HENT:     Head: Normocephalic and atraumatic.  Eyes:     General: Lids are normal.     Conjunctiva/sclera:     Right eye: Right conjunctiva is injected. Hemorrhage present.     Left eye: Left conjunctiva is injected.     Pupils: Pupils are equal, round, and reactive to light.  Skin:    Comments: See picture below. Rash is not raised. No tenderness, fluctuance. Nonblanchable.   Neurological:     Mental Status: He is alert and oriented to person, place, and time.               UC Treatments / Results  Labs (all labs ordered are listed, but only abnormal results are displayed) Labs Reviewed  CBC WITH DIFFERENTIAL/PLATELET - Abnormal; Notable for the following components:      Result Value   WBC 3.6 (*)    Neutro Abs 1.5 (*)    All other components within normal limits  COMPREHENSIVE METABOLIC PANEL - Abnormal;  Notable for the following components:   CO2 21 (*)    Calcium 8.7 (*)    All other components within normal limits    EKG None  Radiology No results found.  Procedures Procedures (including critical care time)  Medications Ordered in UC Medications - No data to display  Initial Impression / Assessment and Plan / UC Course  I have reviewed the triage vital signs and the nursing notes.  Pertinent labs & imaging results that were available during my care of the patient were reviewed by me and considered in my medical decision making (see chart for details).    Discussed case with Dr Raeford Razor. No alarming signs on exam, will draw CBC, CMP to assess nonpruritic rash. Will have patient continue to monitor, and may need to follow up with PCP for biopsy for further evaluation if symptoms not improving. Return precautions given.  Subconjunctival hemorrhage with bilateral eye injection. Will provide pataday for possible allergic conjunctivitis. Lid scrub and warm compress. Return precautions given. Patient expresses understanding and agrees to plan.  Case discussed with Dr Raeford Razor, who agrees to plan.  CBC/CMP without alarming results. Patient to continue to monitor and follow up with PCP as discussed.   Final Clinical Impressions(s) / UC Diagnoses   Final diagnoses:  Rash  Subconjunctival hematoma, right  Acute conjunctivitis of both eyes, unspecified acute conjunctivitis type    ED Prescriptions    Medication Sig Dispense Auth. Provider   olopatadine (PATADAY) 0.1 % ophthalmic solution Place  1 drop into both eyes 2 (two) times daily. 5 mL Tobin Chad, [place] 10/15/18 1233

## 2018-10-15 NOTE — ED Triage Notes (Signed)
Patient presents to Urgent Care with complaints of rash mostly on left leg and some on right leg since a few days ago. Patient states he also has bilateral eye redness, some bursted blood vessels in right eye.

## 2018-10-15 NOTE — Discharge Instructions (Signed)
As discussed, no alarming signs, however, no obvious cause of the rash. Blood work drawn today for evaluation. Continue to monitor rash, do not put anything new on the rash. If symptoms not improving, please follow up with PCP for possible biopsy of the rash. If develop pain, spreading redness, warmth, follow up for further evaluation needed.   Unsure if eyes are related to the rash. However, will try pataday eyedrop to see if it is allergy related. Lid scrubs and warm compress to see if left eye will decrease in tearing. Monitor for any worsening of symptoms, changes in vision, sensitivity to light, eye swelling, painful eye movement, follow up with ophthalmology for further evaluation.

## 2018-10-15 NOTE — ED Notes (Signed)
Patient verbalizes understanding of discharge instructions. Opportunity for questioning and answers were provided. Patient discharged from [facility] by RN.  

## 2018-10-16 ENCOUNTER — Encounter: Payer: Self-pay | Admitting: Family Medicine

## 2018-10-16 ENCOUNTER — Telehealth: Payer: Self-pay | Admitting: Family Medicine

## 2018-10-16 ENCOUNTER — Other Ambulatory Visit: Payer: Self-pay

## 2018-10-16 ENCOUNTER — Ambulatory Visit (INDEPENDENT_AMBULATORY_CARE_PROVIDER_SITE_OTHER): Payer: Medicare PPO | Admitting: Family Medicine

## 2018-10-16 ENCOUNTER — Telehealth: Payer: Medicare PPO | Admitting: Family Medicine

## 2018-10-16 VITALS — BP 123/78 | HR 73 | Temp 98.3°F | Resp 16 | Wt 231.0 lb

## 2018-10-16 DIAGNOSIS — R21 Rash and other nonspecific skin eruption: Secondary | ICD-10-CM | POA: Diagnosis not present

## 2018-10-16 DIAGNOSIS — H1131 Conjunctival hemorrhage, right eye: Secondary | ICD-10-CM

## 2018-10-16 DIAGNOSIS — M7989 Other specified soft tissue disorders: Secondary | ICD-10-CM

## 2018-10-16 DIAGNOSIS — M25562 Pain in left knee: Secondary | ICD-10-CM | POA: Diagnosis not present

## 2018-10-16 DIAGNOSIS — L237 Allergic contact dermatitis due to plants, except food: Secondary | ICD-10-CM

## 2018-10-16 DIAGNOSIS — L959 Vasculitis limited to the skin, unspecified: Secondary | ICD-10-CM

## 2018-10-16 MED ORDER — PREDNISONE 20 MG PO TABS: 40.0000 mg | ORAL_TABLET | Freq: Every day | ORAL | 0 refills | Status: DC

## 2018-10-16 NOTE — Progress Notes (Signed)
Virtual Visit via phone note  I connected with Douglas Carpenter on 10/16/18 at 10:43 AM by phone - unable to connect video. verified that I am speaking with the correct person using two identifiers.   I discussed the limitations, risks, security and privacy concerns of performing an evaluation and management service by telephone and the availability of in person appointments. I also discussed with the patient that there may be a patient responsible charge related to this service. The patient expressed understanding and agreed to proceed, consent obtained  Chief complaint: Ankle. Knee swelling, rash   History of Present Illness: Douglas Carpenter is a 63 y.o. male  L knee, ankle swelling: Left knee pain discussed at telemedicine visit 4 days ago.  Had been seen at [facility] urgent care April 2.  X-ray without fracture or evidence of joint effusion.  Mild medial lateral joint space narrowing.  Had reported longstanding knee pain for years with worsening previous few weeks.  Has been combining both Voltaren and Mobic.  Agreed on short-term treatment with hydrocodone until he was seen by orthopedics.  Referral placed.  Ortho called this am - appt in 3 days in office at ortho care.  Hydrocodone 1 initially, 2 yesterday. Helped to ease pain, but knee seems to be same.   Swelling in lower thigh down to ankle of left leg - entire leg appears to be bigger on left side - past month and a half. About the same swelling. Heat and ice to knee primarily. Trouble seeing ankle d/t ankle swelling. No calf pain, but leg seems bigger - knee to left ankle. Most of pain in knee, some soreness in ankle.  No hx of DVT.  No recent prolonged car travel or air travel, no recent calf pain. No recent surgery or immobilization.  No new chest pains or dyspnea.  No fever.  Rash: Inside of left knee area at joint, running down leg to ankle. ?Bruising appearance, and small red spots. Started past 3-4 days. Small rash on other leg more  visible today.  Only moves to ankle.  Poison ivy on R arm today?   Patient Active Problem List   Diagnosis Date Noted  . Exercise-induced asthma 07/21/2014  . Chronic cough 07/21/2014  . Upper airway cough syndrome 07/21/2014  . Allergic rhinitis 07/21/2014  . Lumbar stenosis 11/06/2013  . Urethral stricture 01/07/2013  . Prostatic hypertrophy 11/12/2012  . VOCAL CORD DISORDER 02/19/2009  . HYPERLIPIDEMIA 01/27/2009  . Snoring 01/27/2009  . HYPERTENSION 01/27/2009  . CHRONIC RHINITIS 01/27/2009  . Asthma 01/27/2009  . GERD 01/27/2009   Past Medical History:  Diagnosis Date  . Allergy   . Arthritis    "everywhere"  . BPH (benign prostatic hypertrophy)   . Chronic rhinitis 01/27/2009  . ED (erectile dysfunction)   . GERD (gastroesophageal reflux disease)   . Headache(784.0)    migraines, as many as 3 in a week    . HYPERLIPIDEMIA 01/27/2009  . HYPERTENSION 01/27/2009  . Mild asthma    seasonal allergies, uses Proair once per yr.   . Mild obstructive sleep apnea    per pt -- mild osa test result--  no rx cpap needed  . Neuromuscular disorder ([place])   . Sleep apnea    no cpap  . Testosterone deficiency   . Urethral stricture   . Vocal cord anomaly    pt states told from [place]  doctor heard a little gurgle sound -- no farther testing done   Past  Surgical History:  Procedure Laterality Date  . COLONOSCOPY    . CYSTOSCOPY WITH URETHRAL DILATATION N/A 01/07/2013   Procedure: CYSTOSCOPY WITH URETHRAL DILATATION BALLOON DILATION ;  Surgeon: Bernestine Amass, MD;  [facility];  Service: Urology;  Laterality: N/A;  . LUMBAR LAMINECTOMY/DECOMPRESSION MICRODISCECTOMY Right 11/29/2012   Procedure: Right Lumbar Four to Five Laminectomy/ Decompression Microdiskectomy;  Surgeon: Otilio Connors, MD;  Location: [place] [facility];  Service: Neurosurgery;  Laterality: Right;  LUMBAR LAMINECTOMY/DECOMPRESSION MICRODISCECTOMY 1 LEVEL  . POLYPECTOMY    . SHOULDER ARTHROSCOPY W/  ACROMIAL REPAIR Left 08-28-2002  . UPPER GASTROINTESTINAL ENDOSCOPY     Allergies  Allergen Reactions  . Aspirin Rash    Pt can tolerate low doses-- INTOLERANT TO HIGHER DOSES   Prior to Admission medications   Medication Sig Start Date End Date Taking? Authorizing Provider  albuterol (PROAIR HFA) 108 (90 Base) MCG/ACT inhaler Inhale 2 puffs into the lungs every 6 (six) hours as needed for wheezing or shortness of breath. 04/18/18  Yes [name], MD  aspirin EC 81 MG tablet Take 81 mg by mouth every morning.   Yes [provider]  Aspirin-Acetaminophen-Caffeine (EXCEDRIN PO) Take 2 tablets by mouth 2 (two) times daily as needed (migraine).   Yes [provider]  atenolol (TENORMIN) 25 MG tablet Take 25 mg by mouth every morning.    Yes [provider]  atorvastatin (LIPITOR) 80 MG tablet Take 40 mg by mouth at bedtime.   Yes [provider]  cyclobenzaprine (FLEXERIL) 10 MG tablet Take 0.5-1 tablets (5-10 mg total) by mouth 3 (three) times daily as needed for muscle spasms. 11/08/13  Yes Jovita Gamma, MD  diclofenac (VOLTAREN) 75 MG EC tablet Take 75 mg by mouth 2 (two) times daily.    Yes [provider]  DULoxetine (CYMBALTA) 60 MG capsule Take 1 capsule (60 mg total) by mouth daily. 11/13/17  Yes Wendie Agreste, MD  fluticasone-salmeterol (ADVAIR HFA) [phone] MCG/ACT inhaler Inhale 2 puffs into the lungs 2 (two) times daily. 04/18/18  Yes [name], MD  HYDROcodone-acetaminophen (NORCO/VICODIN) 5-325 MG tablet Take 1 tablet by mouth every 6 (six) hours as needed for moderate pain. 10/12/18  Yes Wendie Agreste, MD  Ketotifen Fumarate (THERA TEARS ALLERGY OP) Place 1 drop into both eyes daily as needed (dry eyes).   Yes [provider]  montelukast (SINGULAIR) 10 MG tablet Take 1 tablet (10 mg total) by mouth at bedtime. 04/03/17  Yes [name], MD  olopatadine (PATADAY) 0.1 % ophthalmic solution Place 1 drop into both eyes 2  (two) times daily. 10/15/18  Yes Yu, Amy V, PA-C  Omega-3 Fatty Acids (FISH OIL) 1000 MG CPDR Take 2,000 mg by mouth 2 (two) times daily.     Yes [provider]  omeprazole (PRILOSEC) 20 MG capsule Take 20 mg by mouth daily.    Yes [provider]  topiramate (TOPAMAX) 25 MG tablet Take 25 mg by mouth daily.    Yes [provider]   Social History   Socioeconomic History  . Marital status: Married    Spouse name: Not on file  . Number of children: Not on file  . Years of education: Not on file  . Highest education level: Not on file  Occupational History  . Occupation: retired  Scientific laboratory technician  . Financial resource strain: Not on file  . Food insecurity:    Worry: Not on file    Inability: Not  on file  . Transportation needs:    Medical: Not on file    Non-medical: Not on file  Tobacco Use  . Smoking status: Former Smoker    Packs/day: 0.50    Years: 22.00    Pack years: 11.00    Types: Cigarettes    Last attempt to quit: 06/27/1988    Years since quitting: 30.3  . Smokeless tobacco: Never Used  Substance and Sexual Activity  . Alcohol use: Yes    Alcohol/week: 3.0 standard drinks    Types: 3 Standard drinks or equivalent per week    Comment: 4-5 drinks / day- cognac   . Drug use: No  . Sexual activity: Not on file  Lifestyle  . Physical activity:    Days per week: Not on file    Minutes per session: Not on file  . Stress: Not on file  Relationships  . Social connections:    Talks on phone: Not on file    Gets together: Not on file    Attends religious service: Not on file    Active member of club or organization: Not on file    Attends meetings of clubs or organizations: Not on file    Relationship status: Not on file  . Intimate partner violence:    Fear of current or ex partner: Not on file    Emotionally abused: Not on file    Physically abused: Not on file    Forced sexual activity: Not on file  Other Topics Concern  . Not on file   Social History Narrative  . Not on file    Observations/Objective:   Assessment and Plan: No diagnosis found.   Follow Up Instructions:    I discussed the assessment and treatment plan with the patient. The patient was provided an opportunity to ask questions and all were answered. The patient agreed with the plan and demonstrated an understanding of the instructions.   The patient was advised to call back or seek an in-person evaluation if the symptoms worsen or if the condition fails to improve as anticipated.  I provided 15 minutes of non-face-to-face time during this encounter.   Wendie Agreste, MD

## 2018-10-16 NOTE — Progress Notes (Signed)
Subjective:    Patient ID: MARGIE BRINK, male    DOB: 23-Feb-1956, 63 y.o.   MRN: [id number]  HPI DEEPAK BLESS is a 63 y.o. male Presents today for: Chief Complaint  Patient presents with  . Edema    Left leg swollen. Patient was a tele-med visit this morning but changed to in office visit   initial telemed visit this am - reverted to in office visit d/t symptoms.   L knee, ankle swelling: Left knee pain discussed at telemedicine visit 4 days ago.  Had been seen at [facility] urgent care April 2.  X-ray without fracture or evidence of joint effusion.  Mild medial lateral joint space narrowing.  Had reported longstanding knee pain for years with worsening previous few weeks.  Has been combining both Voltaren and Mobic.  Agreed on short-term treatment with hydrocodone until he was seen by orthopedics.  Referral placed.  Ortho called this am - appt in 3 days in office at ortho care.  Hydrocodone 1 initially, 2 yesterday. Helped to ease pain, but knee seems to be same.   Swelling in lower thigh down to ankle of left leg - entire leg appears to be bigger on left side - past month and a half. About the same swelling. Heat and ice to knee primarily. Trouble seeing ankle d/t ankle swelling. No calf pain, but leg seems bigger - knee to left ankle. Most of pain in knee, some soreness in ankle.  No hx of DVT.  No recent prolonged car travel or air travel, no recent calf pain. No recent surgery or immobilization.  No new chest pains or dyspnea.  No fever.  Rash: Stared 4 days ago on inside of left knee. Reddish patch. Not itching. Small bumps down left leg next day. Inside of left knee area at joint, running down leg to ankle. .  Small rash on other leg more visible yesterday, and eyes bloodshot yesterday.  Seen at Urgent Care yesterday.  Only new med recently is hydrocodone prescribed 5 days ago, but had not stared until 2 days ago - rash started BEFORE use of hydrocodone. meloxicam was started 4/2,  but stopped last Friday. No tick bites/exposure.   Min itching if pants rub against area only.   Lab Results  Component Value Date   WBC 3.6 (L) 10/15/2018   HGB 14.0 10/15/2018   HCT 42.8 10/15/2018   MCV 88.6 10/15/2018   PLT 187 10/15/2018  normal plt yesterday, normal lft and creatinine at that time as well.   Has had some watery eyes. Allergic conjunctivitis with subconjnucticva hemorrhage suspected and pataday rx yesterday. Has helped redness on left more than right.   Poison ivy rash started on R arm yesterday, no poison ivy anywhere else.   No hematuria,  No fever. Does not feel sick.  Chronic HA, no recent change. No neck stiffness.no photo/phonophobia.    Patient Active Problem List   Diagnosis Date Noted  . Exercise-induced asthma 07/21/2014  . Chronic cough 07/21/2014  . Upper airway cough syndrome 07/21/2014  . Allergic rhinitis 07/21/2014  . Lumbar stenosis 11/06/2013  . Urethral stricture 01/07/2013  . Prostatic hypertrophy 11/12/2012  . VOCAL CORD DISORDER 02/19/2009  . HYPERLIPIDEMIA 01/27/2009  . Snoring 01/27/2009  . HYPERTENSION 01/27/2009  . CHRONIC RHINITIS 01/27/2009  . Asthma 01/27/2009  . GERD 01/27/2009   Past Medical History:  Diagnosis Date  . Allergy   . Arthritis    "everywhere"  . BPH (benign prostatic  hypertrophy)   . Chronic rhinitis 01/27/2009  . ED (erectile dysfunction)   . GERD (gastroesophageal reflux disease)   . Headache(784.0)    migraines, as many as 3 in a week    . HYPERLIPIDEMIA 01/27/2009  . HYPERTENSION 01/27/2009  . Mild asthma    seasonal allergies, uses Proair once per yr.   . Mild obstructive sleep apnea    per pt -- mild osa test result--  no rx cpap needed  . Neuromuscular disorder ([place])   . Sleep apnea    no cpap  . Testosterone deficiency   . Urethral stricture   . Vocal cord anomaly    pt states told from [place]  doctor heard a little gurgle sound -- no farther testing done   Past Surgical History:   Procedure Laterality Date  . COLONOSCOPY    . CYSTOSCOPY WITH URETHRAL DILATATION N/A 01/07/2013   Procedure: CYSTOSCOPY WITH URETHRAL DILATATION BALLOON DILATION ;  Surgeon: Bernestine Amass, MD;  Location: [facility];  Service: Urology;  Laterality: N/A;  . LUMBAR LAMINECTOMY/DECOMPRESSION MICRODISCECTOMY Right 11/29/2012   Procedure: Right Lumbar Four to Five Laminectomy/ Decompression Microdiskectomy;  Surgeon: Otilio Connors, MD;  Location: [place] [facility];  Service: Neurosurgery;  Laterality: Right;  LUMBAR LAMINECTOMY/DECOMPRESSION MICRODISCECTOMY 1 LEVEL  . POLYPECTOMY    . SHOULDER ARTHROSCOPY W/ ACROMIAL REPAIR Left 08-28-2002  . UPPER GASTROINTESTINAL ENDOSCOPY     Allergies  Allergen Reactions  . Aspirin Rash    Pt can tolerate low doses-- INTOLERANT TO HIGHER DOSES   Prior to Admission medications   Medication Sig Start Date End Date Taking? Authorizing Provider  albuterol (PROAIR HFA) 108 (90 Base) MCG/ACT inhaler Inhale 2 puffs into the lungs every 6 (six) hours as needed for wheezing or shortness of breath. 04/18/18  Yes [name], MD  aspirin EC 81 MG tablet Take 81 mg by mouth every morning.   Yes [provider]  Aspirin-Acetaminophen-Caffeine (EXCEDRIN PO) Take 2 tablets by mouth 2 (two) times daily as needed (migraine).   Yes [provider]  atenolol (TENORMIN) 25 MG tablet Take 25 mg by mouth every morning.    Yes [provider]  atorvastatin (LIPITOR) 80 MG tablet Take 40 mg by mouth at bedtime.   Yes [provider]  cyclobenzaprine (FLEXERIL) 10 MG tablet Take 0.5-1 tablets (5-10 mg total) by mouth 3 (three) times daily as needed for muscle spasms. 11/08/13  Yes Jovita Gamma, MD  diclofenac (VOLTAREN) 75 MG EC tablet Take 75 mg by mouth 2 (two) times daily.    Yes [provider]  DULoxetine (CYMBALTA) 60 MG capsule Take 1 capsule (60 mg total) by mouth daily. 11/13/17  Yes Wendie Agreste, MD   fluticasone-salmeterol (ADVAIR HFA) [phone] MCG/ACT inhaler Inhale 2 puffs into the lungs 2 (two) times daily. 04/18/18  Yes [name], MD  HYDROcodone-acetaminophen (NORCO/VICODIN) 5-325 MG tablet Take 1 tablet by mouth every 6 (six) hours as needed for moderate pain. 10/12/18  Yes Wendie Agreste, MD  Ketotifen Fumarate (THERA TEARS ALLERGY OP) Place 1 drop into both eyes daily as needed (dry eyes).   Yes [provider]  montelukast (SINGULAIR) 10 MG tablet Take 1 tablet (10 mg total) by mouth at bedtime. 04/03/17  Yes [name], MD  olopatadine (PATADAY) 0.1 % ophthalmic solution Place 1 drop into both eyes 2 (two) times daily. 10/15/18  Yes Yu, Amy V, PA-C  Omega-3 Fatty Acids (FISH OIL) 1000 MG CPDR Take  2,000 mg by mouth 2 (two) times daily.     Yes [provider]  omeprazole (PRILOSEC) 20 MG capsule Take 20 mg by mouth daily.    Yes [provider]  topiramate (TOPAMAX) 25 MG tablet Take 25 mg by mouth daily.    Yes [provider]   Social History   Socioeconomic History  . Marital status: Married    Spouse name: Not on file  . Number of children: Not on file  . Years of education: Not on file  . Highest education level: Not on file  Occupational History  . Occupation: retired  Scientific laboratory technician  . Financial resource strain: Not on file  . Food insecurity:    Worry: Not on file    Inability: Not on file  . Transportation needs:    Medical: Not on file    Non-medical: Not on file  Tobacco Use  . Smoking status: Former Smoker    Packs/day: 0.50    Years: 22.00    Pack years: 11.00    Types: Cigarettes    Last attempt to quit: 06/27/1988    Years since quitting: 30.3  . Smokeless tobacco: Never Used  Substance and Sexual Activity  . Alcohol use: Yes    Alcohol/week: 3.0 standard drinks    Types: 3 Standard drinks or equivalent per week    Comment: 4-5 drinks / day- cognac   . Drug use: No  . Sexual activity: Not on file  Lifestyle   . Physical activity:    Days per week: Not on file    Minutes per session: Not on file  . Stress: Not on file  Relationships  . Social connections:    Talks on phone: Not on file    Gets together: Not on file    Attends religious service: Not on file    Active member of club or organization: Not on file    Attends meetings of clubs or organizations: Not on file    Relationship status: Not on file  . Intimate partner violence:    Fear of current or ex partner: Not on file    Emotionally abused: Not on file    Physically abused: Not on file    Forced sexual activity: Not on file  Other Topics Concern  . Not on file  Social History Narrative  . Not on file    Review of Systems Per HPI.     Objective:   Physical Exam Vitals signs reviewed.  Constitutional:      General: He is not in acute distress.    Appearance: He is well-developed.  HENT:     Head: Normocephalic and atraumatic.  Eyes:     Conjunctiva/sclera:     Right eye: Right conjunctiva is injected.     Left eye: Left conjunctiva is injected.   Cardiovascular:     Rate and Rhythm: Normal rate.  Pulmonary:     Effort: Pulmonary effort is normal.  Musculoskeletal:       Arms:  Skin:      Neurological:     Mental Status: He is alert and oriented to person, place, and time.   Calf circumference 45 cm on left versus 43 cm on right measured 15 cm below patella.  However there is a 1-2+ effusion in the left knee.  Slight edema to the left ankle.  Results for orders placed or performed in visit on 10/16/18  Sedimentation Rate  Result Value Ref Range  Sed Rate 36 (H) 0 - 30 mm/hr  C-reactive protein  Result Value Ref Range   CRP 3 0 - 10 mg/L     Vitals:   10/16/18 1616  BP: 123/78  Pulse: 73  Resp: 16  Temp: 98.3 F (36.8 C)  TempSrc: Oral  SpO2: 97%  Weight: 231 lb (104.8 kg)       Assessment & Plan:    BONIFACE GOFFE is a 63 y.o. male Vasculitis of skin - Plan: Sedimentation Rate,  C-reactive protein, predniSONE (DELTASONE) 20 MG tablet Rash - Plan: predniSONE (DELTASONE) 20 MG tablet Poison ivy dermatitis - Plan: predniSONE (DELTASONE) 20 MG tablet  -Rash suspicious for possible vasculitis with non-blanchable lesions.  No other systemic symptoms at this time, no oral or genital lesions.  Rash on arm appears to be consistent with secondary contact dermatitis from poison ivy.   - mild elevated sed rate, normal CRP, reassurng CBC, CMP.   -Prednisone 40 mg daily x5 days, potential side effects and risks discussed, potential need for Derm or rheumatology follow-up discussed. Recheck in 6 days.   Left leg swelling - Plan: VAS [place] LOWER EXTREMITY VENOUS (DVT), predniSONE (DELTASONE) 20 MG tablet Acute pain of left knee - Plan: predniSONE (DELTASONE) 20 MG tablet  - ortho follow up planned for knee. Swelling may be from knee, but will arrange for ultrasound to r/o dvt.   Subconjunctival hemorrhage of right eye  - may be from rubbing of eye with allergies. Allergy sx's now improved. Continue eye drops.   Meds ordered this encounter  Medications  . predniSONE (DELTASONE) 20 MG tablet    Sig: Take 2 tablets (40 mg total) by mouth daily with breakfast.    Dispense:  10 tablet    Refill:  0   Patient Instructions   I will check some bloodwork for the leg rash, but start prednisone for now to see if that helps.  Depending on how that works, I may need to have you evaluated by rheumatology or dermatologist. Keep follow-up with orthopedics as planned.  I will try to schedule an ultrasound of the left leg, but unlikely blood clot causing the swelling.  That may be due to the swelling of the knee. See information below on poison ivy, but prednisone should help that as well. Redness of the right eye appears to be a subconjunctival hemorrhage that should improve with time.  Use of the eyedrops for allergies is fine.  Recheck with me in 6 days by telemedicine visit, sooner or to the  emergency room if worse.   Poison Ivy Dermatitis  Poison ivy dermatitis is inflammation of the skin that is caused by the allergens on the leaves of the poison ivy plant. The skin reaction often involves redness, swelling, blisters, and extreme itching. What are the causes? This condition is caused by a specific chemical (urushiol) found in the sap of the poison ivy plant. This chemical is sticky and can be easily spread to people, animals, and objects. You can get poison ivy dermatitis by:  Having direct contact with a poison ivy plant.  Touching animals, other people, or objects that have come in contact with poison ivy and have the chemical on them. What increases the risk? This condition is more likely to develop in:  People who are outdoors often.  People who go outdoors without wearing protective clothing, such as closed shoes, long pants, and a long-sleeved shirt. What are the signs or symptoms? Symptoms of this condition  include:  Redness and itching.  A rash that often includes bumps and blisters. The rash usually appears 48 hours after exposure.  Swelling. This may occur if the reaction is more severe. Symptoms usually last for 1-2 weeks. However, the first time you develop this condition, symptoms may last 3-4 weeks. How is this diagnosed? This condition may be diagnosed based on your symptoms and a physical exam. Your health care provider may also ask you about any recent outdoor activity. How is this treated? Treatment for this condition will vary depending on how severe it is. Treatment may include:  Hydrocortisone creams or calamine lotions to relieve itching.  Oatmeal baths to soothe the skin.  Over-the-counter antihistamine tablets.  Oral steroid medicine for more severe outbreaks. Follow these instructions at home:  Take or apply over-the-counter and prescription medicines only as told by your health care provider.  Wash exposed skin as soon as possible  with soap and cold water.  Use hydrocortisone creams or calamine lotion as needed to soothe the skin and relieve itching.  Take oatmeal baths as needed. Use colloidal oatmeal. You can get this at your local pharmacy or grocery store. Follow the instructions on the packaging.  Do not scratch or rub your skin.  While you have the rash, wash clothes right after you wear them. How is this prevented?   Learn to identify the poison ivy plant and avoid contact with the plant. This plant can be recognized by the number of leaves. Generally, poison ivy has three leaves with flowering branches on a single stem. The leaves are typically glossy, and they have jagged edges that come to a point at the front.  If you have been exposed to poison ivy, thoroughly wash with soap and water right away. You have about 30 minutes to remove the plant resin before it will cause the rash. Be sure to wash under your fingernails because any plant resin there will continue to spread the rash.  When hiking or camping, wear clothes that will help you to avoid exposure on the skin. This includes long pants, a long-sleeved shirt, tall socks, and hiking boots. You can also apply preventive lotion to your skin to help limit exposure.  If you suspect that your clothes or outdoor gear came in contact with poison ivy, rinse them off outside with a garden hose before you bring them inside your house. Contact a health care provider if:  You have open sores in the rash area.  You have more redness, swelling, or pain in the affected area.  You have redness that spreads beyond the rash area.  You have fluid, blood, or pus coming from the affected area.  You have a fever.  You have a rash over a large area of your body.  You have a rash on your eyes, mouth, or genitals.  Your rash does not improve after a few days. Get help right away if:  Your face swells or your eyes swell shut.  You have trouble breathing.  You  have trouble swallowing. This information is not intended to replace advice given to you by your health care provider. Make sure you discuss any questions you have with your health care provider. Document Released: 06/10/2000 Document Revised: 11/24/2016 Document Reviewed: 11/19/2014 Elsevier Interactive Patient Education  2019 [employer] Inc.  Rash, Adult A rash is a change in the color of your skin. A rash can also change the way your skin feels. There are many different conditions and factors  that can cause a rash. Some rashes may disappear after a few days, but some may last for a few weeks. Common causes of rashes include:  Viral infections, such as: ? Colds. ? Measles. ? Hand, foot, and mouth disease.  Bacterial infections, such as: ? Scarlet fever. ? Impetigo.  Fungal infections, such as Candida.  Allergic reactions to food, medicines, or skin care products. Follow these instructions at home: The goal of treatment is to stop the itching and keep the rash from spreading. Pay attention to any changes in your symptoms. Follow these instructions to help with your condition: Medicine Take or apply over-the-counter and prescription medicines only as told by your health care provider. These may include:  Corticosteroid creams to treat red or swollen skin.  Anti-itch lotions.  Oral allergy medicines (antihistamines).  Oral corticosteroids for severe symptoms.  Skin care  Apply cool compresses to the affected areas.  Do not scratch or rub your skin.  Avoid covering the rash. Make sure the rash is exposed to air as much as possible. Managing itching and discomfort  Avoid hot showers or baths, which can make itching worse. A cold shower may help.  Try taking a bath with: ? Epsom salts. Follow manufacturer instructions on the packaging. You can get these at your local pharmacy or grocery store. ? Baking soda. Pour a small amount into the bath as told by your health care  provider. ? Colloidal oatmeal. Follow manufacturer instructions on the packaging. You can get this at your local pharmacy or grocery store.  Try applying baking soda paste to your skin. Stir water into baking soda until it reaches a paste-like consistency.  Try applying calamine lotion. This is an over-the-counter lotion that helps to relieve itchiness.  Keep cool and out of the sun. Sweating and being hot can make itching worse. General instructions   Rest as needed.  Drink enough fluid to keep your urine pale yellow.  Wear loose-fitting clothing.  Avoid scented soaps, detergents, and perfumes. Use gentle soaps, detergents, perfumes, and other cosmetic products.  Avoid any substance that causes your rash. Keep a journal to help track what causes your rash. Write down: ? What you eat. ? What cosmetic products you use. ? What you drink. ? What you wear. This includes jewelry.  Keep all follow-up visits as told by your health care provider. This is important. Contact a health care provider if:  You sweat at night.  You lose weight.  You urinate more than normal.  You urinate less than normal, or you notice that your urine is a darker color than usual.  You feel weak.  You vomit.  Your skin or the whites of your eyes look yellow (jaundice).  Your skin: ? Tingles. ? Is numb.  Your rash: ? Does not go away after several days. ? Gets worse.  You are: ? Unusually thirsty. ? More tired than normal.  You have: ? New symptoms. ? Pain in your abdomen. ? A fever. ? Diarrhea. Get help right away if you:  Have a fever and your symptoms suddenly get worse.  Develop confusion.  Have a severe headache or a stiff neck.  Have severe joint pains or stiffness.  Have a seizure.  Develop a rash that covers all or most of your body. The rash may or may not be painful.  Develop blisters that: ? Are on top of the rash. ? Grow larger or grow together. ? Are painful.  ? Are inside your  nose or mouth.  Develop a rash that: ? Looks like purple pinprick-sized spots all over your body. ? Has a "bull's eye" or looks like a target. ? Is not related to sun exposure, is red and painful, and causes your skin to peel. Summary  A rash is a change in the color of your skin. Some rashes disappear after a few days, but some may last for a few weeks.  The goal of treatment is to stop the itching and keep the rash from spreading.  Take or apply over-the-counter and prescription medicines only as told by your health care provider.  Contact a health care provider if you have new or worsening symptoms.  Keep all follow-up visits as told by your health care provider. This is important. This information is not intended to replace advice given to you by your health care provider. Make sure you discuss any questions you have with your health care provider. Document Released: 06/03/2002 Document Revised: 01/15/2018 Document Reviewed: 01/15/2018 Elsevier Interactive Patient Education  [employer].    If you have lab work done today you will be contacted with your lab results within the next 2 weeks.  If you have not heard from [place] then please contact us. The fastest way to get your results is to register for My Chart.   IF you received an x-ray today, you will receive an invoice from [facility]. Please contact [facility] at [phone] with questions or concerns regarding your invoice.   IF you received labwork today, you will receive an invoice from [facility]. Please contact [facility] at [phone] with questions or concerns regarding your invoice.   Our billing staff will not be able to assist you with questions regarding bills from these companies.  You will be contacted with the lab results as soon as they are available. The fastest way to get your results is to activate your My Chart account. Instructions are located on the last page of  this paperwork. If you have not heard from [place] regarding the results in 2 weeks, please contact this office.       Signed,   Merri Ray, MD [facility].  10/19/18 11:19 AM

## 2018-10-16 NOTE — Progress Notes (Signed)
CC- 1# swelling in left knee and ankle- Pain, stiffness, and swelling since . Rash on left knee and leg as well In the same area since 10/13/18 just showed up.        2# poison ivy on right arm

## 2018-10-16 NOTE — Patient Instructions (Signed)
° ° ° °  If you have lab work done today you will be contacted with your lab results within the next 2 weeks.  If you have not heard from us then please contact us. The fastest way to get your results is to register for My Chart. ° ° °IF you received an x-ray today, you will receive an invoice from [facility]. Please contact  Radiology at [phone] with questions or concerns regarding your invoice.  ° °IF you received labwork today, you will receive an invoice from [facility]. Please contact [facility] at [phone] with questions or concerns regarding your invoice.  ° °Our billing staff will not be able to assist you with questions regarding bills from these companies. ° °You will be contacted with the lab results as soon as they are available. The fastest way to get your results is to activate your My Chart account. Instructions are located on the last page of this paperwork. If you have not heard from us regarding the results in 2 weeks, please contact this office. °  ° ° ° °

## 2018-10-16 NOTE — Patient Instructions (Addendum)
I will check some bloodwork for the leg rash, but start prednisone for now to see if that helps.  Depending on how that works, I may need to have you evaluated by rheumatology or dermatologist. Keep follow-up with orthopedics as planned.  I will try to schedule an ultrasound of the left leg, but unlikely blood clot causing the swelling.  That may be due to the swelling of the knee. See information below on poison ivy, but prednisone should help that as well. Redness of the right eye appears to be a subconjunctival hemorrhage that should improve with time.  Use of the eyedrops for allergies is fine.  Recheck with me in 6 days by telemedicine visit, sooner or to the emergency room if worse.   Poison Ivy Dermatitis  Poison ivy dermatitis is inflammation of the skin that is caused by the allergens on the leaves of the poison ivy plant. The skin reaction often involves redness, swelling, blisters, and extreme itching. What are the causes? This condition is caused by a specific chemical (urushiol) found in the sap of the poison ivy plant. This chemical is sticky and can be easily spread to people, animals, and objects. You can get poison ivy dermatitis by:  Having direct contact with a poison ivy plant.  Touching animals, other people, or objects that have come in contact with poison ivy and have the chemical on them. What increases the risk? This condition is more likely to develop in:  People who are outdoors often.  People who go outdoors without wearing protective clothing, such as closed shoes, long pants, and a long-sleeved shirt. What are the signs or symptoms? Symptoms of this condition include:  Redness and itching.  A rash that often includes bumps and blisters. The rash usually appears 48 hours after exposure.  Swelling. This may occur if the reaction is more severe. Symptoms usually last for 1-2 weeks. However, the first time you develop this condition, symptoms may last 3-4  weeks. How is this diagnosed? This condition may be diagnosed based on your symptoms and a physical exam. Your health care provider may also ask you about any recent outdoor activity. How is this treated? Treatment for this condition will vary depending on how severe it is. Treatment may include:  Hydrocortisone creams or calamine lotions to relieve itching.  Oatmeal baths to soothe the skin.  Over-the-counter antihistamine tablets.  Oral steroid medicine for more severe outbreaks. Follow these instructions at home:  Take or apply over-the-counter and prescription medicines only as told by your health care provider.  Wash exposed skin as soon as possible with soap and cold water.  Use hydrocortisone creams or calamine lotion as needed to soothe the skin and relieve itching.  Take oatmeal baths as needed. Use colloidal oatmeal. You can get this at your local pharmacy or grocery store. Follow the instructions on the packaging.  Do not scratch or rub your skin.  While you have the rash, wash clothes right after you wear them. How is this prevented?   Learn to identify the poison ivy plant and avoid contact with the plant. This plant can be recognized by the number of leaves. Generally, poison ivy has three leaves with flowering branches on a single stem. The leaves are typically glossy, and they have jagged edges that come to a point at the front.  If you have been exposed to poison ivy, thoroughly wash with soap and water right away. You have about 30 minutes to remove the plant  resin before it will cause the rash. Be sure to wash under your fingernails because any plant resin there will continue to spread the rash.  When hiking or camping, wear clothes that will help you to avoid exposure on the skin. This includes long pants, a long-sleeved shirt, tall socks, and hiking boots. You can also apply preventive lotion to your skin to help limit exposure.  If you suspect that your  clothes or outdoor gear came in contact with poison ivy, rinse them off outside with a garden hose before you bring them inside your house. Contact a health care provider if:  You have open sores in the rash area.  You have more redness, swelling, or pain in the affected area.  You have redness that spreads beyond the rash area.  You have fluid, blood, or pus coming from the affected area.  You have a fever.  You have a rash over a large area of your body.  You have a rash on your eyes, mouth, or genitals.  Your rash does not improve after a few days. Get help right away if:  Your face swells or your eyes swell shut.  You have trouble breathing.  You have trouble swallowing. This information is not intended to replace advice given to you by your health care provider. Make sure you discuss any questions you have with your health care provider. Document Released: 06/10/2000 Document Revised: 11/24/2016 Document Reviewed: 11/19/2014 Elsevier Interactive Patient Education  2019 [employer]  Rash, Adult A rash is a change in the color of your skin. A rash can also change the way your skin feels. There are many different conditions and factors that can cause a rash. Some rashes may disappear after a few days, but some may last for a few weeks. Common causes of rashes include:  Viral infections, such as: ? Colds. ? Measles. ? Hand, foot, and mouth disease.  Bacterial infections, such as: ? Scarlet fever. ? Impetigo.  Fungal infections, such as Candida.  Allergic reactions to food, medicines, or skin care products. Follow these instructions at home: The goal of treatment is to stop the itching and keep the rash from spreading. Pay attention to any changes in your symptoms. Follow these instructions to help with your condition: Medicine Take or apply over-the-counter and prescription medicines only as told by your health care provider. These may include:  Corticosteroid  creams to treat red or swollen skin.  Anti-itch lotions.  Oral allergy medicines (antihistamines).  Oral corticosteroids for severe symptoms.  Skin care  Apply cool compresses to the affected areas.  Do not scratch or rub your skin.  Avoid covering the rash. Make sure the rash is exposed to air as much as possible. Managing itching and discomfort  Avoid hot showers or baths, which can make itching worse. A cold shower may help.  Try taking a bath with: ? Epsom salts. Follow manufacturer instructions on the packaging. You can get these at your local pharmacy or grocery store. ? Baking soda. Pour a small amount into the bath as told by your health care provider. ? Colloidal oatmeal. Follow manufacturer instructions on the packaging. You can get this at your local pharmacy or grocery store.  Try applying baking soda paste to your skin. Stir water into baking soda until it reaches a paste-like consistency.  Try applying calamine lotion. This is an over-the-counter lotion that helps to relieve itchiness.  Keep cool and out of the sun. Sweating and being hot can  make itching worse. General instructions   Rest as needed.  Drink enough fluid to keep your urine pale yellow.  Wear loose-fitting clothing.  Avoid scented soaps, detergents, and perfumes. Use gentle soaps, detergents, perfumes, and other cosmetic products.  Avoid any substance that causes your rash. Keep a journal to help track what causes your rash. Write down: ? What you eat. ? What cosmetic products you use. ? What you drink. ? What you wear. This includes jewelry.  Keep all follow-up visits as told by your health care provider. This is important. Contact a health care provider if:  You sweat at night.  You lose weight.  You urinate more than normal.  You urinate less than normal, or you notice that your urine is a darker color than usual.  You feel weak.  You vomit.  Your skin or the whites of your  eyes look yellow (jaundice).  Your skin: ? Tingles. ? Is numb.  Your rash: ? Does not go away after several days. ? Gets worse.  You are: ? Unusually thirsty. ? More tired than normal.  You have: ? New symptoms. ? Pain in your abdomen. ? A fever. ? Diarrhea. Get help right away if you:  Have a fever and your symptoms suddenly get worse.  Develop confusion.  Have a severe headache or a stiff neck.  Have severe joint pains or stiffness.  Have a seizure.  Develop a rash that covers all or most of your body. The rash may or may not be painful.  Develop blisters that: ? Are on top of the rash. ? Grow larger or grow together. ? Are painful. ? Are inside your nose or mouth.  Develop a rash that: ? Looks like purple pinprick-sized spots all over your body. ? Has a "bull's eye" or looks like a target. ? Is not related to sun exposure, is red and painful, and causes your skin to peel. Summary  A rash is a change in the color of your skin. Some rashes disappear after a few days, but some may last for a few weeks.  The goal of treatment is to stop the itching and keep the rash from spreading.  Take or apply over-the-counter and prescription medicines only as told by your health care provider.  Contact a health care provider if you have new or worsening symptoms.  Keep all follow-up visits as told by your health care provider. This is important. This information is not intended to replace advice given to you by your health care provider. Make sure you discuss any questions you have with your health care provider. Document Released: 06/03/2002 Document Revised: 01/15/2018 Document Reviewed: 01/15/2018 Elsevier Interactive Patient Education  [employer].    If you have lab work done today you will be contacted with your lab results within the next 2 weeks.  If you have not heard from [place] then please contact us. The fastest way to get your results is to register for  My Chart.   IF you received an x-ray today, you will receive an invoice from [facility]. Please contact [facility] at [phone] with questions or concerns regarding your invoice.   IF you received labwork today, you will receive an invoice from [place]. Please contact [facility] at [phone] with questions or concerns regarding your invoice.   Our billing staff will not be able to assist you with questions regarding bills from these companies.  You will be contacted with the lab results as soon  as they are available. The fastest way to get your results is to activate your My Chart account. Instructions are located on the last page of this paperwork. If you have not heard from [place] regarding the results in 2 weeks, please contact this office.

## 2018-10-17 LAB — SEDIMENTATION RATE: Sed Rate: 36 mm/hr — ABNORMAL HIGH (ref 0–30)

## 2018-10-17 LAB — C-REACTIVE PROTEIN: CRP: 3 mg/L (ref 0–10)

## 2018-10-19 ENCOUNTER — Ambulatory Visit (INDEPENDENT_AMBULATORY_CARE_PROVIDER_SITE_OTHER): Payer: Medicare PPO | Admitting: Orthopaedic Surgery

## 2018-10-19 ENCOUNTER — Other Ambulatory Visit: Payer: Self-pay

## 2018-10-19 DIAGNOSIS — M25562 Pain in left knee: Secondary | ICD-10-CM

## 2018-10-19 DIAGNOSIS — G8929 Other chronic pain: Secondary | ICD-10-CM

## 2018-10-19 MED ORDER — BUPIVACAINE HCL 0.5 % IJ SOLN
2.0000 mL | INTRAMUSCULAR | Status: AC | PRN
  Administered 2018-10-19: 2 mL via INTRA_ARTICULAR

## 2018-10-19 MED ORDER — METHYLPREDNISOLONE ACETATE 40 MG/ML IJ SUSP
40.0000 mg | INTRAMUSCULAR | Status: AC | PRN
  Administered 2018-10-19: 40 mg via INTRA_ARTICULAR

## 2018-10-19 MED ORDER — LIDOCAINE HCL 1 % IJ SOLN
2.0000 mL | INTRAMUSCULAR | Status: AC | PRN
  Administered 2018-10-19: 2 mL

## 2018-10-19 NOTE — Addendum Note (Signed)
Addended by: Jacklyn Shell on: 10/19/2018 11:10 AM   Modules accepted: Orders

## 2018-10-19 NOTE — Progress Notes (Signed)
Office Visit Note   Patient: Douglas Carpenter           Date of Birth: [birth year]           MRN: [id number] Visit Date: 10/19/2018              Requested by: Wendie Agreste, MD [address] [place], [place] [address] PCP: Wendie Agreste, MD   Assessment & Plan: Visit Diagnoses:  1. Chronic pain of left knee     Plan: Impression is left knee pain and joint effusion.  30 cc of clear joint fluid was aspirated from the knee joint and cortisone was then placed.  Patient tolerated well.  We will analyze the fluid and notify the patient of the results.  He may increase activity as tolerated.  Follow-up as needed. Total face to face encounter time was greater than 45 minutes and over half of this time was spent in counseling and/or coordination of care.  Follow-Up Instructions: Return if symptoms worsen or fail to improve.   Orders:  No orders of the defined types were placed in this encounter.  No orders of the defined types were placed in this encounter.     Procedures: Large Joint Inj: L knee on 10/19/2018 10:39 AM Details: 22 G needle Medications: 2 mL bupivacaine 0.5 %; 2 mL lidocaine 1 %; 40 mg methylPREDNISolone acetate 40 MG/ML Aspirate: 35 mL clear; sent for lab analysis Outcome: tolerated well, no immediate complications Patient was prepped and draped in the usual sterile fashion.       Clinical Data: No additional findings.   Subjective: Chief Complaint  Patient presents with  . Left Knee - Pain    Douglas Carpenter is a very pleasant 63 year old gentleman who comes in with 2 months of left knee pain of insidious onset.  He takes chronic pain medicines for chronic back pain.  He has tried meloxicam for his knee pain which does not significantly improve his symptoms.  He does endorse swelling also.  The pain is worse with increased standing walking and he describes the pain as a achy constant throbbing pain.  Denies a history of gout or pseudogout.  He obtained x-rays  earlier this month at the urgent care.  Denies any mechanical symptoms.  Denies any numbness and tingling.   Review of Systems  Constitutional: Negative.   All other systems reviewed and are negative.    Objective: Vital Signs: There were no vitals taken for this visit.  Physical Exam Vitals signs and nursing note reviewed.  Constitutional:      Appearance: He is well-developed.  HENT:     Head: Normocephalic and atraumatic.  Eyes:     Pupils: Pupils are equal, round, and reactive to light.  Neck:     Musculoskeletal: Neck supple.  Pulmonary:     Effort: Pulmonary effort is normal.  Abdominal:     Palpations: Abdomen is soft.  Musculoskeletal: Normal range of motion.  Skin:    General: Skin is warm.  Neurological:     Mental Status: He is alert and oriented to person, place, and time.  Psychiatric:        Behavior: Behavior normal.        Thought Content: Thought content normal.        Judgment: Judgment normal.     Ortho Exam Left knee exam shows a moderate joint effusion.  No signs of infection.  Collaterals and cruciates are stable.  1+ patellofemoral crepitus. Specialty Comments:  No specialty comments available.  Imaging: No results found.   PMFS History: Patient Active Problem List   Diagnosis Date Noted  . Exercise-induced asthma 07/21/2014  . Chronic cough 07/21/2014  . Upper airway cough syndrome 07/21/2014  . Allergic rhinitis 07/21/2014  . Lumbar stenosis 11/06/2013  . Urethral stricture 01/07/2013  . Prostatic hypertrophy 11/12/2012  . VOCAL CORD DISORDER 02/19/2009  . HYPERLIPIDEMIA 01/27/2009  . Snoring 01/27/2009  . HYPERTENSION 01/27/2009  . CHRONIC RHINITIS 01/27/2009  . Asthma 01/27/2009  . GERD 01/27/2009   Past Medical History:  Diagnosis Date  . Allergy   . Arthritis    "everywhere"  . BPH (benign prostatic hypertrophy)   . Chronic rhinitis 01/27/2009  . ED (erectile dysfunction)   . GERD (gastroesophageal reflux disease)    . Headache(784.0)    migraines, as many as 3 in a week    . HYPERLIPIDEMIA 01/27/2009  . HYPERTENSION 01/27/2009  . Mild asthma    seasonal allergies, uses Proair once per yr.   . Mild obstructive sleep apnea    per pt -- mild osa test result--  no rx cpap needed  . Neuromuscular disorder ([place])   . Sleep apnea    no cpap  . Testosterone deficiency   . Urethral stricture   . Vocal cord anomaly    pt states told from [place]  doctor heard a little gurgle sound -- no farther testing done    Family History  Problem Relation Age of Onset  . Cancer Mother        lung  . Lung cancer Mother   . Heart disease Maternal Grandmother   . Heart disease Maternal Grandfather   . Cancer Maternal Grandfather        colon, prostate,lung  . Colon cancer Neg Hx   . Colon polyps Neg Hx   . Esophageal cancer Neg Hx   . Rectal cancer Neg Hx   . Stomach cancer Neg Hx     Past Surgical History:  Procedure Laterality Date  . COLONOSCOPY    . CYSTOSCOPY WITH URETHRAL DILATATION N/A 01/07/2013   Procedure: CYSTOSCOPY WITH URETHRAL DILATATION BALLOON DILATION ;  Surgeon: Bernestine Amass, MD;  Location[facility];  Service: Urology;  Laterality: N/A;  . LUMBAR LAMINECTOMY/DECOMPRESSION MICRODISCECTOMY Right 11/29/2012   Procedure: Right Lumbar Four to Five Laminectomy/ Decompression Microdiskectomy;  Surgeon: Otilio Connors, MD;  Location: [facility];  Service: Neurosurgery;  Laterality: Right;  LUMBAR LAMINECTOMY/DECOMPRESSION MICRODISCECTOMY 1 LEVEL  . POLYPECTOMY    . SHOULDER ARTHROSCOPY W/ ACROMIAL REPAIR Left 08-28-2002  . UPPER GASTROINTESTINAL ENDOSCOPY     Social History   Occupational History  . Occupation: retired  Tobacco Use  . Smoking status: Former Smoker    Packs/day: 0.50    Years: 22.00    Pack years: 11.00    Types: Cigarettes    Last attempt to quit: 06/27/1988    Years since quitting: 30.3  . Smokeless tobacco: Never Used  Substance and Sexual Activity  . Alcohol  use: Yes    Alcohol/week: 3.0 standard drinks    Types: 3 Standard drinks or equivalent per week    Comment: 4-5 drinks / day- cognac   . Drug use: No  . Sexual activity: Not on file

## 2018-10-20 LAB — TIQ-NTM

## 2018-10-20 LAB — SYNOVIAL CELL COUNT + DIFF, W/ CRYSTALS
Basophils, %: 0 %
Eosinophils-Synovial: 0 % (ref 0–2)
Lymphocytes-Synovial Fld: 43 % (ref 0–74)
Monocyte/Macrophage: 55 % (ref 0–69)
Neutrophil, Synovial: 2 % (ref 0–24)
Synoviocytes, %: 0 % (ref 0–15)
WBC, Synovial: 132 cells/uL (ref <150)

## 2018-10-20 NOTE — Progress Notes (Signed)
Please let him know it's pseudogout like we had talked about.  The cortisone injection should treat this.  He doesn't need to do anything at this time other than take it easy.

## 2018-10-22 ENCOUNTER — Telehealth (INDEPENDENT_AMBULATORY_CARE_PROVIDER_SITE_OTHER): Payer: Self-pay | Admitting: Orthopaedic Surgery

## 2018-10-22 NOTE — Telephone Encounter (Signed)
Pt wife called in said liz gave them a call this morning in regards to his test results and she said they were told the pt has gout and she would like for liz to give her a call back to discuss some questions about special diet, said she needs to know what to do so she can provide the correct care for her husband.  [phone]

## 2018-10-23 NOTE — Telephone Encounter (Signed)
called the pt and lm on vm to advise of message below. The calcium crystals in the joint fluid that was sent off confirmed pseudogout and the injection that he had should be helpful in the treatment of the inflammation. To call with any questions or concerns.

## 2018-10-23 NOTE — Telephone Encounter (Signed)
See message.

## 2018-10-23 NOTE — Telephone Encounter (Signed)
It's pseudogout, not gout.  No dietary modifications need to be made.

## 2018-10-26 ENCOUNTER — Telehealth (INDEPENDENT_AMBULATORY_CARE_PROVIDER_SITE_OTHER): Payer: Medicare PPO | Admitting: Family Medicine

## 2018-10-26 ENCOUNTER — Other Ambulatory Visit: Payer: Self-pay

## 2018-10-26 DIAGNOSIS — R21 Rash and other nonspecific skin eruption: Secondary | ICD-10-CM | POA: Diagnosis not present

## 2018-10-26 DIAGNOSIS — M11262 Other chondrocalcinosis, left knee: Secondary | ICD-10-CM

## 2018-10-26 NOTE — Patient Instructions (Signed)
° ° ° °  If you have lab work done today you will be contacted with your lab results within the next 2 weeks.  If you have not heard from us then please contact us. The fastest way to get your results is to register for My Chart. ° ° °IF you received an x-ray today, you will receive an invoice from [facility]. Please contact [facility] at [phone] with questions or concerns regarding your invoice.  ° °IF you received labwork today, you will receive an invoice from [facility]. Please contact [facility] at [phone] with questions or concerns regarding your invoice.  ° °Our billing staff will not be able to assist you with questions regarding bills from these companies. ° °You will be contacted with the lab results as soon as they are available. The fastest way to get your results is to activate your My Chart account. Instructions are located on the last page of this paperwork. If you have not heard from us regarding the results in 2 weeks, please contact this office. °  ° ° ° °

## 2018-10-26 NOTE — Progress Notes (Addendum)
Virtual Visit via Telephone Note  I connected with Douglas Carpenter on 10/26/18 at 4:27 PM by telephone and verified that I am speaking with the correct person using two identifiers.   I discussed the limitations, risks, security and privacy concerns of performing an evaluation and management service by telephone and the availability of in person appointments. I also discussed with the patient that there may be a patient responsible charge related to this service. The patient expressed understanding and agreed to proceed, consent obtained  Chief complaint: Rash, knee pain.   Chart reviewed with specialist note and labs.   History of Present Illness: Douglas Carpenter is a 63 y.o. male  Rash: See prior visit. Treated with prednisone last visit. Improved as soon as starting prednisone.  Rash is gone, and poison ivy has resolved as well.   L knee pain: Seen by Dr. Erlinda Hong 4/24 had aspiration, cortisone injection. Aspirate indicated pseudogout.  Still sore, still some swelling in that area. No chest pain, no dyspnea.    Patient Active Problem List   Diagnosis Date Noted  . Exercise-induced asthma 07/21/2014  . Chronic cough 07/21/2014  . Upper airway cough syndrome 07/21/2014  . Allergic rhinitis 07/21/2014  . Lumbar stenosis 11/06/2013  . Urethral stricture 01/07/2013  . Prostatic hypertrophy 11/12/2012  . VOCAL CORD DISORDER 02/19/2009  . HYPERLIPIDEMIA 01/27/2009  . Snoring 01/27/2009  . HYPERTENSION 01/27/2009  . CHRONIC RHINITIS 01/27/2009  . Asthma 01/27/2009  . GERD 01/27/2009   Past Medical History:  Diagnosis Date  . Allergy   . Arthritis    "everywhere"  . BPH (benign prostatic hypertrophy)   . Chronic rhinitis 01/27/2009  . ED (erectile dysfunction)   . GERD (gastroesophageal reflux disease)   . Headache(784.0)    migraines, as many as 3 in a week    . HYPERLIPIDEMIA 01/27/2009  . HYPERTENSION 01/27/2009  . Mild asthma    seasonal allergies, uses Proair once per yr.   .  Mild obstructive sleep apnea    per pt -- mild osa test result--  no rx cpap needed  . Neuromuscular disorder ([place])   . Sleep apnea    no cpap  . Testosterone deficiency   . Urethral stricture   . Vocal cord anomaly    pt states told from [place]  doctor heard a little gurgle sound -- no farther testing done   Past Surgical History:  Procedure Laterality Date  . COLONOSCOPY    . CYSTOSCOPY WITH URETHRAL DILATATION N/A 01/07/2013   Procedure: CYSTOSCOPY WITH URETHRAL DILATATION BALLOON DILATION ;  Surgeon: Bernestine Amass, MD;  [facility];  Service: Urology;  Laterality: N/A;  . LUMBAR LAMINECTOMY/DECOMPRESSION MICRODISCECTOMY Right 11/29/2012   Procedure: Right Lumbar Four to Five Laminectomy/ Decompression Microdiskectomy;  Surgeon: Otilio Connors, MD;  Location: [place] [facility];  Service: Neurosurgery;  Laterality: Right;  LUMBAR LAMINECTOMY/DECOMPRESSION MICRODISCECTOMY 1 LEVEL  . POLYPECTOMY    . SHOULDER ARTHROSCOPY W/ ACROMIAL REPAIR Left 08-28-2002  . UPPER GASTROINTESTINAL ENDOSCOPY     Allergies  Allergen Reactions  . Aspirin Rash    Pt can tolerate low doses-- INTOLERANT TO HIGHER DOSES   Prior to Admission medications   Medication Sig Start Date End Date Taking? Authorizing Provider  albuterol (PROAIR HFA) 108 (90 Base) MCG/ACT inhaler Inhale 2 puffs into the lungs every 6 (six) hours as needed for wheezing or shortness of breath. 04/18/18  Yes [name], MD  aspirin EC 81 MG tablet  Take 81 mg by mouth every morning.   Yes [provider]  Aspirin-Acetaminophen-Caffeine (EXCEDRIN PO) Take 2 tablets by mouth 2 (two) times daily as needed (migraine).   Yes [provider]  atenolol (TENORMIN) 25 MG tablet Take 25 mg by mouth every morning.    Yes [provider]  atorvastatin (LIPITOR) 80 MG tablet Take 40 mg by mouth at bedtime.   Yes [provider]  cyclobenzaprine (FLEXERIL) 10 MG tablet Take 0.5-1 tablets (5-10 mg  total) by mouth 3 (three) times daily as needed for muscle spasms. 11/08/13  Yes Jovita Gamma, MD  diclofenac (VOLTAREN) 75 MG EC tablet Take 75 mg by mouth 2 (two) times daily.    Yes [provider]  DULoxetine (CYMBALTA) 60 MG capsule Take 1 capsule (60 mg total) by mouth daily. 11/13/17  Yes Wendie Agreste, MD  fluticasone-salmeterol (ADVAIR HFA) [phone] MCG/ACT inhaler Inhale 2 puffs into the lungs 2 (two) times daily. 04/18/18  Yes [name], MD  HYDROcodone-acetaminophen (NORCO/VICODIN) 5-325 MG tablet Take 1 tablet by mouth every 6 (six) hours as needed for moderate pain. 10/12/18  Yes Wendie Agreste, MD  Ketotifen Fumarate (THERA TEARS ALLERGY OP) Place 1 drop into both eyes daily as needed (dry eyes).   Yes [provider]  montelukast (SINGULAIR) 10 MG tablet Take 1 tablet (10 mg total) by mouth at bedtime. 04/03/17  Yes [name], MD  olopatadine (PATADAY) 0.1 % ophthalmic solution Place 1 drop into both eyes 2 (two) times daily. 10/15/18  Yes Yu, Amy V, PA-C  Omega-3 Fatty Acids (FISH OIL) 1000 MG CPDR Take 2,000 mg by mouth 2 (two) times daily.     Yes [provider]  omeprazole (PRILOSEC) 20 MG capsule Take 20 mg by mouth daily.    Yes [provider]  predniSONE (DELTASONE) 20 MG tablet Take 2 tablets (40 mg total) by mouth daily with breakfast. 10/16/18  Yes Wendie Agreste, MD  topiramate (TOPAMAX) 25 MG tablet Take 25 mg by mouth daily.    Yes [provider]   Social History   Socioeconomic History  . Marital status: Married    Spouse name: Not on file  . Number of children: Not on file  . Years of education: Not on file  . Highest education level: Not on file  Occupational History  . Occupation: retired  Scientific laboratory technician  . Financial resource strain: Not on file  . Food insecurity:    Worry: Not on file    Inability: Not on file  . Transportation needs:    Medical: Not on file    Non-medical: Not on file  Tobacco  Use  . Smoking status: Former Smoker    Packs/day: 0.50    Years: 22.00    Pack years: 11.00    Types: Cigarettes    Last attempt to quit: 06/27/1988    Years since quitting: 30.3  . Smokeless tobacco: Never Used  Substance and Sexual Activity  . Alcohol use: Yes    Alcohol/week: 3.0 standard drinks    Types: 3 Standard drinks or equivalent per week    Comment: 4-5 drinks / day- cognac   . Drug use: No  . Sexual activity: Not on file  Lifestyle  . Physical activity:    Days per week: Not on file    Minutes per session: Not on file  . Stress: Not on file  Relationships  . Social connections:    Talks on phone:  Not on file    Gets together: Not on file    Attends religious service: Not on file    Active member of club or organization: Not on file    Attends meetings of clubs or organizations: Not on file    Relationship status: Not on file  . Intimate partner violence:    Fear of current or ex partner: Not on file    Emotionally abused: Not on file    Physically abused: Not on file    Forced sexual activity: Not on file  Other Topics Concern  . Not on file  Social History Narrative  . Not on file     Observations/Objective: No distress on phone.   Assessment and Plan: Pseudogout of left knee  -Status post aspiration and injection with corticosteroid by orthopedics.  Aspirate indicated pseudogout.  Still some discomfort.  Recommended following up with orthopedics by phone next week if not seeing any signs of improvement, RTC precautions if worsening.  Rash and nonspecific skin eruption  -Resolved with prednisone.  Thought to have had a component of poison ivy as well as other rash that appeared to be vasculitic, but with resolution will hold on further evaluation at this time.  If rash recurs would recommend  rheumatologic follow-up.  Follow Up Instructions:  As needed.    I discussed the assessment and treatment plan with the patient. The patient was provided an  opportunity to ask questions and all were answered. The patient agreed with the plan and demonstrated an understanding of the instructions.   The patient was advised to call back or seek an in-person evaluation if the symptoms worsen or if the condition fails to improve as anticipated.  I provided 5 minutes of non-face-to-face time during this encounter.  Signed,   Merri Ray, MD [facility] [place].  10/26/18

## 2018-10-26 NOTE — Progress Notes (Signed)
CC- f/u for rash- Rash is gone now it was on left knee to the ankle. Was seen by ortho Dr Erlinda Hong 1 wk ago today for the Pain in left knee. They dx me with senagout the left knee. They drew out some fluid and gave me a shot. Knee still hurt and swelling has gone down.

## 2018-11-01 DIAGNOSIS — M5116 Intervertebral disc disorders with radiculopathy, lumbar region: Secondary | ICD-10-CM | POA: Diagnosis not present

## 2018-11-01 DIAGNOSIS — M4726 Other spondylosis with radiculopathy, lumbar region: Secondary | ICD-10-CM | POA: Diagnosis not present

## 2018-11-01 DIAGNOSIS — M48061 Spinal stenosis, lumbar region without neurogenic claudication: Secondary | ICD-10-CM | POA: Diagnosis not present

## 2018-11-20 ENCOUNTER — Telehealth (INDEPENDENT_AMBULATORY_CARE_PROVIDER_SITE_OTHER): Payer: Medicare PPO | Admitting: Family Medicine

## 2018-11-20 ENCOUNTER — Other Ambulatory Visit: Payer: Self-pay

## 2018-11-20 DIAGNOSIS — L237 Allergic contact dermatitis due to plants, except food: Secondary | ICD-10-CM | POA: Diagnosis not present

## 2018-11-20 MED ORDER — TRIAMCINOLONE ACETONIDE 0.1 % EX CREA: 1.0000 "application " | TOPICAL_CREAM | Freq: Three times a day (TID) | CUTANEOUS | 0 refills | Status: DC

## 2018-11-20 NOTE — Progress Notes (Addendum)
Virtual Visit via Telephone Note  I connected with Douglas Carpenter on 11/20/18 at 5:44 PM by telephone and verified that I am speaking with the correct person using two identifiers.   I discussed the limitations, risks, security and privacy concerns of performing an evaluation and management service by telephone and the availability of in person appointments. I also discussed with the patient that there may be a patient responsible charge related to this service. The patient expressed understanding and agreed to proceed, consent obtained  Chief complaint:  Poison ivy.   History of Present Illness: Douglas Carpenter is a 63 y.o. male  Rash on R hand.  Top of hand by knuckes on middle fingers.  Same as poison ivy in past.small bumps/blisters.  1st started 3 days ago.  Has been working in yard.  Itching. No genital/face involvement.  No fever, feels well otherwise.  Tx: calamine lotion, tecnu.    Patient Active Problem List   Diagnosis Date Noted  . Exercise-induced asthma 07/21/2014  . Chronic cough 07/21/2014  . Upper airway cough syndrome 07/21/2014  . Allergic rhinitis 07/21/2014  . Lumbar stenosis 11/06/2013  . Urethral stricture 01/07/2013  . Prostatic hypertrophy 11/12/2012  . VOCAL CORD DISORDER 02/19/2009  . HYPERLIPIDEMIA 01/27/2009  . Snoring 01/27/2009  . HYPERTENSION 01/27/2009  . CHRONIC RHINITIS 01/27/2009  . Asthma 01/27/2009  . GERD 01/27/2009   Past Medical History:  Diagnosis Date  . Allergy   . Arthritis    "everywhere"  . BPH (benign prostatic hypertrophy)   . Chronic rhinitis 01/27/2009  . ED (erectile dysfunction)   . GERD (gastroesophageal reflux disease)   . Headache(784.0)    migraines, as many as 3 in a week    . HYPERLIPIDEMIA 01/27/2009  . HYPERTENSION 01/27/2009  . Mild asthma    seasonal allergies, uses Proair once per yr.   . Mild obstructive sleep apnea    per pt -- mild osa test result--  no rx cpap needed  . Neuromuscular disorder  ([place])   . Sleep apnea    no cpap  . Testosterone deficiency   . Urethral stricture   . Vocal cord anomaly    pt states told from [place]  doctor heard a little gurgle sound -- no farther testing done   Past Surgical History:  Procedure Laterality Date  . COLONOSCOPY    . CYSTOSCOPY WITH URETHRAL DILATATION N/A 01/07/2013   Procedure: CYSTOSCOPY WITH URETHRAL DILATATION BALLOON DILATION ;  Surgeon: Bernestine Amass, MD;  [facility];  Service: Urology;  Laterality: N/A;  . LUMBAR LAMINECTOMY/DECOMPRESSION MICRODISCECTOMY Right 11/29/2012   Procedure: Right Lumbar Four to Five Laminectomy/ Decompression Microdiskectomy;  Surgeon: Otilio Connors, MD;  Location: [place] [facility];  Service: Neurosurgery;  Laterality: Right;  LUMBAR LAMINECTOMY/DECOMPRESSION MICRODISCECTOMY 1 LEVEL  . POLYPECTOMY    . SHOULDER ARTHROSCOPY W/ ACROMIAL REPAIR Left 08-28-2002  . UPPER GASTROINTESTINAL ENDOSCOPY     Allergies  Allergen Reactions  . Aspirin Rash    Pt can tolerate low doses-- INTOLERANT TO HIGHER DOSES   Prior to Admission medications   Medication Sig Start Date End Date Taking? Authorizing Provider  albuterol (PROAIR HFA) 108 (90 Base) MCG/ACT inhaler Inhale 2 puffs into the lungs every 6 (six) hours as needed for wheezing or shortness of breath. 04/18/18   [name], MD  aspirin EC 81 MG tablet Take 81 mg by mouth every morning.    [provider]  Aspirin-Acetaminophen-Caffeine (EXCEDRIN PO)  Take 2 tablets by mouth 2 (two) times daily as needed (migraine).    [provider]  atenolol (TENORMIN) 25 MG tablet Take 25 mg by mouth every morning.     [provider]  atorvastatin (LIPITOR) 80 MG tablet Take 40 mg by mouth at bedtime.    [provider]  cyclobenzaprine (FLEXERIL) 10 MG tablet Take 0.5-1 tablets (5-10 mg total) by mouth 3 (three) times daily as needed for muscle spasms. 11/08/13   Jovita Gamma, MD  diclofenac (VOLTAREN) 75 MG EC  tablet Take 75 mg by mouth 2 (two) times daily.     [provider]  DULoxetine (CYMBALTA) 60 MG capsule Take 1 capsule (60 mg total) by mouth daily. 11/13/17   Wendie Agreste, MD  fluticasone-salmeterol (ADVAIR HFA) [phone] MCG/ACT inhaler Inhale 2 puffs into the lungs 2 (two) times daily. 04/18/18   [name], MD  HYDROcodone-acetaminophen (NORCO/VICODIN) 5-325 MG tablet Take 1 tablet by mouth every 6 (six) hours as needed for moderate pain. 10/12/18   Wendie Agreste, MD  Ketotifen Fumarate (THERA TEARS ALLERGY OP) Place 1 drop into both eyes daily as needed (dry eyes).    [provider]  montelukast (SINGULAIR) 10 MG tablet Take 1 tablet (10 mg total) by mouth at bedtime. 04/03/17   [name], MD  olopatadine (PATADAY) 0.1 % ophthalmic solution Place 1 drop into both eyes 2 (two) times daily. 10/15/18   Tasia Catchings, Amy V, PA-C  Omega-3 Fatty Acids (FISH OIL) 1000 MG CPDR Take 2,000 mg by mouth 2 (two) times daily.      [provider]  omeprazole (PRILOSEC) 20 MG capsule Take 20 mg by mouth daily.     [provider]  predniSONE (DELTASONE) 20 MG tablet Take 2 tablets (40 mg total) by mouth daily with breakfast. 10/16/18   Wendie Agreste, MD  topiramate (TOPAMAX) 25 MG tablet Take 25 mg by mouth daily.     [provider]   Social History   Socioeconomic History  . Marital status: Married    Spouse name: Not on file  . Number of children: Not on file  . Years of education: Not on file  . Highest education level: Not on file  Occupational History  . Occupation: retired  Scientific laboratory technician  . Financial resource strain: Not on file  . Food insecurity:    Worry: Not on file    Inability: Not on file  . Transportation needs:    Medical: Not on file    Non-medical: Not on file  Tobacco Use  . Smoking status: Former Smoker    Packs/day: 0.50    Years: 22.00    Pack years: 11.00    Types: Cigarettes    Last attempt to quit: 06/27/1988    Years  since quitting: 30.4  . Smokeless tobacco: Never Used  Substance and Sexual Activity  . Alcohol use: Yes    Alcohol/week: 3.0 standard drinks    Types: 3 Standard drinks or equivalent per week    Comment: 4-5 drinks / day- cognac   . Drug use: No  . Sexual activity: Not on file  Lifestyle  . Physical activity:    Days per week: Not on file    Minutes per session: Not on file  . Stress: Not on file  Relationships  . Social connections:    Talks on phone: Not on file    Gets together: Not on file    Attends religious service:  Not on file    Active member of club or organization: Not on file    Attends meetings of clubs or organizations: Not on file    Relationship status: Not on file  . Intimate partner violence:    Fear of current or ex partner: Not on file    Emotionally abused: Not on file    Physically abused: Not on file    Forced sexual activity: Not on file  Other Topics Concern  . Not on file  Social History Narrative  . Not on file     Observations/Objective: No distress on phone, no dyspnea, appropriate responses.  Assessment and Plan: Poison ivy dermatitis - Plan: triamcinolone cream (KENALOG) 0.1 %  -Description and onset similar to previous poison ivy/contact dermatitis.  Has localized flare will treat with topical triamcinolone, over-the-counter calamine or IVy dry, RTC precautions and watch for spread to other areas including face or genitals.  Understanding expressed  Follow Up Instructions:   Patient Instructions   Use steroid cream, but if area is is worsening - please be seen.   Poison Ivy Dermatitis  Poison ivy dermatitis is inflammation of the skin that is caused by the allergens on the leaves of the poison ivy plant. The skin reaction often involves redness, swelling, blisters, and extreme itching. What are the causes? This condition is caused by a specific chemical (urushiol) found in the sap of the poison ivy plant. This chemical is sticky and  can be easily spread to people, animals, and objects. You can get poison ivy dermatitis by:  Having direct contact with a poison ivy plant.  Touching animals, other people, or objects that have come in contact with poison ivy and have the chemical on them. What increases the risk? This condition is more likely to develop in:  People who are outdoors often.  People who go outdoors without wearing protective clothing, such as closed shoes, long pants, and a long-sleeved shirt. What are the signs or symptoms? Symptoms of this condition include:  Redness and itching.  A rash that often includes bumps and blisters. The rash usually appears 48 hours after exposure.  Swelling. This may occur if the reaction is more severe. Symptoms usually last for 1-2 weeks. However, the first time you develop this condition, symptoms may last 3-4 weeks. How is this diagnosed? This condition may be diagnosed based on your symptoms and a physical exam. Your health care provider may also ask you about any recent outdoor activity. How is this treated? Treatment for this condition will vary depending on how severe it is. Treatment may include:  Hydrocortisone creams or calamine lotions to relieve itching.  Oatmeal baths to soothe the skin.  Over-the-counter antihistamine tablets.  Oral steroid medicine for more severe outbreaks. Follow these instructions at home:  Take or apply over-the-counter and prescription medicines only as told by your health care provider.  Wash exposed skin as soon as possible with soap and cold water.  Use hydrocortisone creams or calamine lotion as needed to soothe the skin and relieve itching.  Take oatmeal baths as needed. Use colloidal oatmeal. You can get this at your local pharmacy or grocery store. Follow the instructions on the packaging.  Do not scratch or rub your skin.  While you have the rash, wash clothes right after you wear them. How is this prevented?    Learn to identify the poison ivy plant and avoid contact with the plant. This plant can be recognized by the number of  leaves. Generally, poison ivy has three leaves with flowering branches on a single stem. The leaves are typically glossy, and they have jagged edges that come to a point at the front.  If you have been exposed to poison ivy, thoroughly wash with soap and water right away. You have about 30 minutes to remove the plant resin before it will cause the rash. Be sure to wash under your fingernails because any plant resin there will continue to spread the rash.  When hiking or camping, wear clothes that will help you to avoid exposure on the skin. This includes long pants, a long-sleeved shirt, tall socks, and hiking boots. You can also apply preventive lotion to your skin to help limit exposure.  If you suspect that your clothes or outdoor gear came in contact with poison ivy, rinse them off outside with a garden hose before you bring them inside your house. Contact a health care provider if:  You have open sores in the rash area.  You have more redness, swelling, or pain in the affected area.  You have redness that spreads beyond the rash area.  You have fluid, blood, or pus coming from the affected area.  You have a fever.  You have a rash over a large area of your body.  You have a rash on your eyes, mouth, or genitals.  Your rash does not improve after a few days. Get help right away if:  Your face swells or your eyes swell shut.  You have trouble breathing.  You have trouble swallowing. This information is not intended to replace advice given to you by your health care provider. Make sure you discuss any questions you have with your health care provider. Document Released: 06/10/2000 Document Revised: 11/24/2016 Document Reviewed: 11/19/2014 Elsevier Interactive Patient Education  [employer].   If you have lab work done today you will be contacted with your  lab results within the next 2 weeks.  If you have not heard from [place] then please contact us. The fastest way to get your results is to register for My Chart.   IF you received an x-ray today, you will receive an invoice from [facility]. Please contact [facility] at [phone] with questions or concerns regarding your invoice.   IF you received labwork today, you will receive an invoice from [place]. Please contact [facility] at [phone] with questions or concerns regarding your invoice.   Our billing staff will not be able to assist you with questions regarding bills from these companies.  You will be contacted with the lab results as soon as they are available. The fastest way to get your results is to activate your My Chart account. Instructions are located on the last page of this paperwork. If you have not heard from [place] regarding the results in 2 weeks, please contact this office.        I discussed the assessment and treatment plan with the patient. The patient was provided an opportunity to ask questions and all were answered. The patient agreed with the plan and demonstrated an understanding of the instructions.   The patient was advised to call back or seek an in-person evaluation if the symptoms worsen or if the condition fails to improve as anticipated.  I provided 7 minutes of non-face-to-face time during this encounter.  Signed,   Merri Ray, MD [facility] [place].  11/20/18

## 2018-11-20 NOTE — Progress Notes (Signed)
CC-poison ivy- Patient stated he did not have IPhone to do a virtual visit or no MyChart to upload picture but stated he know for sure this is poison ivy he gets it every year. Patient was told you may be requested to come into the office so you can take a look at this rash on right hand.

## 2018-11-20 NOTE — Patient Instructions (Addendum)
Use steroid cream, but if area is is worsening - please be seen.   Poison Ivy Dermatitis  Poison ivy dermatitis is inflammation of the skin that is caused by the allergens on the leaves of the poison ivy plant. The skin reaction often involves redness, swelling, blisters, and extreme itching. What are the causes? This condition is caused by a specific chemical (urushiol) found in the sap of the poison ivy plant. This chemical is sticky and can be easily spread to people, animals, and objects. You can get poison ivy dermatitis by:  Having direct contact with a poison ivy plant.  Touching animals, other people, or objects that have come in contact with poison ivy and have the chemical on them. What increases the risk? This condition is more likely to develop in:  People who are outdoors often.  People who go outdoors without wearing protective clothing, such as closed shoes, long pants, and a long-sleeved shirt. What are the signs or symptoms? Symptoms of this condition include:  Redness and itching.  A rash that often includes bumps and blisters. The rash usually appears 48 hours after exposure.  Swelling. This may occur if the reaction is more severe. Symptoms usually last for 1-2 weeks. However, the first time you develop this condition, symptoms may last 3-4 weeks. How is this diagnosed? This condition may be diagnosed based on your symptoms and a physical exam. Your health care provider may also ask you about any recent outdoor activity. How is this treated? Treatment for this condition will vary depending on how severe it is. Treatment may include:  Hydrocortisone creams or calamine lotions to relieve itching.  Oatmeal baths to soothe the skin.  Over-the-counter antihistamine tablets.  Oral steroid medicine for more severe outbreaks. Follow these instructions at home:  Take or apply over-the-counter and prescription medicines only as told by your health care  provider.  Wash exposed skin as soon as possible with soap and cold water.  Use hydrocortisone creams or calamine lotion as needed to soothe the skin and relieve itching.  Take oatmeal baths as needed. Use colloidal oatmeal. You can get this at your local pharmacy or grocery store. Follow the instructions on the packaging.  Do not scratch or rub your skin.  While you have the rash, wash clothes right after you wear them. How is this prevented?   Learn to identify the poison ivy plant and avoid contact with the plant. This plant can be recognized by the number of leaves. Generally, poison ivy has three leaves with flowering branches on a single stem. The leaves are typically glossy, and they have jagged edges that come to a point at the front.  If you have been exposed to poison ivy, thoroughly wash with soap and water right away. You have about 30 minutes to remove the plant resin before it will cause the rash. Be sure to wash under your fingernails because any plant resin there will continue to spread the rash.  When hiking or camping, wear clothes that will help you to avoid exposure on the skin. This includes long pants, a long-sleeved shirt, tall socks, and hiking boots. You can also apply preventive lotion to your skin to help limit exposure.  If you suspect that your clothes or outdoor gear came in contact with poison ivy, rinse them off outside with a garden hose before you bring them inside your house. Contact a health care provider if:  You have open sores in the rash area.  You  have more redness, swelling, or pain in the affected area.  You have redness that spreads beyond the rash area.  You have fluid, blood, or pus coming from the affected area.  You have a fever.  You have a rash over a large area of your body.  You have a rash on your eyes, mouth, or genitals.  Your rash does not improve after a few days. Get help right away if:  Your face swells or your eyes  swell shut.  You have trouble breathing.  You have trouble swallowing. This information is not intended to replace advice given to you by your health care provider. Make sure you discuss any questions you have with your health care provider. Document Released: 06/10/2000 Document Revised: 11/24/2016 Document Reviewed: 11/19/2014 Elsevier Interactive Patient Education  [employer].   If you have lab work done today you will be contacted with your lab results within the next 2 weeks.  If you have not heard from [place] then please contact us. The fastest way to get your results is to register for My Chart.   IF you received an x-ray today, you will receive an invoice from [facility]. Please contact [facility] at [phone] with questions or concerns regarding your invoice.   IF you received labwork today, you will receive an invoice from [place]. Please contact [facility] at [phone] with questions or concerns regarding your invoice.   Our billing staff will not be able to assist you with questions regarding bills from these companies.  You will be contacted with the lab results as soon as they are available. The fastest way to get your results is to activate your My Chart account. Instructions are located on the last page of this paperwork. If you have not heard from [place] regarding the results in 2 weeks, please contact this office.

## 2018-12-05 ENCOUNTER — Other Ambulatory Visit: Payer: Self-pay

## 2018-12-05 ENCOUNTER — Encounter: Payer: Self-pay | Admitting: Orthopaedic Surgery

## 2018-12-05 ENCOUNTER — Ambulatory Visit (INDEPENDENT_AMBULATORY_CARE_PROVIDER_SITE_OTHER): Payer: Medicare PPO | Admitting: Orthopaedic Surgery

## 2018-12-05 DIAGNOSIS — M25562 Pain in left knee: Secondary | ICD-10-CM

## 2018-12-05 DIAGNOSIS — G8929 Other chronic pain: Secondary | ICD-10-CM | POA: Insufficient documentation

## 2018-12-05 MED ORDER — ALLOPURINOL 300 MG PO TABS: 300.0000 mg | ORAL_TABLET | Freq: Every day | ORAL | 0 refills | Status: DC

## 2018-12-05 NOTE — Progress Notes (Signed)
Office Visit Note   Patient: Douglas Carpenter           Date of Birth: [birth year]           MRN: [id number] Visit Date: 12/05/2018              Requested by: Wendie Agreste, MD [address] [place], [place] [address] PCP: Wendie Agreste, MD   Assessment & Plan: Visit Diagnoses:  1. Chronic pain of left knee     Plan: Impression is resolved left knee pseudogout.  Patient is doing well at this point.  I will start him on allopurinol for prophylaxis.  He will discuss this further with his primary care provider who we would like to continue writing this medication.  He will follow-up with [place] as needed.  Follow-Up Instructions: Return if symptoms worsen or fail to improve.   Orders:  No orders of the defined types were placed in this encounter.  Meds ordered this encounter  Medications  . allopurinol (ZYLOPRIM) 300 MG tablet    Sig: Take 1 tablet (300 mg total) by mouth daily.    Dispense:  30 tablet    Refill:  0      Procedures: No procedures performed   Clinical Data: No additional findings.   Subjective: Chief Complaint  Patient presents with  . Left Knee - Follow-up    HPI patient is a pleasant 63 year old gentleman who presents our clinic today for recheck of his left knee.  He was seen in our office on 10/19/2018 for this.  Knee aspiration injection performed which has significantly helped.  Aspirate sent off for cell count which turned out to be pseudogout.  He does note he has an occasional pain with increased activity, but this does improve with rest and ice.  He is also taking diclofenac.  Overall, doing much better.  Review of Systems as detailed in HPI.  All others reviewed and are negative.   Objective: Vital Signs: There were no vitals taken for this visit.  Physical Exam well-developed well-nourished gentleman in no acute distress.  Alert and oriented x3.  Ortho Exam stable exam of the left knee  Specialty Comments:  No specialty comments  available.  Imaging: No new imaging   PMFS History: Patient Active Problem List   Diagnosis Date Noted  . Chronic pain of left knee 12/05/2018  . Exercise-induced asthma 07/21/2014  . Chronic cough 07/21/2014  . Upper airway cough syndrome 07/21/2014  . Allergic rhinitis 07/21/2014  . Lumbar stenosis 11/06/2013  . Urethral stricture 01/07/2013  . Prostatic hypertrophy 11/12/2012  . VOCAL CORD DISORDER 02/19/2009  . HYPERLIPIDEMIA 01/27/2009  . Snoring 01/27/2009  . HYPERTENSION 01/27/2009  . CHRONIC RHINITIS 01/27/2009  . Asthma 01/27/2009  . GERD 01/27/2009   Past Medical History:  Diagnosis Date  . Allergy   . Arthritis    "everywhere"  . BPH (benign prostatic hypertrophy)   . Chronic rhinitis 01/27/2009  . ED (erectile dysfunction)   . GERD (gastroesophageal reflux disease)   . Headache(784.0)    migraines, as many as 3 in a week    . HYPERLIPIDEMIA 01/27/2009  . HYPERTENSION 01/27/2009  . Mild asthma    seasonal allergies, uses Proair once per yr.   . Mild obstructive sleep apnea    per pt -- mild osa test result--  no rx cpap needed  . Neuromuscular disorder ([place])   . Sleep apnea    no cpap  . Testosterone deficiency   .  Urethral stricture   . Vocal cord anomaly    pt states told from [place]  doctor heard a little gurgle sound -- no farther testing done    Family History  Problem Relation Age of Onset  . Cancer Mother        lung  . Lung cancer Mother   . Heart disease Maternal Grandmother   . Heart disease Maternal Grandfather   . Cancer Maternal Grandfather        colon, prostate,lung  . Colon cancer Neg Hx   . Colon polyps Neg Hx   . Esophageal cancer Neg Hx   . Rectal cancer Neg Hx   . Stomach cancer Neg Hx     Past Surgical History:  Procedure Laterality Date  . COLONOSCOPY    . CYSTOSCOPY WITH URETHRAL DILATATION N/A 01/07/2013   Procedure: CYSTOSCOPY WITH URETHRAL DILATATION BALLOON DILATION ;  Surgeon: Bernestine Amass, MD;  Location: [facility];  Service: Urology;  Laterality: N/A;  . LUMBAR LAMINECTOMY/DECOMPRESSION MICRODISCECTOMY Right 11/29/2012   Procedure: Right Lumbar Four to Five Laminectomy/ Decompression Microdiskectomy;  Surgeon: Otilio Connors, MD;  Location: [facility];  Service: Neurosurgery;  Laterality: Right;  LUMBAR LAMINECTOMY/DECOMPRESSION MICRODISCECTOMY 1 LEVEL  . POLYPECTOMY    . SHOULDER ARTHROSCOPY W/ ACROMIAL REPAIR Left 08-28-2002  . UPPER GASTROINTESTINAL ENDOSCOPY     Social History   Occupational History  . Occupation: retired  Tobacco Use  . Smoking status: Former Smoker    Packs/day: 0.50    Years: 22.00    Pack years: 11.00    Types: Cigarettes    Last attempt to quit: 06/27/1988    Years since quitting: 30.4  . Smokeless tobacco: Never Used  Substance and Sexual Activity  . Alcohol use: Yes    Alcohol/week: 3.0 standard drinks    Types: 3 Standard drinks or equivalent per week    Comment: 4-5 drinks / day- cognac   . Drug use: No  . Sexual activity: Not on file

## 2018-12-06 ENCOUNTER — Ambulatory Visit: Payer: Medicare PPO | Admitting: Orthopaedic Surgery

## 2018-12-31 ENCOUNTER — Ambulatory Visit (INDEPENDENT_AMBULATORY_CARE_PROVIDER_SITE_OTHER): Payer: Medicare PPO | Admitting: Family Medicine

## 2018-12-31 ENCOUNTER — Other Ambulatory Visit: Payer: Self-pay

## 2018-12-31 ENCOUNTER — Encounter: Payer: Self-pay | Admitting: Family Medicine

## 2018-12-31 VITALS — BP 120/70 | HR 92 | Temp 98.2°F | Resp 16 | Ht 67.0 in | Wt 227.0 lb

## 2018-12-31 DIAGNOSIS — L237 Allergic contact dermatitis due to plants, except food: Secondary | ICD-10-CM

## 2018-12-31 MED ORDER — PREDNISONE 20 MG PO TABS: ORAL_TABLET | ORAL | 0 refills | Status: DC

## 2018-12-31 NOTE — Patient Instructions (Addendum)
Current rash does appear to be poison ivy dermatitis.  It is possible that she could have been reexposed versus initial outbreak.  Start prednisone as we discussed.  Avoid all contact with poison ivy is much as possible over the next few weeks.  Keep exposed areas of skin covered when working in the yard, and wash with soap and water as soon as you come inside.  If areas are not improving or any worsening/spread of symptoms, be seen here or emergency room.   Return to the clinic or go to the nearest emergency room if any of your symptoms worsen or new symptoms occur.    Poison Ivy Dermatitis Poison ivy dermatitis is inflammation of the skin that is caused by chemicals in the leaves of the poison ivy plant. The skin reaction often involves redness, swelling, blisters, and extreme itching. What are the causes? This condition is caused by a chemical (urushiol) found in the sap of the poison ivy plant. This chemical is sticky and can be easily spread to people, animals, and objects. You can get poison ivy dermatitis by:  Having direct contact with a poison ivy plant.  Touching animals, other people, or objects that have come in contact with poison ivy and have the chemical on them. What increases the risk? This condition is more likely to develop in people who:  Are outdoors often in wooded or [place] areas.  Go outdoors without wearing protective clothing, such as closed shoes, long pants, and a long-sleeved shirt. What are the signs or symptoms? Symptoms of this condition include:  Redness of the skin.  Extreme itching.  A rash that often includes bumps and blisters. The rash usually appears 48 hours after exposure, if you have been exposed before. If this is the first time you have been exposed, the rash may not appear until a week after exposure.  Swelling. This may occur if the reaction is more severe. Symptoms usually last for 1-2 weeks. However, the first time you develop this  condition, symptoms may last 3-4 weeks. How is this diagnosed? This condition may be diagnosed based on your symptoms and a physical exam. Your health care provider may also ask you about any recent outdoor activity. How is this treated? Treatment for this condition will vary depending on how severe it is. Treatment may include:  Hydrocortisone cream or calamine lotion to relieve itching.  Oatmeal baths to soothe the skin.  Medicines, such as over-the-counter antihistamine tablets.  Oral steroid medicine, for more severe reactions. Follow these instructions at home: Medicines  Take or apply over-the-counter and prescription medicines only as told by your health care provider.  Use hydrocortisone cream or calamine lotion as needed to soothe the skin and relieve itching. General instructions  Do not scratch or rub your skin.  Apply a cold, wet cloth (cold compress) to the affected areas or take baths in cool water. This will help with itching. Avoid hot baths and showers.  Take oatmeal baths as needed. Use colloidal oatmeal. You can get this at your local pharmacy or grocery store. Follow the instructions on the packaging.  While you have the rash, wash clothes right after you wear them.  Keep all follow-up visits as told by your health care provider. This is important. How is this prevented?   Learn to identify the poison ivy plant and avoid contact with the plant. This plant can be recognized by the number of leaves. Generally, poison ivy has three leaves with flowering branches on  a single stem. The leaves are typically glossy, and they have jagged edges that come to a point at the front.  If you have been exposed to poison ivy, thoroughly wash with soap and water right away. You have about 30 minutes to remove the plant resin before it will cause the rash. Be sure to wash under your fingernails, because any plant resin there will continue to spread the rash.  When hiking or  camping, wear clothes that will help you to avoid exposure on the skin. This includes long pants, a long-sleeved shirt, tall socks, and hiking boots. You can also apply preventive lotion to your skin to help limit exposure.  If you suspect that your clothes or outdoor gear came in contact with poison ivy, rinse them off outside with a garden hose before you bring them inside your house.  When doing yard work or gardening, wear gloves, long sleeves, long pants, and boots. Wash your garden tools and gloves if they come in contact with poison ivy.  If you suspect that your pet has come into contact with poison ivy, wash him or her with pet shampoo and water. Make sure to wear gloves while washing your pet. Contact a health care provider if you have:  Open sores in the rash area.  More redness, swelling, or pain in the affected area.  Redness that spreads beyond the rash area.  Fluid, blood, or pus coming from the affected area.  A fever.  A rash over a large area of your body.  A rash on your eyes, mouth, or genitals.  A rash that does not improve after a few weeks. Get help right away if:  Your face swells or your eyes swell shut.  You have trouble breathing.  You have trouble swallowing. These symptoms may represent a serious problem that is an emergency. Do not wait to see if the symptoms will go away. Get medical help right away. Call your local emergency services (911 in the [place] Do not drive yourself to the hospital. Summary  Poison ivy dermatitis is inflammation of the skin that is caused by chemicals in the leaves of the poison ivy plant.  Symptoms of this condition include redness, itching, a rash, and swelling.  Do not scratch or rub your skin.  Take or apply over-the-counter and prescription medicines only as told by your health care provider. This information is not intended to replace advice given to you by your health care provider. Make sure you discuss any  questions you have with your health care provider. Document Released: 06/10/2000 Document Revised: 10/05/2018 Document Reviewed: 06/08/2018 Elsevier Patient Education  [employer].    If you have lab work done today you will be contacted with your lab results within the next 2 weeks.  If you have not heard from [place] then please contact us. The fastest way to get your results is to register for My Chart.   IF you received an x-ray today, you will receive an invoice from [facility]. Please contact [facility] at [phone] with questions or concerns regarding your invoice.   IF you received labwork today, you will receive an invoice from [place]. Please contact [facility] at [phone] with questions or concerns regarding your invoice.   Our billing staff will not be able to assist you with questions regarding bills from these companies.  You will be contacted with the lab results as soon as they are available. The fastest way to get your results is  to activate your My Chart account. Instructions are located on the last page of this paperwork. If you have not heard from us regarding the results in 2 weeks, please contact this office.      

## 2018-12-31 NOTE — Progress Notes (Signed)
Subjective:    Patient ID: Douglas Carpenter, male    DOB: 07-06-1955, 63 y.o.   MRN: [id number]  HPI Douglas Carpenter is a 63 y.o. male Presents today for: Chief Complaint  Patient presents with   Rash    pt states he has been exsposed to some posion ivy x 2 weeks ago. The spots on on his hands,chest and arms    Treated for poison ivy dermatitis at telemedicine visit May 26 with triamcinolone topical.  Had been treated for possible vasculitis and poison ivy in April with prednisone.  Tolerated prednisone prior.   Currently complains of flair of poison ivy past 2 weeks - initially L hand.  Tx: triamcinolone 2-3 times per day.  Spread to left arm and chest few days later. Spread to inside of left eye, eyebrow about a week ago. Used cream - that has improved. Still with blistering to left hand.  Continuing to work outside, but Brewing technologist. Poison ivy in neighbor's yard.   No mouth/gential lesions.   Patient Active Problem List   Diagnosis Date Noted   Chronic pain of left knee 12/05/2018   Exercise-induced asthma 07/21/2014   Chronic cough 07/21/2014   Upper airway cough syndrome 07/21/2014   Allergic rhinitis 07/21/2014   Lumbar stenosis 11/06/2013   Urethral stricture 01/07/2013   Prostatic hypertrophy 11/12/2012   VOCAL CORD DISORDER 02/19/2009   HYPERLIPIDEMIA 01/27/2009   Snoring 01/27/2009   HYPERTENSION 01/27/2009   CHRONIC RHINITIS 01/27/2009   Asthma 01/27/2009   GERD 01/27/2009   Past Medical History:  Diagnosis Date   Allergy    Arthritis    "everywhere"   BPH (benign prostatic hypertrophy)    Chronic rhinitis 01/27/2009   ED (erectile dysfunction)    GERD (gastroesophageal reflux disease)    Headache(784.0)    migraines, as many as 3 in a week     HYPERLIPIDEMIA 01/27/2009   HYPERTENSION 01/27/2009   Mild asthma    seasonal allergies, uses Proair once per yr.    Mild obstructive sleep apnea    per pt -- mild osa test result--  no rx  cpap needed   Neuromuscular disorder (HCC)    Sleep apnea    no cpap   Testosterone deficiency    Urethral stricture    Vocal cord anomaly    pt states told from [place]  doctor heard a little gurgle sound -- no farther testing done   Past Surgical History:  Procedure Laterality Date   COLONOSCOPY     CYSTOSCOPY WITH URETHRAL DILATATION N/A 01/07/2013   Procedure: CYSTOSCOPY WITH URETHRAL DILATATION BALLOON DILATION ;  Surgeon: Bernestine Amass, MD;  Location: [facility];  Service: Urology;  Laterality: N/A;   LUMBAR LAMINECTOMY/DECOMPRESSION MICRODISCECTOMY Right 11/29/2012   Procedure: Right Lumbar Four to Five Laminectomy/ Decompression Microdiskectomy;  Surgeon: Otilio Connors, MD;  Location: [facility];  Service: Neurosurgery;  Laterality: Right;  LUMBAR LAMINECTOMY/DECOMPRESSION MICRODISCECTOMY 1 LEVEL   POLYPECTOMY     SHOULDER ARTHROSCOPY W/ ACROMIAL REPAIR Left 08-28-2002   UPPER GASTROINTESTINAL ENDOSCOPY     Allergies  Allergen Reactions   Aspirin Rash    Pt can tolerate low doses-- INTOLERANT TO HIGHER DOSES   Prior to Admission medications   Medication Sig Start Date End Date Taking? Authorizing Provider  albuterol (PROAIR HFA) 108 (90 Base) MCG/ACT inhaler Inhale 2 puffs into the lungs every 6 (six) hours as needed for wheezing or shortness of breath. 04/18/18  Yes  [name], MD  allopurinol (ZYLOPRIM) 300 MG tablet Take 1 tablet (300 mg total) by mouth daily. 12/05/18  Yes Aundra Dubin, PA-C  aspirin EC 81 MG tablet Take 81 mg by mouth every morning.   Yes [provider]  Aspirin-Acetaminophen-Caffeine (EXCEDRIN PO) Take 2 tablets by mouth 2 (two) times daily as needed (migraine).   Yes [provider]  atenolol (TENORMIN) 25 MG tablet Take 25 mg by mouth every morning.    Yes [provider]  atorvastatin (LIPITOR) 80 MG tablet Take 40 mg by mouth at bedtime.   Yes [provider]  cyclobenzaprine  (FLEXERIL) 10 MG tablet Take 0.5-1 tablets (5-10 mg total) by mouth 3 (three) times daily as needed for muscle spasms. 11/08/13  Yes Jovita Gamma, MD  diclofenac (VOLTAREN) 75 MG EC tablet Take 75 mg by mouth 2 (two) times daily.    Yes [provider]  DULoxetine (CYMBALTA) 60 MG capsule Take 1 capsule (60 mg total) by mouth daily. 11/13/17  Yes Wendie Agreste, MD  fluticasone-salmeterol (ADVAIR HFA) [phone] MCG/ACT inhaler Inhale 2 puffs into the lungs 2 (two) times daily. 04/18/18  Yes [name], MD  Ketotifen Fumarate (THERA TEARS ALLERGY OP) Place 1 drop into both eyes daily as needed (dry eyes).   Yes [provider]  montelukast (SINGULAIR) 10 MG tablet Take 1 tablet (10 mg total) by mouth at bedtime. 04/03/17  Yes [name], MD  olopatadine (PATADAY) 0.1 % ophthalmic solution Place 1 drop into both eyes 2 (two) times daily. 10/15/18  Yes Yu, Amy V, PA-C  Omega-3 Fatty Acids (FISH OIL) 1000 MG CPDR Take 2,000 mg by mouth 2 (two) times daily.     Yes [provider]  omeprazole (PRILOSEC) 20 MG capsule Take 20 mg by mouth daily.    Yes [provider]  topiramate (TOPAMAX) 25 MG tablet Take 25 mg by mouth daily.    Yes [provider]  triamcinolone cream (KENALOG) 0.1 % Apply 1 application topically 3 (three) times daily. As needed to hand. 11/20/18  Yes Wendie Agreste, MD   Social History   Socioeconomic History   Marital status: Married    Spouse name: Not on file   Number of children: Not on file   Years of education: Not on file   Highest education level: Not on file  Occupational History   Occupation: retired  Scientist, product/process development strain: Not on file   Food insecurity    Worry: Not on file    Inability: Not on Lexicographer needs    Medical: Not on file    Non-medical: Not on file  Tobacco Use   Smoking status: Former Smoker    Packs/day: 0.50    Years: 22.00    Pack years: 11.00     Types: Cigarettes    Quit date: 06/27/1988    Years since quitting: 30.5   Smokeless tobacco: Never Used  Substance and Sexual Activity   Alcohol use: Yes    Alcohol/week: 3.0 standard drinks    Types: 3 Standard drinks or equivalent per week    Comment: 4-5 drinks / day- cognac    Drug use: No   Sexual activity: Not on file  Lifestyle   Physical activity    Days per week: Not on file    Minutes per session: Not on file   Stress: Not on file  Relationships   Social connections  Talks on phone: Not on file    Gets together: Not on file    Attends religious service: Not on file    Active member of club or organization: Not on file    Attends meetings of clubs or organizations: Not on file    Relationship status: Not on file   Intimate partner violence    Fear of current or ex partner: Not on file    Emotionally abused: Not on file    Physically abused: Not on file    Forced sexual activity: Not on file  Other Topics Concern   Not on file  Social History Narrative   Not on file    Review of Systems Per HPI.     Objective:   Physical Exam Constitutional:      General: He is not in acute distress.    Appearance: He is well-developed.  HENT:     Head: Normocephalic and atraumatic.  Cardiovascular:     Rate and Rhythm: Normal rate.  Pulmonary:     Effort: Pulmonary effort is normal.  Skin:    Findings: Rash present. Rash is vesicular.          Comments: Cluster of small vesicles at the interdigital space between his left thumb and second phalanx, dorsal hand, dorsal forearm, right chest wall, and the very faint area below the left eyebrow.  No other facial lesion  Neurological:     Mental Status: He is alert and oriented to person, place, and time.    Vitals:   12/31/18 1032  BP: 120/70  Pulse: 92  Resp: 16  Temp: 98.2 F (36.8 C)  TempSrc: Oral  SpO2: 95%  Weight: 227 lb (103 kg)  Height: 5\' 7"  (1.702 m)          Assessment & Plan:     Douglas Carpenter is a 63 y.o. male Poison ivy dermatitis - Plan: predniSONE (DELTASONE) 20 MG tablet  -Appears to be consistent with poison ivy dermatitis, possible reinfection versus initial infection with nonclearing.  Start prednisone with potential risks and side effects of medications discussed.  RTC precautions given, handout given.  Avoidance measures discussed.  Meds ordered this encounter  Medications   predniSONE (DELTASONE) 20 MG tablet    Sig: 3 by mouth for 3 days, then 2 by mouth for 2 days, then 1 by mouth for 2 days, then 1/2 by mouth for 2 days.    Dispense:  16 tablet    Refill:  0   Patient Instructions    Current rash does appear to be poison ivy dermatitis.  It is possible that she could have been reexposed versus initial outbreak.  Start prednisone as we discussed.  Avoid all contact with poison ivy is much as possible over the next few weeks.  Keep exposed areas of skin covered when working in the yard, and wash with soap and water as soon as you come inside.  If areas are not improving or any worsening/spread of symptoms, be seen here or emergency room.   Return to the clinic or go to the nearest emergency room if any of your symptoms worsen or new symptoms occur.    Poison Ivy Dermatitis Poison ivy dermatitis is inflammation of the skin that is caused by chemicals in the leaves of the poison ivy plant. The skin reaction often involves redness, swelling, blisters, and extreme itching. What are the causes? This condition is caused by a chemical (urushiol) found in the sap of  the poison ivy plant. This chemical is sticky and can be easily spread to people, animals, and objects. You can get poison ivy dermatitis by:  Having direct contact with a poison ivy plant.  Touching animals, other people, or objects that have come in contact with poison ivy and have the chemical on them. What increases the risk? This condition is more likely to develop in people  who:  Are outdoors often in wooded or [place] areas.  Go outdoors without wearing protective clothing, such as closed shoes, long pants, and a long-sleeved shirt. What are the signs or symptoms? Symptoms of this condition include:  Redness of the skin.  Extreme itching.  A rash that often includes bumps and blisters. The rash usually appears 48 hours after exposure, if you have been exposed before. If this is the first time you have been exposed, the rash may not appear until a week after exposure.  Swelling. This may occur if the reaction is more severe. Symptoms usually last for 1-2 weeks. However, the first time you develop this condition, symptoms may last 3-4 weeks. How is this diagnosed? This condition may be diagnosed based on your symptoms and a physical exam. Your health care provider may also ask you about any recent outdoor activity. How is this treated? Treatment for this condition will vary depending on how severe it is. Treatment may include:  Hydrocortisone cream or calamine lotion to relieve itching.  Oatmeal baths to soothe the skin.  Medicines, such as over-the-counter antihistamine tablets.  Oral steroid medicine, for more severe reactions. Follow these instructions at home: Medicines  Take or apply over-the-counter and prescription medicines only as told by your health care provider.  Use hydrocortisone cream or calamine lotion as needed to soothe the skin and relieve itching. General instructions  Do not scratch or rub your skin.  Apply a cold, wet cloth (cold compress) to the affected areas or take baths in cool water. This will help with itching. Avoid hot baths and showers.  Take oatmeal baths as needed. Use colloidal oatmeal. You can get this at your local pharmacy or grocery store. Follow the instructions on the packaging.  While you have the rash, wash clothes right after you wear them.  Keep all follow-up visits as told by your health care  provider. This is important. How is this prevented?   Learn to identify the poison ivy plant and avoid contact with the plant. This plant can be recognized by the number of leaves. Generally, poison ivy has three leaves with flowering branches on a single stem. The leaves are typically glossy, and they have jagged edges that come to a point at the front.  If you have been exposed to poison ivy, thoroughly wash with soap and water right away. You have about 30 minutes to remove the plant resin before it will cause the rash. Be sure to wash under your fingernails, because any plant resin there will continue to spread the rash.  When hiking or camping, wear clothes that will help you to avoid exposure on the skin. This includes long pants, a long-sleeved shirt, tall socks, and hiking boots. You can also apply preventive lotion to your skin to help limit exposure.  If you suspect that your clothes or outdoor gear came in contact with poison ivy, rinse them off outside with a garden hose before you bring them inside your house.  When doing yard work or gardening, wear gloves, long sleeves, long pants, and boots. Wash your  garden tools and gloves if they come in contact with poison ivy.  If you suspect that your pet has come into contact with poison ivy, wash him or her with pet shampoo and water. Make sure to wear gloves while washing your pet. Contact a health care provider if you have:  Open sores in the rash area.  More redness, swelling, or pain in the affected area.  Redness that spreads beyond the rash area.  Fluid, blood, or pus coming from the affected area.  A fever.  A rash over a large area of your body.  A rash on your eyes, mouth, or genitals.  A rash that does not improve after a few weeks. Get help right away if:  Your face swells or your eyes swell shut.  You have trouble breathing.  You have trouble swallowing. These symptoms may represent a serious problem that is  an emergency. Do not wait to see if the symptoms will go away. Get medical help right away. Call your local emergency services (911 in the [place] Do not drive yourself to the hospital. Summary  Poison ivy dermatitis is inflammation of the skin that is caused by chemicals in the leaves of the poison ivy plant.  Symptoms of this condition include redness, itching, a rash, and swelling.  Do not scratch or rub your skin.  Take or apply over-the-counter and prescription medicines only as told by your health care provider. This information is not intended to replace advice given to you by your health care provider. Make sure you discuss any questions you have with your health care provider. Document Released: 06/10/2000 Document Revised: 10/05/2018 Document Reviewed: 06/08/2018 Elsevier Patient Education  [employer].    If you have lab work done today you will be contacted with your lab results within the next 2 weeks.  If you have not heard from [place] then please contact us. The fastest way to get your results is to register for My Chart.   IF you received an x-ray today, you will receive an invoice from [facility]. Please contact [facility] at [phone] with questions or concerns regarding your invoice.   IF you received labwork today, you will receive an invoice from [facility]. Please contact [facility] at [phone] with questions or concerns regarding your invoice.   Our billing staff will not be able to assist you with questions regarding bills from these companies.  You will be contacted with the lab results as soon as they are available. The fastest way to get your results is to activate your My Chart account. Instructions are located on the last page of this paperwork. If you have not heard from [place] regarding the results in 2 weeks, please contact this office.       Signed,   Merri Ray, MD [facility].   12/31/18 11:28 AM

## 2019-01-14 DIAGNOSIS — M5442 Lumbago with sciatica, left side: Secondary | ICD-10-CM | POA: Diagnosis not present

## 2019-01-14 DIAGNOSIS — M47816 Spondylosis without myelopathy or radiculopathy, lumbar region: Secondary | ICD-10-CM | POA: Diagnosis not present

## 2019-01-14 DIAGNOSIS — Z981 Arthrodesis status: Secondary | ICD-10-CM | POA: Diagnosis not present

## 2019-01-14 DIAGNOSIS — G8929 Other chronic pain: Secondary | ICD-10-CM | POA: Diagnosis not present

## 2019-01-14 DIAGNOSIS — M5136 Other intervertebral disc degeneration, lumbar region: Secondary | ICD-10-CM | POA: Diagnosis not present

## 2019-01-14 DIAGNOSIS — Q7649 Other congenital malformations of spine, not associated with scoliosis: Secondary | ICD-10-CM | POA: Diagnosis not present

## 2019-01-28 ENCOUNTER — Ambulatory Visit (HOSPITAL_COMMUNITY)
Admission: EM | Admit: 2019-01-28 | Discharge: 2019-01-28 | Disposition: A | Discharge status: Home / Self Care | Payer: Medicare PPO | Attending: Emergency Medicine | Admitting: Emergency Medicine

## 2019-01-28 ENCOUNTER — Other Ambulatory Visit: Payer: Self-pay

## 2019-01-28 DIAGNOSIS — Z20828 Contact with and (suspected) exposure to other viral communicable diseases: Secondary | ICD-10-CM | POA: Diagnosis not present

## 2019-01-28 DIAGNOSIS — Z7189 Other specified counseling: Secondary | ICD-10-CM | POA: Diagnosis not present

## 2019-01-28 DIAGNOSIS — Z1159 Encounter for screening for other viral diseases: Secondary | ICD-10-CM | POA: Diagnosis not present

## 2019-01-28 NOTE — ED Provider Notes (Addendum)
[place]    CSN: [id number] Arrival date & time: 01/28/19  7824      History   Chief Complaint No chief complaint on file. COVID testing  HPI Douglas Carpenter is a 63 y.o. male history of hypertension, hyperlipidemia, BPH, GERD, presenting today for evaluation of COVID testing.  Patient states that his wife was getting tested and he wished to be tested as well.  He denies any current symptoms or known exposures.  Denies fevers, body aches or chills.  Denies cough congestion or sore throat.  Denies nausea or vomiting.  Energy level is been normal.  Only him and his wife at home.  HPI  Past Medical History:  Diagnosis Date  . Allergy   . Arthritis    "everywhere"  . BPH (benign prostatic hypertrophy)   . Chronic rhinitis 01/27/2009  . ED (erectile dysfunction)   . GERD (gastroesophageal reflux disease)   . Headache(784.0)    migraines, as many as 3 in a week    . HYPERLIPIDEMIA 01/27/2009  . HYPERTENSION 01/27/2009  . Mild asthma    seasonal allergies, uses Proair once per yr.   . Mild obstructive sleep apnea    per pt -- mild [facility] test result--  no rx cpap needed  . Neuromuscular disorder ([place])   . Sleep apnea    no cpap  . Testosterone deficiency   . Urethral stricture   . Vocal cord anomaly    pt states told from [place]  doctor heard a little gurgle sound -- no farther testing done    Patient Active Problem List   Diagnosis Date Noted  . Chronic pain of left knee 12/05/2018  . Exercise-induced asthma 07/21/2014  . Chronic cough 07/21/2014  . Upper airway cough syndrome 07/21/2014  . Allergic rhinitis 07/21/2014  . Lumbar stenosis 11/06/2013  . Urethral stricture 01/07/2013  . Prostatic hypertrophy 11/12/2012  . VOCAL CORD DISORDER 02/19/2009  . HYPERLIPIDEMIA 01/27/2009  . Snoring 01/27/2009  . HYPERTENSION 01/27/2009  . CHRONIC RHINITIS 01/27/2009  . Asthma 01/27/2009  . GERD 01/27/2009    Past Surgical History:  Procedure Laterality Date  .  COLONOSCOPY    . CYSTOSCOPY WITH URETHRAL DILATATION N/A 01/07/2013   Procedure: CYSTOSCOPY WITH URETHRAL DILATATION BALLOON DILATION ;  Surgeon: Bernestine Amass, MD;  Location: [facility];  Service: Urology;  Laterality: N/A;  . LUMBAR LAMINECTOMY/DECOMPRESSION MICRODISCECTOMY Right 11/29/2012   Procedure: Right Lumbar Four to Five Laminectomy/ Decompression Microdiskectomy;  Surgeon: Otilio Connors, MD;  Location: [facility];  Service: Neurosurgery;  Laterality: Right;  LUMBAR LAMINECTOMY/DECOMPRESSION MICRODISCECTOMY 1 LEVEL  . POLYPECTOMY    . SHOULDER ARTHROSCOPY W/ ACROMIAL REPAIR Left 08-28-2002  . UPPER GASTROINTESTINAL ENDOSCOPY         Home Medications    Prior to Admission medications   Medication Sig Start Date End Date Taking? Authorizing Provider  albuterol (PROAIR HFA) 108 (90 Base) MCG/ACT inhaler Inhale 2 puffs into the lungs every 6 (six) hours as needed for wheezing or shortness of breath. 04/18/18   [name], MD  allopurinol (ZYLOPRIM) 300 MG tablet Take 1 tablet (300 mg total) by mouth daily. 12/05/18   Aundra Dubin, PA-C  aspirin EC 81 MG tablet Take 81 mg by mouth every morning.    [provider]  Aspirin-Acetaminophen-Caffeine (EXCEDRIN PO) Take 2 tablets by mouth 2 (two) times daily as needed (migraine).    [provider]  atenolol (TENORMIN) 25 MG tablet Take  25 mg by mouth every morning.     [provider]  atorvastatin (LIPITOR) 80 MG tablet Take 40 mg by mouth at bedtime.    [provider]  cyclobenzaprine (FLEXERIL) 10 MG tablet Take 0.5-1 tablets (5-10 mg total) by mouth 3 (three) times daily as needed for muscle spasms. 11/08/13   Jovita Gamma, MD  diclofenac (VOLTAREN) 75 MG EC tablet Take 75 mg by mouth 2 (two) times daily.     [provider]  DULoxetine (CYMBALTA) 60 MG capsule Take 1 capsule (60 mg total) by mouth daily. 11/13/17   Wendie Agreste, MD  fluticasone-salmeterol  (ADVAIR HFA) [phone] MCG/ACT inhaler Inhale 2 puffs into the lungs 2 (two) times daily. 04/18/18   [name], MD  Ketotifen Fumarate (THERA TEARS ALLERGY OP) Place 1 drop into both eyes daily as needed (dry eyes).    [provider]  montelukast (SINGULAIR) 10 MG tablet Take 1 tablet (10 mg total) by mouth at bedtime. 04/03/17   [name], MD  olopatadine (PATADAY) 0.1 % ophthalmic solution Place 1 drop into both eyes 2 (two) times daily. 10/15/18   Tasia Catchings, Amy V, PA-C  Omega-3 Fatty Acids (FISH OIL) 1000 MG CPDR Take 2,000 mg by mouth 2 (two) times daily.      [provider]  omeprazole (PRILOSEC) 20 MG capsule Take 20 mg by mouth daily.     [provider]  predniSONE (DELTASONE) 20 MG tablet 3 by mouth for 3 days, then 2 by mouth for 2 days, then 1 by mouth for 2 days, then 1/2 by mouth for 2 days. 12/31/18   Wendie Agreste, MD  topiramate (TOPAMAX) 25 MG tablet Take 25 mg by mouth daily.     [provider]  triamcinolone cream (KENALOG) 0.1 % Apply 1 application topically 3 (three) times daily. As needed to hand. 11/20/18   Wendie Agreste, MD    Family History Family History  Problem Relation Age of Onset  . Cancer Mother        lung  . Lung cancer Mother   . Heart disease Maternal Grandmother   . Heart disease Maternal Grandfather   . Cancer Maternal Grandfather        colon, prostate,lung  . Colon cancer Neg Hx   . Colon polyps Neg Hx   . Esophageal cancer Neg Hx   . Rectal cancer Neg Hx   . Stomach cancer Neg Hx     Social History Social History   Tobacco Use  . Smoking status: Former Smoker    Packs/day: 0.50    Years: 22.00    Pack years: 11.00    Types: Cigarettes    Quit date: 06/27/1988    Years since quitting: 30.6  . Smokeless tobacco: Never Used  Substance Use Topics  . Alcohol use: Yes    Alcohol/week: 3.0 standard drinks    Types: 3 Standard drinks or equivalent per week    Comment: 4-5 drinks / day- cognac   . Drug  use: No     Allergies   Aspirin   Review of Systems Review of Systems  Constitutional: Negative for activity change, appetite change, chills, fatigue and fever.  HENT: Negative for congestion, ear pain, rhinorrhea, sinus pressure, sore throat and trouble swallowing.   Eyes: Negative for discharge and redness.  Respiratory: Negative for cough, chest tightness and shortness of breath.   Cardiovascular: Negative for chest pain.  Gastrointestinal: Negative for abdominal pain, diarrhea, nausea  and vomiting.  Musculoskeletal: Negative for myalgias.  Skin: Negative for rash.  Neurological: Negative for dizziness, light-headedness and headaches.     Physical Exam Triage Vital Signs ED Triage Vitals [01/28/19 0841]  Enc Vitals Group     BP (!) 156/82     Pulse Rate 78     Resp 18     Temp 97.8 F (36.6 C)     Temp Source Temporal     SpO2 100 %     Weight      Height      Head Circumference      Peak Flow      Pain Score      Pain Loc      Pain Edu?      Excl. in [place]?    No data found.  Updated Vital Signs BP (!) 156/82 (BP Location: Left Arm)   Pulse 78   Temp 97.8 F (36.6 C) (Temporal)   Resp 18   SpO2 100%   Visual Acuity Right Eye Distance:   Left Eye Distance:   Bilateral Distance:    Right Eye Near:   Left Eye Near:    Bilateral Near:     Physical Exam Vitals signs and nursing note reviewed.  Constitutional:      Appearance: He is well-developed.  HENT:     Head: Normocephalic and atraumatic.  Eyes:     Conjunctiva/sclera: Conjunctivae normal.  Neck:     Musculoskeletal: Neck supple.  Cardiovascular:     Rate and Rhythm: Normal rate and regular rhythm.     Heart sounds: No murmur.  Pulmonary:     Effort: Pulmonary effort is normal. No respiratory distress.     Breath sounds: Normal breath sounds.     Comments: Breathing comfortably at rest, CTABL, no wheezing, rales or other adventitious sounds auscultated Abdominal:     Palpations: Abdomen  is soft.     Tenderness: There is no abdominal tenderness.  Skin:    General: Skin is warm and dry.  Neurological:     Mental Status: He is alert.      UC Treatments / Results  Labs (all labs ordered are listed, but only abnormal results are displayed) Labs Reviewed  NOVEL CORONAVIRUS, NAA (HOSPITAL ORDER, SEND-OUT TO REF LAB)    EKG   Radiology No results found.  Procedures Procedures (including critical care time)  Medications Ordered in UC Medications - No data to display  Initial Impression / Assessment and Plan / UC Course  I have reviewed the triage vital signs and the nursing notes.  Pertinent labs & imaging results that were available during my care of the patient were reviewed by me and considered in my medical decision making (see chart for details).    Asymptomatic, vital signs stable, exam unremarkable.  COVID testing obtained.  Discussed recommendations for COVID, will call if positive.Discussed strict return precautions. Patient verbalized understanding and is agreeable with plan.  Final Clinical Impressions(s) / UC Diagnoses   Final diagnoses:  Advice Given About Covid-19 Virus Infection     Discharge Instructions        Person Under Monitoring Name: Douglas Carpenter  Location: [place] [address]   Infection Prevention Recommendations for Individuals Confirmed to have, or Being Evaluated for, 2019 Novel Coronavirus (COVID-19) Infection Who Receive Care at Home  Individuals who are confirmed to have, or are being evaluated for, COVID-19 should follow the prevention steps below until a healthcare provider  or local or state health department says they can return to normal activities.  Stay home except to get medical care You should restrict activities outside your home, except for getting medical care. Do not go to work, school, or public areas, and do not use public transportation or taxis.  Call ahead before visiting your  doctor Before your medical appointment, call the healthcare provider and tell them that you have, or are being evaluated for, COVID-19 infection. This will help the healthcare provider's office take steps to keep other people from getting infected. Ask your healthcare provider to call the local or state health department.  Monitor your symptoms Seek prompt medical attention if your illness is worsening (e.g., difficulty breathing). Before going to your medical appointment, call the healthcare provider and tell them that you have, or are being evaluated for, COVID-19 infection. Ask your healthcare provider to call the local or state health department.  Wear a facemask You should wear a facemask that covers your nose and mouth when you are in the same room with other people and when you visit a healthcare provider. People who live with or visit you should also wear a facemask while they are in the same room with you.  Separate yourself from other people in your home As much as possible, you should stay in a different room from other people in your home. Also, you should use a separate bathroom, if available.  Avoid sharing household items You should not share dishes, drinking glasses, cups, eating utensils, towels, bedding, or other items with other people in your home. After using these items, you should wash them thoroughly with soap and water.  Cover your coughs and sneezes Cover your mouth and nose with a tissue when you cough or sneeze, or you can cough or sneeze into your sleeve. Throw used tissues in a lined trash can, and immediately wash your hands with soap and water for at least 20 seconds or use an alcohol-based hand rub.  Wash your [facility] your hands often and thoroughly with soap and water for at least 20 seconds. You can use an alcohol-based hand sanitizer if soap and water are not available and if your hands are not visibly dirty. Avoid touching your eyes, nose, and  mouth with unwashed hands.   Prevention Steps for Caregivers and Household Members of Individuals Confirmed to have, or Being Evaluated for, COVID-19 Infection Being Cared for in the Home  If you live with, or provide care at home for, a person confirmed to have, or being evaluated for, COVID-19 infection please follow these guidelines to prevent infection:  Follow healthcare provider's instructions Make sure that you understand and can help the patient follow any healthcare provider instructions for all care.  Provide for the patient's basic needs You should help the patient with basic needs in the home and provide support for getting groceries, prescriptions, and other personal needs.  Monitor the patient's symptoms If they are getting sicker, call his or her medical provider and tell them that the patient has, or is being evaluated for, COVID-19 infection. This will help the healthcare provider's office take steps to keep other people from getting infected. Ask the healthcare provider to call the local or state health department.  Limit the number of people who have contact with the patient  If possible, have only one caregiver for the patient.  Other household members should stay in another home or place of residence. If this is not possible, they should  stay  in another room, or be separated from the patient as much as possible. Use a separate bathroom, if available.  Restrict visitors who do not have an essential need to be in the home.  Keep older adults, very young children, and other sick people away from the patient Keep older adults, very young children, and those who have compromised immune systems or chronic health conditions away from the patient. This includes people with chronic heart, lung, or kidney conditions, diabetes, and cancer.  Ensure good ventilation Make sure that shared spaces in the home have good air flow, such as from an air conditioner or an opened  window, weather permitting.  Wash your hands often  Wash your hands often and thoroughly with soap and water for at least 20 seconds. You can use an alcohol based hand sanitizer if soap and water are not available and if your hands are not visibly dirty.  Avoid touching your eyes, nose, and mouth with unwashed hands.  Use disposable paper towels to dry your hands. If not available, use dedicated cloth towels and replace them when they become wet.  Wear a facemask and gloves  Wear a disposable facemask at all times in the room and gloves when you touch or have contact with the patient's blood, body fluids, and/or secretions or excretions, such as sweat, saliva, sputum, nasal mucus, vomit, urine, or feces.  Ensure the mask fits over your nose and mouth tightly, and do not touch it during use.  Throw out disposable facemasks and gloves after using them. Do not reuse.  Wash your hands immediately after removing your facemask and gloves.  If your personal clothing becomes contaminated, carefully remove clothing and launder. Wash your hands after handling contaminated clothing.  Place all used disposable facemasks, gloves, and other waste in a lined container before disposing them with other household waste.  Remove gloves and wash your hands immediately after handling these items.  Do not share dishes, glasses, or other household items with the patient  Avoid sharing household items. You should not share dishes, drinking glasses, cups, eating utensils, towels, bedding, or other items with a patient who is confirmed to have, or being evaluated for, COVID-19 infection.  After the person uses these items, you should wash them thoroughly with soap and water.  Wash laundry thoroughly  Immediately remove and wash clothes or bedding that have blood, body fluids, and/or secretions or excretions, such as sweat, saliva, sputum, nasal mucus, vomit, urine, or feces, on them.  Wear gloves when  handling laundry from the patient.  Read and follow directions on labels of laundry or clothing items and detergent. In general, wash and dry with the warmest temperatures recommended on the label.  Clean all areas the individual has used often  Clean all touchable surfaces, such as counters, tabletops, doorknobs, bathroom fixtures, toilets, phones, keyboards, tablets, and bedside tables, every day. Also, clean any surfaces that may have blood, body fluids, and/or secretions or excretions on them.  Wear gloves when cleaning surfaces the patient has come in contact with.  Use a diluted bleach solution (e.g., dilute bleach with 1 part bleach and 10 parts water) or a household disinfectant with a label that says EPA-registered for coronaviruses. To make a bleach solution at home, add 1 tablespoon of bleach to 1 quart (4 cups) of water. For a larger supply, add  cup of bleach to 1 gallon (16 cups) of water.  Read labels of cleaning products and follow recommendations provided on  product labels. Labels contain instructions for safe and effective use of the cleaning product including precautions you should take when applying the product, such as wearing gloves or eye protection and making sure you have good ventilation during use of the product.  Remove gloves and wash hands immediately after cleaning.  Monitor yourself for signs and symptoms of illness Caregivers and household members are considered close contacts, should monitor their health, and will be asked to limit movement outside of the home to the extent possible. Follow the monitoring steps for close contacts listed on the symptom monitoring form.   ? If you have additional questions, contact your local health department or call the epidemiologist on call at [phone] (available 24/7). ? This guidance is subject to change. For the most up-to-date guidance from [facility], please refer to their website:  YouBlogs.pl    ED Prescriptions    None     Controlled Substance Prescriptions [place] Controlled Substance Registry consulted? Not Applicable   Shelton Silvas 01/28/19 0916    [name], PA-C 01/28/19 [id number]

## 2019-01-28 NOTE — Discharge Instructions (Signed)
Person Under Monitoring Name: Douglas Carpenter  Location: [place] [address]   Infection Prevention Recommendations for Individuals Confirmed to have, or Being Evaluated for, 2019 Novel Coronavirus (COVID-19) Infection Who Receive Care at Home  Individuals who are confirmed to have, or are being evaluated for, COVID-19 should follow the prevention steps below until a healthcare provider or local or state health department says they can return to normal activities.  Stay home except to get medical care You should restrict activities outside your home, except for getting medical care. Do not go to work, school, or public areas, and do not use public transportation or taxis.  Call ahead before visiting your doctor Before your medical appointment, call the healthcare provider and tell them that you have, or are being evaluated for, COVID-19 infection. This will help the healthcare providers office take steps to keep other people from getting infected. Ask your healthcare provider to call the local or state health department.  Monitor your symptoms Seek prompt medical attention if your illness is worsening (e.g., difficulty breathing). Before going to your medical appointment, call the healthcare provider and tell them that you have, or are being evaluated for, COVID-19 infection. Ask your healthcare provider to call the local or state health department.  Wear a facemask You should wear a facemask that covers your nose and mouth when you are in the same room with other people and when you visit a healthcare provider. People who live with or visit you should also wear a facemask while they are in the same room with you.  Separate yourself from other people in your home As much as possible, you should stay in a different room from other people in your home. Also, you should use a separate bathroom, if available.  Avoid sharing household items You should not share  dishes, drinking glasses, cups, eating utensils, towels, bedding, or other items with other people in your home. After using these items, you should wash them thoroughly with soap and water.  Cover your coughs and sneezes Cover your mouth and nose with a tissue when you cough or sneeze, or you can cough or sneeze into your sleeve. Throw used tissues in a lined trash can, and immediately wash your hands with soap and water for at least 20 seconds or use an alcohol-based hand rub.  Wash your [facility] your hands often and thoroughly with soap and water for at least 20 seconds. You can use an alcohol-based hand sanitizer if soap and water are not available and if your hands are not visibly dirty. Avoid touching your eyes, nose, and mouth with unwashed hands.   Prevention Steps for Caregivers and Household Members of Individuals Confirmed to have, or Being Evaluated for, COVID-19 Infection Being Cared for in the Home  If you live with, or provide care at home for, a person confirmed to have, or being evaluated for, COVID-19 infection please follow these guidelines to prevent infection:  Follow healthcare providers instructions Make sure that you understand and can help the patient follow any healthcare provider instructions for all care.  Provide for the patients basic needs You should help the patient with basic needs in the home and provide support for getting groceries, prescriptions, and other personal needs.  Monitor the patients symptoms If they are getting sicker, call his or her medical provider and tell them that the patient has, or is being evaluated for, COVID-19 infection. This will help the healthcare providers office  take steps to keep other people from getting infected. °Ask the healthcare provider to call the local or state health department. ° °Limit the number of people who have contact with the patient °If possible, have only one caregiver for the patient. °Other  household members should stay in another home or place of residence. If this is not possible, they should stay °in another room, or be separated from the patient as much as possible. Use a separate bathroom, if available. °Restrict visitors who do not have an essential need to be in the home. ° °Keep older adults, very young children, and other sick people away from the patient °Keep older adults, very young children, and those who have compromised immune systems or chronic health conditions away from the patient. This includes people with chronic heart, lung, or kidney conditions, diabetes, and cancer. ° °Ensure good ventilation °Make sure that shared spaces in the home have good air flow, such as from an air conditioner or an opened window, °weather permitting. ° °Wash your hands often °Wash your hands often and thoroughly with soap and water for at least 20 seconds. You can use an alcohol based hand sanitizer if soap and water are not available and if your hands are not visibly dirty. °Avoid touching your eyes, nose, and mouth with unwashed hands. °Use disposable paper towels to dry your hands. If not available, use dedicated cloth towels and replace them when they become wet. ° °Wear a facemask and gloves °Wear a disposable facemask at all times in the room and gloves when you touch or have contact with the patient’s blood, body fluids, and/or secretions or excretions, such as sweat, saliva, sputum, nasal mucus, vomit, urine, or feces.  Ensure the mask fits over your nose and mouth tightly, and do not touch it during use. °Throw out disposable facemasks and gloves after using them. Do not reuse. °Wash your hands immediately after removing your facemask and gloves. °If your personal clothing becomes contaminated, carefully remove clothing and launder. Wash your hands after handling contaminated clothing. °Place all used disposable facemasks, gloves, and other waste in a lined container before disposing them with  other household waste. °Remove gloves and wash your hands immediately after handling these items. ° °Do not share dishes, glasses, or other household items with the patient °Avoid sharing household items. You should not share dishes, drinking glasses, cups, eating utensils, towels, bedding, or other items with a patient who is confirmed to have, or being evaluated for, COVID-19 infection. °After the person uses these items, you should wash them thoroughly with soap and water. ° °Wash laundry thoroughly °Immediately remove and wash clothes or bedding that have blood, body fluids, and/or secretions or excretions, such as sweat, saliva, sputum, nasal mucus, vomit, urine, or feces, on them. °Wear gloves when handling laundry from the patient. °Read and follow directions on labels of laundry or clothing items and detergent. In general, wash and dry with the warmest temperatures recommended on the label. ° °Clean all areas the individual has used often °Clean all touchable surfaces, such as counters, tabletops, doorknobs, bathroom fixtures, toilets, phones, keyboards, tablets, and bedside tables, every day. Also, clean any surfaces that may have blood, body fluids, and/or secretions or excretions on them. °Wear gloves when cleaning surfaces the patient has come in contact with. °Use a diluted bleach solution (e.g., dilute bleach with 1 part bleach and 10 parts water) or a household disinfectant with a label that says EPA-registered for coronaviruses. To make a bleach   solution at home, add 1 tablespoon of bleach to 1 quart (4 cups) of water. For a larger supply, add  cup of bleach to 1 gallon (16 cups) of water. Read labels of cleaning products and follow recommendations provided on product labels. Labels contain instructions for safe and effective use of the cleaning product including precautions you should take when applying the product, such as wearing gloves or eye protection and making sure you have good ventilation  during use of the product. Remove gloves and wash hands immediately after cleaning.  Monitor yourself for signs and symptoms of illness Caregivers and household members are considered close contacts, should monitor their health, and will be asked to limit movement outside of the home to the extent possible. Follow the monitoring steps for close contacts listed on the symptom monitoring form.   ? If you have additional questions, contact your local health department or call the epidemiologist on call at [phone] (available 24/7). ? This guidance is subject to change. For the most up-to-date guidance from [facility], please refer to their website: [email or web address]

## 2019-01-30 LAB — NOVEL CORONAVIRUS, NAA (HOSP ORDER, SEND-OUT TO REF LAB; TAT 18-24 HRS): SARS-CoV-2, NAA: NOT DETECTED

## 2019-01-31 DIAGNOSIS — M5136 Other intervertebral disc degeneration, lumbar region: Secondary | ICD-10-CM | POA: Diagnosis not present

## 2019-01-31 DIAGNOSIS — M48061 Spinal stenosis, lumbar region without neurogenic claudication: Secondary | ICD-10-CM | POA: Diagnosis not present

## 2019-01-31 DIAGNOSIS — M4726 Other spondylosis with radiculopathy, lumbar region: Secondary | ICD-10-CM | POA: Diagnosis not present

## 2019-02-20 ENCOUNTER — Encounter: Payer: Self-pay | Admitting: Orthopaedic Surgery

## 2019-02-20 ENCOUNTER — Ambulatory Visit (INDEPENDENT_AMBULATORY_CARE_PROVIDER_SITE_OTHER): Payer: Medicare PPO

## 2019-02-20 ENCOUNTER — Telehealth: Payer: Self-pay | Admitting: Radiology

## 2019-02-20 ENCOUNTER — Ambulatory Visit (INDEPENDENT_AMBULATORY_CARE_PROVIDER_SITE_OTHER): Payer: Medicare PPO | Admitting: Orthopaedic Surgery

## 2019-02-20 DIAGNOSIS — M1711 Unilateral primary osteoarthritis, right knee: Secondary | ICD-10-CM | POA: Diagnosis not present

## 2019-02-20 MED ORDER — LIDOCAINE HCL 1 % IJ SOLN
2.0000 mL | INTRAMUSCULAR | Status: AC | PRN
  Administered 2019-02-20: 2 mL

## 2019-02-20 MED ORDER — METHYLPREDNISOLONE ACETATE 40 MG/ML IJ SUSP
40.0000 mg | INTRAMUSCULAR | Status: AC | PRN
  Administered 2019-02-20: 40 mg via INTRA_ARTICULAR

## 2019-02-20 MED ORDER — BUPIVACAINE HCL 0.5 % IJ SOLN
2.0000 mL | INTRAMUSCULAR | Status: AC | PRN
  Administered 2019-02-20: 15:00:00 2 mL via INTRA_ARTICULAR

## 2019-02-20 NOTE — Progress Notes (Signed)
Office Visit Note   Patient: Douglas Carpenter           Date of Birth: 1955/08/22           MRN: [id number] Visit Date: 02/20/2019              Requested by: Wendie Agreste, MD [address] [place],  [place] [address] PCP: Wendie Agreste, MD   Assessment & Plan: Visit Diagnoses:  1. Primary osteoarthritis of right knee     Plan: Impression is a moderate left knee degenerative joint disease.  We reinjected his knee with cortisone today.  We did discuss Visco supplementation.  Patient will also evaluate for knee replacement should the pain become worse.  Questions encouraged and answered.  Follow-up as needed.  Follow-Up Instructions: Return if symptoms worsen or fail to improve.   Orders:  Orders Placed This Encounter  Procedures  . XR KNEE 3 VIEW RIGHT   No orders of the defined types were placed in this encounter.     Procedures: Large Joint Inj: L knee on 02/20/2019 2:40 PM Details: 22 G needle Medications: 2 mL bupivacaine 0.5 %; 2 mL lidocaine 1 %; 40 mg methylPREDNISolone acetate 40 MG/ML Outcome: tolerated well, no immediate complications Patient was prepped and draped in the usual sterile fashion.       Clinical Data: No additional findings.   Subjective: Chief Complaint  Patient presents with  . Left Knee - Pain  . Right Knee - Pain    Tremain follows up today for his recurrent left knee pain.  Previously saw him in April performed aspiration injection which turned out to be pseudogout.  He states that the cortisone did help but he still has chronic aching pain that is worse with inactivity and with prolonged standing.  He reports no changes in medical history otherwise.   Review of Systems  Constitutional: Negative.   All other systems reviewed and are negative.    Objective: Vital Signs: There were no vitals taken for this visit.  Physical Exam Vitals signs and nursing note reviewed.  Constitutional:      Appearance: He is well-developed.   Pulmonary:     Effort: Pulmonary effort is normal.  Abdominal:     Palpations: Abdomen is soft.  Skin:    General: Skin is warm.  Neurological:     Mental Status: He is alert and oriented to person, place, and time.  Psychiatric:        Behavior: Behavior normal.        Thought Content: Thought content normal.        Judgment: Judgment normal.     Ortho Exam Left knee exam shows no joint effusion.  Normal range of motion.  Collaterals cruciates are stable. Specialty Comments:  No specialty comments available.  Imaging: Xr Knee 3 View Right  Result Date: 02/20/2019 Moderate DJD worst in PF compartment.    PMFS History: Patient Active Problem List   Diagnosis Date Noted  . Primary osteoarthritis of right knee 02/20/2019  . Chronic pain of left knee 12/05/2018  . Exercise-induced asthma 07/21/2014  . Chronic cough 07/21/2014  . Upper airway cough syndrome 07/21/2014  . Allergic rhinitis 07/21/2014  . Lumbar stenosis 11/06/2013  . Urethral stricture 01/07/2013  . Prostatic hypertrophy 11/12/2012  . VOCAL CORD DISORDER 02/19/2009  . HYPERLIPIDEMIA 01/27/2009  . Snoring 01/27/2009  . HYPERTENSION 01/27/2009  . CHRONIC RHINITIS 01/27/2009  . Asthma 01/27/2009  . GERD 01/27/2009  Past Medical History:  Diagnosis Date  . Allergy   . Arthritis    "everywhere"  . BPH (benign prostatic hypertrophy)   . Chronic rhinitis 01/27/2009  . ED (erectile dysfunction)   . GERD (gastroesophageal reflux disease)   . Headache(784.0)    migraines, as many as 3 in a week    . HYPERLIPIDEMIA 01/27/2009  . HYPERTENSION 01/27/2009  . Mild asthma    seasonal allergies, uses Proair once per yr.   . Mild obstructive sleep apnea    per pt -- mild [facility]sa test result--  no rx cpap needed  . Neuromuscular disorder ([place])   . Sleep apnea    no cpap  . Testosterone deficiency   . Urethral stricture   . Vocal cord anomaly    pt states told from [place]  doctor heard a little gurgle sound -- no  farther testing done    Family History  Problem Relation Age of Onset  . Cancer Mother        lung  . Lung cancer Mother   . Heart disease Maternal Grandmother   . Heart disease Maternal Grandfather   . Cancer Maternal Grandfather        colon, prostate,lung  . Colon cancer Neg Hx   . Colon polyps Neg Hx   . Esophageal cancer Neg Hx   . Rectal cancer Neg Hx   . Stomach cancer Neg Hx     Past Surgical History:  Procedure Laterality Date  . COLONOSCOPY    . CYSTOSCOPY WITH URETHRAL DILATATION N/A 01/07/2013   Procedure: CYSTOSCOPY WITH URETHRAL DILATATION BALLOON DILATION ;  Surgeon: Bernestine Amass, MD;  Location: [facility];  Service: Urology;  Laterality: N/A;  . LUMBAR LAMINECTOMY/DECOMPRESSION MICRODISCECTOMY Right 11/29/2012   Procedure: Right Lumbar Four to Five Laminectomy/ Decompression Microdiskectomy;  Surgeon: Otilio Connors, MD;  Location: [facility];  Service: Neurosurgery;  Laterality: Right;  LUMBAR LAMINECTOMY/DECOMPRESSION MICRODISCECTOMY 1 LEVEL  . POLYPECTOMY    . SHOULDER ARTHROSCOPY W/ ACROMIAL REPAIR Left 08-28-2002  . UPPER GASTROINTESTINAL ENDOSCOPY     Social History   Occupational History  . Occupation: retired  Tobacco Use  . Smoking status: Former Smoker    Packs/day: 0.50    Years: 22.00    Pack years: 11.00    Types: Cigarettes    Quit date: 06/27/1988    Years since quitting: 30.6  . Smokeless tobacco: Never Used  Substance and Sexual Activity  . Alcohol use: Yes    Alcohol/week: 3.0 standard drinks    Types: 3 Standard drinks or equivalent per week    Comment: 4-5 drinks / day- cognac   . Drug use: No  . Sexual activity: Not on file

## 2019-02-20 NOTE — Telephone Encounter (Signed)
Please get patient approved for Durolane injection for the left knee

## 2019-02-25 ENCOUNTER — Ambulatory Visit (INDEPENDENT_AMBULATORY_CARE_PROVIDER_SITE_OTHER): Payer: Medicare PPO | Admitting: Family Medicine

## 2019-02-25 ENCOUNTER — Encounter: Payer: Self-pay | Admitting: Family Medicine

## 2019-02-25 ENCOUNTER — Other Ambulatory Visit: Payer: Self-pay

## 2019-02-25 VITALS — BP 140/78 | HR 71 | Temp 98.7°F | Ht 67.0 in | Wt 224.2 lb

## 2019-02-25 DIAGNOSIS — S50821A Blister (nonthermal) of right forearm, initial encounter: Secondary | ICD-10-CM

## 2019-02-25 DIAGNOSIS — L237 Allergic contact dermatitis due to plants, except food: Secondary | ICD-10-CM | POA: Diagnosis not present

## 2019-02-25 MED ORDER — PREDNISONE 20 MG PO TABS: ORAL_TABLET | ORAL | 0 refills | Status: DC

## 2019-02-25 NOTE — Patient Instructions (Addendum)
Start prednisone due to degree of poison ivy.  As we discussed watch for any side effects on that medication.  Keep areas clean and covered, and keep blisters clean and covered.  If those rupture, be sure to wash with soap and water and apply bandage over area, watch for secondary signs of infection.  Return to the clinic or go to the nearest emergency room if any of your symptoms worsen or new symptoms occur.   Blisters, Adult  A blister is a raised bubble of skin filled with liquid. Blisters often develop in an area of the skin that repeatedly rubs or presses against another surface (friction blister). Friction blisters can occur on any part of the body, but they usually develop on the hands or feet. Long-term pressure on the same area of the skin can also lead to areas of hardened skin (calluses). What are the causes? A blister can be caused by:  An injury.  A burn.  An allergic reaction.  An infection.  Exposure to irritating chemicals.  Friction, especially in an area with a lot of heat and moisture. Friction blisters often result from:  Sports.  Repetitive activities.  Using tools and doing other activities without wearing gloves.  Shoes that are too tight or too loose. What are the signs or symptoms? A blister is often round and looks like a bump. It may:  Itch.  Be painful to the touch. Before a blister forms, the skin may:  Become red.  Feel warm.  Itch.  Be painful to the touch. How is this diagnosed? A blister is diagnosed with a physical exam. How is this treated? Treatment usually involves protecting the area where the blister has formed until the skin has healed. Other treatments may include:  A bandage (dressing) to cover the blister.  Extra padding around and over the blister, so that it does not rub on anything.  Antibiotic ointment. Most blisters break open, dry up, and go away on their own within 1-2 weeks. Blisters that are very painful may  be drained before they break open on their own. If the blister is large or painful, it can be drained by: 1. Sterilizing a small needle with rubbing alcohol. 2. Washing your hands with soap and water. 3. Inserting the needle in the edge of the blister to make a small hole. Some fluid will drain out of the hole. Let the top or roof of the blister stay in place. This helps the skin heal. 4. Washing the blister with mild soap and water. 5. Covering the blister with antibiotic ointment, if prescribed by your health care provider, and a dressing. Some blisters may need to be drained by a health care provider. Follow these instructions at home:  Protect the area where the blister has formed as told by your health care provider.  Keep your blister clean and dry. This helps to prevent infection.  Do not pop your blister. This can cause infection.  If you were prescribed an antibiotic, use it as told by your health care provider. Do not stop using the antibiotic even if your condition improves.  Wear different shoes until the blister heals.  Avoid the activity that caused the blister until your blister heals.  Check your blister every day for signs of infection. Check for: ? More redness, swelling, or pain. ? More fluid or blood. ? Warmth. ? Pus or a bad smell. ? The blister getting better and then getting worse. How is this prevented? Taking  these steps can help to prevent blisters that are caused by friction. Make sure you:  Wear comfortable shoes that fit well.  Always wear socks with shoes.  Wear extra socks or use tape, bandages, or pads over blister-prone areas as needed. You may also apply petroleum jelly under bandages in blister-prone areas.  Wear protective gear, such as gloves, when participating in sports or activities that can cause blisters.  Wear loose-fitting, moisture-wicking clothes when participating in sports or activities.  Use powders as needed to keep your feet  dry. Contact a health care provider if:  You have more redness, swelling, or pain around your blister.  You have more fluid or blood coming from your blister.  Your blister feels warm to the touch.  You have pus or a bad smell coming from your blister.  You have a fever or chills.  Your blister gets better and then it gets worse. This information is not intended to replace advice given to you by your health care provider. Make sure you discuss any questions you have with your health care provider. Document Released: 07/21/2004 Document Revised: 05/26/2017 Document Reviewed: 12/25/2015 Elsevier Patient Education  2020 [place] Dermatitis Poison ivy dermatitis is inflammation of the skin that is caused by chemicals in the leaves of the poison ivy plant. The skin reaction often involves redness, swelling, blisters, and extreme itching. What are the causes? This condition is caused by a chemical (urushiol) found in the sap of the poison ivy plant. This chemical is sticky and can be easily spread to people, animals, and objects. You can get poison ivy dermatitis by:  Having direct contact with a poison ivy plant.  Touching animals, other people, or objects that have come in contact with poison ivy and have the chemical on them. What increases the risk? This condition is more likely to develop in people who:  Are outdoors often in wooded or [place] areas.  Go outdoors without wearing protective clothing, such as closed shoes, long pants, and a long-sleeved shirt. What are the signs or symptoms? Symptoms of this condition include:  Redness of the skin.  Extreme itching.  A rash that often includes bumps and blisters. The rash usually appears 48 hours after exposure, if you have been exposed before. If this is the first time you have been exposed, the rash may not appear until a week after exposure.  Swelling. This may occur if the reaction is more severe. Symptoms  usually last for 1-2 weeks. However, the first time you develop this condition, symptoms may last 3-4 weeks. How is this diagnosed? This condition may be diagnosed based on your symptoms and a physical exam. Your health care provider may also ask you about any recent outdoor activity. How is this treated? Treatment for this condition will vary depending on how severe it is. Treatment may include:  Hydrocortisone cream or calamine lotion to relieve itching.  Oatmeal baths to soothe the skin.  Medicines, such as over-the-counter antihistamine tablets.  Oral steroid medicine, for more severe reactions. Follow these instructions at home: Medicines  Take or apply over-the-counter and prescription medicines only as told by your health care provider.  Use hydrocortisone cream or calamine lotion as needed to soothe the skin and relieve itching. General instructions  Do not scratch or rub your skin.  Apply a cold, wet cloth (cold compress) to the affected areas or take baths in cool water. This will help with itching. Avoid hot baths and showers.  Take oatmeal baths as needed. Use colloidal oatmeal. You can get this at your local pharmacy or grocery store. Follow the instructions on the packaging.  While you have the rash, wash clothes right after you wear them.  Keep all follow-up visits as told by your health care provider. This is important. How is this prevented?   Learn to identify the poison ivy plant and avoid contact with the plant. This plant can be recognized by the number of leaves. Generally, poison ivy has three leaves with flowering branches on a single stem. The leaves are typically glossy, and they have jagged edges that come to a point at the front.  If you have been exposed to poison ivy, thoroughly wash with soap and water right away. You have about 30 minutes to remove the plant resin before it will cause the rash. Be sure to wash under your fingernails, because any  plant resin there will continue to spread the rash.  When hiking or camping, wear clothes that will help you to avoid exposure on the skin. This includes long pants, a long-sleeved shirt, tall socks, and hiking boots. You can also apply preventive lotion to your skin to help limit exposure.  If you suspect that your clothes or outdoor gear came in contact with poison ivy, rinse them off outside with a garden hose before you bring them inside your house.  When doing yard work or gardening, wear gloves, long sleeves, long pants, and boots. Wash your garden tools and gloves if they come in contact with poison ivy.  If you suspect that your pet has come into contact with poison ivy, wash him or her with pet shampoo and water. Make sure to wear gloves while washing your pet. Contact a health care provider if you have:  Open sores in the rash area.  More redness, swelling, or pain in the affected area.  Redness that spreads beyond the rash area.  Fluid, blood, or pus coming from the affected area.  A fever.  A rash over a large area of your body.  A rash on your eyes, mouth, or genitals.  A rash that does not improve after a few weeks. Get help right away if:  Your face swells or your eyes swell shut.  You have trouble breathing.  You have trouble swallowing. These symptoms may represent a serious problem that is an emergency. Do not wait to see if the symptoms will go away. Get medical help right away. Call your local emergency services (911 in the [place] Do not drive yourself to the hospital. Summary  Poison ivy dermatitis is inflammation of the skin that is caused by chemicals in the leaves of the poison ivy plant.  Symptoms of this condition include redness, itching, a rash, and swelling.  Do not scratch or rub your skin.  Take or apply over-the-counter and prescription medicines only as told by your health care provider. This information is not intended to replace advice  given to you by your health care provider. Make sure you discuss any questions you have with your health care provider. Document Released: 06/10/2000 Document Revised: 10/05/2018 Document Reviewed: 06/08/2018 Elsevier Patient Education  [employer].     If you have lab work done today you will be contacted with your lab results within the next 2 weeks.  If you have not heard from [place] then please contact us. The fastest way to get your results is to register for My Chart.   IF you  received an x-ray today, you will receive an invoice from [facility]. Please contact [facility] at [phone] with questions or concerns regarding your invoice.   IF you received labwork today, you will receive an invoice from [place]. Please contact [facility] at [phone] with questions or concerns regarding your invoice.   Our billing staff will not be able to assist you with questions regarding bills from these companies.  You will be contacted with the lab results as soon as they are available. The fastest way to get your results is to activate your My Chart account. Instructions are located on the last page of this paperwork. If you have not heard from [place] regarding the results in 2 weeks, please contact this office.

## 2019-02-25 NOTE — Progress Notes (Signed)
Subjective:    Patient ID: Douglas Carpenter, male    DOB: [birth year], 63 y.o.   MRN: [id number]  HPI [name] is a 63 y.o. male Presents today for: Chief Complaint  Patient presents with  . [place] Ivy/ Oak    All over both arms and legs, worse on right forarm. (blister)   Contact dermatitis/suspected poison ivy or oak. Treated July 6 with prednisone taper at that time after previous treatment in May.  Symptoms in July have been present for 2 weeks. Current symptoms started 3 days ago - both arms, itching, blistering. Then went to legs. Not in mouth/gentials, no neck/face involvement.  Does mow grass, but did not touch waste that he knows of - wearing long sleeves and pants.   Tx: triamcinolone cream - BID. Calamine lotion helps some.   Has had ivy pulled from his yard, but some in neighbors yard, and gets into it with mowing side of his yard.   Prednisone tolerated prior.  glucose 99 in April.   Patient Active Problem List   Diagnosis Date Noted  . Primary osteoarthritis of right knee 02/20/2019  . Chronic pain of left knee 12/05/2018  . Exercise-induced asthma 07/21/2014  . Chronic cough 07/21/2014  . Upper airway cough syndrome 07/21/2014  . Allergic rhinitis 07/21/2014  . Lumbar stenosis 11/06/2013  . Urethral stricture 01/07/2013  . Prostatic hypertrophy 11/12/2012  . VOCAL CORD DISORDER 02/19/2009  . HYPERLIPIDEMIA 01/27/2009  . Snoring 01/27/2009  . HYPERTENSION 01/27/2009  . CHRONIC RHINITIS 01/27/2009  . Asthma 01/27/2009  . GERD 01/27/2009   Past Medical History:  Diagnosis Date  . Allergy   . Arthritis    "everywhere"  . BPH (benign prostatic hypertrophy)   . Chronic rhinitis 01/27/2009  . ED (erectile dysfunction)   . GERD (gastroesophageal reflux disease)   . Headache(784.0)    migraines, as many as 3 in a week    . HYPERLIPIDEMIA 01/27/2009  . HYPERTENSION 01/27/2009  . Mild asthma    seasonal allergies, uses Proair once per yr.   . Mild obstructive  sleep apnea    per pt -- mild osa test result--  no rx cpap needed  . Neuromuscular disorder ([place])   . Sleep apnea    no cpap  . Testosterone deficiency   . Urethral stricture   . Vocal cord anomaly    pt states told from [place]  doctor heard a little gurgle sound -- no farther testing done   Past Surgical History:  Procedure Laterality Date  . COLONOSCOPY    . CYSTOSCOPY WITH URETHRAL DILATATION N/A 01/07/2013   Procedure: CYSTOSCOPY WITH URETHRAL DILATATION BALLOON DILATION ;  Surgeon: Bernestine Amass, MD;  Location: [facility];  Service: Urology;  Laterality: N/A;  . LUMBAR LAMINECTOMY/DECOMPRESSION MICRODISCECTOMY Right 11/29/2012   Procedure: Right Lumbar Four to Five Laminectomy/ Decompression Microdiskectomy;  Surgeon: Otilio Connors, MD;  Location: [facility];  Service: Neurosurgery;  Laterality: Right;  LUMBAR LAMINECTOMY/DECOMPRESSION MICRODISCECTOMY 1 LEVEL  . POLYPECTOMY    . SHOULDER ARTHROSCOPY W/ ACROMIAL REPAIR Left 08-28-2002  . UPPER GASTROINTESTINAL ENDOSCOPY     Allergies  Allergen Reactions  . Aspirin Rash    Pt can tolerate low doses-- INTOLERANT TO HIGHER DOSES   Prior to Admission medications   Medication Sig Start Date End Date Taking? Authorizing Provider  albuterol (PROAIR HFA) 108 (90 Base) MCG/ACT inhaler Inhale 2 puffs into the lungs every 6 (six) hours as needed for  wheezing or shortness of breath. 04/18/18  Yes [name], MD  allopurinol (ZYLOPRIM) 300 MG tablet Take 1 tablet (300 mg total) by mouth daily. 12/05/18  Yes Aundra Dubin, PA-C  aspirin EC 81 MG tablet Take 81 mg by mouth every morning.   Yes [provider]  Aspirin-Acetaminophen-Caffeine (EXCEDRIN PO) Take 2 tablets by mouth 2 (two) times daily as needed (migraine).   Yes [provider]  atenolol (TENORMIN) 25 MG tablet Take 25 mg by mouth every morning.    Yes [provider]  atorvastatin (LIPITOR) 80 MG tablet Take 40 mg by mouth at bedtime.    Yes [provider]  cyclobenzaprine (FLEXERIL) 10 MG tablet Take 0.5-1 tablets (5-10 mg total) by mouth 3 (three) times daily as needed for muscle spasms. 11/08/13  Yes Jovita Gamma, MD  diclofenac (VOLTAREN) 75 MG EC tablet Take 75 mg by mouth 2 (two) times daily.    Yes [provider]  DULoxetine (CYMBALTA) 60 MG capsule Take 1 capsule (60 mg total) by mouth daily. 11/13/17  Yes Wendie Agreste, MD  fluticasone-salmeterol (ADVAIR HFA) [phone] MCG/ACT inhaler Inhale 2 puffs into the lungs 2 (two) times daily. 04/18/18  Yes [name], MD  Ketotifen Fumarate (THERA TEARS ALLERGY OP) Place 1 drop into both eyes daily as needed (dry eyes).   Yes [provider]  montelukast (SINGULAIR) 10 MG tablet Take 1 tablet (10 mg total) by mouth at bedtime. 04/03/17  Yes [name], MD  olopatadine (PATADAY) 0.1 % ophthalmic solution Place 1 drop into both eyes 2 (two) times daily. 10/15/18  Yes Yu, Amy V, PA-C  Omega-3 Fatty Acids (FISH OIL) 1000 MG CPDR Take 2,000 mg by mouth 2 (two) times daily.     Yes [provider]  omeprazole (PRILOSEC) 20 MG capsule Take 20 mg by mouth daily.    Yes [provider]  predniSONE (DELTASONE) 20 MG tablet 3 by mouth for 3 days, then 2 by mouth for 2 days, then 1 by mouth for 2 days, then 1/2 by mouth for 2 days. 12/31/18  Yes Wendie Agreste, MD  topiramate (TOPAMAX) 25 MG tablet Take 25 mg by mouth daily.    Yes [provider]  triamcinolone cream (KENALOG) 0.1 % Apply 1 application topically 3 (three) times daily. As needed to hand. 11/20/18  Yes Wendie Agreste, MD   Social History   Socioeconomic History  . Marital status: Married    Spouse name: Not on file  . Number of children: Not on file  . Years of education: Not on file  . Highest education level: Not on file  Occupational History  . Occupation: retired  Scientific laboratory technician  . Financial resource strain: Not on file  . Food insecurity    Worry: Not  on file    Inability: Not on file  . Transportation needs    Medical: Not on file    Non-medical: Not on file  Tobacco Use  . Smoking status: Former Smoker    Packs/day: 0.50    Years: 22.00    Pack years: 11.00    Types: Cigarettes    Quit date: 06/27/1988    Years since quitting: 30.6  . Smokeless tobacco: Never Used  Substance and Sexual Activity  . Alcohol use: Yes    Alcohol/week: 3.0 standard drinks    Types: 3 Standard drinks or equivalent per week    Comment: 4-5 drinks / day- cognac   . Drug  use: No  . Sexual activity: Not on file  Lifestyle  . Physical activity    Days per week: Not on file    Minutes per session: Not on file  . Stress: Not on file  Relationships  . Social Herbalist on phone: Not on file    Gets together: Not on file    Attends religious service: Not on file    Active member of club or organization: Not on file    Attends meetings of clubs or organizations: Not on file    Relationship status: Not on file  . Intimate partner violence    Fear of current or ex partner: Not on file    Emotionally abused: Not on file    Physically abused: Not on file    Forced sexual activity: Not on file  Other Topics Concern  . Not on file  Social History Narrative  . Not on file    Review of Systems Per HPI.     Objective:   Physical Exam Constitutional:      General: He is not in acute distress.    Appearance: He is well-developed.  HENT:     Head: Normocephalic and atraumatic.  Cardiovascular:     Rate and Rhythm: Normal rate.  Pulmonary:     Effort: Pulmonary effort is normal.  Skin:    Findings: Rash present. Rash is vesicular.          Comments: Diffuse linear rash across bilateral forearms, upper arm.  2 bullae R wrist/forearm, clear-yellow fluid inside on forearm. No d/c. No surrounding erythema.   Neurological:     Mental Status: He is alert and oriented to person, place, and time.    Vitals:   02/25/19 1158  BP: 140/78   Pulse: 71  Temp: 98.7 F (37.1 C)  TempSrc: Oral  SpO2: 97%  Weight: 224 lb 3.2 oz (101.7 kg)  Height: 5\' 7"  (1.702 m)      Assessment & Plan:    LANNY VONWALD is a 63 y.o. male Blister of right forearm, initial encounter - Plan: predniSONE (DELTASONE) 20 MG tablet  Contact dermatitis due to poison ivy - Plan: predniSONE (DELTASONE) 20 MG tablet  Diffuse rash with linear apprearance, small vesicles likely contact derm with poison ivy. Diffuse involvement, s/p trial of topical treatments.   - prednisone taper - potential risks and side effects discussed.   - blister care reviewed and RTC precautions given. telfa and gauze wrap loosely placed over lesions.   - rtc precautions.   Meds ordered this encounter  Medications  . predniSONE (DELTASONE) 20 MG tablet    Sig: 3 by mouth for 3 days, then 2 by mouth for 2 days, then 1 by mouth for 2 days, then 1/2 by mouth for 2 days.    Dispense:  16 tablet    Refill:  0   Patient Instructions   Start prednisone due to degree of poison ivy.  As we discussed watch for any side effects on that medication.  Keep areas clean and covered, and keep blisters clean and covered.  If those rupture, be sure to wash with soap and water and apply bandage over area, watch for secondary signs of infection.  Return to the clinic or go to the nearest emergency room if any of your symptoms worsen or new symptoms occur.   Blisters, Adult  A blister is a raised bubble of skin filled with liquid. Blisters often develop in  an area of the skin that repeatedly rubs or presses against another surface (friction blister). Friction blisters can occur on any part of the body, but they usually develop on the hands or feet. Long-term pressure on the same area of the skin can also lead to areas of hardened skin (calluses). What are the causes? A blister can be caused by:  An injury.  A burn.  An allergic reaction.  An infection.  Exposure to irritating  chemicals.  Friction, especially in an area with a lot of heat and moisture. Friction blisters often result from:  Sports.  Repetitive activities.  Using tools and doing other activities without wearing gloves.  Shoes that are too tight or too loose. What are the signs or symptoms? A blister is often round and looks like a bump. It may:  Itch.  Be painful to the touch. Before a blister forms, the skin may:  Become red.  Feel warm.  Itch.  Be painful to the touch. How is this diagnosed? A blister is diagnosed with a physical exam. How is this treated? Treatment usually involves protecting the area where the blister has formed until the skin has healed. Other treatments may include:  A bandage (dressing) to cover the blister.  Extra padding around and over the blister, so that it does not rub on anything.  Antibiotic ointment. Most blisters break open, dry up, and go away on their own within 1-2 weeks. Blisters that are very painful may be drained before they break open on their own. If the blister is large or painful, it can be drained by: 1. Sterilizing a small needle with rubbing alcohol. 2. Washing your hands with soap and water. 3. Inserting the needle in the edge of the blister to make a small hole. Some fluid will drain out of the hole. Let the top or roof of the blister stay in place. This helps the skin heal. 4. Washing the blister with mild soap and water. 5. Covering the blister with antibiotic ointment, if prescribed by your health care provider, and a dressing. Some blisters may need to be drained by a health care provider. Follow these instructions at home:  Protect the area where the blister has formed as told by your health care provider.  Keep your blister clean and dry. This helps to prevent infection.  Do not pop your blister. This can cause infection.  If you were prescribed an antibiotic, use it as told by your health care provider. Do not stop  using the antibiotic even if your condition improves.  Wear different shoes until the blister heals.  Avoid the activity that caused the blister until your blister heals.  Check your blister every day for signs of infection. Check for: ? More redness, swelling, or pain. ? More fluid or blood. ? Warmth. ? Pus or a bad smell. ? The blister getting better and then getting worse. How is this prevented? Taking these steps can help to prevent blisters that are caused by friction. Make sure you:  Wear comfortable shoes that fit well.  Always wear socks with shoes.  Wear extra socks or use tape, bandages, or pads over blister-prone areas as needed. You may also apply petroleum jelly under bandages in blister-prone areas.  Wear protective gear, such as gloves, when participating in sports or activities that can cause blisters.  Wear loose-fitting, moisture-wicking clothes when participating in sports or activities.  Use powders as needed to keep your feet dry. Contact a health care  provider if:  You have more redness, swelling, or pain around your blister.  You have more fluid or blood coming from your blister.  Your blister feels warm to the touch.  You have pus or a bad smell coming from your blister.  You have a fever or chills.  Your blister gets better and then it gets worse. This information is not intended to replace advice given to you by your health care provider. Make sure you discuss any questions you have with your health care provider. Document Released: 07/21/2004 Document Revised: 05/26/2017 Document Reviewed: 12/25/2015 Elsevier Patient Education  2020 [place] Dermatitis Poison ivy dermatitis is inflammation of the skin that is caused by chemicals in the leaves of the poison ivy plant. The skin reaction often involves redness, swelling, blisters, and extreme itching. What are the causes? This condition is caused by a chemical (urushiol) found in  the sap of the poison ivy plant. This chemical is sticky and can be easily spread to people, animals, and objects. You can get poison ivy dermatitis by:  Having direct contact with a poison ivy plant.  Touching animals, other people, or objects that have come in contact with poison ivy and have the chemical on them. What increases the risk? This condition is more likely to develop in people who:  Are outdoors often in wooded or [place] areas.  Go outdoors without wearing protective clothing, such as closed shoes, long pants, and a long-sleeved shirt. What are the signs or symptoms? Symptoms of this condition include:  Redness of the skin.  Extreme itching.  A rash that often includes bumps and blisters. The rash usually appears 48 hours after exposure, if you have been exposed before. If this is the first time you have been exposed, the rash may not appear until a week after exposure.  Swelling. This may occur if the reaction is more severe. Symptoms usually last for 1-2 weeks. However, the first time you develop this condition, symptoms may last 3-4 weeks. How is this diagnosed? This condition may be diagnosed based on your symptoms and a physical exam. Your health care provider may also ask you about any recent outdoor activity. How is this treated? Treatment for this condition will vary depending on how severe it is. Treatment may include:  Hydrocortisone cream or calamine lotion to relieve itching.  Oatmeal baths to soothe the skin.  Medicines, such as over-the-counter antihistamine tablets.  Oral steroid medicine, for more severe reactions. Follow these instructions at home: Medicines  Take or apply over-the-counter and prescription medicines only as told by your health care provider.  Use hydrocortisone cream or calamine lotion as needed to soothe the skin and relieve itching. General instructions  Do not scratch or rub your skin.  Apply a cold, wet cloth (cold  compress) to the affected areas or take baths in cool water. This will help with itching. Avoid hot baths and showers.  Take oatmeal baths as needed. Use colloidal oatmeal. You can get this at your local pharmacy or grocery store. Follow the instructions on the packaging.  While you have the rash, wash clothes right after you wear them.  Keep all follow-up visits as told by your health care provider. This is important. How is this prevented?   Learn to identify the poison ivy plant and avoid contact with the plant. This plant can be recognized by the number of leaves. Generally, poison ivy has three leaves with flowering branches on a single stem.  The leaves are typically glossy, and they have jagged edges that come to a point at the front.  If you have been exposed to poison ivy, thoroughly wash with soap and water right away. You have about 30 minutes to remove the plant resin before it will cause the rash. Be sure to wash under your fingernails, because any plant resin there will continue to spread the rash.  When hiking or camping, wear clothes that will help you to avoid exposure on the skin. This includes long pants, a long-sleeved shirt, tall socks, and hiking boots. You can also apply preventive lotion to your skin to help limit exposure.  If you suspect that your clothes or outdoor gear came in contact with poison ivy, rinse them off outside with a garden hose before you bring them inside your house.  When doing yard work or gardening, wear gloves, long sleeves, long pants, and boots. Wash your garden tools and gloves if they come in contact with poison ivy.  If you suspect that your pet has come into contact with poison ivy, wash him or her with pet shampoo and water. Make sure to wear gloves while washing your pet. Contact a health care provider if you have:  Open sores in the rash area.  More redness, swelling, or pain in the affected area.  Redness that spreads beyond the rash  area.  Fluid, blood, or pus coming from the affected area.  A fever.  A rash over a large area of your body.  A rash on your eyes, mouth, or genitals.  A rash that does not improve after a few weeks. Get help right away if:  Your face swells or your eyes swell shut.  You have trouble breathing.  You have trouble swallowing. These symptoms may represent a serious problem that is an emergency. Do not wait to see if the symptoms will go away. Get medical help right away. Call your local emergency services (911 in the [place] Do not drive yourself to the hospital. Summary  Poison ivy dermatitis is inflammation of the skin that is caused by chemicals in the leaves of the poison ivy plant.  Symptoms of this condition include redness, itching, a rash, and swelling.  Do not scratch or rub your skin.  Take or apply over-the-counter and prescription medicines only as told by your health care provider. This information is not intended to replace advice given to you by your health care provider. Make sure you discuss any questions you have with your health care provider. Document Released: 06/10/2000 Document Revised: 10/05/2018 Document Reviewed: 06/08/2018 Elsevier Patient Education  [employer].     If you have lab work done today you will be contacted with your lab results within the next 2 weeks.  If you have not heard from [place] then please contact us. The fastest way to get your results is to register for My Chart.   IF you received an x-ray today, you will receive an invoice from [facility]. Please contact [facility] at [phone] with questions or concerns regarding your invoice.   IF you received labwork today, you will receive an invoice from [facility]. Please contact [facility] at [phone] with questions or concerns regarding your invoice.   Our billing staff will not be able to assist you with questions regarding bills from these companies.  You  will be contacted with the lab results as soon as they are available. The fastest way to get your results is to activate  your My Chart account. Instructions are located on the last page of this paperwork. If you have not heard from [place] regarding the results in 2 weeks, please contact this office.       Signed,   Merri Ray, MD [facility].  02/25/19 1:42 PM

## 2019-02-27 ENCOUNTER — Telehealth: Payer: Self-pay | Admitting: Family Medicine

## 2019-02-27 NOTE — Telephone Encounter (Signed)
Spoke with pt advised to keep blister clean with water and use neosporin and keep covered to keep bacteria and debre from wound and cover, then uncover at night so wound can breathe.  Pt denies swelling, blood in drainage or fever.  Pt appreciates call back. Dgaddy, CMA

## 2019-02-27 NOTE — Telephone Encounter (Signed)
Pt was seen on 02/25/19 for poison ivy. The blister has now burst. He would like a cb concerning this issue. He would like to know what to do. Please advise at  [phone]

## 2019-04-03 ENCOUNTER — Ambulatory Visit: Payer: Medicare PPO | Admitting: Orthopaedic Surgery

## 2019-04-05 ENCOUNTER — Ambulatory Visit: Payer: Self-pay

## 2019-04-05 ENCOUNTER — Encounter: Payer: Self-pay | Admitting: Orthopaedic Surgery

## 2019-04-05 ENCOUNTER — Telehealth: Payer: Self-pay

## 2019-04-05 ENCOUNTER — Ambulatory Visit (INDEPENDENT_AMBULATORY_CARE_PROVIDER_SITE_OTHER): Payer: Medicare PPO

## 2019-04-05 ENCOUNTER — Other Ambulatory Visit: Payer: Self-pay

## 2019-04-05 ENCOUNTER — Ambulatory Visit (INDEPENDENT_AMBULATORY_CARE_PROVIDER_SITE_OTHER): Payer: Medicare PPO | Admitting: Orthopaedic Surgery

## 2019-04-05 DIAGNOSIS — M1711 Unilateral primary osteoarthritis, right knee: Secondary | ICD-10-CM | POA: Diagnosis not present

## 2019-04-05 DIAGNOSIS — M170 Bilateral primary osteoarthritis of knee: Secondary | ICD-10-CM

## 2019-04-05 DIAGNOSIS — M1712 Unilateral primary osteoarthritis, left knee: Secondary | ICD-10-CM

## 2019-04-05 MED ORDER — METHYLPREDNISOLONE ACETATE 40 MG/ML IJ SUSP
40.0000 mg | INTRAMUSCULAR | Status: AC | PRN
  Administered 2019-04-05: 40 mg via INTRA_ARTICULAR

## 2019-04-05 MED ORDER — LIDOCAINE HCL 1 % IJ SOLN
2.0000 mL | INTRAMUSCULAR | Status: AC | PRN
  Administered 2019-04-05: 2 mL

## 2019-04-05 MED ORDER — BUPIVACAINE HCL 0.25 % IJ SOLN
2.0000 mL | INTRAMUSCULAR | Status: AC | PRN
  Administered 2019-04-05: 2 mL via INTRA_ARTICULAR

## 2019-04-05 NOTE — Telephone Encounter (Signed)
Please submit for left knee gel inj- Dr. Xu.  

## 2019-04-05 NOTE — Progress Notes (Signed)
Office Visit Note   Patient: Douglas Carpenter           Date of Birth: Jun 06, 1956           MRN: [id number] Visit Date: 04/05/2019              Requested by: Wendie Agreste, MD [address] [place],  [place] [address] PCP: Wendie Agreste, MD   Assessment & Plan: Visit Diagnoses:  1. Bilateral primary osteoarthritis of knee     Plan: Impression is bilateral knee osteoarthritis.  We will inject the right knee with cortisone today.  We will go ahead and get approval for Visco supplementation injection to the left knee.  He will follow-up with [place] once he has been approved.  He will call with concerns or questions the meantime.  Follow-Up Instructions: Return for once approved for viscosupplementation injection right knee.   Orders:  Orders Placed This Encounter  Procedures  . Large Joint Inj: R knee  . XR Knee Complete 4 Views Right  . XR Knee 1-2 Views Left   No orders of the defined types were placed in this encounter.     Procedures: Large Joint Inj: R knee on 04/05/2019 9:24 AM Indications: pain Details: 22 G needle, anterolateral approach Medications: 2 mL bupivacaine 0.25 %; 2 mL lidocaine 1 %; 40 mg methylPREDNISolone acetate 40 MG/ML      Clinical Data: No additional findings.   Subjective: Chief Complaint  Patient presents with  . Left Knee - Pain  . Right Knee - Pain    HPI patient is a pleasant 63 year old gentleman who presents our clinic today with bilateral knee pain left greater than right.  History of left knee pseudogout and osteoarthritis.  He was seen in our office for his left knee in April where his knee was aspirated and injected.  This showed pseudogout.  He was seen again in August of this year where his left knee was reinjected.  He noted approximately 2 to 3 weeks relief.  His pain has since returned.  He is also having pain to the right knee as well.  The pains he has is to the medial aspect.  He has some associated weakness.  No  mechanical symptoms.  His pain is significantly worse when he goes from a seated to standing position.  He does get some relief after he has been walking for a few steps.  He does occasionally have night pain as well.  He has been taking Norco as needed which has been given to him by his primary care provider.  No previous viscosupplementation injection to either knee.  No previous right knee cortisone injection.  Review of Systems as detailed in HPI.  All others reviewed and are negative.   Objective: Vital Signs: There were no vitals taken for this visit.  Physical Exam well-developed and well-nourished gentleman in no acute distress.  Alert and oriented x3.  Ortho Exam examination of the left knee reveals a trace effusion.  Right knee has no effusion.  Range of motion of both knees is from 0 to 115 degrees.  Medial joint line tenderness.  Moderate patellofemoral crepitus right greater than left.  Stable valgus varus stress.  He is neurovascular intact distally.  Specialty Comments:  No specialty comments available.  Imaging: Xr Knee 1-2 Views Left  Result Date: 04/05/2019 Moderate medial and patellofemoral degenerative changes  Xr Knee Complete 4 Views Right  Result Date: 04/05/2019 Moderate medial and patellofemoral degenerative  changes    PMFS History: Patient Active Problem List   Diagnosis Date Noted  . Primary osteoarthritis of right knee 02/20/2019  . Chronic pain of left knee 12/05/2018  . Exercise-induced asthma 07/21/2014  . Chronic cough 07/21/2014  . Upper airway cough syndrome 07/21/2014  . Allergic rhinitis 07/21/2014  . Lumbar stenosis 11/06/2013  . Urethral stricture 01/07/2013  . Prostatic hypertrophy 11/12/2012  . VOCAL CORD DISORDER 02/19/2009  . HYPERLIPIDEMIA 01/27/2009  . Snoring 01/27/2009  . HYPERTENSION 01/27/2009  . CHRONIC RHINITIS 01/27/2009  . Asthma 01/27/2009  . GERD 01/27/2009   Past Medical History:  Diagnosis Date  . Allergy   .  Arthritis    "everywhere"  . BPH (benign prostatic hypertrophy)   . Chronic rhinitis 01/27/2009  . ED (erectile dysfunction)   . GERD (gastroesophageal reflux disease)   . Headache(784.0)    migraines, as many as 3 in a week    . HYPERLIPIDEMIA 01/27/2009  . HYPERTENSION 01/27/2009  . Mild asthma    seasonal allergies, uses Proair once per yr.   . Mild obstructive sleep apnea    per pt -- mild osa test result--  no rx cpap needed  . Neuromuscular disorder ([place])   . Sleep apnea    no cpap  . Testosterone deficiency   . Urethral stricture   . Vocal cord anomaly    pt states told from [place]  doctor heard a little gurgle sound -- no farther testing done    Family History  Problem Relation Age of Onset  . Cancer Mother        lung  . Lung cancer Mother   . Heart disease Maternal Grandmother   . Heart disease Maternal Grandfather   . Cancer Maternal Grandfather        colon, prostate,lung  . Colon cancer Neg Hx   . Colon polyps Neg Hx   . Esophageal cancer Neg Hx   . Rectal cancer Neg Hx   . Stomach cancer Neg Hx     Past Surgical History:  Procedure Laterality Date  . COLONOSCOPY    . CYSTOSCOPY WITH URETHRAL DILATATION N/A 01/07/2013   Procedure: CYSTOSCOPY WITH URETHRAL DILATATION BALLOON DILATION ;  Surgeon: Bernestine Amass, MD;  Location: [facility];  Service: Urology;  Laterality: N/A;  . LUMBAR LAMINECTOMY/DECOMPRESSION MICRODISCECTOMY Right 11/29/2012   Procedure: Right Lumbar Four to Five Laminectomy/ Decompression Microdiskectomy;  Surgeon: Otilio Connors, MD;  Location: [facility];  Service: Neurosurgery;  Laterality: Right;  LUMBAR LAMINECTOMY/DECOMPRESSION MICRODISCECTOMY 1 LEVEL  . POLYPECTOMY    . SHOULDER ARTHROSCOPY W/ ACROMIAL REPAIR Left 08-28-2002  . UPPER GASTROINTESTINAL ENDOSCOPY     Social History   Occupational History  . Occupation: retired  Tobacco Use  . Smoking status: Former Smoker    Packs/day: 0.50    Years: 22.00    Pack years:  11.00    Types: Cigarettes    Quit date: 06/27/1988    Years since quitting: 30.7  . Smokeless tobacco: Never Used  Substance and Sexual Activity  . Alcohol use: Yes    Alcohol/week: 3.0 standard drinks    Types: 3 Standard drinks or equivalent per week    Comment: 4-5 drinks / day- cognac   . Drug use: No  . Sexual activity: Not on file

## 2019-04-05 NOTE — Telephone Encounter (Signed)
Noted  

## 2019-04-10 ENCOUNTER — Telehealth: Payer: Self-pay

## 2019-04-10 NOTE — Telephone Encounter (Signed)
Submitted VOB for Monovisc, left knee. 

## 2019-04-11 ENCOUNTER — Ambulatory Visit (INDEPENDENT_AMBULATORY_CARE_PROVIDER_SITE_OTHER): Payer: Medicare PPO | Admitting: Pulmonary Disease

## 2019-04-11 ENCOUNTER — Other Ambulatory Visit: Payer: Self-pay

## 2019-04-11 ENCOUNTER — Encounter: Payer: Self-pay | Admitting: Pulmonary Disease

## 2019-04-11 VITALS — BP 128/88 | HR 78 | Temp 97.6°F | Ht 67.0 in | Wt 224.0 lb

## 2019-04-11 DIAGNOSIS — J4540 Moderate persistent asthma, uncomplicated: Secondary | ICD-10-CM | POA: Diagnosis not present

## 2019-04-11 DIAGNOSIS — J301 Allergic rhinitis due to pollen: Secondary | ICD-10-CM | POA: Diagnosis not present

## 2019-04-11 DIAGNOSIS — H1013 Acute atopic conjunctivitis, bilateral: Secondary | ICD-10-CM | POA: Diagnosis not present

## 2019-04-11 DIAGNOSIS — Z23 Encounter for immunization: Secondary | ICD-10-CM

## 2019-04-11 NOTE — Progress Notes (Signed)
[facility]  Chief Complaint  Patient presents with  . Moderate persistent extrinsic asthma without complication    Constitutional:  BP 128/88 (BP Location: Left Arm, Patient Position: Sitting, Cuff Size: Normal)   Pulse 78   Temp 97.6 F (36.4 C)   Ht 5\' 7"  (1.702 m)   Wt 224 lb (101.6 kg)   SpO2 97% Comment: on room air  BMI 35.08 kg/m   Past Medical History:  Low T, HTN, HLD, HA, GERD, BPH, OA  Brief Summary:  Douglas Carpenter is a 63 y.o. male former smoker with chronic cough in setting of Allergic asthma, Post nasal drip, GERD, and vocal cord disease. He also has hx of snoring.  He has been doing well.  Not having cough, chest congestion, sore throat, or sinus congestion.  Gets occasional wheeze, but better after using albuterol.  Was told he has pseudogout and seeing orthopedics for knee pain.   Physical Exam:   Appearance - well kempt   ENMT - clear nasal mucosa, midline nasal  septum, no oral exudates, no LAN, trachea midline  Respiratory - normal chest wall, normal respiratory effort, no accessory muscle use, no wheeze/rales  CV - s1s2 regular rate and rhythm, no murmurs, no peripheral edema, radial pulses symmetric  GI - soft, non tender, no masses  Lymph - no adenopathy noted in neck and axillary areas  MSK - normal gait  Ext - no cyanosis, clubbing, or joint inflammation noted  Skin - no rashes, lesions, or ulcers  Neuro - normal strength, oriented x 3  Psych - normal mood and affect   Assessment/Plan:   Allergic asthma with exercise induced bronchoconstriction. - continue adair, singulair, prn albuterol - gets meds from [place] - flu shot today  Upper airway cough syndrome. - singulair with prn claritin  Allergic conjunctivitis. - prn pataday   Patient Instructions  Follow up in 6 months    [name], MD [place] Pager: [phone] 04/11/2019, 10:07 AM  Flow Sheet     Pulmonary tests:  PFT 02/19/09 >> FEV1 2.90 (90%), FEV1% 70, TLC 5.62(93%), DLCO 99%, +BD PFT 07/21/14 >> FEV1 2.70 (93%), FEV1% 74, TLC 5.97 (91%), RV 2.65 (126%), DLCO 106%, +BD RAST 07/21/14 >> react to dust mites, grasses; IgE 49 FeNO 10/27/15 >> 228   Sleep tests:  HST 03/07/13 >> AHI 3.9, SaO2 low 80%  Medications:   Allergies as of 04/11/2019      Reactions   Aspirin Rash   Pt can tolerate low doses-- INTOLERANT TO HIGHER DOSES      Medication List       Accurate as of April 11, 2019 10:07 AM. If you have any questions, ask your nurse or doctor.        STOP taking these medications   predniSONE 20 MG tablet Commonly known as: DELTASONE Stopped by: [name], MD     TAKE these medications   albuterol 108 (90 Base) MCG/ACT inhaler Commonly known as: ProAir HFA Inhale 2 puffs into the lungs every 6 (six) hours as needed for wheezing or shortness of breath.   allopurinol 300 MG tablet Commonly known as: ZYLOPRIM Take 1 tablet (300 mg total) by mouth daily.   aspirin EC 81 MG tablet Take 81 mg by mouth every morning.   atenolol 25 MG tablet Commonly known as: TENORMIN Take 25 mg by mouth every morning.   atorvastatin 80 MG tablet Commonly known as: LIPITOR Take 40 mg  by mouth at bedtime.   cyclobenzaprine 10 MG tablet Commonly known as: FLEXERIL Take 0.5-1 tablets (5-10 mg total) by mouth 3 (three) times daily as needed for muscle spasms.   diclofenac 75 MG EC tablet Commonly known as: VOLTAREN Take 75 mg by mouth 2 (two) times daily.   DULoxetine 60 MG capsule Commonly known as: Cymbalta Take 1 capsule (60 mg total) by mouth daily.   EXCEDRIN PO Take 2 tablets by mouth 2 (two) times daily as needed (migraine).   Fish Oil 1000 MG Cpdr Take 2,000 mg by mouth 2 (two) times daily.   fluticasone-salmeterol 230-21 MCG/ACT inhaler Commonly known as: Advair HFA Inhale 2 puffs into the lungs 2 (two) times daily.   montelukast 10 MG tablet  Commonly known as: SINGULAIR Take 1 tablet (10 mg total) by mouth at bedtime.   olopatadine 0.1 % ophthalmic solution Commonly known as: Pataday Place 1 drop into both eyes 2 (two) times daily.   omeprazole 20 MG capsule Commonly known as: PRILOSEC Take 20 mg by mouth daily.   THERA TEARS ALLERGY OP Place 1 drop into both eyes daily as needed (dry eyes).   topiramate 25 MG tablet Commonly known as: TOPAMAX Take 25 mg by mouth daily.   triamcinolone cream 0.1 % Commonly known as: KENALOG Apply 1 application topically 3 (three) times daily. As needed to hand.       Past Surgical History:  He  has a past surgical history that includes Lumbar laminectomy/decompression microdiscectomy (Right, 11/29/2012); Shoulder arthroscopy w/ acromial repair (Left, 08-28-2002); Cystoscopy with urethral dilatation (N/A, 01/07/2013); Colonoscopy; Polypectomy; and Upper gastrointestinal endoscopy.  Family History:  His family history includes Cancer in his maternal grandfather and mother; Heart disease in his maternal grandfather and maternal grandmother; Lung cancer in his mother.  Social History:  He  reports that he quit smoking about 30 years ago. His smoking use included cigarettes. He has a 11.00 pack-year smoking history. He has never used smokeless tobacco. He reports current alcohol use of about 3.0 standard drinks of alcohol per week. He reports that he does not use drugs.

## 2019-04-11 NOTE — Patient Instructions (Signed)
Follow up in 6 months 

## 2019-04-12 ENCOUNTER — Telehealth: Payer: Self-pay

## 2019-04-12 NOTE — Telephone Encounter (Signed)
Patient is aware that he is approved for gel injection.  Approved for Monovisc, left knee. [place] does not apply Secondary insurance ([employer]) will pick up remaining eligible expenses at 100%. No Co-pay No PA required  Appt. 04/16/2019 with Dr. Erlinda Hong.

## 2019-04-16 ENCOUNTER — Ambulatory Visit (INDEPENDENT_AMBULATORY_CARE_PROVIDER_SITE_OTHER): Payer: Medicare PPO | Admitting: Orthopaedic Surgery

## 2019-04-16 ENCOUNTER — Other Ambulatory Visit: Payer: Self-pay

## 2019-04-16 ENCOUNTER — Encounter: Payer: Self-pay | Admitting: Orthopaedic Surgery

## 2019-04-16 DIAGNOSIS — M1712 Unilateral primary osteoarthritis, left knee: Secondary | ICD-10-CM | POA: Diagnosis not present

## 2019-04-16 MED ORDER — HYALURONAN 88 MG/4ML IX SOSY
88.0000 mg | PREFILLED_SYRINGE | INTRA_ARTICULAR | Status: AC | PRN
  Administered 2019-04-16: 88 mg via INTRA_ARTICULAR

## 2019-04-16 NOTE — Progress Notes (Signed)
   Procedure Note  Patient: Douglas Carpenter             Date of Birth: 02/24/1956           MRN: [id number]             Visit Date: 04/16/2019  Procedures: Visit Diagnoses:  1. Primary osteoarthritis of left knee     Large Joint Inj: L knee on 04/16/2019 10:03 AM Indications: pain Details: 22 G needle  Arthrogram: No  Medications: 88 mg Hyaluronan 88 MG/4ML Outcome: tolerated well, no immediate complications Patient was prepped and draped in the usual sterile fashion.

## 2019-04-23 DIAGNOSIS — M47816 Spondylosis without myelopathy or radiculopathy, lumbar region: Secondary | ICD-10-CM | POA: Diagnosis not present

## 2019-04-23 DIAGNOSIS — M5442 Lumbago with sciatica, left side: Secondary | ICD-10-CM | POA: Diagnosis not present

## 2019-04-23 DIAGNOSIS — M5136 Other intervertebral disc degeneration, lumbar region: Secondary | ICD-10-CM | POA: Diagnosis not present

## 2019-04-23 DIAGNOSIS — Q7649 Other congenital malformations of spine, not associated with scoliosis: Secondary | ICD-10-CM | POA: Diagnosis not present

## 2019-04-23 DIAGNOSIS — Z981 Arthrodesis status: Secondary | ICD-10-CM | POA: Diagnosis not present

## 2019-05-07 IMAGING — DX LEFT KNEE - COMPLETE 4+ VIEW
4 series · 4 of 4 positions shown · non-contrast
Comparison: None.

CLINICAL DATA: 62-year-old male with a history of left knee pain

EXAM:
LEFT KNEE - COMPLETE 4+ VIEW

[knee ap]
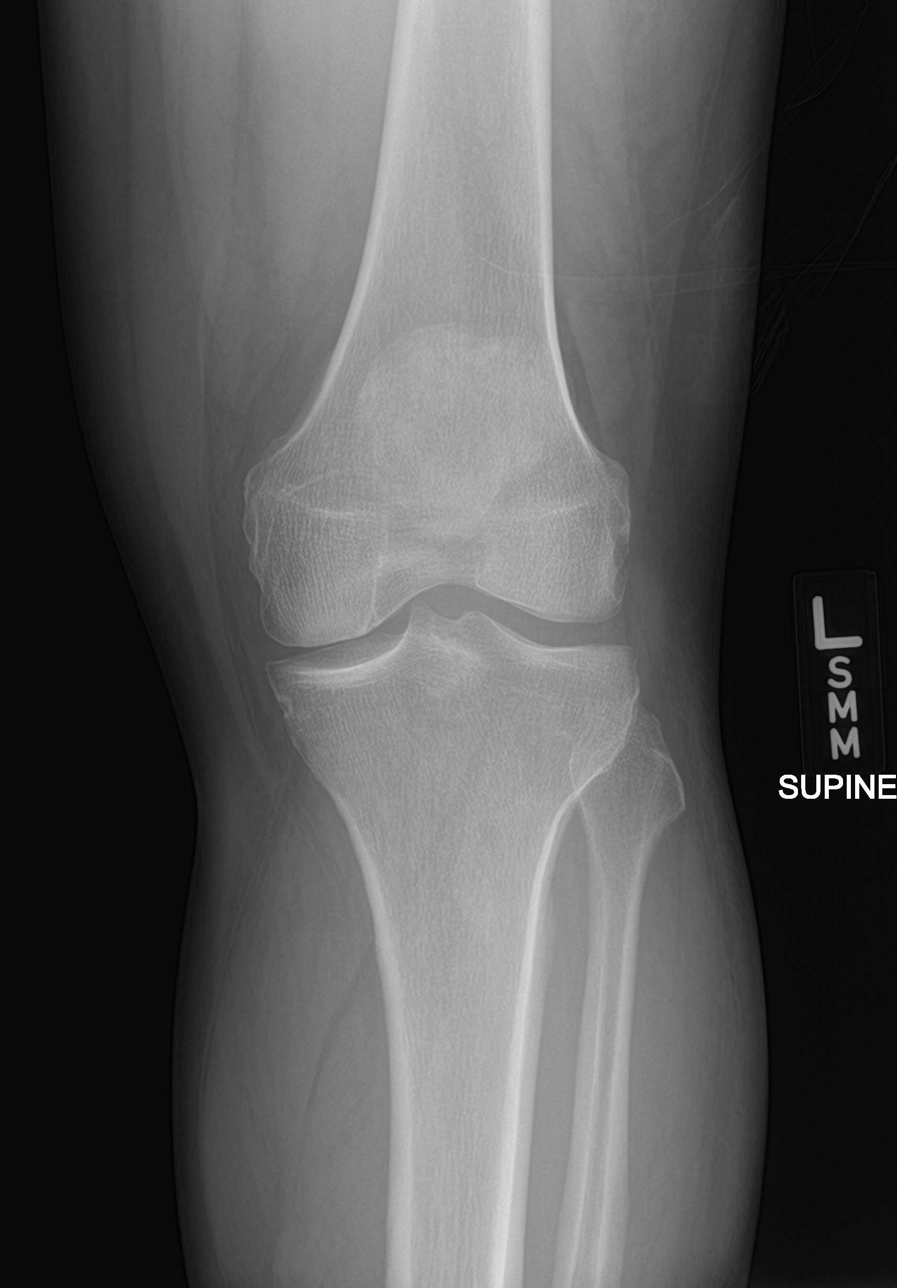

[knee obl (1 of 2)]
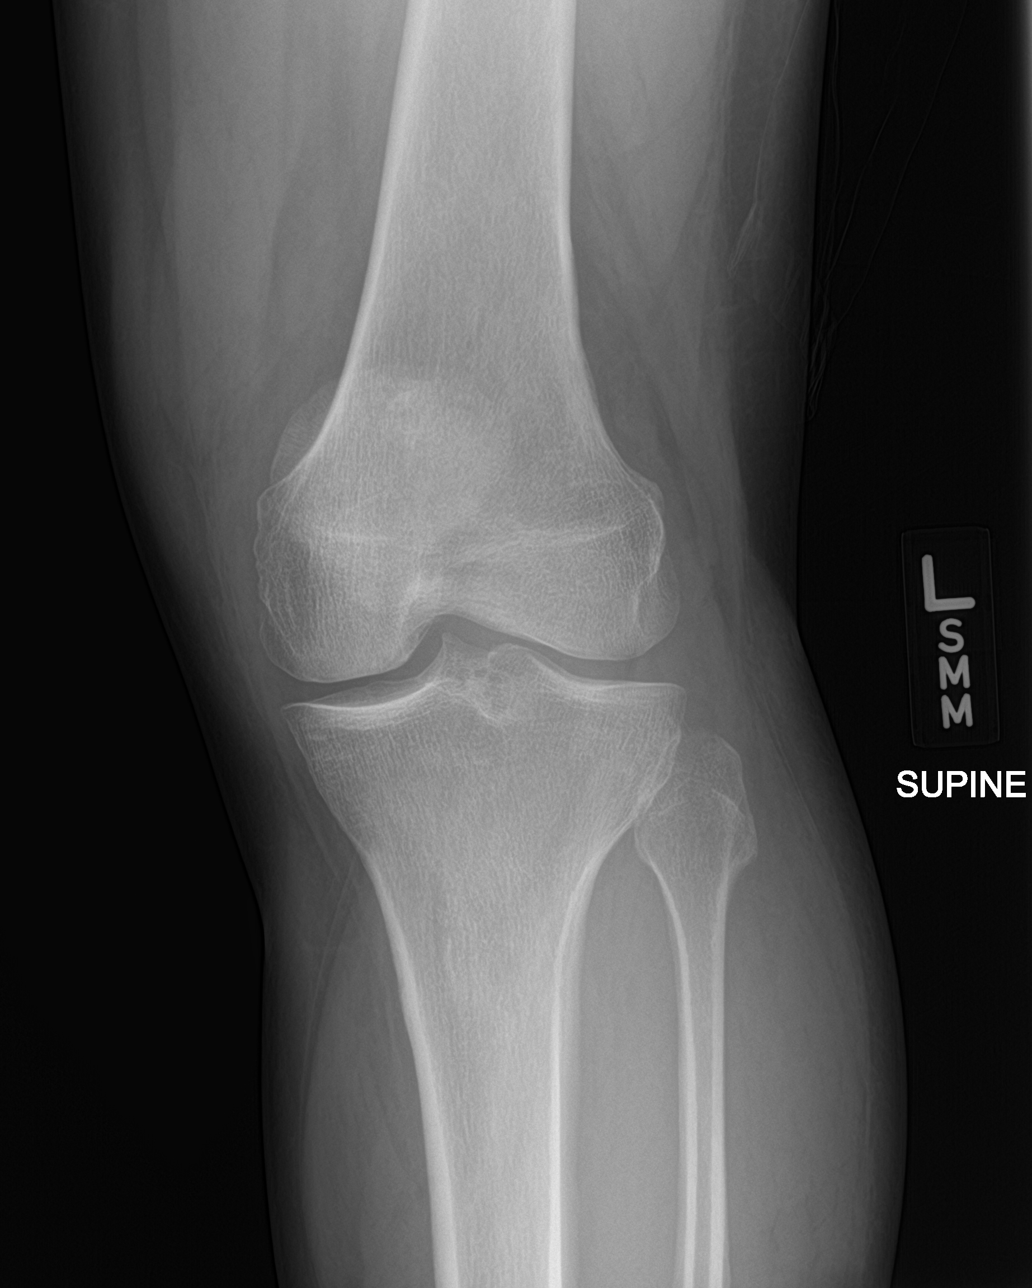

[knee obl (2 of 2)]
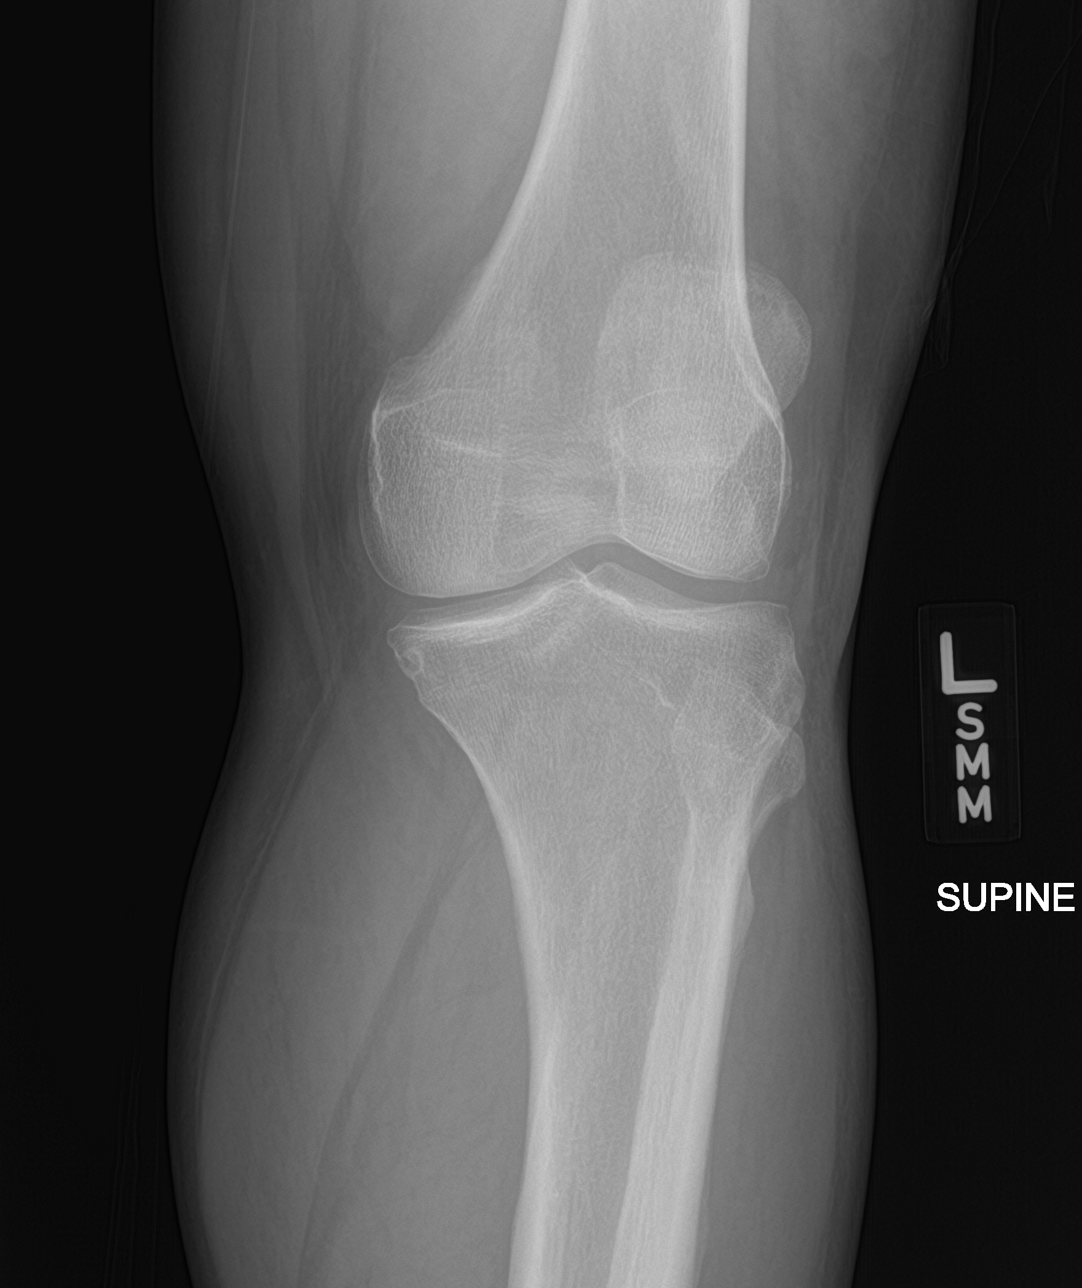

[knee lat]
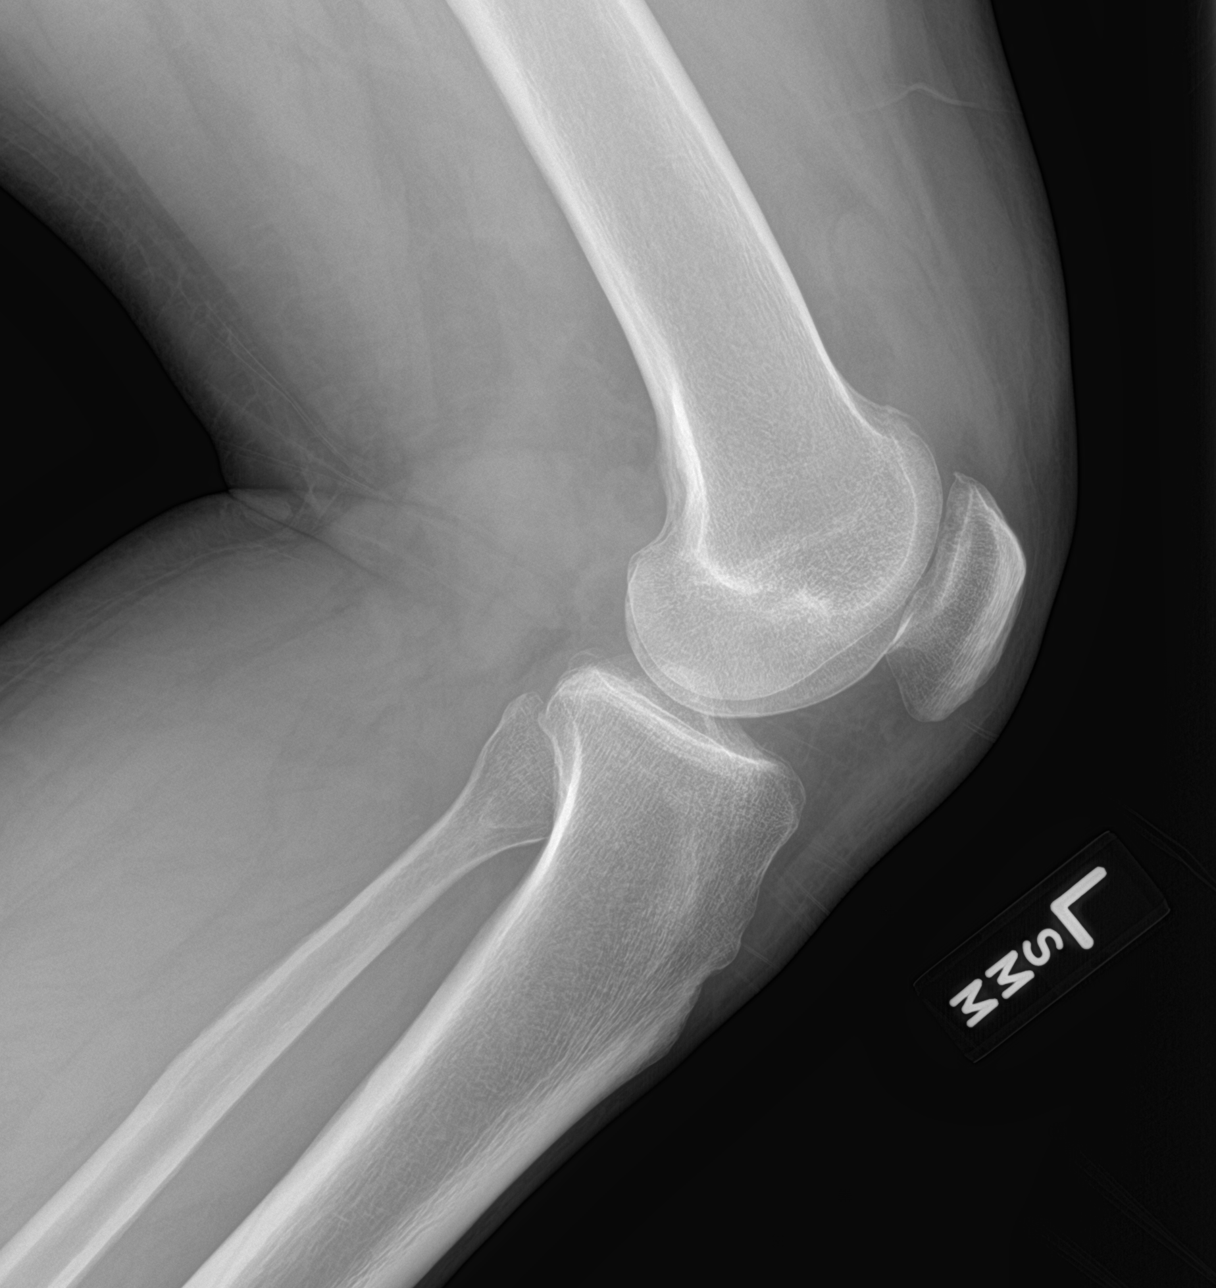

[4 of 4 positions shown; findings below may reference images not displayed]

FINDINGS: No acute displaced fracture. No evidence of joint effusion. Mild
medial and lateral joint space narrowing without marginal osteophyte
formation. Mild patellofemoral degenerative changes. No radiopaque
foreign body.
IMPRESSION: Negative for acute bony abnormality.

Mild osteoarthritis

## 2019-05-09 DIAGNOSIS — M4726 Other spondylosis with radiculopathy, lumbar region: Secondary | ICD-10-CM | POA: Diagnosis not present

## 2019-05-09 DIAGNOSIS — M5136 Other intervertebral disc degeneration, lumbar region: Secondary | ICD-10-CM | POA: Diagnosis not present

## 2019-05-09 DIAGNOSIS — M48061 Spinal stenosis, lumbar region without neurogenic claudication: Secondary | ICD-10-CM | POA: Diagnosis not present

## 2019-06-06 ENCOUNTER — Ambulatory Visit (INDEPENDENT_AMBULATORY_CARE_PROVIDER_SITE_OTHER): Payer: Medicare PPO | Admitting: Family Medicine

## 2019-06-06 VITALS — BP 128/88 | Ht 67.0 in | Wt 224.0 lb

## 2019-06-06 DIAGNOSIS — Z0000 Encounter for general adult medical examination without abnormal findings: Secondary | ICD-10-CM

## 2019-06-06 NOTE — Progress Notes (Signed)
Presents today for [employer] Visit   Date of last exam: 02/25/2019  Interpreter used for this visit? No  I connected with  Douglas Carpenter on 06/06/19 by a telephone and verified that I am speaking with the correct person using two identifiers.   I discussed the limitations of evaluation and management by telemedicine. The patient expressed understanding and agreed to proceed.   Patient Care Team: Wendie Agreste, MD as PCP - General (Family Medicine) Jovita Gamma, MD as Consulting Physician (Neurosurgery) [name], MD as Consulting Physician (Pulmonary Disease)   Other items to address today:   Discussed Eye/Dental Discussed immunizations   Other Screening:  Last lipid screening: 04/19/2017  ADVANCE DIRECTIVES: Discussed: yes On File: no Materials Provided: no   Immunization status:  Immunization History  Administered Date(s) Administered  . Influenza Split 03/21/2012  . Influenza Whole 03/08/2011  . Influenza,inj,Quad PF,6+ Mos 05/26/2014, 03/06/2015, 04/03/2017, 04/03/2018, 04/11/2019  . Pneumococcal Conjugate-13 05/26/2014, 05/03/2018  . Pneumococcal Polysaccharide-23 11/30/2012  . Tdap 04/18/2018     There are no preventive care reminders to display for this patient.   Functional Status Survey: Is the patient deaf or have difficulty hearing?: Yes(hearing aids both ears) Does the patient have difficulty seeing, even when wearing glasses/contacts?: No Does the patient have difficulty concentrating, remembering, or making decisions?: No Does the patient have difficulty walking or climbing stairs?: Yes(back and knee problems) Does the patient have difficulty dressing or bathing?: No Does the patient have difficulty doing errands alone such as visiting a doctor's office or shopping?: No   6CIT Screen 06/06/2019 04/19/2017  What Year? 0 points 0 points  What month? 0 points 0 points  What time? 0 points 0 points  Count back  from 20 0 points 0 points  Months in reverse 0 points 0 points  Repeat phrase 0 points 2 points  Total Score 0 2        Clinical Support from 06/06/2019 in [facility]  AUDIT-C Score  0       Home Environment:   Lives in one story Yes trouble climbing  Due to back and knee problems No scattered rugs No grab bars Adequate lighting  No clutter  Timed get up and Go N/A   Patient Active Problem List   Diagnosis Date Noted  . Primary osteoarthritis of left knee 04/16/2019  . Primary osteoarthritis of right knee 02/20/2019  . Chronic pain of left knee 12/05/2018  . Exercise-induced asthma 07/21/2014  . Chronic cough 07/21/2014  . Upper airway cough syndrome 07/21/2014  . Allergic rhinitis 07/21/2014  . Lumbar stenosis 11/06/2013  . Urethral stricture 01/07/2013  . Prostatic hypertrophy 11/12/2012  . VOCAL CORD DISORDER 02/19/2009  . HYPERLIPIDEMIA 01/27/2009  . Snoring 01/27/2009  . HYPERTENSION 01/27/2009  . CHRONIC RHINITIS 01/27/2009  . Asthma 01/27/2009  . GERD 01/27/2009     Past Medical History:  Diagnosis Date  . Allergy   . Arthritis    "everywhere"  . BPH (benign prostatic hypertrophy)   . Chronic rhinitis 01/27/2009  . ED (erectile dysfunction)   . GERD (gastroesophageal reflux disease)   . Headache(784.0)    migraines, as many as 3 in a week    . HYPERLIPIDEMIA 01/27/2009  . HYPERTENSION 01/27/2009  . Mild asthma    seasonal allergies, uses Proair once per yr.   . Mild obstructive sleep apnea    per pt -- mild osa test result--  no rx  cpap needed  . Neuromuscular disorder ([place])   . Sleep apnea    no cpap  . Testosterone deficiency   . Urethral stricture   . Vocal cord anomaly    pt states told from [place]  doctor heard a little gurgle sound -- no farther testing done     Past Surgical History:  Procedure Laterality Date  . COLONOSCOPY    . CYSTOSCOPY WITH URETHRAL DILATATION N/A 01/07/2013   Procedure: CYSTOSCOPY WITH URETHRAL  DILATATION BALLOON DILATION ;  Surgeon: Bernestine Amass, MD;  Location: [facility];  Service: Urology;  Laterality: N/A;  . LUMBAR LAMINECTOMY/DECOMPRESSION MICRODISCECTOMY Right 11/29/2012   Procedure: Right Lumbar Four to Five Laminectomy/ Decompression Microdiskectomy;  Surgeon: Otilio Connors, MD;  Location: [facility];  Service: Neurosurgery;  Laterality: Right;  LUMBAR LAMINECTOMY/DECOMPRESSION MICRODISCECTOMY 1 LEVEL  . POLYPECTOMY    . SHOULDER ARTHROSCOPY W/ ACROMIAL REPAIR Left 08-28-2002  . UPPER GASTROINTESTINAL ENDOSCOPY       Family History  Problem Relation Age of Onset  . Cancer Mother        lung  . Lung cancer Mother   . Heart disease Maternal Grandmother   . Heart disease Maternal Grandfather   . Cancer Maternal Grandfather        colon, prostate,lung  . Colon cancer Neg Hx   . Colon polyps Neg Hx   . Esophageal cancer Neg Hx   . Rectal cancer Neg Hx   . Stomach cancer Neg Hx      Social History   Socioeconomic History  . Marital status: Married    Spouse name: Not on file  . Number of children: Not on file  . Years of education: Not on file  . Highest education level: Not on file  Occupational History  . Occupation: retired  Tobacco Use  . Smoking status: Former Smoker    Packs/day: 0.50    Years: 22.00    Pack years: 11.00    Types: Cigarettes    Quit date: 06/27/1988    Years since quitting: 30.9  . Smokeless tobacco: Never Used  Substance and Sexual Activity  . Alcohol use: Yes    Alcohol/week: 3.0 standard drinks    Types: 3 Standard drinks or equivalent per week    Comment: 4-5 drinks / day- cognac   . Drug use: No  . Sexual activity: Not on file  Other Topics Concern  . Not on file  Social History Narrative  . Not on file   Social Determinants of Health   Financial Resource Strain:   . Difficulty of Paying Living Expenses: Not on file  Food Insecurity:   . Worried About Charity fundraiser in the Last Year: Not on  file  . Ran Out of Food in the Last Year: Not on file  Transportation Needs:   . Lack of Transportation (Medical): Not on file  . Lack of Transportation (Non-Medical): Not on file  Physical Activity:   . Days of Exercise per Week: Not on file  . Minutes of Exercise per Session: Not on file  Stress:   . Feeling of Stress : Not on file  Social Connections:   . Frequency of Communication with Friends and Family: Not on file  . Frequency of Social Gatherings with Friends and Family: Not on file  . Attends Religious Services: Not on file  . Active Member of Clubs or Organizations: Not on file  . Attends Archivist Meetings: Not  on file  . Marital Status: Not on file  Intimate Partner Violence:   . Fear of Current or Ex-Partner: Not on file  . Emotionally Abused: Not on file  . Physically Abused: Not on file  . Sexually Abused: Not on file     Allergies  Allergen Reactions  . Aspirin Rash    Pt can tolerate low doses-- INTOLERANT TO HIGHER DOSES     Prior to Admission medications   Medication Sig Start Date End Date Taking? Authorizing Provider  albuterol (PROAIR HFA) 108 (90 Base) MCG/ACT inhaler Inhale 2 puffs into the lungs every 6 (six) hours as needed for wheezing or shortness of breath. 04/18/18  Yes [name], MD  allopurinol (ZYLOPRIM) 300 MG tablet Take 1 tablet (300 mg total) by mouth daily. 12/05/18  Yes Aundra Dubin, PA-C  aspirin EC 81 MG tablet Take 81 mg by mouth every morning.   Yes [provider]  Aspirin-Acetaminophen-Caffeine (EXCEDRIN PO) Take 2 tablets by mouth 2 (two) times daily as needed (migraine).   Yes [provider]  atenolol (TENORMIN) 25 MG tablet Take 25 mg by mouth every morning.    Yes [provider]  atorvastatin (LIPITOR) 80 MG tablet Take 40 mg by mouth at bedtime.   Yes [provider]  cyclobenzaprine (FLEXERIL) 10 MG tablet Take 0.5-1 tablets (5-10 mg total) by mouth 3 (three) times daily  as needed for muscle spasms. 11/08/13  Yes Jovita Gamma, MD  diclofenac (VOLTAREN) 75 MG EC tablet Take 75 mg by mouth 2 (two) times daily.    Yes [provider]  DULoxetine (CYMBALTA) 60 MG capsule Take 1 capsule (60 mg total) by mouth daily. 11/13/17  Yes Wendie Agreste, MD  fluticasone-salmeterol (ADVAIR HFA) [phone] MCG/ACT inhaler Inhale 2 puffs into the lungs 2 (two) times daily. 04/18/18  Yes [name], MD  Ketotifen Fumarate (THERA TEARS ALLERGY OP) Place 1 drop into both eyes daily as needed (dry eyes).   Yes [provider]  montelukast (SINGULAIR) 10 MG tablet Take 1 tablet (10 mg total) by mouth at bedtime. 04/03/17  Yes [name], MD  Omega-3 Fatty Acids (FISH OIL) 1000 MG CPDR Take 2,000 mg by mouth 2 (two) times daily.     Yes [provider]  omeprazole (PRILOSEC) 20 MG capsule Take 20 mg by mouth daily.    Yes [provider]  olopatadine (PATADAY) 0.1 % ophthalmic solution Place 1 drop into both eyes 2 (two) times daily. Patient not taking: Reported on 06/06/2019 10/15/18   Ok Edwards, PA-C  topiramate (TOPAMAX) 25 MG tablet Take 25 mg by mouth daily.     [provider]  triamcinolone cream (KENALOG) 0.1 % Apply 1 application topically 3 (three) times daily. As needed to hand. Patient not taking: Reported on 06/06/2019 11/20/18   Wendie Agreste, MD     Depression screen [facility] 2/9 06/06/2019 02/25/2019 12/31/2018 11/20/2018 10/26/2018  Decreased Interest 0 0 0 0 0  Down, Depressed, Hopeless 0 0 0 0 0  PHQ - 2 Score 0 0 0 0 0  Altered sleeping - - - - -  Tired, decreased energy - - - - -  Change in appetite - - - - -  Feeling bad or failure about yourself  - - - - -  Trouble concentrating - - - - -  Moving slowly or fidgety/restless - - - - -  Suicidal thoughts - - - - -  PHQ-9 Score - - - - -  Difficult doing work/chores - - - - -     Fall Risk  06/06/2019 02/25/2019 12/31/2018 11/20/2018 10/26/2018  Falls in the past year? 0  0 0 0 0  Number falls in past yr: 0 0 - 0 0  Injury with Fall? 0 0 0 0 0  Comment - - - - -  Risk for fall due to : - - - - -  Follow up Falls evaluation completed;Education provided Falls evaluation completed - - -      PHYSICAL EXAM: BP 128/88 Comment: taken from previous  Ht 5\' 7"  (1.702 m)   Wt 224 lb (101.6 kg)   BMI 35.08 kg/m    Wt Readings from Last 3 Encounters:  06/06/19 224 lb (101.6 kg)  04/11/19 224 lb (101.6 kg)  02/25/19 224 lb 3.2 oz (101.7 kg)    Medicare annual wellness visit, subsequent     Education/Counseling provided regarding diet and exercise, prevention of chronic diseases, smoking/tobacco cessation, if applicable, and reviewed "Covered Medicare Preventive Services."

## 2019-06-06 NOTE — Patient Instructions (Signed)
Thank you for taking time to come for your Medicare Wellness Visit. I appreciate your ongoing commitment to your health goals. Please review the following plan we discussed and let me know if I can assist you in the future.  Douglas Kennedy LPN   Preventive Care 56-64 Years Old, Male Preventive care refers to lifestyle choices and visits with your health care provider that can promote health and wellness. This includes:  A yearly physical exam. This is also called an annual well check.  Regular dental and eye exams.  Immunizations.  Screening for certain conditions.  Healthy lifestyle choices, such as eating a healthy diet, getting regular exercise, not using drugs or products that contain nicotine and tobacco, and limiting alcohol use. What can I expect for my preventive care visit? Physical exam Your health care provider will check:  Height and weight. These may be used to calculate body mass index (BMI), which is a measurement that tells if you are at a healthy weight.  Heart rate and blood pressure.  Your skin for abnormal spots. Counseling Your health care provider may ask you questions about:  Alcohol, tobacco, and drug use.  Emotional well-being.  Home and relationship well-being.  Sexual activity.  Eating habits.  Work and work Statistician. What immunizations do I need?  Influenza (flu) vaccine  This is recommended every year. Tetanus, diphtheria, and pertussis (Tdap) vaccine  You may need a Td booster every 10 years. Varicella (chickenpox) vaccine  You may need this vaccine if you have not already been vaccinated. Zoster (shingles) vaccine  You may need this after age 35. Measles, mumps, and rubella (MMR) vaccine  You may need at least one dose of MMR if you were born in 1957 or later. You may also need a second dose. Pneumococcal conjugate (PCV13) vaccine  You may need this if you have certain conditions and were not previously  vaccinated. Pneumococcal polysaccharide (PPSV23) vaccine  You may need one or two doses if you smoke cigarettes or if you have certain conditions. Meningococcal conjugate (MenACWY) vaccine  You may need this if you have certain conditions. Hepatitis A vaccine  You may need this if you have certain conditions or if you travel or work in places where you may be exposed to hepatitis A. Hepatitis B vaccine  You may need this if you have certain conditions or if you travel or work in places where you may be exposed to hepatitis B. Haemophilus influenzae type b (Hib) vaccine  You may need this if you have certain risk factors. Human papillomavirus (HPV) vaccine  If recommended by your health care provider, you may need three doses over 6 months. You may receive vaccines as individual doses or as more than one vaccine together in one shot (combination vaccines). Talk with your health care provider about the risks and benefits of combination vaccines. What tests do I need? Blood tests  Lipid and cholesterol levels. These may be checked every 5 years, or more frequently if you are over 60 years old.  Hepatitis C test.  Hepatitis B test. Screening  Lung cancer screening. You may have this screening every year starting at age 40 if you have a 30-pack-year history of smoking and currently smoke or have quit within the past 15 years.  Prostate cancer screening. Recommendations will vary depending on your family history and other risks.  Colorectal cancer screening. All adults should have this screening starting at age 43 and continuing until age 79. Your health care provider  may recommend screening at age 44 if you are at increased risk. You will have tests every 1-10 years, depending on your results and the type of screening test.  Diabetes screening. This is done by checking your blood sugar (glucose) after you have not eaten for a while (fasting). You may have this done every 1-3  years.  Sexually transmitted disease (STD) testing. Follow these instructions at home: Eating and drinking  Eat a diet that includes fresh fruits and vegetables, whole grains, lean protein, and low-fat dairy products.  Take vitamin and mineral supplements as recommended by your health care provider.  Do not drink alcohol if your health care provider tells you not to drink.  If you drink alcohol: ? Limit how much you have to 0-2 drinks a day. ? Be aware of how much alcohol is in your drink. In the [place] one drink equals one 12 oz bottle of beer (355 mL), one 5 oz glass of wine (148 mL), or one 1 oz glass of hard liquor (44 mL). Lifestyle  Take daily care of your teeth and gums.  Stay active. Exercise for at least 30 minutes on 5 or more days each week.  Do not use any products that contain nicotine or tobacco, such as cigarettes, e-cigarettes, and chewing tobacco. If you need help quitting, ask your health care provider.  If you are sexually active, practice safe sex. Use a condom or other form of protection to prevent STIs (sexually transmitted infections).  Talk with your health care provider about taking a low-dose aspirin every day starting at age 68. What's next?  Go to your health care provider once a year for a well check visit.  Ask your health care provider how often you should have your eyes and teeth checked.  Stay up to date on all vaccines. This information is not intended to replace advice given to you by your health care provider. Make sure you discuss any questions you have with your health care provider. Document Released: 07/10/2015 Document Revised: 06/07/2018 Document Reviewed: 06/07/2018 Elsevier Patient Education  2020 [employer].

## 2019-06-25 ENCOUNTER — Telehealth: Payer: Self-pay | Admitting: Pulmonary Disease

## 2019-06-25 NOTE — Telephone Encounter (Signed)
Called and spoke with pt's wife Vivien Rota providing her the info in regards to how she can get an appt scheduled to be covid tested and she verbalized understanding. Nothing further needed.

## 2019-06-26 ENCOUNTER — Ambulatory Visit: Payer: Medicare PPO | Attending: Internal Medicine

## 2019-06-26 DIAGNOSIS — Z20822 Contact with and (suspected) exposure to covid-19: Secondary | ICD-10-CM

## 2019-06-26 DIAGNOSIS — Z20828 Contact with and (suspected) exposure to other viral communicable diseases: Secondary | ICD-10-CM | POA: Diagnosis not present

## 2019-06-27 LAB — NOVEL CORONAVIRUS, NAA: SARS-CoV-2, NAA: NOT DETECTED

## 2019-08-01 ENCOUNTER — Ambulatory Visit: Payer: Self-pay

## 2019-08-01 ENCOUNTER — Encounter: Payer: Self-pay | Admitting: Physician Assistant

## 2019-08-01 ENCOUNTER — Other Ambulatory Visit: Payer: Self-pay

## 2019-08-01 ENCOUNTER — Ambulatory Visit (INDEPENDENT_AMBULATORY_CARE_PROVIDER_SITE_OTHER): Payer: Medicare PPO

## 2019-08-01 ENCOUNTER — Ambulatory Visit (INDEPENDENT_AMBULATORY_CARE_PROVIDER_SITE_OTHER): Payer: Medicare PPO | Admitting: Orthopaedic Surgery

## 2019-08-01 DIAGNOSIS — M1712 Unilateral primary osteoarthritis, left knee: Secondary | ICD-10-CM

## 2019-08-01 DIAGNOSIS — M1710 Unilateral primary osteoarthritis, unspecified knee: Secondary | ICD-10-CM | POA: Diagnosis not present

## 2019-08-01 DIAGNOSIS — M1711 Unilateral primary osteoarthritis, right knee: Secondary | ICD-10-CM

## 2019-08-01 MED ORDER — BUPIVACAINE HCL 0.25 % IJ SOLN
0.6600 mL | INTRAMUSCULAR | Status: AC | PRN
  Administered 2019-08-01: .66 mL via INTRA_ARTICULAR

## 2019-08-01 MED ORDER — METHYLPREDNISOLONE ACETATE 40 MG/ML IJ SUSP
13.3300 mg | INTRAMUSCULAR | Status: AC | PRN
  Administered 2019-08-01: 13.33 mg via INTRA_ARTICULAR

## 2019-08-01 MED ORDER — LIDOCAINE HCL 1 % IJ SOLN
3.0000 mL | INTRAMUSCULAR | Status: AC | PRN
  Administered 2019-08-01: 3 mL

## 2019-08-01 NOTE — Progress Notes (Signed)
Office Visit Note   Patient: Douglas Carpenter           Date of Birth: 06/24/1956           MRN: [id number] Visit Date: 08/01/2019              Requested by: Wendie Agreste, MD [address] [place],  [place] [address] PCP: Wendie Agreste, MD   Assessment & Plan: Visit Diagnoses:  1. Primary osteoarthritis of right knee   2. Primary osteoarthritis of left knee     Plan: Impression is bilateral knee degenerative joint disease.  We will inject both knees with cortisone today.  We will go ahead and get approval for right knee viscosupplementation injection.  He will likely not be approved for left knee viscosupplementation injection till 10/15/2019, so he will call us back around that time to submit for approval.    This patient is diagnosed with osteoarthritis of the knee(s).    Radiographs show evidence of joint space narrowing, osteophytes, subchondral sclerosis and/or subchondral cysts.  This patient has knee pain which interferes with functional and activities of daily living.    This patient has experienced inadequate response, adverse effects and/or intolerance with conservative treatments such as acetaminophen, NSAIDS, topical creams, physical therapy or regular exercise, knee bracing and/or weight loss.   This patient has experienced inadequate response or has a contraindication to intra articular steroid injections for at least 3 months.   This patient is not scheduled to have a total knee replacement within 6 months of starting treatment with viscosupplementation.  This note is not being shared with the patient for the following reason: To respect privacy (The patient or proxy has requested that the information not be shared).  Follow-Up Instructions: Return for once approved for right knee gel inj.   Orders:  Orders Placed This Encounter  Procedures  . Large Joint Inj: bilateral knee  . XR KNEE 3 VIEW LEFT  . XR KNEE 3 VIEW RIGHT   No orders of the defined types  were placed in this encounter.     Procedures: Large Joint Inj: bilateral knee on 08/01/2019 9:42 AM Indications: pain Details: 22 G needle, anterolateral approach Medications (Right): 0.66 mL bupivacaine 0.25 %; 3 mL lidocaine 1 %; 13.33 mg methylPREDNISolone acetate 40 MG/ML Medications (Left): 0.66 mL bupivacaine 0.25 %; 3 mL lidocaine 1 %; 13.33 mg methylPREDNISolone acetate 40 MG/ML      Clinical Data: No additional findings.   Subjective: Chief Complaint  Patient presents with  . Left Knee - Pain    HPI patient is a pleasant 64 year old gentleman who comes in today with recurrent bilateral knee pain left greater than right.  History of bilateral knee osteoarthritis.  He has been seeing [place] for many months where he has had cortisone injections to both knees as well as viscosupplementation injections to the left knee.  The pain he has is constant but worse going from a seated to standing position.  He does have pain at night primarily to the left knee.  No mechanical symptoms.  He has been using Voltaren gel as well as knee sleeve with moderate symptoms.  His last cortisone injection to the right knee was in early October 2020.  No viscosupplementation injection to the right knee.  Last cortisone injection to the left knee was in August 2020 and the last gel injection to the left knee was in October 2020.  He does note that he usually gets approximately 3  months relief from each injection.  Review of Systems as detailed in HPI.  All others reviewed and are negative.   Objective: Vital Signs: There were no vitals taken for this visit.  Physical Exam well-developed well-nourished gentleman in no acute distress.  Alert and oriented x3.  Ortho Exam examination of both knees reveals range of motion from 0 to 115 degrees.  Medial joint line tenderness.  Moderate patellofemoral crepitus.  Ligaments are stable.  He is neurovascular intact distally.  Specialty Comments:  No specialty  comments available.  Imaging: XR KNEE 3 VIEW LEFT  Result Date: 08/01/2019 Moderate medial and patellofemoral degenerative changes  XR KNEE 3 VIEW RIGHT  Result Date: 08/01/2019 Moderate medial and patellofemoral degenerative changes    PMFS History: Patient Active Problem List   Diagnosis Date Noted  . Primary osteoarthritis of left knee 04/16/2019  . Primary osteoarthritis of right knee 02/20/2019  . Chronic pain of left knee 12/05/2018  . Exercise-induced asthma 07/21/2014  . Chronic cough 07/21/2014  . Upper airway cough syndrome 07/21/2014  . Allergic rhinitis 07/21/2014  . Lumbar stenosis 11/06/2013  . Urethral stricture 01/07/2013  . Prostatic hypertrophy 11/12/2012  . VOCAL CORD DISORDER 02/19/2009  . HYPERLIPIDEMIA 01/27/2009  . Snoring 01/27/2009  . HYPERTENSION 01/27/2009  . CHRONIC RHINITIS 01/27/2009  . Asthma 01/27/2009  . GERD 01/27/2009   Past Medical History:  Diagnosis Date  . Allergy   . Arthritis    "everywhere"  . BPH (benign prostatic hypertrophy)   . Chronic rhinitis 01/27/2009  . ED (erectile dysfunction)   . GERD (gastroesophageal reflux disease)   . Headache(784.0)    migraines, as many as 3 in a week    . HYPERLIPIDEMIA 01/27/2009  . HYPERTENSION 01/27/2009  . Mild asthma    seasonal allergies, uses Proair once per yr.   . Mild obstructive sleep apnea    per pt -- mild osa test result--  no rx cpap needed  . Neuromuscular disorder ([place])   . Sleep apnea    no cpap  . Testosterone deficiency   . Urethral stricture   . Vocal cord anomaly    pt states told from [place]  doctor heard a little gurgle sound -- no farther testing done    Family History  Problem Relation Age of Onset  . Cancer Mother        lung  . Lung cancer Mother   . Heart disease Maternal Grandmother   . Heart disease Maternal Grandfather   . Cancer Maternal Grandfather        colon, prostate,lung  . Colon cancer Neg Hx   . Colon polyps Neg Hx   . Esophageal cancer Neg  Hx   . Rectal cancer Neg Hx   . Stomach cancer Neg Hx     Past Surgical History:  Procedure Laterality Date  . COLONOSCOPY    . CYSTOSCOPY WITH URETHRAL DILATATION N/A 01/07/2013   Procedure: CYSTOSCOPY WITH URETHRAL DILATATION BALLOON DILATION ;  Surgeon: Bernestine Amass, MD;  Location: [facility];  Service: Urology;  Laterality: N/A;  . LUMBAR LAMINECTOMY/DECOMPRESSION MICRODISCECTOMY Right 11/29/2012   Procedure: Right Lumbar Four to Five Laminectomy/ Decompression Microdiskectomy;  Surgeon: Otilio Connors, MD;  Location: [facility];  Service: Neurosurgery;  Laterality: Right;  LUMBAR LAMINECTOMY/DECOMPRESSION MICRODISCECTOMY 1 LEVEL  . POLYPECTOMY    . SHOULDER ARTHROSCOPY W/ ACROMIAL REPAIR Left 08-28-2002  . UPPER GASTROINTESTINAL ENDOSCOPY     Social History   Occupational History  .  Occupation: retired  Tobacco Use  . Smoking status: Former Smoker    Packs/day: 0.50    Years: 22.00    Pack years: 11.00    Types: Cigarettes    Quit date: 06/27/1988    Years since quitting: 31.1  . Smokeless tobacco: Never Used  Substance and Sexual Activity  . Alcohol use: Yes    Alcohol/week: 3.0 standard drinks    Types: 3 Standard drinks or equivalent per week    Comment: 4-5 drinks / day- cognac   . Drug use: No  . Sexual activity: Not on file

## 2019-08-02 ENCOUNTER — Telehealth: Payer: Self-pay

## 2019-08-02 NOTE — Telephone Encounter (Signed)
Please submit for gel injection right knee-Dr. XU.  

## 2019-08-05 ENCOUNTER — Telehealth: Payer: Self-pay

## 2019-08-05 NOTE — Telephone Encounter (Signed)
Approved for Monovisc-Right knee Dr Frederik Pear and [employer] plan follows medicare guidelines. It will pickup remaining eligible expenses at 100%. It does cover the Medicare Part B deductible. No prior auth required.

## 2019-08-05 NOTE — Telephone Encounter (Signed)
Submitted for VOB for Monovisc-right knee °

## 2019-08-05 NOTE — Telephone Encounter (Signed)
LVM for pt to call back to schedule appointment for Monovisc right knee

## 2019-08-05 NOTE — Telephone Encounter (Signed)
Submitted

## 2019-08-16 ENCOUNTER — Telehealth: Payer: Self-pay

## 2019-08-16 NOTE — Telephone Encounter (Signed)
Left message for pt to call back to schedule gel injection

## 2019-08-20 ENCOUNTER — Other Ambulatory Visit: Payer: Self-pay

## 2019-08-20 ENCOUNTER — Ambulatory Visit: Payer: Medicare PPO | Attending: Internal Medicine

## 2019-08-20 DIAGNOSIS — Z20822 Contact with and (suspected) exposure to covid-19: Secondary | ICD-10-CM

## 2019-08-21 LAB — NOVEL CORONAVIRUS, NAA: SARS-CoV-2, NAA: NOT DETECTED

## 2019-10-25 ENCOUNTER — Telehealth: Payer: Self-pay

## 2019-10-25 ENCOUNTER — Other Ambulatory Visit: Payer: Self-pay

## 2019-10-25 ENCOUNTER — Encounter: Payer: Self-pay | Admitting: Orthopaedic Surgery

## 2019-10-25 ENCOUNTER — Ambulatory Visit (INDEPENDENT_AMBULATORY_CARE_PROVIDER_SITE_OTHER): Payer: Medicare PPO | Admitting: Orthopaedic Surgery

## 2019-10-25 VITALS — Ht 67.5 in | Wt 231.0 lb

## 2019-10-25 DIAGNOSIS — M1711 Unilateral primary osteoarthritis, right knee: Secondary | ICD-10-CM | POA: Diagnosis not present

## 2019-10-25 DIAGNOSIS — M1712 Unilateral primary osteoarthritis, left knee: Secondary | ICD-10-CM | POA: Diagnosis not present

## 2019-10-25 MED ORDER — METHYLPREDNISOLONE ACETATE 40 MG/ML IJ SUSP
40.0000 mg | INTRAMUSCULAR | Status: AC | PRN
  Administered 2019-10-25: 10:00:00 40 mg via INTRA_ARTICULAR

## 2019-10-25 MED ORDER — LIDOCAINE HCL 1 % IJ SOLN
2.0000 mL | INTRAMUSCULAR | Status: AC | PRN
  Administered 2019-10-25: 10:00:00 2 mL

## 2019-10-25 MED ORDER — BUPIVACAINE HCL 0.5 % IJ SOLN
2.0000 mL | INTRAMUSCULAR | Status: AC | PRN
  Administered 2019-10-25: 10:00:00 2 mL via INTRA_ARTICULAR

## 2019-10-25 NOTE — Telephone Encounter (Signed)
Please submit for gel injection-right knee- Dr. Xu.  

## 2019-10-25 NOTE — Progress Notes (Signed)
Office Visit Note   Patient: Douglas Carpenter           Date of Birth: [birth year]           MRN: [id number] Visit Date: 10/25/2019              Requested by: Wendie Agreste, MD [address] [place],  [place] [address] PCP: Wendie Agreste, MD   Assessment & Plan: Visit Diagnoses:  1. Primary osteoarthritis of left knee   2. Primary osteoarthritis of right knee     Plan: Impression is end-stage bilateral knee DJD worse on the left.  At this point patient is interested in having a left total knee replacement.  Based on discussion of risk benefits alternatives rehab recovery he has elected to proceed with scheduling for left total knee replacement.  Denies a history of DVT or nickel allergy.  For the right knee we injected this with cortisone today.  Follow-Up Instructions: Return if symptoms worsen or fail to improve.   Orders:  No orders of the defined types were placed in this encounter.  No orders of the defined types were placed in this encounter.     Procedures: Large Joint Inj: R knee on 10/25/2019 9:56 AM Indications: pain Details: 22 G needle  Arthrogram: No  Medications: 40 mg methylPREDNISolone acetate 40 MG/ML; 2 mL lidocaine 1 %; 2 mL bupivacaine 0.5 % Consent was given by the patient. Patient was prepped and draped in the usual sterile fashion.       Clinical Data: No additional findings.   Subjective: Chief Complaint  Patient presents with  . Right Knee - Pain  . Left Knee - Pain    Douglas Carpenter is a very pleasant 64 year old gentleman comes in for follow-up of bilateral knee DJD.  We have administered multiple injections and undergone extensive conservative treatment and he continues to have incomplete pain relief and significant functional disability and limitation due to bilateral knee DJD.  In the left knee is worse and he is interested in a left total knee replacement.  No changes in medical history.   Review of Systems  Constitutional: Negative.    All other systems reviewed and are negative.    Objective: Vital Signs: Ht 5' 7.5" (1.715 m)   Wt 231 lb (104.8 kg)   BMI 35.65 kg/m   Physical Exam Vitals and nursing note reviewed.  Constitutional:      Appearance: He is well-developed.  Pulmonary:     Effort: Pulmonary effort is normal.  Abdominal:     Palpations: Abdomen is soft.  Skin:    General: Skin is warm.  Neurological:     Mental Status: He is alert and oriented to person, place, and time.  Psychiatric:        Behavior: Behavior normal.        Thought Content: Thought content normal.        Judgment: Judgment normal.     Ortho Exam Left knee shows a trace joint effusion.  Painful range of motion.  Collaterals and cruciates are stable. Specialty Comments:  No specialty comments available.  Imaging: No results found.   PMFS History: Patient Active Problem List   Diagnosis Date Noted  . Primary osteoarthritis of left knee 04/16/2019  . Primary osteoarthritis of right knee 02/20/2019  . Chronic pain of left knee 12/05/2018  . Exercise-induced asthma 07/21/2014  . Chronic cough 07/21/2014  . Upper airway cough syndrome 07/21/2014  . Allergic rhinitis 07/21/2014  .  Lumbar stenosis 11/06/2013  . Urethral stricture 01/07/2013  . Prostatic hypertrophy 11/12/2012  . VOCAL CORD DISORDER 02/19/2009  . HYPERLIPIDEMIA 01/27/2009  . Snoring 01/27/2009  . HYPERTENSION 01/27/2009  . CHRONIC RHINITIS 01/27/2009  . Asthma 01/27/2009  . GERD 01/27/2009   Past Medical History:  Diagnosis Date  . Allergy   . Arthritis    "everywhere"  . BPH (benign prostatic hypertrophy)   . Chronic rhinitis 01/27/2009  . ED (erectile dysfunction)   . GERD (gastroesophageal reflux disease)   . Headache(784.0)    migraines, as many as 3 in a week    . HYPERLIPIDEMIA 01/27/2009  . HYPERTENSION 01/27/2009  . Mild asthma    seasonal allergies, uses Proair once per yr.   . Mild obstructive sleep apnea    per pt -- mild osa test  result--  no rx cpap needed  . Neuromuscular disorder ([place])   . Sleep apnea    no cpap  . Testosterone deficiency   . Urethral stricture   . Vocal cord anomaly    pt states told from [place]  doctor heard a little gurgle sound -- no farther testing done    Family History  Problem Relation Age of Onset  . Cancer Mother        lung  . Lung cancer Mother   . Heart disease Maternal Grandmother   . Heart disease Maternal Grandfather   . Cancer Maternal Grandfather        colon, prostate,lung  . Colon cancer Neg Hx   . Colon polyps Neg Hx   . Esophageal cancer Neg Hx   . Rectal cancer Neg Hx   . Stomach cancer Neg Hx     Past Surgical History:  Procedure Laterality Date  . COLONOSCOPY    . CYSTOSCOPY WITH URETHRAL DILATATION N/A 01/07/2013   Procedure: CYSTOSCOPY WITH URETHRAL DILATATION BALLOON DILATION ;  Surgeon: Bernestine Amass, MD;  Location: [facility];  Service: Urology;  Laterality: N/A;  . LUMBAR LAMINECTOMY/DECOMPRESSION MICRODISCECTOMY Right 11/29/2012   Procedure: Right Lumbar Four to Five Laminectomy/ Decompression Microdiskectomy;  Surgeon: Otilio Connors, MD;  Location: [facility];  Service: Neurosurgery;  Laterality: Right;  LUMBAR LAMINECTOMY/DECOMPRESSION MICRODISCECTOMY 1 LEVEL  . POLYPECTOMY    . SHOULDER ARTHROSCOPY W/ ACROMIAL REPAIR Left 08-28-2002  . UPPER GASTROINTESTINAL ENDOSCOPY     Social History   Occupational History  . Occupation: retired  Tobacco Use  . Smoking status: Former Smoker    Packs/day: 0.50    Years: 22.00    Pack years: 11.00    Types: Cigarettes    Quit date: 06/27/1988    Years since quitting: 31.3  . Smokeless tobacco: Never Used  Substance and Sexual Activity  . Alcohol use: Yes    Alcohol/week: 3.0 standard drinks    Types: 3 Standard drinks or equivalent per week    Comment: 4-5 drinks / day- cognac   . Drug use: No  . Sexual activity: Not on file

## 2019-10-25 NOTE — Telephone Encounter (Signed)
Approved for Monovisc-Right knee Dr Frederik Pear and [employer] plan follows medicare guidelines. It will pickup remaining eligible expenses at 100%. It does cover the Medicare Part B deductible. No prior auth required.

## 2019-10-28 NOTE — Telephone Encounter (Signed)
Called and scheduled pt for gel injection. He also stated no one had called him back about scheduling a knee replacement for the left. Please call pt back to advise, thank you

## 2019-11-05 ENCOUNTER — Telehealth: Payer: Self-pay | Admitting: Orthopaedic Surgery

## 2019-11-05 NOTE — Telephone Encounter (Signed)
I called pt and scheduled surgery.

## 2019-11-05 NOTE — Telephone Encounter (Signed)
Pt called stating he discussed surgery with Dr. Erlinda Hong but hasn't been updated since then; pt would like a call back to discuss the next steps.   [phone]

## 2019-11-05 NOTE — Telephone Encounter (Signed)
Please call patient. Thanks.

## 2019-11-07 ENCOUNTER — Other Ambulatory Visit: Payer: Self-pay

## 2019-11-11 ENCOUNTER — Other Ambulatory Visit: Payer: Self-pay

## 2019-11-11 ENCOUNTER — Encounter: Payer: Self-pay | Admitting: Pulmonary Disease

## 2019-11-11 ENCOUNTER — Ambulatory Visit (INDEPENDENT_AMBULATORY_CARE_PROVIDER_SITE_OTHER): Payer: Medicare PPO | Admitting: Pulmonary Disease

## 2019-11-11 VITALS — BP 124/64 | HR 88 | Temp 97.6°F | Ht 67.0 in | Wt 232.2 lb

## 2019-11-11 DIAGNOSIS — H1013 Acute atopic conjunctivitis, bilateral: Secondary | ICD-10-CM | POA: Diagnosis not present

## 2019-11-11 DIAGNOSIS — R0683 Snoring: Secondary | ICD-10-CM

## 2019-11-11 DIAGNOSIS — J4540 Moderate persistent asthma, uncomplicated: Secondary | ICD-10-CM

## 2019-11-11 DIAGNOSIS — J301 Allergic rhinitis due to pollen: Secondary | ICD-10-CM | POA: Diagnosis not present

## 2019-11-11 NOTE — Patient Instructions (Signed)
Use advair twice per day  Call if you are still having trouble with your sleep  Follow up in 6 months

## 2019-11-11 NOTE — Progress Notes (Signed)
[facility]  Chief Complaint  Patient presents with  . Follow-up    Constitutional:  BP 124/64 (BP Location: Left Arm, Cuff Size: Normal)   Pulse 88   Temp 97.6 F (36.4 C) (Temporal)   Ht 5\' 7"  (1.702 m)   Wt 232 lb 3.2 oz (105.3 kg)   SpO2 96% Comment: RA  BMI 36.37 kg/m   Past Medical History:  Low T, HTN, HLD, HA, GERD, BPH, OA  Brief Summary:  Douglas Carpenter is a 64 y.o. male former smoker with chronic cough in setting of Allergic asthma, Post nasal drip, GERD, and vocal cord disease. He also has hx of snoring.  Subjective:  He hasn't been using his advair on regular basis.  His wife notices him wheezing more.  This happens especially when he is working in the yard.  He doesn't feel like he can take as deep of a breath.  He is also snoring more at night, especially when sleeping on his back.  He did get Moderna COVID vaccine.  Physical Exam:   Appearance - well kempt   ENMT - no sinus tenderness, no oral exudate, no LAN, Mallampati 4 airway, no stridor, scalloped tongue  Respiratory - equal breath sounds bilaterally, no wheezing or rales  CV - s1s2 regular rate and rhythm, no murmurs  Ext - no clubbing, no edema  Skin - no rashes  Psych - normal mood and affect    Assessment/Plan:   Allergic asthma with exercise induced bronchoconstriction. - advised him to use advair bid on regular basis - continue singulair and prn albuterol - he gets meds from [place]  Upper airway cough syndrome. - continue singulair and prn claritin  Allergic conjunctivitis. - prn pataday  Snoring. - if his sleep issues persist after he is on regular use of advair, then he is to call to arrange for home sleep study  Right knee pain. - he is scheduled for knee replacement with Dr. Frankey Shown in June - he can proceed with surgery   Follow up:   Patient Instructions  Use advair twice per day  Call if you are still having trouble with  your sleep  Follow up in 6 months   Signature:  [name], MD [place] Pager: [phone] 11/11/2019, 10:07 AM  Flow Sheet     Pulmonary tests:  PFT 02/19/09 >> FEV1 2.90 (90%), FEV1% 70, TLC 5.62(93%), DLCO 99%, +BD PFT 07/21/14 >> FEV1 2.70 (93%), FEV1% 74, TLC 5.97 (91%), RV 2.65 (126%), DLCO 106%, +BD RAST 07/21/14 >> react to dust mites, grasses; IgE 49 FeNO 10/27/15 >> 228  Sleep tests:  HST 03/07/13 >> AHI 3.9, SaO2 low 80%  Medications:   Allergies as of 11/11/2019      Reactions   Aspirin Rash   Pt can tolerate low doses-- INTOLERANT TO HIGHER DOSES      Medication List       Accurate as of Nov 11, 2019 10:07 AM. If you have any questions, ask your nurse or doctor.        albuterol 108 (90 Base) MCG/ACT inhaler Commonly known as: ProAir HFA Inhale 2 puffs into the lungs every 6 (six) hours as needed for wheezing or shortness of breath.   allopurinol 300 MG tablet Commonly known as: ZYLOPRIM Take 1 tablet (300 mg total) by mouth daily.   aspirin EC 81 MG tablet Take 81 mg by mouth every morning.   atenolol 25 MG tablet  Commonly known as: TENORMIN Take 25 mg by mouth every morning.   atorvastatin 80 MG tablet Commonly known as: LIPITOR Take 40 mg by mouth at bedtime.   cyclobenzaprine 10 MG tablet Commonly known as: FLEXERIL Take 0.5-1 tablets (5-10 mg total) by mouth 3 (three) times daily as needed for muscle spasms.   diclofenac 75 MG EC tablet Commonly known as: VOLTAREN Take 75 mg by mouth 2 (two) times daily.   DULoxetine 60 MG capsule Commonly known as: Cymbalta Take 1 capsule (60 mg total) by mouth daily.   EXCEDRIN PO Take 2 tablets by mouth 2 (two) times daily as needed (migraine).   Fish Oil 1000 MG Cpdr Take 2,000 mg by mouth 2 (two) times daily.   fluticasone-salmeterol 230-21 MCG/ACT inhaler Commonly known as: Advair HFA Inhale 2 puffs into the lungs 2 (two) times daily.   montelukast 10 MG  tablet Commonly known as: SINGULAIR Take 1 tablet (10 mg total) by mouth at bedtime.   olopatadine 0.1 % ophthalmic solution Commonly known as: Pataday Place 1 drop into both eyes 2 (two) times daily.   omeprazole 20 MG capsule Commonly known as: PRILOSEC Take 20 mg by mouth daily.   THERA TEARS ALLERGY OP Place 1 drop into both eyes daily as needed (dry eyes).   topiramate 25 MG tablet Commonly known as: TOPAMAX Take 25 mg by mouth daily.   triamcinolone cream 0.1 % Commonly known as: KENALOG Apply 1 application topically 3 (three) times daily. As needed to hand.       Past Surgical History:  He  has a past surgical history that includes Lumbar laminectomy/decompression microdiscectomy (Right, 11/29/2012); Shoulder arthroscopy w/ acromial repair (Left, 08-28-2002); Cystoscopy with urethral dilatation (N/A, 01/07/2013); Colonoscopy; Polypectomy; and Upper gastrointestinal endoscopy.  Family History:  His family history includes Cancer in his maternal grandfather and mother; Heart disease in his maternal grandfather and maternal grandmother; Lung cancer in his mother.  Social History:  He  reports that he quit smoking about 31 years ago. His smoking use included cigarettes. He has a 11.00 pack-year smoking history. He has never used smokeless tobacco. He reports current alcohol use of about 3.0 standard drinks of alcohol per week. He reports that he does not use drugs.

## 2019-11-12 ENCOUNTER — Encounter: Payer: Self-pay | Admitting: Orthopaedic Surgery

## 2019-11-12 ENCOUNTER — Ambulatory Visit (INDEPENDENT_AMBULATORY_CARE_PROVIDER_SITE_OTHER): Payer: Medicare PPO | Admitting: Orthopaedic Surgery

## 2019-11-12 DIAGNOSIS — M1712 Unilateral primary osteoarthritis, left knee: Secondary | ICD-10-CM

## 2019-11-12 DIAGNOSIS — M1711 Unilateral primary osteoarthritis, right knee: Secondary | ICD-10-CM

## 2019-11-12 MED ORDER — HYALURONAN 88 MG/4ML IX SOSY
88.0000 mg | PREFILLED_SYRINGE | INTRA_ARTICULAR | Status: AC | PRN
  Administered 2019-11-12: 88 mg via INTRA_ARTICULAR

## 2019-11-12 NOTE — Progress Notes (Signed)
Office Visit Note   Patient: Douglas Carpenter           Date of Birth: 05/31/1956           MRN: [id number] Visit Date: 11/12/2019              Requested by: Wendie Agreste, MD [address] [place],  [place] [address] PCP: Rocco Serene, MD   Assessment & Plan: Visit Diagnoses:  1. Primary osteoarthritis of left knee   2. Primary osteoarthritis of right knee     Plan: The right knee was injected with Monovisc today.  In terms of her left knee all questions were answered to their satisfaction.  I look forward to treating them in the operating room in the near future.  Follow-Up Instructions: Return if symptoms worsen or fail to improve.   Orders:  Orders Placed This Encounter  Procedures  . Large Joint Inj: R knee   No orders of the defined types were placed in this encounter.     Procedures: Large Joint Inj: R knee on 11/12/2019 9:30 AM Indications: pain Details: 22 G needle  Arthrogram: No  Medications: 88 mg Hyaluronan 88 MG/4ML Outcome: tolerated well, no immediate complications Patient was prepped and draped in the usual sterile fashion.       Clinical Data: No additional findings.   Subjective: Chief Complaint  Patient presents with  . Right Knee - Follow-up    Douglas Carpenter returns today for right knee Monovisc injection as well as his left knee DJD.  He is scheduled for surgery on December 02, 2019.  His wife has questions today.   Review of Systems   Objective: Vital Signs: There were no vitals taken for this visit.  Physical Exam  Ortho Exam Knee exams are unchanged. Specialty Comments:  No specialty comments available.  Imaging: No results found.   PMFS History: Patient Active Problem List   Diagnosis Date Noted  . Primary osteoarthritis of left knee 04/16/2019  . Primary osteoarthritis of right knee 02/20/2019  . Chronic pain of left knee 12/05/2018  . Exercise-induced asthma 07/21/2014  . Chronic cough 07/21/2014  . Upper airway  cough syndrome 07/21/2014  . Allergic rhinitis 07/21/2014  . Lumbar stenosis 11/06/2013  . Urethral stricture 01/07/2013  . Prostatic hypertrophy 11/12/2012  . VOCAL CORD DISORDER 02/19/2009  . HYPERLIPIDEMIA 01/27/2009  . Snoring 01/27/2009  . HYPERTENSION 01/27/2009  . CHRONIC RHINITIS 01/27/2009  . Asthma 01/27/2009  . GERD 01/27/2009   Past Medical History:  Diagnosis Date  . Allergy   . Arthritis    "everywhere"  . BPH (benign prostatic hypertrophy)   . Chronic rhinitis 01/27/2009  . ED (erectile dysfunction)   . GERD (gastroesophageal reflux disease)   . Headache(784.0)    migraines, as many as 3 in a week    . HYPERLIPIDEMIA 01/27/2009  . HYPERTENSION 01/27/2009  . Mild asthma    seasonal allergies, uses Proair once per yr.   . Mild obstructive sleep apnea    per pt -- mild osa test result--  no rx cpap needed  . Neuromuscular disorder ([place])   . Sleep apnea    no cpap  . Testosterone deficiency   . Urethral stricture   . Vocal cord anomaly    pt states told from [place]  doctor heard a little gurgle sound -- no farther testing done    Family History  Problem Relation Age of Onset  . Cancer Mother  lung  . Lung cancer Mother   . Heart disease Maternal Grandmother   . Heart disease Maternal Grandfather   . Cancer Maternal Grandfather        colon, prostate,lung  . Colon cancer Neg Hx   . Colon polyps Neg Hx   . Esophageal cancer Neg Hx   . Rectal cancer Neg Hx   . Stomach cancer Neg Hx     Past Surgical History:  Procedure Laterality Date  . COLONOSCOPY    . CYSTOSCOPY WITH URETHRAL DILATATION N/A 01/07/2013   Procedure: CYSTOSCOPY WITH URETHRAL DILATATION BALLOON DILATION ;  Surgeon: Bernestine Amass, MD;  [facility];  Service: Urology;  Laterality: N/A;  . LUMBAR LAMINECTOMY/DECOMPRESSION MICRODISCECTOMY Right 11/29/2012   Procedure: Right Lumbar Four to Five Laminectomy/ Decompression Microdiskectomy;  Surgeon: Otilio Connors, MD;   Location: [facility];  Service: Neurosurgery;  Laterality: Right;  LUMBAR LAMINECTOMY/DECOMPRESSION MICRODISCECTOMY 1 LEVEL  . POLYPECTOMY    . SHOULDER ARTHROSCOPY W/ ACROMIAL REPAIR Left 08-28-2002  . UPPER GASTROINTESTINAL ENDOSCOPY     Social History   Occupational History  . Occupation: retired  Tobacco Use  . Smoking status: Former Smoker    Packs/day: 0.50    Years: 22.00    Pack years: 11.00    Types: Cigarettes    Quit date: 06/27/1988    Years since quitting: 31.3  . Smokeless tobacco: Never Used  Substance and Sexual Activity  . Alcohol use: Yes    Alcohol/week: 3.0 standard drinks    Types: 3 Standard drinks or equivalent per week    Comment: 4-5 drinks / day- cognac   . Drug use: No  . Sexual activity: Not on file

## 2019-11-20 ENCOUNTER — Other Ambulatory Visit: Payer: Self-pay | Admitting: Family

## 2019-11-27 NOTE — Progress Notes (Signed)
[place], [place] [address] Phone: [phone] Fax: [phone]    Your procedure is scheduled on Monday, June 7th  Report to [facility] Main Entrance "A" at 8:00 A.M., and check in at the Admitting office.  Call this number if you have problems the morning of surgery:  [phone]  Call [phone] if you have any questions prior to your surgery date Monday-Friday 8am-4pm   Remember:  Do not eat after midnight the night before your surgery  You may drink clear liquids until 7:00 A.M.  the morning of your surgery.   Clear liquids allowed are: Water, Non-Citrus Juices (without pulp), Carbonated Beverages, Clear Tea, Black Coffee Only, and Gatorade   Enhanced Recovery after Surgery for Orthopedics Enhanced Recovery after Surgery is a protocol used to improve the stress on your body and your recovery after surgery.  Patient Instructions  . The night before surgery:  o No food after midnight. ONLY clear liquids after midnight  .  Marland Kitchen The day of surgery (if you do NOT have diabetes):  o Drink ONE (1) Pre-Surgery Clear Ensure by 7:00 A.M. the morning of surgery.Marland Kitchen   o Finish the drink at the designated time by the pre-op nurse.  o Nothing else to drink after completing the  Pre-Surgery Clear Ensure         If you have questions, please contact your surgeon's office.   Take these medicines the morning of surgery with A SIP OF WATER allopurinol (ZYLOPRIM)  atenolol (TENORMIN)  DULoxetine (CYMBALTA)  fluticasone-salmeterol (ADVAIR HFA)/inhaler gabapentin (NEURONTIN)  omeprazole (PRILOSEC)   If needed - albuterol (PROAIR HFA) 108 (90 Base)/inhaler - bring with you the morning of surgery  Follow your surgeon's instructions on when to stop Aspirin.  If no instructions were given by your surgeon then you will need to call the office to get those instructions.    As of today, STOP taking  diclofenac (VOLTAREN), meloxicam (MOBIC),  any Aspirin containing products, Aleve, Naproxen, Ibuprofen, Motrin, Advil, Goody's, BC's, all herbal medications, fish oil, and all vitamins.                     Do not wear jewelry.            Do not wear lotions, powders, colognes, or deodorant.            Men may shave face and neck.            Do not bring valuables to the hospital.            [facility] is not responsible for any belongings or valuables.  Do NOT Smoke (Tobacco/Vapping) or drink Alcohol 24 hours prior to your procedure If you use a CPAP at night, you may bring all equipment for your overnight stay.   Contacts, glasses, dentures or bridgework may not be worn into surgery.      For patients admitted to the hospital, discharge time will be determined by your treatment team.   Patients discharged the day of surgery will not be allowed to drive home, and someone needs to stay with them for 24 hours.  Special instructions:   [facility]- Preparing For Surgery  Before surgery, you can play an important role. Because skin is not sterile, your skin needs to be as free of germs as possible. You can reduce the number of germs on your skin by washing with  CHG (chlorahexidine gluconate) Soap before surgery.  CHG is an antiseptic cleaner which kills germs and bonds with the skin to continue killing germs even after washing.    Oral Hygiene is also important to reduce your risk of infection.  Remember - BRUSH YOUR TEETH THE MORNING OF SURGERY WITH YOUR REGULAR TOOTHPASTE  Please do not use if you have an allergy to CHG or antibacterial soaps. If your skin becomes reddened/irritated stop using the CHG.  Do not shave (including legs and underarms) for at least 48 hours prior to first CHG shower. It is OK to shave your face.  Please follow these instructions carefully.   1. Shower the NIGHT BEFORE SURGERY and the MORNING OF SURGERY with CHG Soap.   2. If you chose to wash your hair, wash  your hair first as usual with your normal shampoo.  3. After you shampoo, rinse your hair and body thoroughly to remove the shampoo.  4. Use CHG as you would any other liquid soap. You can apply CHG directly to the skin and wash gently with a scrungie or a clean washcloth.   5. Apply the CHG Soap to your body ONLY FROM THE NECK DOWN.  Do not use on open wounds or open sores. Avoid contact with your eyes, ears, mouth and genitals (private parts). Wash Face and genitals (private parts)  with your normal soap.   6. Wash thoroughly, paying special attention to the area where your surgery will be performed.  7. Thoroughly rinse your body with warm water from the neck down.  8. DO NOT shower/wash with your normal soap after using and rinsing off the CHG Soap.  9. Pat yourself dry with a CLEAN TOWEL.  10. Wear CLEAN PAJAMAS to bed the night before surgery, wear comfortable clothes the morning of surgery  11. Place CLEAN SHEETS on your bed the night of your first shower and DO NOT SLEEP WITH PETS.  Day of Surgery: Shower with CHG soap as instructed above.  Do not apply any deodorants/lotions.  Please wear clean clothes to the hospital/surgery center.   Remember to brush your teeth WITH YOUR REGULAR TOOTHPASTE.   Please read over the following fact sheets that you were given.

## 2019-11-28 ENCOUNTER — Encounter (HOSPITAL_COMMUNITY): Payer: Self-pay

## 2019-11-28 ENCOUNTER — Ambulatory Visit (HOSPITAL_COMMUNITY)
Admission: RE | Admit: 2019-11-28 | Discharge: 2019-11-28 | Disposition: A | Discharge status: Home / Self Care | Payer: Medicare PPO | Source: Ambulatory Visit | Attending: Family | Admitting: Family

## 2019-11-28 ENCOUNTER — Other Ambulatory Visit (HOSPITAL_COMMUNITY)
Admission: RE | Admit: 2019-11-28 | Discharge: 2019-11-28 | Disposition: A | Discharge status: Home / Self Care | Payer: Medicare PPO | Source: Ambulatory Visit | Attending: Orthopaedic Surgery | Admitting: Orthopaedic Surgery

## 2019-11-28 ENCOUNTER — Other Ambulatory Visit: Payer: Self-pay

## 2019-11-28 ENCOUNTER — Encounter (HOSPITAL_COMMUNITY)
Admission: RE | Admit: 2019-11-28 | Discharge: 2019-11-28 | Disposition: A | Discharge status: Home / Self Care | Payer: Medicare PPO | Source: Ambulatory Visit | Attending: Orthopaedic Surgery | Admitting: Orthopaedic Surgery

## 2019-11-28 DIAGNOSIS — R918 Other nonspecific abnormal finding of lung field: Secondary | ICD-10-CM | POA: Diagnosis not present

## 2019-11-28 DIAGNOSIS — Z01818 Encounter for other preprocedural examination: Secondary | ICD-10-CM | POA: Insufficient documentation

## 2019-11-28 DIAGNOSIS — Z01811 Encounter for preprocedural respiratory examination: Secondary | ICD-10-CM

## 2019-11-28 DIAGNOSIS — Z20822 Contact with and (suspected) exposure to covid-19: Secondary | ICD-10-CM | POA: Diagnosis not present

## 2019-11-28 LAB — COMPREHENSIVE METABOLIC PANEL WITH GFR
ALT: 25 U/L (ref 0–44)
AST: 26 U/L (ref 15–41)
Albumin: 4 g/dL (ref 3.5–5.0)
Alkaline Phosphatase: 40 U/L (ref 38–126)
Anion gap: 11 (ref 5–15)
BUN: 16 mg/dL (ref 8–23)
CO2: 23 mmol/L (ref 22–32)
Calcium: 8.9 mg/dL (ref 8.9–10.3)
Chloride: 108 mmol/L (ref 98–111)
Creatinine, Ser: 1.06 mg/dL (ref 0.61–1.24)
GFR calc Af Amer: >60 mL/min
GFR calc non Af Amer: >60 mL/min
Glucose, Bld: 122 mg/dL — ABNORMAL HIGH (ref 70–99)
Potassium: 3.8 mmol/L (ref 3.5–5.1)
Sodium: 142 mmol/L (ref 135–145)
Total Bilirubin: 1.1 mg/dL (ref 0.3–1.2)
Total Protein: 7.3 g/dL (ref 6.5–8.1)

## 2019-11-28 LAB — CBC
HCT: 44.2 % (ref 39.0–52.0)
Hemoglobin: 14.7 g/dL (ref 13.0–17.0)
MCH: 30.2 pg (ref 26.0–34.0)
MCHC: 33.3 g/dL (ref 30.0–36.0)
MCV: 90.8 fL (ref 80.0–100.0)
Platelets: 210 10*3/uL (ref 150–400)
RBC: 4.87 MIL/uL (ref 4.22–5.81)
RDW: 13 % (ref 11.5–15.5)
WBC: 4.3 10*3/uL (ref 4.0–10.5)
nRBC: 0 % (ref 0.0–0.2)

## 2019-11-28 LAB — URINALYSIS, ROUTINE W REFLEX MICROSCOPIC
Bilirubin Urine: NEGATIVE
Glucose, UA: NEGATIVE mg/dL
Hgb urine dipstick: NEGATIVE
Ketones, ur: NEGATIVE mg/dL
Leukocytes,Ua: NEGATIVE
Nitrite: NEGATIVE
Protein, ur: NEGATIVE mg/dL
Specific Gravity, Urine: 1.02 (ref 1.005–1.030)
pH: 5 (ref 5.0–8.0)

## 2019-11-28 LAB — APTT: aPTT: 29 s (ref 24–36)

## 2019-11-28 LAB — SURGICAL PCR SCREEN
MRSA, PCR: NEGATIVE
Staphylococcus aureus: NEGATIVE

## 2019-11-28 LAB — SARS CORONAVIRUS 2 (TAT 6-24 HRS): SARS Coronavirus 2: NEGATIVE

## 2019-11-28 LAB — PROTIME-INR
INR: 1 (ref 0.8–1.2)
Prothrombin Time: 12.7 seconds (ref 11.4–15.2)

## 2019-11-28 NOTE — Progress Notes (Signed)
PCP - Pennelope Bracken (formerly Vianne Bulls. Arline Asp, MD who has since retired) Film/video editor - Denies  PPM/ICD - Denies  Chest x-ray - 11/28/19 EKG - 11/28/19 Stress Test - Per patient, > 5 years ago at the [place] as part of a physical. Results were negative. ECHO - Denies Cardiac Cath - Denies  Sleep Study - Yes, positive for OSA CPAP - No  Patient denies being a diabetic.  Blood Thinner Instructions: N/A Aspirin Instructions: Patient instructed to call Dr. Phoebe Sharps office today for instructions.  ERAS Protcol - Yes PRE-SURGERY Ensure or G2- Ensure given.  COVID TEST- 11/28/19 @ 1025 @ [facility]   Anesthesia review: No   Patient denies shortness of breath, fever, cough and chest pain at PAT appointment   All instructions explained to the patient, with a verbal understanding of the material. Patient agrees to go over the instructions while at home for a better understanding. Patient also instructed to self quarantine after being tested for COVID-19. The opportunity to ask questions was provided.

## 2019-11-29 ENCOUNTER — Telehealth: Payer: Self-pay | Admitting: Orthopaedic Surgery

## 2019-11-29 MED ORDER — BUPIVACAINE LIPOSOME 1.3 % IJ SUSP
20.0000 mL | Freq: Once | INTRAMUSCULAR | Status: DC
  Filled 2019-11-29: qty 20

## 2019-11-29 MED ORDER — TRANEXAMIC ACID 1000 MG/10ML IV SOLN
2000.0000 mg | INTRAVENOUS | Status: AC
  Administered 2019-12-02: 2000 mg via TOPICAL
  Filled 2019-11-29: qty 20

## 2019-11-29 NOTE — Telephone Encounter (Signed)
See below. Please advise. Thanks.  

## 2019-11-29 NOTE — Telephone Encounter (Signed)
Mailbox was full, couldn't leave message.

## 2019-11-29 NOTE — Telephone Encounter (Signed)
Pt called wanting to know if and when he should stop taking his asprin; before the surgery.   [phone]

## 2019-12-01 ENCOUNTER — Other Ambulatory Visit: Payer: Self-pay | Admitting: Orthopaedic Surgery

## 2019-12-01 MED ORDER — SENNOSIDES-DOCUSATE SODIUM 8.6-50 MG PO TABS: 1.0000 | ORAL_TABLET | Freq: Every evening | ORAL | 1 refills | Status: DC | PRN

## 2019-12-01 MED ORDER — ONDANSETRON HCL 4 MG PO TABS: 4.0000 mg | ORAL_TABLET | Freq: Three times a day (TID) | ORAL | 0 refills | Status: DC | PRN

## 2019-12-01 MED ORDER — ASPIRIN EC 81 MG PO TBEC: 81.0000 mg | DELAYED_RELEASE_TABLET | Freq: Two times a day (BID) | ORAL | 0 refills | Status: DC

## 2019-12-01 MED ORDER — OXYCODONE HCL 5 MG PO TABS: 5.0000 mg | ORAL_TABLET | Freq: Three times a day (TID) | ORAL | 0 refills | Status: DC | PRN

## 2019-12-01 MED ORDER — METHOCARBAMOL 750 MG PO TABS
750.0000 mg | ORAL_TABLET | Freq: Two times a day (BID) | ORAL | 3 refills | Status: DC | PRN
Start: 2019-12-01 — End: 2020-07-08

## 2019-12-01 NOTE — Discharge Instructions (Signed)

## 2019-12-02 ENCOUNTER — Observation Stay (HOSPITAL_COMMUNITY): Payer: Medicare PPO

## 2019-12-02 ENCOUNTER — Ambulatory Visit (HOSPITAL_COMMUNITY): Payer: Medicare PPO | Admitting: Anesthesiology

## 2019-12-02 ENCOUNTER — Encounter (HOSPITAL_COMMUNITY)
Admission: RE | Disposition: A | Discharge status: Home / Self Care | Payer: Self-pay | Source: Home / Self Care | Attending: Orthopaedic Surgery

## 2019-12-02 ENCOUNTER — Encounter (HOSPITAL_COMMUNITY): Payer: Self-pay | Admitting: Orthopaedic Surgery

## 2019-12-02 ENCOUNTER — Other Ambulatory Visit: Payer: Self-pay

## 2019-12-02 ENCOUNTER — Observation Stay (HOSPITAL_COMMUNITY)
Admission: RE | Admit: 2019-12-02 | Discharge: 2019-12-03 | Disposition: A | Discharge status: Home / Self Care | Payer: Medicare PPO | Attending: Orthopaedic Surgery | Admitting: Orthopaedic Surgery

## 2019-12-02 DIAGNOSIS — Z79899 Other long term (current) drug therapy: Secondary | ICD-10-CM | POA: Insufficient documentation

## 2019-12-02 DIAGNOSIS — Z87891 Personal history of nicotine dependence: Secondary | ICD-10-CM | POA: Insufficient documentation

## 2019-12-02 DIAGNOSIS — Z7982 Long term (current) use of aspirin: Secondary | ICD-10-CM | POA: Insufficient documentation

## 2019-12-02 DIAGNOSIS — Z96652 Presence of left artificial knee joint: Secondary | ICD-10-CM | POA: Diagnosis present

## 2019-12-02 DIAGNOSIS — I10 Essential (primary) hypertension: Secondary | ICD-10-CM | POA: Insufficient documentation

## 2019-12-02 DIAGNOSIS — Z8249 Family history of ischemic heart disease and other diseases of the circulatory system: Secondary | ICD-10-CM | POA: Insufficient documentation

## 2019-12-02 DIAGNOSIS — Z6835 Body mass index (BMI) 35.0-35.9, adult: Secondary | ICD-10-CM | POA: Diagnosis not present

## 2019-12-02 DIAGNOSIS — Z8042 Family history of malignant neoplasm of prostate: Secondary | ICD-10-CM | POA: Diagnosis not present

## 2019-12-02 DIAGNOSIS — E785 Hyperlipidemia, unspecified: Secondary | ICD-10-CM | POA: Insufficient documentation

## 2019-12-02 DIAGNOSIS — Z7951 Long term (current) use of inhaled steroids: Secondary | ICD-10-CM | POA: Insufficient documentation

## 2019-12-02 DIAGNOSIS — K219 Gastro-esophageal reflux disease without esophagitis: Secondary | ICD-10-CM | POA: Diagnosis not present

## 2019-12-02 DIAGNOSIS — M1712 Unilateral primary osteoarthritis, left knee: Secondary | ICD-10-CM | POA: Diagnosis not present

## 2019-12-02 DIAGNOSIS — Z801 Family history of malignant neoplasm of trachea, bronchus and lung: Secondary | ICD-10-CM | POA: Insufficient documentation

## 2019-12-02 DIAGNOSIS — G4733 Obstructive sleep apnea (adult) (pediatric): Secondary | ICD-10-CM | POA: Insufficient documentation

## 2019-12-02 DIAGNOSIS — N400 Enlarged prostate without lower urinary tract symptoms: Secondary | ICD-10-CM | POA: Insufficient documentation

## 2019-12-02 DIAGNOSIS — R519 Headache, unspecified: Secondary | ICD-10-CM | POA: Insufficient documentation

## 2019-12-02 DIAGNOSIS — Z800 Family history of malignant neoplasm of digestive organs: Secondary | ICD-10-CM | POA: Insufficient documentation

## 2019-12-02 DIAGNOSIS — J45909 Unspecified asthma, uncomplicated: Secondary | ICD-10-CM | POA: Insufficient documentation

## 2019-12-02 DIAGNOSIS — Z886 Allergy status to analgesic agent status: Secondary | ICD-10-CM | POA: Diagnosis not present

## 2019-12-02 DIAGNOSIS — Z791 Long term (current) use of non-steroidal anti-inflammatories (NSAID): Secondary | ICD-10-CM | POA: Insufficient documentation

## 2019-12-02 DIAGNOSIS — R0602 Shortness of breath: Secondary | ICD-10-CM

## 2019-12-02 HISTORY — PX: TOTAL KNEE ARTHROPLASTY: SHX125

## 2019-12-02 SURGERY — ARTHROPLASTY, KNEE, TOTAL
Anesthesia: Monitor Anesthesia Care | Site: Knee | Laterality: Left

## 2019-12-02 MED ORDER — ONDANSETRON HCL 4 MG/2ML IJ SOLN: 4.0000 mg | Freq: Four times a day (QID) | INTRAMUSCULAR | Status: DC | PRN

## 2019-12-02 MED ORDER — MAGNESIUM CITRATE PO SOLN: 1.0000 | Freq: Once | ORAL | Status: DC | PRN

## 2019-12-02 MED ORDER — MIDAZOLAM HCL 2 MG/2ML IJ SOLN: 2.0000 mg | Freq: Once | INTRAMUSCULAR | Status: DC

## 2019-12-02 MED ORDER — MONTELUKAST SODIUM 10 MG PO TABS
10.0000 mg | ORAL_TABLET | Freq: Every day | ORAL | Status: DC
  Administered 2019-12-02: 10 mg via ORAL
  Filled 2019-12-02: qty 1

## 2019-12-02 MED ORDER — BUPIVACAINE HCL (PF) 0.25 % IJ SOLN
INTRAMUSCULAR | Status: AC
  Filled 2019-12-02: qty 20

## 2019-12-02 MED ORDER — PROPOFOL 10 MG/ML IV BOLUS
INTRAVENOUS | Status: DC | PRN
  Administered 2019-12-02 (×2): 30 mg via INTRAVENOUS

## 2019-12-02 MED ORDER — ALLOPURINOL 300 MG PO TABS
300.0000 mg | ORAL_TABLET | Freq: Every day | ORAL | Status: DC
  Administered 2019-12-03: 300 mg via ORAL
  Filled 2019-12-02: qty 1

## 2019-12-02 MED ORDER — MIDAZOLAM HCL 2 MG/2ML IJ SOLN: 2.0000 mg | Freq: Once | INTRAMUSCULAR | Status: AC

## 2019-12-02 MED ORDER — ATORVASTATIN CALCIUM 80 MG PO TABS
80.0000 mg | ORAL_TABLET | Freq: Every day | ORAL | Status: DC
  Administered 2019-12-02: 80 mg via ORAL
  Filled 2019-12-02: qty 1

## 2019-12-02 MED ORDER — ALBUTEROL SULFATE (2.5 MG/3ML) 0.083% IN NEBU: 2.5000 mg | INHALATION_SOLUTION | Freq: Once | RESPIRATORY_TRACT | Status: DC

## 2019-12-02 MED ORDER — SORBITOL 70 % SOLN: 30.0000 mL | Freq: Every day | Status: DC | PRN

## 2019-12-02 MED ORDER — FENTANYL CITRATE (PF) 100 MCG/2ML IJ SOLN: 50.0000 ug | Freq: Once | INTRAMUSCULAR | Status: DC

## 2019-12-02 MED ORDER — GABAPENTIN 600 MG PO TABS
600.0000 mg | ORAL_TABLET | Freq: Three times a day (TID) | ORAL | Status: DC
  Administered 2019-12-02 – 2019-12-03 (×2): 600 mg via ORAL
  Filled 2019-12-02 (×5): qty 1

## 2019-12-02 MED ORDER — VANCOMYCIN HCL 1000 MG IV SOLR
INTRAVENOUS | Status: AC
  Filled 2019-12-02: qty 1000

## 2019-12-02 MED ORDER — ONDANSETRON HCL 4 MG PO TABS: 4.0000 mg | ORAL_TABLET | Freq: Four times a day (QID) | ORAL | Status: DC | PRN

## 2019-12-02 MED ORDER — ATENOLOL 50 MG PO TABS
50.0000 mg | ORAL_TABLET | Freq: Every morning | ORAL | Status: DC
  Administered 2019-12-03: 50 mg via ORAL
  Filled 2019-12-02: qty 1

## 2019-12-02 MED ORDER — OXYCODONE HCL 5 MG PO TABS
5.0000 mg | ORAL_TABLET | ORAL | Status: DC | PRN
  Administered 2019-12-02: 10 mg via ORAL

## 2019-12-02 MED ORDER — BUPIVACAINE IN DEXTROSE 0.75-8.25 % IT SOLN
INTRATHECAL | Status: DC | PRN
  Administered 2019-12-02: 1.6 mL via INTRATHECAL

## 2019-12-02 MED ORDER — PHENOL 1.4 % MT LIQD: 1.0000 | OROMUCOSAL | Status: DC | PRN

## 2019-12-02 MED ORDER — GABAPENTIN 300 MG PO CAPS: 300.0000 mg | ORAL_CAPSULE | Freq: Three times a day (TID) | ORAL | Status: DC

## 2019-12-02 MED ORDER — OXYCODONE HCL ER 10 MG PO T12A
10.0000 mg | EXTENDED_RELEASE_TABLET | Freq: Two times a day (BID) | ORAL | Status: DC
  Administered 2019-12-02 – 2019-12-03 (×2): 10 mg via ORAL
  Filled 2019-12-02 (×2): qty 1

## 2019-12-02 MED ORDER — MIDAZOLAM HCL 2 MG/2ML IJ SOLN
INTRAMUSCULAR | Status: AC
  Administered 2019-12-02: 2 mg via INTRAVENOUS
  Filled 2019-12-02: qty 2

## 2019-12-02 MED ORDER — VANCOMYCIN HCL 1000 MG IV SOLR
INTRAVENOUS | Status: DC | PRN
  Administered 2019-12-02: 1000 mg

## 2019-12-02 MED ORDER — FENTANYL CITRATE (PF) 100 MCG/2ML IJ SOLN
50.0000 ug | Freq: Once | INTRAMUSCULAR | Status: AC
  Administered 2019-12-02: 50 ug via INTRAVENOUS

## 2019-12-02 MED ORDER — OXYCODONE HCL 5 MG PO TABS
ORAL_TABLET | ORAL | Status: AC
  Filled 2019-12-02: qty 1

## 2019-12-02 MED ORDER — METOCLOPRAMIDE HCL 5 MG PO TABS: 5.0000 mg | ORAL_TABLET | Freq: Three times a day (TID) | ORAL | Status: DC | PRN

## 2019-12-02 MED ORDER — METOCLOPRAMIDE HCL 5 MG/ML IJ SOLN: 5.0000 mg | Freq: Three times a day (TID) | INTRAMUSCULAR | Status: DC | PRN

## 2019-12-02 MED ORDER — PROPOFOL 500 MG/50ML IV EMUL
INTRAVENOUS | Status: DC | PRN
  Administered 2019-12-02 (×2): 75 ug/kg/min via INTRAVENOUS

## 2019-12-02 MED ORDER — LIDOCAINE HCL (CARDIAC) PF 100 MG/5ML IV SOSY
PREFILLED_SYRINGE | INTRAVENOUS | Status: DC | PRN
  Administered 2019-12-02: 100 mg via INTRAVENOUS

## 2019-12-02 MED ORDER — DIPHENHYDRAMINE HCL 12.5 MG/5ML PO ELIX: 25.0000 mg | ORAL_SOLUTION | ORAL | Status: DC | PRN

## 2019-12-02 MED ORDER — PHENYLEPHRINE HCL-NACL 10-0.9 MG/250ML-% IV SOLN
INTRAVENOUS | Status: DC | PRN
  Administered 2019-12-02: 30 ug/min via INTRAVENOUS

## 2019-12-02 MED ORDER — ALUM & MAG HYDROXIDE-SIMETH 200-200-20 MG/5ML PO SUSP: 30.0000 mL | ORAL | Status: DC | PRN

## 2019-12-02 MED ORDER — CEFAZOLIN SODIUM-DEXTROSE 2-4 GM/100ML-% IV SOLN
2.0000 g | Freq: Four times a day (QID) | INTRAVENOUS | Status: AC
  Administered 2019-12-02 – 2019-12-03 (×3): 2 g via INTRAVENOUS
  Filled 2019-12-02 (×3): qty 100

## 2019-12-02 MED ORDER — CEFAZOLIN SODIUM-DEXTROSE 2-3 GM-%(50ML) IV SOLR
INTRAVENOUS | Status: DC | PRN
Start: 2019-12-02 — End: 2019-12-02
  Administered 2019-12-02: 2 g via INTRAVENOUS

## 2019-12-02 MED ORDER — 0.9 % SODIUM CHLORIDE (POUR BTL) OPTIME
TOPICAL | Status: DC | PRN
  Administered 2019-12-02: 1000 mL

## 2019-12-02 MED ORDER — CELECOXIB 200 MG PO CAPS
200.0000 mg | ORAL_CAPSULE | Freq: Two times a day (BID) | ORAL | Status: DC
  Administered 2019-12-02 – 2019-12-03 (×2): 200 mg via ORAL
  Filled 2019-12-02 (×3): qty 1

## 2019-12-02 MED ORDER — POLYETHYLENE GLYCOL 3350 17 G PO PACK: 17.0000 g | PACK | Freq: Every day | ORAL | Status: DC | PRN

## 2019-12-02 MED ORDER — OXYCODONE HCL 5 MG PO TABS: 10.0000 mg | ORAL_TABLET | ORAL | Status: DC | PRN

## 2019-12-02 MED ORDER — ACETAMINOPHEN 500 MG PO TABS
1000.0000 mg | ORAL_TABLET | Freq: Once | ORAL | Status: AC
  Administered 2019-12-02: 1000 mg via ORAL
  Filled 2019-12-02: qty 2

## 2019-12-02 MED ORDER — SODIUM CHLORIDE 0.9% FLUSH
INTRAVENOUS | Status: DC | PRN
  Administered 2019-12-02: 20 mL via INTRAVENOUS

## 2019-12-02 MED ORDER — KETOROLAC TROMETHAMINE 15 MG/ML IJ SOLN: 30.0000 mg | Freq: Four times a day (QID) | INTRAMUSCULAR | Status: DC

## 2019-12-02 MED ORDER — ASPIRIN 81 MG PO CHEW
81.0000 mg | CHEWABLE_TABLET | Freq: Two times a day (BID) | ORAL | Status: DC
  Administered 2019-12-02 – 2019-12-03 (×2): 81 mg via ORAL
  Filled 2019-12-02 (×2): qty 1

## 2019-12-02 MED ORDER — ACETAMINOPHEN 325 MG PO TABS: 325.0000 mg | ORAL_TABLET | Freq: Four times a day (QID) | ORAL | Status: DC | PRN

## 2019-12-02 MED ORDER — SODIUM CHLORIDE 0.9 % IV SOLN: INTRAVENOUS | Status: DC

## 2019-12-02 MED ORDER — DOCUSATE SODIUM 100 MG PO CAPS
100.0000 mg | ORAL_CAPSULE | Freq: Two times a day (BID) | ORAL | Status: DC
  Administered 2019-12-02 – 2019-12-03 (×2): 100 mg via ORAL
  Filled 2019-12-02 (×2): qty 1

## 2019-12-02 MED ORDER — DEXAMETHASONE SODIUM PHOSPHATE 10 MG/ML IJ SOLN
10.0000 mg | Freq: Once | INTRAMUSCULAR | Status: AC
  Administered 2019-12-03: 10 mg via INTRAVENOUS
  Filled 2019-12-02: qty 1

## 2019-12-02 MED ORDER — LACTATED RINGERS IV SOLN: INTRAVENOUS | Status: DC

## 2019-12-02 MED ORDER — POVIDONE-IODINE 10 % EX SWAB
2.0000 "application " | Freq: Once | CUTANEOUS | Status: AC
  Administered 2019-12-02: 2 via TOPICAL

## 2019-12-02 MED ORDER — FENTANYL CITRATE (PF) 100 MCG/2ML IJ SOLN
INTRAMUSCULAR | Status: AC
  Filled 2019-12-02: qty 2

## 2019-12-02 MED ORDER — ACETAMINOPHEN 500 MG PO TABS
1000.0000 mg | ORAL_TABLET | Freq: Four times a day (QID) | ORAL | Status: DC
  Administered 2019-12-02 – 2019-12-03 (×3): 1000 mg via ORAL
  Filled 2019-12-02 (×3): qty 2

## 2019-12-02 MED ORDER — BUPIVACAINE-EPINEPHRINE 0.25% -1:200000 IJ SOLN
INTRAMUSCULAR | Status: DC | PRN
  Administered 2019-12-02: 20 mL

## 2019-12-02 MED ORDER — IRRISEPT - 450ML BOTTLE WITH 0.05% CHG IN STERILE WATER, USP 99.95% OPTIME
TOPICAL | Status: DC | PRN
  Administered 2019-12-02: 450 mL via TOPICAL

## 2019-12-02 MED ORDER — ORAL CARE MOUTH RINSE: 15.0000 mL | Freq: Once | OROMUCOSAL | Status: AC

## 2019-12-02 MED ORDER — CEFAZOLIN SODIUM-DEXTROSE 2-4 GM/100ML-% IV SOLN
2.0000 g | INTRAVENOUS | Status: DC
  Filled 2019-12-02: qty 100

## 2019-12-02 MED ORDER — ONDANSETRON HCL 4 MG/2ML IJ SOLN
INTRAMUSCULAR | Status: DC | PRN
  Administered 2019-12-02: 4 mg via INTRAVENOUS

## 2019-12-02 MED ORDER — DULOXETINE HCL 60 MG PO CPEP
60.0000 mg | ORAL_CAPSULE | Freq: Every day | ORAL | Status: DC
  Administered 2019-12-02 – 2019-12-03 (×2): 60 mg via ORAL
  Filled 2019-12-02 (×2): qty 1

## 2019-12-02 MED ORDER — CHLORHEXIDINE GLUCONATE 0.12 % MT SOLN
15.0000 mL | Freq: Once | OROMUCOSAL | Status: AC
  Administered 2019-12-02: 15 mL via OROMUCOSAL
  Filled 2019-12-02: qty 15

## 2019-12-02 MED ORDER — HYDROMORPHONE HCL 1 MG/ML IJ SOLN
0.5000 mg | INTRAMUSCULAR | Status: DC | PRN
  Administered 2019-12-02 – 2019-12-03 (×2): 1 mg via INTRAVENOUS
  Filled 2019-12-02 (×2): qty 1

## 2019-12-02 MED ORDER — TRANEXAMIC ACID-NACL 1000-0.7 MG/100ML-% IV SOLN
INTRAVENOUS | Status: AC
  Filled 2019-12-02: qty 100

## 2019-12-02 MED ORDER — MENTHOL 3 MG MT LOZG: 1.0000 | LOZENGE | OROMUCOSAL | Status: DC | PRN

## 2019-12-02 MED ORDER — SODIUM CHLORIDE 0.9 % IR SOLN
Status: DC | PRN
  Administered 2019-12-02: 3000 mL

## 2019-12-02 MED ORDER — TRANEXAMIC ACID-NACL 1000-0.7 MG/100ML-% IV SOLN
1000.0000 mg | INTRAVENOUS | Status: AC
  Administered 2019-12-02: 1000 mg via INTRAVENOUS

## 2019-12-02 MED ORDER — HYDROMORPHONE HCL 1 MG/ML IJ SOLN: 0.2500 mg | INTRAMUSCULAR | Status: DC | PRN

## 2019-12-02 SURGICAL SUPPLY — 81 items
ALCOHOL 70% 16 OZ (MISCELLANEOUS) ×3 IMPLANT
BAG DECANTER FOR FLEXI CONT (MISCELLANEOUS) ×3 IMPLANT
BANDAGE ESMARK 6X9 LF (GAUZE/BANDAGES/DRESSINGS) IMPLANT
BLADE SAG 18X100X1.27 (BLADE) ×3 IMPLANT
BNDG ELASTIC 6X10 VLCR STRL LF (GAUZE/BANDAGES/DRESSINGS) ×2 IMPLANT
BNDG ESMARK 6X9 LF (GAUZE/BANDAGES/DRESSINGS)
BOWL SMART MIX CTS (DISPOSABLE) ×3 IMPLANT
CEMENT BONE REFOBACIN R1X40 US (Cement) ×4 IMPLANT
CLOSURE STERI-STRIP 1/2X4 (GAUZE/BANDAGES/DRESSINGS) ×2
CLSR STERI-STRIP ANTIMIC 1/2X4 (GAUZE/BANDAGES/DRESSINGS) ×4 IMPLANT
COMP FEM CEMT PERSONA SZ9 LT (Knees) ×3 IMPLANT
COMPONENT FEM CEMT PRNSA SZ9LT (Knees) IMPLANT
COVER SURGICAL LIGHT HANDLE (MISCELLANEOUS) ×3 IMPLANT
COVER WAND RF STERILE (DRAPES) IMPLANT
CUFF TOURN SGL QUICK 34 (TOURNIQUET CUFF) ×3
CUFF TOURN SGL QUICK 42 (TOURNIQUET CUFF) IMPLANT
CUFF TRNQT CYL 34X4.125X (TOURNIQUET CUFF) ×1 IMPLANT
DERMABOND ADHESIVE PROPEN (GAUZE/BANDAGES/DRESSINGS) ×2
DERMABOND ADVANCED (GAUZE/BANDAGES/DRESSINGS) ×2
DERMABOND ADVANCED .7 DNX12 (GAUZE/BANDAGES/DRESSINGS) ×1 IMPLANT
DERMABOND ADVANCED .7 DNX6 (GAUZE/BANDAGES/DRESSINGS) IMPLANT
DRAPE EXTREMITY T 121X128X90 (DISPOSABLE) ×3 IMPLANT
DRAPE HALF SHEET 40X57 (DRAPES) ×3 IMPLANT
DRAPE INCISE IOBAN 66X45 STRL (DRAPES) IMPLANT
DRAPE ORTHO SPLIT 77X108 STRL (DRAPES) ×6
DRAPE POUCH INSTRU U-SHP 10X18 (DRAPES) ×3 IMPLANT
DRAPE SURG ORHT 6 SPLT 77X108 (DRAPES) ×2 IMPLANT
DRAPE U-SHAPE 47X51 STRL (DRAPES) ×6 IMPLANT
DRESSING AQUACEL AG SP 3.5X10 (GAUZE/BANDAGES/DRESSINGS) IMPLANT
DRSG AQUACEL AG ADV 3.5X10 (GAUZE/BANDAGES/DRESSINGS) ×3 IMPLANT
DRSG AQUACEL AG SP 3.5X10 (GAUZE/BANDAGES/DRESSINGS) ×3
DURAPREP 26ML APPLICATOR (WOUND CARE) ×9 IMPLANT
ELECT CAUTERY BLADE 6.4 (BLADE) ×3 IMPLANT
ELECT REM PT RETURN 9FT ADLT (ELECTROSURGICAL) ×3
ELECTRODE REM PT RTRN 9FT ADLT (ELECTROSURGICAL) ×1 IMPLANT
GLOVE BIOGEL PI IND STRL 7.0 (GLOVE) ×1 IMPLANT
GLOVE BIOGEL PI INDICATOR 7.0 (GLOVE) ×2
GLOVE ECLIPSE 7.0 STRL STRAW (GLOVE) ×9 IMPLANT
GLOVE SKINSENSE NS SZ7.5 (GLOVE) ×6
GLOVE SKINSENSE STRL SZ7.5 (GLOVE) ×3 IMPLANT
GLOVE SURG SYN 7.5  E (GLOVE) ×12
GLOVE SURG SYN 7.5 E (GLOVE) ×4 IMPLANT
GLOVE SURG SYN 7.5 PF PI (GLOVE) ×4 IMPLANT
GOWN STRL REIN XL XLG (GOWN DISPOSABLE) ×3 IMPLANT
GOWN STRL REUS W/ TWL LRG LVL3 (GOWN DISPOSABLE) ×1 IMPLANT
GOWN STRL REUS W/TWL LRG LVL3 (GOWN DISPOSABLE) ×3
HANDPIECE INTERPULSE COAX TIP (DISPOSABLE) ×3
HOOD PEEL AWAY FLYTE STAYCOOL (MISCELLANEOUS) ×6 IMPLANT
JET LAVAGE IRRISEPT WOUND (IRRIGATION / IRRIGATOR) ×3
KIT BASIN OR (CUSTOM PROCEDURE TRAY) ×3 IMPLANT
KIT TURNOVER KIT B (KITS) ×3 IMPLANT
LAVAGE JET IRRISEPT WOUND (IRRIGATION / IRRIGATOR) ×1 IMPLANT
MANIFOLD NEPTUNE II (INSTRUMENTS) ×3 IMPLANT
MARKER SKIN DUAL TIP RULER LAB (MISCELLANEOUS) ×3 IMPLANT
NDL SPNL 18GX3.5 QUINCKE PK (NEEDLE) ×2 IMPLANT
NEEDLE SPNL 18GX3.5 QUINCKE PK (NEEDLE) ×6 IMPLANT
NS IRRIG 1000ML POUR BTL (IV SOLUTION) ×3 IMPLANT
PACK TOTAL JOINT (CUSTOM PROCEDURE TRAY) ×3 IMPLANT
PAD ARMBOARD 7.5X6 YLW CONV (MISCELLANEOUS) ×6 IMPLANT
SAW OSC TIP CART 19.5X105X1.3 (SAW) ×3 IMPLANT
SET HNDPC FAN SPRY TIP SCT (DISPOSABLE) ×1 IMPLANT
STAPLER VISISTAT 35W (STAPLE) IMPLANT
STEM POLY PAT PLY 35M KNEE (Knees) ×2 IMPLANT
STEM TIBIA 5 DEG SZ F L KNEE (Knees) IMPLANT
STEM TIBIAL 10 8-11 EF POLY LT (Joint) ×2 IMPLANT
SUCTION FRAZIER HANDLE 10FR (MISCELLANEOUS) ×3
SUCTION TUBE FRAZIER 10FR DISP (MISCELLANEOUS) ×1 IMPLANT
SUT ETHILON 2 0 FS 18 (SUTURE) IMPLANT
SUT MNCRL AB 4-0 PS2 18 (SUTURE) IMPLANT
SUT VIC AB 0 CT1 27 (SUTURE) ×6
SUT VIC AB 0 CT1 27XBRD ANBCTR (SUTURE) ×2 IMPLANT
SUT VIC AB 1 CTX 27 (SUTURE) ×9 IMPLANT
SUT VIC AB 2-0 CT1 27 (SUTURE) ×12
SUT VIC AB 2-0 CT1 TAPERPNT 27 (SUTURE) ×4 IMPLANT
SYR 50ML LL SCALE MARK (SYRINGE) ×6 IMPLANT
TIBIA STEM 5 DEG SZ F L KNEE (Knees) ×3 IMPLANT
TOWEL GREEN STERILE (TOWEL DISPOSABLE) ×3 IMPLANT
TOWEL GREEN STERILE FF (TOWEL DISPOSABLE) ×3 IMPLANT
TRAY CATH 16FR W/PLASTIC CATH (SET/KITS/TRAYS/PACK) IMPLANT
UNDERPAD 30X36 HEAVY ABSORB (UNDERPADS AND DIAPERS) ×3 IMPLANT
WRAP KNEE MAXI GEL POST OP (GAUZE/BANDAGES/DRESSINGS) ×3 IMPLANT

## 2019-12-02 NOTE — Evaluation (Signed)
Physical Therapy Evaluation Patient Details Name: Douglas Carpenter MRN: [id number] DOB: Aug 01, 1955 Today's Date: 12/02/2019   History of Present Illness  Pt is a 64 y/o male s/p L TKA. PMH includes HTN, sleep apnea.   Clinical Impression  Pt s/p surgery above with deficits below. Tolerated gait training well to chair this session. Required min guard A for mobility using RW. Educated about knee precautions. Will continue to follow acutely to maximize functional mobility independence and safety.      Follow Up Recommendations Follow surgeon's recommendation for DC plan and follow-up therapies;Supervision for mobility/OOB    Equipment Recommendations  Rolling walker with 5" wheels;3in1 (PT)    Recommendations for Other Services       Precautions / Restrictions Precautions Precautions: Knee Precaution Booklet Issued: No Precaution Comments: Verbally reviewed knee precautions.  Restrictions Weight Bearing Restrictions: Yes LLE Weight Bearing: Weight bearing as tolerated      Mobility  Bed Mobility Overal bed mobility: Needs Assistance Bed Mobility: Supine to Sit     Supine to sit: Supervision     General bed mobility comments: Supervision for safety.   Transfers Overall transfer level: Needs assistance Equipment used: Rolling walker (2 wheeled) Transfers: Sit to/from Stand Sit to Stand: Min guard         General transfer comment: Min guard for steadying assist. Demonstrated safe hand placement.   Ambulation/Gait Ambulation/Gait assistance: Min guard Gait Distance (Feet): 5 Feet Assistive device: Rolling walker (2 wheeled) Gait Pattern/deviations: Step-to pattern;Decreased step length - right;Decreased step length - left;Decreased weight shift to left;Antalgic Gait velocity: Decreased   General Gait Details: Slow, antalgic gait. Cues for sequencing using RW. Pt's dinner in room, so mobility limited to chair.   Stairs            Wheelchair Mobility     Modified Rankin (Stroke Patients Only)       Balance Overall balance assessment: Needs assistance Sitting-balance support: No upper extremity supported;Feet supported Sitting balance-Leahy Scale: Good     Standing balance support: Bilateral upper extremity supported;During functional activity Standing balance-Leahy Scale: Poor Standing balance comment: Reliant on BUE support                              Pertinent Vitals/Pain Pain Assessment: Faces Faces Pain Scale: Hurts little more Pain Location: L knee Pain Descriptors / Indicators: Aching;Operative site guarding Pain Intervention(s): Limited activity within patient's tolerance;Monitored during session;Repositioned    Home Living Family/patient expects to be discharged to:: Private residence Living Arrangements: Spouse/significant other Available Help at Discharge: Family;Available 24 hours/day Type of Home: House Home Access: Stairs to enter Entrance Stairs-Rails: Left Entrance Stairs-Number of Steps: 3 Home Layout: One level Home Equipment: None      Prior Function Level of Independence: Independent         Comments: Retired Regulatory affairs officer        Extremity/Trunk Assessment   Upper Extremity Assessment Upper Extremity Assessment: Overall WFL for tasks assessed    Lower Extremity Assessment Lower Extremity Assessment: LLE deficits/detail LLE Deficits / Details: Deficits consistent with post op pain and weakness. Reports some numbness.     Cervical / Trunk Assessment Cervical / Trunk Assessment: Normal  Communication   Communication: No difficulties  Cognition Arousal/Alertness: Awake/alert Behavior During Therapy: WFL for tasks assessed/performed Overall Cognitive Status: Within Functional Limits for tasks assessed  General Comments General comments (skin integrity, edema, etc.): Pt's wife present during session.      Exercises Total Joint Exercises Ankle Circles/Pumps: AROM;Both;20 reps;Supine   Assessment/Plan    PT Assessment Patient needs continued PT services  PT Problem List Decreased strength;Decreased activity tolerance;Decreased range of motion;Decreased balance;Decreased mobility;Decreased knowledge of use of DME;Decreased knowledge of precautions;Pain       PT Treatment Interventions Gait training;DME instruction;Functional mobility training;Stair training;Therapeutic activities;Therapeutic exercise;Balance training;Patient/family education    PT Goals (Current goals can be found in the Care Plan section)  Acute Rehab PT Goals Patient Stated Goal: to go home PT Goal Formulation: With patient Time For Goal Achievement: 12/16/19 Potential to Achieve Goals: Good    Frequency 7X/week   Barriers to discharge        Co-evaluation               AM-PAC PT "6 Clicks" Mobility  Outcome Measure Help needed turning from your back to your side while in a flat bed without using bedrails?: None Help needed moving from lying on your back to sitting on the side of a flat bed without using bedrails?: None Help needed moving to and from a bed to a chair (including a wheelchair)?: A Little Help needed standing up from a chair using your arms (e.g., wheelchair or bedside chair)?: A Little Help needed to walk in hospital room?: A Little Help needed climbing 3-5 steps with a railing? : A Little 6 Click Score: 20    End of Session Equipment Utilized During Treatment: Gait belt Activity Tolerance: Patient tolerated treatment well Patient left: in chair;with call bell/phone within reach Nurse Communication: Mobility status PT Visit Diagnosis: Other abnormalities of gait and mobility (R26.89);Pain Pain - Right/Left: Left Pain - part of body: Knee    Time: 7106-2694 PT Time Calculation (min) (ACUTE ONLY): 27 min   Charges:   PT Evaluation $PT Eval Low Complexity: 1 Low PT Treatments  $Therapeutic Activity: 8-22 mins        Lou Miner, DPT  Acute Rehabilitation Services  Pager: [phone] Office: [phone]   Rudean Hitt 12/02/2019, 6:15 PM

## 2019-12-02 NOTE — Plan of Care (Signed)

## 2019-12-02 NOTE — Transfer of Care (Signed)
Immediate Anesthesia Transfer of Care Note  Patient: Douglas Carpenter  Procedure(s) Performed: LEFT TOTAL KNEE ARTHROPLASTY (Left Knee)  Patient Location: PACU  Anesthesia Type:MAC, Spinal and MAC combined with regional for post-op pain  Level of Consciousness: sedated  Airway & Oxygen Therapy: Patient Spontanous Breathing  Post-op Assessment: Report given to RN, Post -op Vital signs reviewed and stable, Post -op Vital signs reviewed and unstable, Anesthesiologist notified and Patient moving all extremities  Post vital signs: Reviewed and stable  Last Vitals:  Vitals Value Taken Time  BP 127/81 12/02/19 1323  Temp    Pulse 69 12/02/19 1324  Resp 19 12/02/19 1324  SpO2 96 % 12/02/19 1324  Vitals shown include unvalidated device data.  Last Pain:  Vitals:   12/02/19 0945  TempSrc:   PainSc: 0-No pain      Patients Stated Pain Goal: 2 (88/32/54 9826)  Complications: No apparent anesthesia complications

## 2019-12-02 NOTE — Op Note (Signed)
Total Knee Arthroplasty Procedure Note  Preoperative diagnosis: Left knee osteoarthritis  Postoperative diagnosis:same  Operative procedure: Left total knee arthroplasty. CPT [id number]  Surgeon: N. Eduard Roux, MD  Assistant Surgeon: Anderson Malta, MD necessary for the timely completion of the surgery  Anesthesia: Spinal, regional, local  Tourniquet time: see anesthesia record  Implants used: Zimmer persona Femur: CR 9 Tibia: F Patella: 35 mm Polyethylene: 10 mm, MC  Indication: Douglas Carpenter is a 64 y.o. year old male with a history of knee pain. Having failed conservative management, the patient elected to proceed with a total knee arthroplasty.  We have reviewed the risk and benefits of the surgery and they elected to proceed after voicing understanding.  Procedure:  After informed consent was obtained and understanding of the risk were voiced including but not limited to bleeding, infection, damage to surrounding structures including nerves and vessels, blood clots, leg length inequality and the failure to achieve desired results, the operative extremity was marked with verbal confirmation of the patient in the holding area.   The patient was then brought to the operating room and transported to the operating room table in the supine position.  A tourniquet was applied to the operative extremity around the upper thigh. The operative limb was then prepped and draped in the usual sterile fashion and preoperative antibiotics were administered.  A time out was performed prior to the start of surgery confirming the correct extremity, preoperative antibiotic administration, as well as team members, implants and instruments available for the case. Correct surgical site was also confirmed with preoperative radiographs. The limb was then elevated for exsanguination and the tourniquet was inflated. A midline incision was made and a standard medial parapatellar approach was performed.  The  infrapatellar fat pad was removed.  Suprapatellar synovium was removed to reveal the anterior distal femoral cortex.  A medial peel was performed to release the capsule of the medial tibial plateau.  The patella was then everted and was prepared and sized to a 35 mm.  A cover was placed on the patella for protection from retractors.  The knee was then brought into full flexion and we then turned our attention to the femur.  The cruciates were sacrificed.  Start site was drilled in the femur and the intramedullary distal femoral cutting guide was placed, set at 5 degrees valgus, taking 10 mm of distal resection. The distal cut was made. Osteophytes were then removed.  Next, the proximal tibial cutting guide was placed with appropriate slope, varus/valgus alignment and depth of resection. The proximal tibial cut was made. Gap blocks were then used to assess the extension gap and alignment, and appropriate soft tissue releases were performed. Attention was turned back to the femur, which was sized using the sizing guide to a size 9. Appropriate rotation of the femoral component was determined using epicondylar axis, Whiteside's line, and assessing the flexion gap under ligament tension. The appropriate size 4-in-1 cutting block was placed and checked with an angel wing and cuts were made. Posterior femoral osteophytes and uncapped bone were then removed with the curved osteotome.  Trial components were placed, and stability was checked in full extension, mid-flexion, and deep flexion. Proper tibial rotation was determined and marked.  The patella tracked well without a lateral release.  The femoral lugs were then drilled. Trial components were then removed and tibial preparation performed.  The tibia was sized for a size F component.  A posterior capsular injection comprising of 20  cc of 1.3% exparel, 20 cc of 0.25% bupivicaine with epi and 20 cc of normal saline was performed for postoperative pain control. The  bony surfaces were irrigated with a pulse lavage and then dried. Bone cement was vacuum mixed on the back table, and the final components sized above were cemented into place.  Antibiotic irrigation was placed in the knee joint and soft tissues while the cement cured.  After cement had finished curing, excess cement was removed. The stability of the construct was re-evaluated throughout a range of motion and found to be acceptable. The trial liner was removed, the knee was copiously irrigated, and the knee was re-evaluated for any excess bone debris. The real polyethylene liner, 10 mm thick, was inserted and checked to ensure the locking mechanism had engaged appropriately. The tourniquet was deflated and hemostasis was achieved. The wound was irrigated with normal saline.  One gram of vancomycin powder was placed in the surgical bed.  Capsular closure was performed with a #1 vicryl, subcutaneous fat closed with a 0 vicryl suture, then subcutaneous tissue closed with interrupted 2.0 vicryl suture. The skin was then closed with a 3.0 monocryl and steri strips. A sterile dressing was applied.  The patient was awakened in the operating room and taken to recovery in stable condition. All sponge, needle, and instrument counts were correct at the end of the case.  Position: supine  Complications: none.  Time Out: performed   Drains/Packing: none  Estimated blood loss: minimal  Returned to Recovery Room: in good condition.   Antibiotics: yes   Mechanical VTE (DVT) Prophylaxis: sequential compression devices, TED thigh-high  Chemical VTE (DVT) Prophylaxis: aspirin  Fluid Replacement  Crystalloid: see anesthesia record Blood: none  FFP: none   Specimens Removed: 1 to pathology   Sponge and Instrument Count Correct? yes   PACU: portable radiograph - knee AP and Lateral   Plan/RTC: Return in 2 weeks for wound check.   Weight Bearing/Load Lower Extremity: full   N. Eduard Roux, MD [facility] 12:31 PM

## 2019-12-02 NOTE — Progress Notes (Signed)
Orthopedic Tech Progress Note Patient Details:  Douglas Carpenter 11-18-1955 [id number]  CPM Left Knee CPM Left Knee: On Left Knee Flexion (Degrees): 0 Left Knee Extension (Degrees): 90  Post Interventions Patient Tolerated: Well Instructions Provided: Other (comment)  [name] 12/02/2019, 2:17 PM

## 2019-12-02 NOTE — Plan of Care (Signed)
  Problem: Education: Goal: Knowledge of General Education information will improve Description: Including pain rating scale, medication(s)/side effects and non-pharmacologic comfort measures Outcome: Progressing   Problem: Activity: Goal: Risk for activity intolerance will decrease Outcome: Progressing   Problem: Pain Managment: Goal: General experience of comfort will improve Outcome: Progressing   

## 2019-12-02 NOTE — Anesthesia Postprocedure Evaluation (Signed)
Anesthesia Post Note  Patient: Douglas Carpenter  Procedure(s) Performed: LEFT TOTAL KNEE ARTHROPLASTY (Left Knee)     Patient location during evaluation: PACU Anesthesia Type: MAC, Spinal and Regional Level of consciousness: oriented and awake and alert Pain management: pain level controlled Vital Signs Assessment: post-procedure vital signs reviewed and stable Respiratory status: spontaneous breathing and respiratory function stable Cardiovascular status: blood pressure returned to baseline and stable Postop Assessment: no headache, no backache, no apparent nausea or vomiting, spinal receding and patient able to bend at knees Anesthetic complications: no    Last Vitals:  Vitals:   12/02/19 1440 12/02/19 1455  BP: (!) 144/79 (!) 141/97  Pulse: (!) 54 (!) 53  Resp: 10 11  Temp:  36.7 C  SpO2: 100% 98%    Last Pain:  Vitals:   12/02/19 1455  TempSrc:   PainSc: 0-No pain                 Shoni Quijas,W. EDMOND

## 2019-12-02 NOTE — Anesthesia Procedure Notes (Signed)
Anesthesia Regional Block: Adductor canal block   Pre-Anesthetic Checklist: ,, timeout performed, Correct Patient, Correct Site, Correct Laterality, Correct Procedure, Correct Position, site marked, Risks and benefits discussed, pre-op evaluation,  At surgeon's request and post-op pain management  Laterality: Left  Prep: Maximum Sterile Barrier Precautions used, chloraprep       Needles:  Injection technique: Single-shot  Needle Type: Echogenic Stimulator Needle     Needle Length: 9cm  Needle Gauge: 21     Additional Needles:   Procedures:,,,, ultrasound used (permanent image in chart),,,,  Narrative:  Start time: 12/02/2019 9:20 AM End time: 12/02/2019 9:30 AM Injection made incrementally with aspirations every 5 mL.  Performed by: Personally  Anesthesiologist: Roderic Palau, MD  Additional Notes: 2% Lidocaine skin wheel.

## 2019-12-02 NOTE — H&P (Signed)
PREOPERATIVE H&P  Chief Complaint: left knee degnerative joint disease  HPI: Douglas Carpenter is a 64 y.o. male who presents for surgical treatment of left knee degnerative joint disease.  He denies any changes in medical history.  Past Medical History:  Diagnosis Date  . Allergy   . Arthritis    "everywhere"  . BPH (benign prostatic hypertrophy)   . Chronic rhinitis 01/27/2009  . ED (erectile dysfunction)   . GERD (gastroesophageal reflux disease)   . Headache(784.0)    migraines, as many as 3 in a week    . HYPERLIPIDEMIA 01/27/2009  . HYPERTENSION 01/27/2009  . Mild asthma    seasonal allergies, uses Proair once per yr.   . Mild obstructive sleep apnea    per pt -- mild osa test result--  no rx cpap needed  . Neuromuscular disorder ([place])   . Sleep apnea    no cpap  . Testosterone deficiency   . Urethral stricture   . Vocal cord anomaly    pt states told from [place]  doctor heard a little gurgle sound -- no farther testing done   Past Surgical History:  Procedure Laterality Date  . BACK SURGERY    . COLONOSCOPY    . CYSTOSCOPY WITH URETHRAL DILATATION N/A 01/07/2013   Procedure: CYSTOSCOPY WITH URETHRAL DILATATION BALLOON DILATION ;  Surgeon: Bernestine Amass, MD;  Location: [facility];  Service: Urology;  Laterality: N/A;  . LUMBAR LAMINECTOMY/DECOMPRESSION MICRODISCECTOMY Right 11/29/2012   Procedure: Right Lumbar Four to Five Laminectomy/ Decompression Microdiskectomy;  Surgeon: Otilio Connors, MD;  Location: [facility];  Service: Neurosurgery;  Laterality: Right;  LUMBAR LAMINECTOMY/DECOMPRESSION MICRODISCECTOMY 1 LEVEL  . POLYPECTOMY    . SHOULDER ARTHROSCOPY W/ ACROMIAL REPAIR Left 08-28-2002  . UPPER GASTROINTESTINAL ENDOSCOPY     Social History   Socioeconomic History  . Marital status: Married    Spouse name: Not on file  . Number of children: Not on file  . Years of education: Not on file  . Highest education level: Not on file  Occupational  History  . Occupation: retired  Tobacco Use  . Smoking status: Former Smoker    Packs/day: 0.50    Years: 22.00    Pack years: 11.00    Types: Cigarettes    Quit date: 06/27/1988    Years since quitting: 31.4  . Smokeless tobacco: Never Used  Substance and Sexual Activity  . Alcohol use: Yes    Alcohol/week: 3.0 standard drinks    Types: 3 Standard drinks or equivalent per week    Comment: 4-5 drinks / day- cognac   . Drug use: No  . Sexual activity: Not on file  Other Topics Concern  . Not on file  Social History Narrative  . Not on file   Social Determinants of Health   Financial Resource Strain:   . Difficulty of Paying Living Expenses:   Food Insecurity:   . Worried About Charity fundraiser in the Last Year:   . Arboriculturist in the Last Year:   Transportation Needs:   . Film/video editor (Medical):   Marland Kitchen Lack of Transportation (Non-Medical):   Physical Activity:   . Days of Exercise per Week:   . Minutes of Exercise per Session:   Stress:   . Feeling of Stress :   Social Connections:   . Frequency of Communication with Friends and Family:   . Frequency of Social Gatherings with Friends  and Family:   . Attends Religious Services:   . Active Member of Clubs or Organizations:   . Attends Archivist Meetings:   Marland Kitchen Marital Status:    Family History  Problem Relation Age of Onset  . Cancer Mother        lung  . Lung cancer Mother   . Heart disease Maternal Grandmother   . Heart disease Maternal Grandfather   . Cancer Maternal Grandfather        colon, prostate,lung  . Colon cancer Neg Hx   . Colon polyps Neg Hx   . Esophageal cancer Neg Hx   . Rectal cancer Neg Hx   . Stomach cancer Neg Hx    Allergies  Allergen Reactions  . Aspirin Rash    Pt can tolerate low doses-- INTOLERANT TO HIGHER DOSES   Prior to Admission medications   Medication Sig Start Date End Date Taking? Authorizing Provider  albuterol (PROAIR HFA) 108 (90 Base) MCG/ACT  inhaler Inhale 2 puffs into the lungs every 6 (six) hours as needed for wheezing or shortness of breath. 04/18/18  Yes [name], MD  allopurinol (ZYLOPRIM) 300 MG tablet Take 1 tablet (300 mg total) by mouth daily. 12/05/18  Yes Aundra Dubin, PA-C  aspirin EC 81 MG tablet Take 81 mg by mouth every morning.   Yes [provider]  atenolol (TENORMIN) 50 MG tablet Take 50 mg by mouth every morning.    Yes [provider]  atorvastatin (LIPITOR) 80 MG tablet Take 80 mg by mouth at bedtime.  08/20/19  Yes [provider]  diclofenac (VOLTAREN) 75 MG EC tablet Take 75 mg by mouth daily.    Yes [provider]  DULoxetine (CYMBALTA) 60 MG capsule Take 1 capsule (60 mg total) by mouth daily. 11/13/17  Yes Wendie Agreste, MD  Erenumab-aooe (AIMOVIG) 70 MG/ML SOAJ Inject 70 mg into the skin every 30 (thirty) days.   Yes [provider]  fluticasone-salmeterol (ADVAIR HFA) 230-21 MCG/ACT inhaler Inhale 2 puffs into the lungs 2 (two) times daily. 04/18/18  Yes [name], MD  gabapentin (NEURONTIN) 600 MG tablet Take 600 mg by mouth 3 (three) times daily.  09/18/19  Yes [provider]  meloxicam (MOBIC) 15 MG tablet Take 15 mg by mouth daily.  09/20/19  Yes [provider]  montelukast (SINGULAIR) 10 MG tablet Take 1 tablet (10 mg total) by mouth at bedtime. 04/03/17  Yes [name], MD  Omega-3 Fatty Acids (FISH OIL) 1000 MG CPDR Take 1,000 mg by mouth 2 (two) times daily.    Yes [provider]  omeprazole (PRILOSEC) 40 MG capsule Take 40 mg by mouth daily.  08/21/19  Yes [provider]  aspirin EC 81 MG tablet Take 1 tablet (81 mg total) by mouth 2 (two) times daily. 12/01/19   Leandrew Koyanagi, MD  cyclobenzaprine (FLEXERIL) 10 MG tablet Take 0.5-1 tablets (5-10 mg total) by mouth 3 (three) times daily as needed for muscle spasms. Patient not taking: Reported on 11/21/2019 11/08/13   Jovita Gamma, MD  methocarbamol  (ROBAXIN) 750 MG tablet Take 1 tablet (750 mg total) by mouth 2 (two) times daily as needed for muscle spasms. 12/01/19   Leandrew Koyanagi, MD  olopatadine (PATADAY) 0.1 % ophthalmic solution Place 1 drop into both eyes 2 (two) times daily. Patient not taking: Reported on 11/21/2019 10/15/18   Ok Edwards, PA-C  ondansetron (ZOFRAN) 4 MG tablet Take 1-2 tablets (  4-8 mg total) by mouth every 8 (eight) hours as needed for nausea or vomiting. 12/01/19   Leandrew Koyanagi, MD  oxyCODONE (OXY IR/ROXICODONE) 5 MG immediate release tablet Take 1-2 tablets (5-10 mg total) by mouth every 8 (eight) hours as needed for severe pain. 12/01/19   Leandrew Koyanagi, MD  senna-docusate (SENOKOT S) 8.6-50 MG tablet Take 1-2 tablets by mouth at bedtime as needed. 12/01/19   Leandrew Koyanagi, MD  triamcinolone cream (KENALOG) 0.1 % Apply 1 application topically 3 (three) times daily. As needed to hand. Patient not taking: Reported on 11/21/2019 11/20/18   Wendie Agreste, MD     Positive ROS: All other systems have been reviewed and were otherwise negative with the exception of those mentioned in the HPI and as above.  Physical Exam: General: Alert, no acute distress Cardiovascular: No pedal edema Respiratory: No cyanosis, no use of accessory musculature GI: abdomen soft Skin: No lesions in the area of chief complaint Neurologic: Sensation intact distally Psychiatric: Patient is competent for consent with normal mood and affect Lymphatic: no lymphedema  MUSCULOSKELETAL: exam stable  Assessment: left knee degnerative joint disease  Plan: Plan for Procedure(s): LEFT TOTAL KNEE ARTHROPLASTY  The risks benefits and alternatives were discussed with the patient including but not limited to the risks of nonoperative treatment, versus surgical intervention including infection, bleeding, nerve injury,  blood clots, cardiopulmonary complications, morbidity, mortality, among others, and they were willing to proceed.   Preoperative  templating of the joint replacement has been completed, documented, and submitted to the Operating Room personnel in order to optimize intra-operative equipment management.   Eduard Roux, MD 12/02/2019 7:28 AM

## 2019-12-02 NOTE — Anesthesia Procedure Notes (Signed)
Spinal  Patient location during procedure: OR Start time: 12/02/2019 10:36 AM End time: 12/02/2019 10:40 AM Staffing Performed: anesthesiologist  Anesthesiologist: Roderic Palau, MD Preanesthetic Checklist Completed: patient identified, IV checked, risks and benefits discussed, surgical consent, monitors and equipment checked, pre-op evaluation and timeout performed Spinal Block Patient position: sitting Prep: DuraPrep Patient monitoring: cardiac monitor, continuous pulse ox and blood pressure Approach: midline Location: L2-3 Injection technique: single-shot Needle Needle type: Pencan  Needle gauge: 24 G Needle length: 9 cm Assessment Sensory level: T8 Additional Notes Functioning IV was confirmed and monitors were applied. Sterile prep and drape, including hand hygiene and sterile gloves were used. The patient was positioned and the spine was prepped. The skin was anesthetized with lidocaine.  Free flow of clear CSF was obtained prior to injecting local anesthetic into the CSF.  The spinal needle aspirated freely following injection.  The needle was carefully withdrawn.  The patient tolerated the procedure well.

## 2019-12-02 NOTE — Anesthesia Preprocedure Evaluation (Addendum)
Anesthesia Evaluation  Patient identified by MRN, date of birth, ID band Patient awake    Reviewed: Allergy & Precautions, H&P , NPO status , Patient's Chart, lab work & pertinent test results  Airway Mallampati: III  TM Distance: >3 FB Neck ROM: Full    Dental no notable dental hx. (+) Teeth Intact, Dental Advisory Given   Pulmonary asthma , sleep apnea , former smoker,    Pulmonary exam normal breath sounds clear to auscultation       Cardiovascular hypertension, Pt. on medications  Rhythm:Regular Rate:Normal     Neuro/Psych  Headaches, negative psych ROS   GI/Hepatic Neg liver ROS, GERD  Medicated,  Endo/Other  Morbid obesity  Renal/GU negative Renal ROS  negative genitourinary   Musculoskeletal  (+) Arthritis , Osteoarthritis,    Abdominal   Peds  Hematology negative hematology ROS (+)   Anesthesia Other Findings   Reproductive/Obstetrics negative OB ROS                            Anesthesia Physical Anesthesia Plan  ASA: III  Anesthesia Plan: Spinal and MAC   Post-op Pain Management:  Regional for Post-op pain   Induction: Intravenous  PONV Risk Score and Plan: 2 and Propofol infusion, Ondansetron and Midazolam  Airway Management Planned: Simple Face Mask  Additional Equipment:   Intra-op Plan:   Post-operative Plan:   Informed Consent: I have reviewed the patients History and Physical, chart, labs and discussed the procedure including the risks, benefits and alternatives for the proposed anesthesia with the patient or authorized representative who has indicated his/her understanding and acceptance.     Dental advisory given  Plan Discussed with: CRNA  Anesthesia Plan Comments:         Anesthesia Quick Evaluation

## 2019-12-03 ENCOUNTER — Encounter (HOSPITAL_COMMUNITY): Payer: Self-pay | Admitting: Orthopaedic Surgery

## 2019-12-03 DIAGNOSIS — M1712 Unilateral primary osteoarthritis, left knee: Secondary | ICD-10-CM | POA: Diagnosis not present

## 2019-12-03 LAB — CBC
HCT: 42.2 % (ref 39.0–52.0)
Hemoglobin: 13.9 g/dL (ref 13.0–17.0)
MCH: 30.5 pg (ref 26.0–34.0)
MCHC: 32.9 g/dL (ref 30.0–36.0)
MCV: 92.5 fL (ref 80.0–100.0)
Platelets: 211 10*3/uL (ref 150–400)
RBC: 4.56 MIL/uL (ref 4.22–5.81)
RDW: 13.1 % (ref 11.5–15.5)
WBC: 6.4 10*3/uL (ref 4.0–10.5)
nRBC: 0 % (ref 0.0–0.2)

## 2019-12-03 LAB — BASIC METABOLIC PANEL
Anion gap: 11 (ref 5–15)
BUN: 9 mg/dL (ref 8–23)
CO2: 26 mmol/L (ref 22–32)
Calcium: 8.5 mg/dL — ABNORMAL LOW (ref 8.9–10.3)
Chloride: 101 mmol/L (ref 98–111)
Creatinine, Ser: 1.12 mg/dL (ref 0.61–1.24)
GFR calc Af Amer: >60 mL/min (ref >60)
GFR calc non Af Amer: >60 mL/min (ref >60)
Glucose, Bld: 141 mg/dL — ABNORMAL HIGH (ref 70–99)
Potassium: 3.5 mmol/L (ref 3.5–5.1)
Sodium: 138 mmol/L (ref 135–145)

## 2019-12-03 NOTE — TOC Initial Note (Signed)
Transition of Care [facility]) - Initial/Assessment Note    Patient Details  Name: Douglas Carpenter MRN: [id number] Date of Birth: 12-28-1955  [facility] CM/SW Contact:    Curlene Labrum, RN Phone Number: 12/03/2019, 1:57 PM  Clinical Narrative:                 Case management met with the patient and his wife at the bedside regarding transitions for care for home.  [facility].  The patient is S/P Left total knee arthroplasty with Dr. Erlinda Hong.  I called [employer] and they are contacting the wife for CPM delivery.  Patient was given medicare choice and patient did not have a preference and was set up with [facility] for [facility].  [employer] called and 3:1 and RW delivered to the home prior to discharge.  Expected Discharge Plan: [place] Barriers to Discharge: No Barriers Identified   Patient Goals and CMS Choice Patient states their goals for this hospitalization and ongoing recovery are:: I did well with my surgery and look forward to going home. CMS Medicare.gov Compare Post Acute Care list provided to:: Patient Choice offered to / list presented to : Patient  Expected Discharge Plan and Services Expected Discharge Plan: [place]   Discharge Planning Services: CM Consult Post Acute Care Choice: Durable Medical Equipment, Home Health Living arrangements for the past 2 months: Single Family Home Expected Discharge Date: 12/03/19               DME Arranged: 3-N-1, Walker rolling, Tub bench DME Agency: [employer], [employer](Called [employer] at [phone] and left a message with Ruby Cola regarding patient's possible need for CPM) Date DME Agency Contacted: 12/03/19 Time DME Agency Contacted: 1232 Representative spoke with at DME Agency: [place], [place]: PT [place] Agency: [facility] (formerly [employer]) Date [place]: 12/03/19 Time [place]:  1237 Representative spoke with at [place]: Jonelle Sidle, [facility] [place] Arrangements/Services Living arrangements for the past 2 months: [place] with:: Spouse Patient language and need for interpreter reviewed:: Yes Do you feel safe going back to the place where you live?: Yes      Need for Family Participation in Patient Care: Yes (Comment) Care giver support system in place?: Yes (comment)   Criminal Activity/Legal Involvement Pertinent to Current Situation/Hospitalization: No - Comment as needed  Activities of Daily Living Home Assistive Devices/Equipment: Brace (specify type), Nebulizer(back brace) ADL Screening (condition at time of admission) Patient's cognitive ability adequate to safely complete daily activities?: Yes Is the patient deaf or have difficulty hearing?: No Does the patient have difficulty seeing, even when wearing glasses/contacts?: No Does the patient have difficulty concentrating, remembering, or making decisions?: No Patient able to express need for assistance with ADLs?: Yes Does the patient have difficulty dressing or bathing?: No Independently performs ADLs?: Yes (appropriate for developmental age) Does the patient have difficulty walking or climbing stairs?: Yes Weakness of Legs: Left Weakness of Arms/Hands: None  Permission Sought/Granted Permission sought to share information with : Case Manager Permission granted to share information with : Yes, Verbal Permission Granted     Permission granted to share info w AGENCY: [facility] notified, [facility] for equipment, [facility].        Emotional Assessment Appearance:: Appears stated age Attitude/Demeanor/Rapport: Engaged Affect (typically observed): Accepting Orientation: : Oriented to  Self, Oriented to Place, Oriented to  Time, Oriented to Situation Alcohol / Substance Use: Not Applicable Psych Involvement: No (comment)  Admission diagnosis:  Status post total left knee  replacement [Z96.652] Patient Active Problem List   Diagnosis Date Noted  . Status post total left knee replacement 12/02/2019  . Primary osteoarthritis of left knee 04/16/2019  . Primary osteoarthritis of right knee 02/20/2019  . Exercise-induced asthma 07/21/2014  . Chronic cough 07/21/2014  . Upper airway cough syndrome 07/21/2014  . Allergic rhinitis 07/21/2014  . Lumbar stenosis 11/06/2013  . Urethral stricture 01/07/2013  . Prostatic hypertrophy 11/12/2012  . VOCAL CORD DISORDER 02/19/2009  . HYPERLIPIDEMIA 01/27/2009  . Snoring 01/27/2009  . HYPERTENSION 01/27/2009  . CHRONIC RHINITIS 01/27/2009  . Asthma 01/27/2009  . GERD 01/27/2009   PCP:  Rocco Serene, MD Pharmacy:   [facility] [address] [place] [address] Phone: [phone] Fax: [phone]     Social Determinants of Health (SDOH) Interventions    Readmission Risk Interventions No flowsheet data found.

## 2019-12-03 NOTE — Progress Notes (Signed)
   Subjective:  Patient reports pain as mild.    Objective:   VITALS:   Vitals:   12/02/19 1645 12/02/19 1743 12/02/19 2043 12/03/19 0500  BP: (!) 169/93 (!) 150/93 (!) 160/91 134/85  Pulse: 60 69 70 80  Resp: 20 16 17 17   Temp: 98.3 F (36.8 C) 98.3 F (36.8 C) 97.9 F (36.6 C) 97.8 F (36.6 C)  TempSrc:  Oral Oral Oral  SpO2: 98% 99% 96% 94%  Weight:      Height:        Neurovascular intact Sensation intact distally Intact pulses distally Dorsiflexion/Plantar flexion intact Incision: dressing C/D/I and no drainage   Lab Results  Component Value Date   WBC 6.4 12/03/2019   HGB 13.9 12/03/2019   HCT 42.2 12/03/2019   MCV 92.5 12/03/2019   PLT 211 12/03/2019     Assessment/Plan:  1 Day Post-Op   - Expected postop acute blood loss anemia - will monitor for symptoms - Up with PT/OT - did very well, anticipate clearing today - DVT ppx - SCDs, ambulation, aspirin - WBAT operative extremity - Pain control - Discharge planning - home today after PT today  Eduard Roux 12/03/2019, 7:36 AM [phone]

## 2019-12-03 NOTE — Progress Notes (Signed)
Physical Therapy Treatment Patient Details Name: Douglas Carpenter MRN: [id number] DOB: [birth year] Today's Date: 12/03/2019    History of Present Illness Pt is a 64 y/o male s/p L TKA. PMH includes HTN, sleep apnea.     PT Comments    Pt progressing well with mobility, ambulated 250' with RW and supervision working on progressing to step through gait pattern. Pt completed there ex in supine and sitting and was able to perform bed mobility and transfers without physical assist. Will plan to see one more time today for ambulation and stairs before d/c home.      Follow Up Recommendations  Follow surgeon's recommendation for DC plan and follow-up therapies;Supervision for mobility/OOB     Equipment Recommendations  Rolling walker with 5" wheels;3in1 (PT)    Recommendations for Other Services       Precautions / Restrictions Precautions Precautions: Knee Precaution Booklet Issued: Yes (comment) Precaution Comments: reviewed knee precautions, exercise handout given Restrictions Weight Bearing Restrictions: Yes LLE Weight Bearing: Weight bearing as tolerated    Mobility  Bed Mobility Overal bed mobility: Needs Assistance Bed Mobility: Supine to Sit     Supine to sit: Supervision     General bed mobility comments: vc's for rolling R and sitting up, pt able to complete task from flat bed without use of rails  Transfers Overall transfer level: Needs assistance Equipment used: Rolling walker (2 wheeled) Transfers: Sit to/from Stand Sit to Stand: Supervision         General transfer comment: vc's for hand placement and control with descent to sitting  Ambulation/Gait Ambulation/Gait assistance: Supervision Gait Distance (Feet): 250 Feet Assistive device: Rolling walker (2 wheeled) Gait Pattern/deviations: Step-to pattern;Decreased step length - right;Decreased step length - left;Decreased weight shift to left;Antalgic Gait velocity: Decreased Gait velocity interpretation:  <1.8 ft/sec, indicate of risk for recurrent falls General Gait Details: beginning to work on step through pattern and shoulder depression during gait   Stairs             Wheelchair Mobility    Modified Rankin (Stroke Patients Only)       Balance Overall balance assessment: Needs assistance Sitting-balance support: No upper extremity supported;Feet supported Sitting balance-Leahy Scale: Good     Standing balance support: Bilateral upper extremity supported;During functional activity Standing balance-Leahy Scale: Good Standing balance comment: pt able to stand to pull up shorts without UE support as well as use bathroom                            Cognition Arousal/Alertness: Awake/alert Behavior During Therapy: WFL for tasks assessed/performed Overall Cognitive Status: Within Functional Limits for tasks assessed                                        Exercises Total Joint Exercises Ankle Circles/Pumps: AROM;Both;20 reps;Supine Quad Sets: AROM;Both;10 reps;Supine Short Arc Quad: AROM;Left;10 reps;Supine Heel Slides: AROM;Left;10 reps;Supine Hip ABduction/ADduction: AAROM;Left;10 reps;Supine Straight Leg Raises: AAROM;Left;10 reps;Supine Long Arc Quad: AROM;Left;10 reps;Seated Knee Flexion: AROM;Left;10 reps;Seated Goniometric ROM: 10-75    General Comments General comments (skin integrity, edema, etc.): reviewed use of CPM      Pertinent Vitals/Pain Pain Assessment: Faces Faces Pain Scale: Hurts little more Pain Location: L knee Pain Descriptors / Indicators: Aching;Operative site guarding Pain Intervention(s): Limited activity within patient's tolerance;Monitored during session    Home Living  Prior Function            PT Goals (current goals can now be found in the care plan section) Acute Rehab PT Goals Patient Stated Goal: to go home PT Goal Formulation: With patient Time For Goal  Achievement: 12/16/19 Potential to Achieve Goals: Good Progress towards PT goals: Progressing toward goals    Frequency    7X/week      PT Plan Current plan remains appropriate    Co-evaluation              AM-PAC PT "6 Clicks" Mobility   Outcome Measure  Help needed turning from your back to your side while in a flat bed without using bedrails?: None Help needed moving from lying on your back to sitting on the side of a flat bed without using bedrails?: None Help needed moving to and from a bed to a chair (including a wheelchair)?: A Little Help needed standing up from a chair using your arms (e.g., wheelchair or bedside chair)?: A Little Help needed to walk in hospital room?: A Little Help needed climbing 3-5 steps with a railing? : A Little 6 Click Score: 20    End of Session   Activity Tolerance: Patient tolerated treatment well Patient left: in chair;with call bell/phone within reach;with family/visitor present Nurse Communication: Mobility status PT Visit Diagnosis: Other abnormalities of gait and mobility (R26.89);Pain Pain - Right/Left: Left Pain - part of body: Knee     Time: 9728-2060 PT Time Calculation (min) (ACUTE ONLY): 45 min  Charges:  $Gait Training: 8-22 mins $Therapeutic Exercise: 8-22 mins $Therapeutic Activity: 8-22 mins                     [name], Hernandez  Pager [phone] Office [place] 12/03/2019, 9:22 AM

## 2019-12-03 NOTE — Progress Notes (Signed)
Physical Therapy Treatment and Discharge Patient Details Name: Douglas Carpenter MRN: [id number] DOB: [birth year] Today's Date: 12/03/2019    History of Present Illness Pt is a 64 y/o male s/p L TKA. PMH includes HTN, sleep apnea.     PT Comments    Pt ambulated 500' with RW as well as practicing single step with RW which is what he will have to navigate at home. Pt has met acute goals and is ready for d/c home from a mobility standpoint. Discussed car transfer, CPM use, and there ex. Pt left in CPM 0-85 deg. No more acute needs at this time.    Follow Up Recommendations  Follow surgeon's recommendation for DC plan and follow-up therapies;Supervision for mobility/OOB     Equipment Recommendations  Rolling walker with 5" wheels;3in1 (PT)    Recommendations for Other Services       Precautions / Restrictions Precautions Precautions: Knee Precaution Booklet Issued: No Precaution Comments: pt able to verbalize proper positioning Restrictions Weight Bearing Restrictions: Yes LLE Weight Bearing: Weight bearing as tolerated    Mobility  Bed Mobility Overal bed mobility: Needs Assistance Bed Mobility: Supine to Sit     Supine to sit: Supervision     General bed mobility comments: pt in chair  Transfers Overall transfer level: Modified independent Equipment used: Rolling walker (2 wheeled) Transfers: Sit to/from Stand Sit to Stand: Modified independent (Device/Increase time)         General transfer comment: pt with safe standing and much better control with descent. Mod I  Ambulation/Gait Ambulation/Gait assistance: Modified independent (Device/Increase time) Gait Distance (Feet): 500 Feet Assistive device: Rolling walker (2 wheeled) Gait Pattern/deviations: Decreased step length - right;Decreased weight shift to left;Antalgic;Step-through pattern Gait velocity: Decreased Gait velocity interpretation: 1.31 - 2.62 ft/sec, indicative of limited community ambulator General  Gait Details: pt able to assume step through gait pattern and less antalgia noted than in AM. Pt safe with use of RW   Stairs Stairs: Yes Stairs assistance: Supervision Stair Management: No rails;Forwards;With walker Number of Stairs: 1(2x) General stair comments: Pt able to navigate single step with use of RW, wife present for training   Wheelchair Mobility    Modified Rankin (Stroke Patients Only)       Balance Overall balance assessment: Needs assistance Sitting-balance support: No upper extremity supported;Feet supported Sitting balance-Leahy Scale: Good     Standing balance support: Bilateral upper extremity supported;During functional activity Standing balance-Leahy Scale: Good Standing balance comment: pt able to stand to pull up shorts without UE support as well as use bathroom                            Cognition Arousal/Alertness: Awake/alert Behavior During Therapy: WFL for tasks assessed/performed Overall Cognitive Status: Within Functional Limits for tasks assessed                                        Exercises Total Joint Exercises Ankle Circles/Pumps: AROM;Both;20 reps;Supine Quad Sets: AROM;Both;10 reps;Supine Short Arc Quad: AROM;Left;10 reps;Supine Heel Slides: AROM;Left;10 reps;Supine Hip ABduction/ADduction: AAROM;Left;10 reps;Supine Straight Leg Raises: AAROM;Left;10 reps;Supine Long Arc Quad: AROM;Left;10 reps;Seated Knee Flexion: AROM;Left;10 reps;Seated Goniometric ROM: 10-75    General Comments General comments (skin integrity, edema, etc.): discussed car transfer      Pertinent Vitals/Pain Pain Assessment: Faces Faces Pain Scale: Hurts little more Pain Location: L  knee Pain Descriptors / Indicators: Aching;Operative site guarding Pain Intervention(s): Limited activity within patient's tolerance;Monitored during session    Home Living Family/patient expects to be discharged to:: Private residence Living  Arrangements: Spouse/significant other                  Prior Function            PT Goals (current goals can now be found in the care plan section) Acute Rehab PT Goals Patient Stated Goal: to go home PT Goal Formulation: With patient Time For Goal Achievement: 12/16/19 Potential to Achieve Goals: Good Progress towards PT goals: Goals met/education completed, patient discharged from PT    Frequency    7X/week      PT Plan Current plan remains appropriate    Co-evaluation              AM-PAC PT "6 Clicks" Mobility   Outcome Measure  Help needed turning from your back to your side while in a flat bed without using bedrails?: None Help needed moving from lying on your back to sitting on the side of a flat bed without using bedrails?: None Help needed moving to and from a bed to a chair (including a wheelchair)?: None Help needed standing up from a chair using your arms (e.g., wheelchair or bedside chair)?: None Help needed to walk in hospital room?: None Help needed climbing 3-5 steps with a railing? : A Little 6 Click Score: 23    End of Session   Activity Tolerance: Patient tolerated treatment well Patient left: in chair;with call bell/phone within reach;with family/visitor present Nurse Communication: Mobility status PT Visit Diagnosis: Other abnormalities of gait and mobility (R26.89);Pain Pain - Right/Left: Left Pain - part of body: Knee     Time: 4782-9562 PT Time Calculation (min) (ACUTE ONLY): 25 min  Charges:  $Gait Training: 23-37 mins $Therapeutic Exercise: 8-22 mins $Therapeutic Activity: 8-22 mins                     [place]  Pager [phone] Office [place] 12/03/2019, 1:08 PM

## 2019-12-03 NOTE — Care Management Obs Status (Signed)
[facility] NOTIFICATION   Patient Details  Name: Douglas Carpenter MRN: [id number] Date of Birth: July 27, 1955   Medicare Observation Status Notification Given:  Yes    Curlene Labrum, RN 12/03/2019, 2:04 PM

## 2019-12-03 NOTE — Discharge Summary (Signed)
Patient ID: Douglas Carpenter MRN: [id number] DOB/AGE: 1956/06/03 64 y.o.  Admit date: 12/02/2019 Discharge date: 12/03/2019  Admission Diagnoses:  Primary osteoarthritis of left knee  Discharge Diagnoses:  Principal Problem:   Primary osteoarthritis of left knee Active Problems:   Status post total left knee replacement   Past Medical History:  Diagnosis Date  . Allergy   . Arthritis    "everywhere"  . BPH (benign prostatic hypertrophy)   . Chronic rhinitis 01/27/2009  . ED (erectile dysfunction)   . GERD (gastroesophageal reflux disease)   . Headache(784.0)    migraines, as many as 3 in a week    . HYPERLIPIDEMIA 01/27/2009  . HYPERTENSION 01/27/2009  . Mild asthma    seasonal allergies, uses Proair once per yr.   . Mild obstructive sleep apnea    per pt -- mild osa test result--  no rx cpap needed  . Neuromuscular disorder ([place])   . Sleep apnea    no cpap  . Testosterone deficiency   . Urethral stricture   . Vocal cord anomaly    pt states told from [place]  doctor heard a little gurgle sound -- no farther testing done    Surgeries: Procedure(s): LEFT TOTAL KNEE ARTHROPLASTY on 12/02/2019   Consultants (if any):   Discharged Condition: Improved  Hospital Course: Douglas Carpenter is an 64 y.o. male who was admitted 12/02/2019 with a diagnosis of Primary osteoarthritis of left knee and went to the operating room on 12/02/2019 and underwent the above named procedures.    He was given perioperative antibiotics:  Anti-infectives (From admission, onward)   Start     Dose/Rate Route Frequency Ordered Stop   12/02/19 1730  ceFAZolin (ANCEF) IVPB 2g/100 mL premix     2 g 200 mL/hr over 30 Minutes Intravenous Every 6 hours 12/02/19 1716 12/03/19 0633   12/02/19 1122  vancomycin (VANCOCIN) powder  Status:  Discontinued       As needed 12/02/19 1123 12/02/19 1319   12/02/19 0845  ceFAZolin (ANCEF) IVPB 2g/100 mL premix  Status:  Discontinued     2 g 200 mL/hr over 30 Minutes  Intravenous On call to O.R. 12/02/19 0830 12/02/19 1712    .  He was given sequential compression devices, early ambulation, and appropriate chemoprophylaxis for DVT prophylaxis.  He benefited maximally from the hospital stay and there were no complications.    Recent vital signs:  Vitals:   12/02/19 2043 12/03/19 0500  BP: (!) 160/91 134/85  Pulse: 70 80  Resp: 17 17  Temp: 97.9 F (36.6 C) 97.8 F (36.6 C)  SpO2: 96% 94%    Recent laboratory studies:  Lab Results  Component Value Date   HGB 13.9 12/03/2019   HGB 14.7 11/28/2019   HGB 14.0 10/15/2018   Lab Results  Component Value Date   WBC 6.4 12/03/2019   PLT 211 12/03/2019   Lab Results  Component Value Date   INR 1.0 11/28/2019   Lab Results  Component Value Date   NA 138 12/03/2019   K 3.5 12/03/2019   CL 101 12/03/2019   CO2 26 12/03/2019   BUN 9 12/03/2019   CREATININE 1.12 12/03/2019   GLUCOSE 141 (H) 12/03/2019    Discharge Medications:   Allergies as of 12/03/2019      Reactions   Aspirin Rash   Pt can tolerate low doses-- INTOLERANT TO HIGHER DOSES      Medication List    TAKE these  medications   Aimovig 70 MG/ML Soaj Generic drug: Erenumab-aooe Inject 70 mg into the skin every 30 (thirty) days.   albuterol 108 (90 Base) MCG/ACT inhaler Commonly known as: ProAir HFA Inhale 2 puffs into the lungs every 6 (six) hours as needed for wheezing or shortness of breath.   allopurinol 300 MG tablet Commonly known as: ZYLOPRIM Take 1 tablet (300 mg total) by mouth daily.   aspirin EC 81 MG tablet Take 1 tablet (81 mg total) by mouth 2 (two) times daily.   aspirin EC 81 MG tablet Take 81 mg by mouth every morning.   atenolol 50 MG tablet Commonly known as: TENORMIN Take 50 mg by mouth every morning.   atorvastatin 80 MG tablet Commonly known as: LIPITOR Take 80 mg by mouth at bedtime.   cyclobenzaprine 10 MG tablet Commonly known as: FLEXERIL Take 0.5-1 tablets (5-10 mg total) by  mouth 3 (three) times daily as needed for muscle spasms.   diclofenac 75 MG EC tablet Commonly known as: VOLTAREN Take 75 mg by mouth daily.   DULoxetine 60 MG capsule Commonly known as: Cymbalta Take 1 capsule (60 mg total) by mouth daily.   Fish Oil 1000 MG Cpdr Take 1,000 mg by mouth 2 (two) times daily.   fluticasone-salmeterol 230-21 MCG/ACT inhaler Commonly known as: Advair HFA Inhale 2 puffs into the lungs 2 (two) times daily.   gabapentin 600 MG tablet Commonly known as: NEURONTIN Take 600 mg by mouth 3 (three) times daily.   meloxicam 15 MG tablet Commonly known as: MOBIC Take 15 mg by mouth daily.   methocarbamol 750 MG tablet Commonly known as: ROBAXIN Take 1 tablet (750 mg total) by mouth 2 (two) times daily as needed for muscle spasms.   montelukast 10 MG tablet Commonly known as: SINGULAIR Take 1 tablet (10 mg total) by mouth at bedtime.   olopatadine 0.1 % ophthalmic solution Commonly known as: Pataday Place 1 drop into both eyes 2 (two) times daily.   omeprazole 40 MG capsule Commonly known as: PRILOSEC Take 40 mg by mouth daily.   ondansetron 4 MG tablet Commonly known as: ZOFRAN Take 1-2 tablets (4-8 mg total) by mouth every 8 (eight) hours as needed for nausea or vomiting.   oxyCODONE 5 MG immediate release tablet Commonly known as: Oxy IR/ROXICODONE Take 1-2 tablets (5-10 mg total) by mouth every 8 (eight) hours as needed for severe pain.   senna-docusate 8.6-50 MG tablet Commonly known as: Senokot S Take 1-2 tablets by mouth at bedtime as needed.   triamcinolone cream 0.1 % Commonly known as: KENALOG Apply 1 application topically 3 (three) times daily. As needed to hand.            Durable Medical Equipment  (From admission, onward)         Start     Ordered   12/02/19 1717  DME Walker rolling  Once    Question:  Patient needs a walker to treat with the following condition  Answer:  Total knee replacement status   12/02/19  1716   12/02/19 1717  DME 3 n 1  Once     12/02/19 1716   12/02/19 1717  DME Bedside commode  Once    Question:  Patient needs a bedside commode to treat with the following condition  Answer:  Total knee replacement status   12/02/19 1716          Diagnostic Studies: DG Chest 2 View  Result Date: 11/28/2019 CLINICAL  DATA:  Preop left knee replacement. EXAM: CHEST - 2 VIEW COMPARISON:  Radiograph 08/08/2014 FINDINGS: The cardiomediastinal contours are normal. Incidental azygos fissure again seen. Subsegmental atelectasis or scarring in the lingula. Pulmonary vasculature is normal. No consolidation, pleural effusion, or pneumothorax. No acute osseous abnormalities are seen. IMPRESSION: Subsegmental atelectasis or scarring in the lingula. Otherwise negative radiographs of the chest. Electronically Signed   By: Keith Rake M.D.   On: 11/28/2019 15:20   DG Knee Left Port  Result Date: 12/02/2019 CLINICAL DATA:  64 year old male status post left knee arthroplasty. EXAM: PORTABLE LEFT KNEE - 1-2 VIEW COMPARISON:  08/01/2019. FINDINGS: Portable AP and Reisz-table lateral views at 1447 hours. Total knee arthroplasty hardware is in place and normally aligned. No adverse hardware features. Small volume postoperative gas and fluid in the joint space. Postoperative changes to the patella. No unexpected osseous changes. IMPRESSION: Left total knee arthroplasty with no adverse features. Electronically Signed   By: Genevie Ann M.D.   On: 12/02/2019 15:25    Disposition: Discharge disposition: 01-Home or Self Care       Discharge Instructions    Call MD / Call 911   Complete by: As directed    If you experience chest pain or shortness of breath, CALL 911 and be transported to the hospital emergency room.  If you develope a fever above 101.5 F, pus (white drainage) or increased drainage or redness at the wound, or calf pain, call your surgeon's office.   Constipation Prevention   Complete by: As  directed    Drink plenty of fluids.  Prune juice may be helpful.  You may use a stool softener, such as Colace (over the counter) 100 mg twice a day.  Use MiraLax (over the counter) for constipation as needed.   Driving restrictions   Complete by: As directed    No driving while taking narcotic pain meds.   Increase activity slowly as tolerated   Complete by: As directed       Follow-up Information    Leandrew Koyanagi, MD In 2 weeks.   Specialty: Orthopedic Surgery Why: For suture removal, For wound re-check Contact information: [place] [id number] [phone]            Signed: Eduard Roux 12/03/2019, 7:37 AM

## 2019-12-03 NOTE — Progress Notes (Signed)
Patient discharging home. Discharge instructions explained to patient and he verbalized understanding. Took all personal belongings. TED hose donned. No further questions or concerns voiced.

## 2019-12-10 ENCOUNTER — Telehealth: Payer: Self-pay

## 2019-12-10 ENCOUNTER — Telehealth: Payer: Self-pay | Admitting: Orthopaedic Surgery

## 2019-12-10 NOTE — Telephone Encounter (Signed)
yes

## 2019-12-10 NOTE — Telephone Encounter (Signed)
Patient would like to know if it is okay for him to receive his migraine injection being that he just had a left TKA on Monday, 12/02/2019?  CB# [phone].  Please advise.  Thank you.

## 2019-12-11 NOTE — Telephone Encounter (Signed)
Tried calling back [phone] no answer first time so I tried home #. Some kid answered I asked to speak to [facility] and they started yelling stop calling this is a scam. I stated that I was calling from Dr Phoebe Sharps office, [facility]. States that no you are a scammer. Was not able to get in touch with [name]. Will call tomorrow.

## 2019-12-12 NOTE — Telephone Encounter (Signed)
Tried calling again, no answer. Unable to LM due to mailbox being full.

## 2019-12-17 ENCOUNTER — Ambulatory Visit (INDEPENDENT_AMBULATORY_CARE_PROVIDER_SITE_OTHER): Payer: Medicare PPO | Admitting: Orthopaedic Surgery

## 2019-12-17 ENCOUNTER — Encounter: Payer: Self-pay | Admitting: Orthopaedic Surgery

## 2019-12-17 DIAGNOSIS — Z96652 Presence of left artificial knee joint: Secondary | ICD-10-CM

## 2019-12-17 NOTE — Progress Notes (Signed)
Post-Op Visit Note   Patient: Douglas Carpenter           Date of Birth: [birth year]           MRN: [id number] Visit Date: 12/17/2019 PCP: Rocco Serene, MD   Assessment & Plan:  Chief Complaint:  Chief Complaint  Patient presents with  . Left Knee - Routine Post Op   Visit Diagnoses:  1. Status post total left knee replacement     Plan: Douglas Carpenter is 2 weeks status post left total knee replacement.  He is doing very well overall.  He is progressing extremely well with home health PT.  Range of motion is 0 to 114 degrees.  Surgical incision is healed.  Minimal swelling.  At this point we will send a referral to outpatient PT at [facility].  He does not need a refill on any medications currently.  Already ambulating with a single-point cane.  Recheck in 4 weeks with two-view x-rays of the left knee.  Follow-Up Instructions: Return in about 4 weeks (around 01/14/2020).   Orders:  Orders Placed This Encounter  Procedures  . Ambulatory referral to Physical Therapy   No orders of the defined types were placed in this encounter.   Imaging: No results found.  PMFS History: Patient Active Problem List   Diagnosis Date Noted  . Status post total left knee replacement 12/02/2019  . Primary osteoarthritis of left knee 04/16/2019  . Primary osteoarthritis of right knee 02/20/2019  . Exercise-induced asthma 07/21/2014  . Chronic cough 07/21/2014  . Upper airway cough syndrome 07/21/2014  . Allergic rhinitis 07/21/2014  . Lumbar stenosis 11/06/2013  . Urethral stricture 01/07/2013  . Prostatic hypertrophy 11/12/2012  . VOCAL CORD DISORDER 02/19/2009  . HYPERLIPIDEMIA 01/27/2009  . Snoring 01/27/2009  . HYPERTENSION 01/27/2009  . CHRONIC RHINITIS 01/27/2009  . Asthma 01/27/2009  . GERD 01/27/2009   Past Medical History:  Diagnosis Date  . Allergy   . Arthritis    "everywhere"  . BPH (benign prostatic hypertrophy)   . Chronic rhinitis 01/27/2009  . ED (erectile dysfunction)   .  GERD (gastroesophageal reflux disease)   . Headache(784.0)    migraines, as many as 3 in a week    . HYPERLIPIDEMIA 01/27/2009  . HYPERTENSION 01/27/2009  . Mild asthma    seasonal allergies, uses Proair once per yr.   . Mild obstructive sleep apnea    per pt -- mild osa test result--  no rx cpap needed  . Neuromuscular disorder ([place])   . Sleep apnea    no cpap  . Testosterone deficiency   . Urethral stricture   . Vocal cord anomaly    pt states told from [place]  doctor heard a little gurgle sound -- no farther testing done    Family History  Problem Relation Age of Onset  . Cancer Mother        lung  . Lung cancer Mother   . Heart disease Maternal Grandmother   . Heart disease Maternal Grandfather   . Cancer Maternal Grandfather        colon, prostate,lung  . Colon cancer Neg Hx   . Colon polyps Neg Hx   . Esophageal cancer Neg Hx   . Rectal cancer Neg Hx   . Stomach cancer Neg Hx     Past Surgical History:  Procedure Laterality Date  . BACK SURGERY    . COLONOSCOPY    . CYSTOSCOPY WITH URETHRAL DILATATION N/A 01/07/2013  Procedure: CYSTOSCOPY WITH URETHRAL DILATATION BALLOON DILATION ;  Surgeon: Bernestine Amass, MD;  [facility];  Service: Urology;  Laterality: N/A;  . LUMBAR LAMINECTOMY/DECOMPRESSION MICRODISCECTOMY Right 11/29/2012   Procedure: Right Lumbar Four to Five Laminectomy/ Decompression Microdiskectomy;  Surgeon: Otilio Connors, MD;  Location: [place] [facility];  Service: Neurosurgery;  Laterality: Right;  LUMBAR LAMINECTOMY/DECOMPRESSION MICRODISCECTOMY 1 LEVEL  . POLYPECTOMY    . SHOULDER ARTHROSCOPY W/ ACROMIAL REPAIR Left 08-28-2002  . TOTAL KNEE ARTHROPLASTY Left 12/02/2019  . TOTAL KNEE ARTHROPLASTY Left 12/02/2019   Procedure: LEFT TOTAL KNEE ARTHROPLASTY;  Surgeon: Leandrew Koyanagi, MD;  Location: [place];  Service: Orthopedics;  Laterality: Left;  . UPPER GASTROINTESTINAL ENDOSCOPY     Social History   Occupational History  . Occupation:  retired  Tobacco Use  . Smoking status: Former Smoker    Packs/day: 0.50    Years: 22.00    Pack years: 11.00    Types: Cigarettes    Quit date: 06/27/1988    Years since quitting: 31.4  . Smokeless tobacco: Never Used  Vaping Use  . Vaping Use: Never used  Substance and Sexual Activity  . Alcohol use: Yes    Alcohol/week: 3.0 standard drinks    Types: 3 Standard drinks or equivalent per week    Comment: 4-5 drinks / day- cognac   . Drug use: No  . Sexual activity: Not on file

## 2019-12-19 ENCOUNTER — Other Ambulatory Visit: Payer: Self-pay

## 2019-12-19 ENCOUNTER — Ambulatory Visit (INDEPENDENT_AMBULATORY_CARE_PROVIDER_SITE_OTHER): Payer: Medicare PPO | Admitting: Physical Therapy

## 2019-12-19 DIAGNOSIS — M6281 Muscle weakness (generalized): Secondary | ICD-10-CM

## 2019-12-19 DIAGNOSIS — R2689 Other abnormalities of gait and mobility: Secondary | ICD-10-CM

## 2019-12-19 DIAGNOSIS — R600 Localized edema: Secondary | ICD-10-CM | POA: Diagnosis not present

## 2019-12-19 DIAGNOSIS — M25562 Pain in left knee: Secondary | ICD-10-CM | POA: Diagnosis not present

## 2019-12-19 NOTE — Therapy (Signed)
[place], [place], [address] Phone: [phone]   Fax:  [phone]  Physical Therapy Evaluation  Patient Details  Name: Douglas Carpenter MRN: [id number] Date of Birth: 08-04-1955 Referring Provider (PT): Leandrew Koyanagi, MD   Encounter Date: 12/19/2019   PT End of Session - 12/19/19 0948    Visit Number 1    Number of Visits 12    Date for PT Re-Evaluation 01/30/20   may only need 4 weeks pending progress   Authorization Type [employer]    PT Start Time 0850    PT Stop Time 0935    PT Time Calculation (min) 45 min    Activity Tolerance Patient tolerated treatment well    Behavior During Therapy [facility] for tasks assessed/performed           Past Medical History:  Diagnosis Date  . Allergy   . Arthritis    "everywhere"  . BPH (benign prostatic hypertrophy)   . Chronic rhinitis 01/27/2009  . ED (erectile dysfunction)   . GERD (gastroesophageal reflux disease)   . Headache(784.0)    migraines, as many as 3 in a week    . HYPERLIPIDEMIA 01/27/2009  . HYPERTENSION 01/27/2009  . Mild asthma    seasonal allergies, uses Proair once per yr.   . Mild obstructive sleep apnea    per pt -- mild osa test result--  no rx cpap needed  . Neuromuscular disorder ([place])   . Sleep apnea    no cpap  . Testosterone deficiency   . Urethral stricture   . Vocal cord anomaly    pt states told from [place]  doctor heard a little gurgle sound -- no farther testing done    Past Surgical History:  Procedure Laterality Date  . BACK SURGERY    . COLONOSCOPY    . CYSTOSCOPY WITH URETHRAL DILATATION N/A 01/07/2013   Procedure: CYSTOSCOPY WITH URETHRAL DILATATION BALLOON DILATION ;  Surgeon: Bernestine Amass, MD;  Location: [facility];  Service: Urology;  Laterality: N/A;  . LUMBAR LAMINECTOMY/DECOMPRESSION MICRODISCECTOMY Right 11/29/2012   Procedure: Right Lumbar Four to Five Laminectomy/ Decompression Microdiskectomy;  Surgeon: Otilio Connors, MD;  Location: [place] [facility];  Service: Neurosurgery;  Laterality: Right;  LUMBAR LAMINECTOMY/DECOMPRESSION MICRODISCECTOMY 1 LEVEL  . POLYPECTOMY    . SHOULDER ARTHROSCOPY W/ ACROMIAL REPAIR Left 08-28-2002  . TOTAL KNEE ARTHROPLASTY Left 12/02/2019  . TOTAL KNEE ARTHROPLASTY Left 12/02/2019   Procedure: LEFT TOTAL KNEE ARTHROPLASTY;  Surgeon: Leandrew Koyanagi, MD;  Location: [place];  Service: Orthopedics;  Laterality: Left;  . UPPER GASTROINTESTINAL ENDOSCOPY      There were no vitals filed for this visit.    Subjective Assessment - 12/19/19 0854    Subjective He had Lt TKA 12/02/19 and reports he is doing well after surgery.    Pertinent History PMH: HTN, mild asthma, BPH,GERD,sleep apnea, lumbar decompression and fusion sx 2014    Limitations Standing;Walking;House hold activities    How long can you walk comfortably? one block    Patient Stated Goals get back to fishing, camping    Currently in Pain? Yes    Pain Score 4     Pain Location Knee    Pain Orientation Left    Pain Descriptors / Indicators Aching;Sore    Pain Type Surgical pain    Pain Onset 1 to 4 weeks ago    Pain Frequency Constant    Aggravating Factors  steps  Pain Relieving Factors ice, CPM    Effect of Pain on Daily Activities limites most ADL's currently              [facility] PT Assessment - 12/19/19 0001      Assessment   Medical Diagnosis S/P Lt TKA 12/02/19    Referring Provider (PT) Leandrew Koyanagi, MD    Onset Date/Surgical Date 12/02/19    Hand Dominance Right    Next MD Visit 7/22    Prior Therapy [facility]      Precautions   Precautions None      Restrictions   Other Position/Activity Restrictions WBAT      Balance Screen   Has the patient fallen in the past 6 months No    Has the patient had a decrease in activity level because of a fear of falling?  No    Is the patient reluctant to leave their home because of a fear of falling?  No      Home Ecologist  residence    Additional Comments 3 steps to enter      Prior Function   Level of [place] Retired    Leisure fishing, hiking, camping      Cognition   Overall Cognitive Status Within Functional Limits for tasks assessed      Observation/Other Assessments   Observations incision healing with no signs of infection, mild to mod edema, no warmth or drainage      Functional Tests   Functional tests Sit to Stand;Single leg stance      Single Leg Stance   Comments 30 seconds on Lt but increaesed sway      Sit to Stand   Comments 5TSTS no UE support 11.7 sec      ROM / Strength   AROM / PROM / Strength AROM;Strength;PROM      AROM   AROM Assessment Site Knee    Right/Left Knee Left    Left Knee Extension 9    Left Knee Flexion 110      PROM   PROM Assessment Site Knee    Right/Left Knee Left    Left Knee Extension 5    Left Knee Flexion 119      Strength   Overall Strength Comments 4+ knee strength and hip strength on Lt      Transfers   Transfers Independent with all Transfers      Ambulation/Gait   Ambulation/Gait Yes    Ambulation/Gait Assistance 6: Modified independent (Device/Increase time)    Ambulation Distance (Feet) 200 Feet    Assistive device Straight cane    Gait Pattern Step-through pattern;Decreased step length - right;Decreased step length - left;Decreased stance time - left                      Objective measurements completed on examination: See above findings.       [place] Adult PT Treatment/Exercise - 12/19/19 0001      Exercises   Exercises Knee/Hip      Knee/Hip Exercises: Stretches   Active Hamstring Stretch Left;2 reps;30 seconds    Quad Stretch Left;2 reps;30 seconds    Other Knee/Hip Stretches supine heelslides AAROM with strap 10 sec X 10      Knee/Hip Exercises: Aerobic   Recumbent Bike next visit      Knee/Hip Exercises: Seated   Long [employer] reps    Long [employer]  Weight 3 lbs.       Modalities   Modalities Vasopneumatic      Vasopneumatic   Number Minutes Vasopneumatic  10 minutes    Vasopnuematic Location  Knee    Vasopneumatic Pressure Medium    Vasopneumatic Temperature  34      Manual Therapy   Manual therapy comments Lt knee PROM and flexion/extension mobs to tolerance                  PT Education - 12/19/19 0948    Education Details HEP, POC    Person(s) Educated Patient    Methods Explanation;Demonstration;Verbal cues;Handout    Comprehension Verbalized understanding;Returned demonstration               PT Long Term Goals - 12/19/19 0957      PT LONG TERM GOAL #1   Title independent with HEP and understand how to progress himself.    Time 6    Period Weeks    Status New    Target Date 01/30/20      PT LONG TERM GOAL #2   Title Improve Lt knee strength to 5/5 MMT    Baseline 4    Time 6    Period Weeks    Status New      PT LONG TERM GOAL #3   Title Improve Lt knee ROM 2-120 deg AROM    Baseline 10-114 AROM    Time 6    Period Weeks    Status New      PT LONG TERM GOAL #4   Title ambulate community and on uneven surfaces at least 1000 ft no AD and return to ususal ADL's and hobbies    Time 6    Period Weeks    Status New                  Plan - 12/19/19 0949    Clinical Impression Statement Thor presents S/P Lt TKA 12/02/19. He is overal doing exceptionally well after surgery and likely will only need 4 weeks of PT, but POC set for 6 weeks pending his progress. He does still have mild limitations in Lt knee strength, ROM, gait, endurance, and balance along with some localized edema. He will benefit from skilled PT to address his deficits.    Personal Factors and Comorbidities Comorbidity 3+    Comorbidities PMH: HTN, mild asthma, BPH,GERD,sleep apnea, lumbar decompression and fusion sx 2014    Examination-Activity Limitations Bend;Sleep;Squat;Stairs;Stand;Lift;Locomotion Level     Examination-Participation Restrictions Community Activity;Driving;Yard Work;Shop    Stability/Clinical Decision Making Stable/Uncomplicated    Clinical Decision Making Low    Rehab Potential Excellent    PT Frequency 2x / week    PT Duration 6 weeks    PT Treatment/Interventions ADLs/Self Care Home Management;Cryotherapy;Teaching laboratory technician;Therapeutic activities;Therapeutic exercise;Neuromuscular re-education;Manual techniques;Passive range of motion;Joint Manipulations;Taping;Vasopneumatic Device    PT Next Visit Plan review HEP, warm up on bike, progress to no AD, step ups    PT Home Exercise Plan Access Code: [email or web address]    Consulted and Agree with Plan of Care Patient           Patient will benefit from skilled therapeutic intervention in order to improve the following deficits and impairments:  Abnormal gait, Decreased activity tolerance, Decreased balance, Decreased endurance, Decreased mobility, Decreased range of motion, Decreased strength, Difficulty walking, Hypomobility, Increased fascial restricitons, Impaired flexibility, Pain  Visit Diagnosis: Acute pain of left knee  Muscle weakness (generalized)  Localized edema  Other abnormalities of gait and mobility     Problem List Patient Active Problem List   Diagnosis Date Noted  . Status post total left knee replacement 12/02/2019  . Primary osteoarthritis of left knee 04/16/2019  . Primary osteoarthritis of right knee 02/20/2019  . Exercise-induced asthma 07/21/2014  . Chronic cough 07/21/2014  . Upper airway cough syndrome 07/21/2014  . Allergic rhinitis 07/21/2014  . Lumbar stenosis 11/06/2013  . Urethral stricture 01/07/2013  . Prostatic hypertrophy 11/12/2012  . VOCAL CORD DISORDER 02/19/2009  . HYPERLIPIDEMIA 01/27/2009  . Snoring 01/27/2009  . HYPERTENSION 01/27/2009  . CHRONIC RHINITIS 01/27/2009  . Asthma 01/27/2009  . GERD 01/27/2009    Debbe Odea,  PT,DPT 12/19/2019, 10:02 AM  [facility] [address] [place], [place], [address] Phone: [phone]   Fax:  [phone]  Name: Douglas Carpenter MRN: [id number] Date of Birth: 1955-11-14

## 2019-12-19 NOTE — Patient Instructions (Signed)
Access Code: [email or web address]: 06/24/2021Prepared by: Aaron Edelman NelsonExercises  Supine Heel Slide with Strap - 2 x daily - 6 x weekly - 2 sets - 10 reps - 5 hold  Seated Hamstring Stretch - 2 x daily - 6 x weekly - 1 sets - 3 reps - 30 hold  Sit to Stand without Arm Support - 2 x daily - 6 x weekly - 10 reps - 1-2 sets  Standing Marching - 2 x daily - 6 x weekly - 10 reps - 1-2 sets  Standing Hamstring Curl with Resistance - 2 x daily - 6 x weekly - 3 sets - 10 reps  Seated Knee Extension with Resistance - 2 x daily - 6 x weekly - 3 sets - 10 reps

## 2019-12-20 ENCOUNTER — Encounter: Payer: Medicare PPO | Admitting: Physical Therapy

## 2019-12-23 ENCOUNTER — Other Ambulatory Visit: Payer: Self-pay

## 2019-12-23 ENCOUNTER — Encounter: Payer: Self-pay | Admitting: Physical Therapy

## 2019-12-23 ENCOUNTER — Ambulatory Visit (INDEPENDENT_AMBULATORY_CARE_PROVIDER_SITE_OTHER): Payer: Medicare PPO | Admitting: Physical Therapy

## 2019-12-23 DIAGNOSIS — M25562 Pain in left knee: Secondary | ICD-10-CM

## 2019-12-23 DIAGNOSIS — R600 Localized edema: Secondary | ICD-10-CM | POA: Diagnosis not present

## 2019-12-23 DIAGNOSIS — M6281 Muscle weakness (generalized): Secondary | ICD-10-CM

## 2019-12-23 DIAGNOSIS — R2689 Other abnormalities of gait and mobility: Secondary | ICD-10-CM

## 2019-12-23 NOTE — Therapy (Signed)
[place], [place], [address] Phone: [phone]   Fax:  [phone]  Physical Therapy Treatment  Patient Details  Name: Douglas Carpenter MRN: [id number] Date of Birth: [birth year] Referring Provider (PT): Leandrew Koyanagi, MD   Encounter Date: 12/23/2019   PT End of Session - 12/23/19 1336    Visit Number 2    Number of Visits 12    Date for PT Re-Evaluation 01/30/20   may only need 4 weeks pending progress   Authorization Type [employer]    Authorization Time Period 12 visits until 8/5    Authorization - Visit Number 2    Authorization - Number of Visits 12    Progress Note Due on Visit 10    PT Start Time 1300    PT Stop Time 1353    PT Time Calculation (min) 53 min    Activity Tolerance Patient tolerated treatment well    Behavior During Therapy [facility] for tasks assessed/performed           Past Medical History:  Diagnosis Date  . Allergy   . Arthritis    "everywhere"  . BPH (benign prostatic hypertrophy)   . Chronic rhinitis 01/27/2009  . ED (erectile dysfunction)   . GERD (gastroesophageal reflux disease)   . Headache(784.0)    migraines, as many as 3 in a week    . HYPERLIPIDEMIA 01/27/2009  . HYPERTENSION 01/27/2009  . Mild asthma    seasonal allergies, uses Proair once per yr.   . Mild obstructive sleep apnea    per pt -- mild osa test result--  no rx cpap needed  . Neuromuscular disorder ([place])   . Sleep apnea    no cpap  . Testosterone deficiency   . Urethral stricture   . Vocal cord anomaly    pt states told from [place]  doctor heard a little gurgle sound -- no farther testing done    Past Surgical History:  Procedure Laterality Date  . BACK SURGERY    . COLONOSCOPY    . CYSTOSCOPY WITH URETHRAL DILATATION N/A 01/07/2013   Procedure: CYSTOSCOPY WITH URETHRAL DILATATION BALLOON DILATION ;  Surgeon: Bernestine Amass, MD;  Location: [facility];  Service: Urology;  Laterality: N/A;  . LUMBAR  LAMINECTOMY/DECOMPRESSION MICRODISCECTOMY Right 11/29/2012   Procedure: Right Lumbar Four to Five Laminectomy/ Decompression Microdiskectomy;  Surgeon: Otilio Connors, MD;  Location: [place] [facility];  Service: Neurosurgery;  Laterality: Right;  LUMBAR LAMINECTOMY/DECOMPRESSION MICRODISCECTOMY 1 LEVEL  . POLYPECTOMY    . SHOULDER ARTHROSCOPY W/ ACROMIAL REPAIR Left 08-28-2002  . TOTAL KNEE ARTHROPLASTY Left 12/02/2019  . TOTAL KNEE ARTHROPLASTY Left 12/02/2019   Procedure: LEFT TOTAL KNEE ARTHROPLASTY;  Surgeon: Leandrew Koyanagi, MD;  Location: [place];  Service: Orthopedics;  Laterality: Left;  . UPPER GASTROINTESTINAL ENDOSCOPY      There were no vitals filed for this visit.   Subjective Assessment - 12/23/19 1314    Subjective He relays not much Lt knee pain upon arrival but he gets pain that wakes him up at night still and still having some numbness in his knee post op    Pertinent History PMH: HTN, mild asthma, BPH,GERD,sleep apnea, lumbar decompression and fusion sx 2014    Limitations Standing;Walking;House hold activities    How long can you walk comfortably? one block    Patient Stated Goals get back to fishing, camping    Pain Onset 1 to 4 weeks ago  [facility] Adult PT Treatment/Exercise - 12/23/19 0001      Ambulation/Gait   Ambulation/Gait Yes    Ambulation/Gait Assistance 7: Independent    Ambulation Distance (Feet) 300 Feet    Gait Comments has now progressed to no AD for ambulation on even surfaces at least 300 ft      Knee/Hip Exercises: Stretches   Active Hamstring Stretch Left;3 reps;30 seconds    Knee: Self-Stretch Limitations knee lunge stretch with foot in chair 10 sec X 10 reps    Gastroc Stretch Limitations 3X30 sec bilat on slantboard    Other Knee/Hip Stretches supine TKE heel prop stretch for extension the first 5 min of vaso      Knee/Hip Exercises: Aerobic   Recumbent Bike L2 X 8 min full revolutions      Knee/Hip Exercises: Machines for Strengthening     Cybex Knee Extension DL 5 lbs X 20 reps    Cybex Knee Flexion DL 25 lbs X 20, Lt only 15 lbs X 20    Cybex Leg Press DL 106 lbs X 20, then Lt only 62 lbs 2X15      Knee/Hip Exercises: Standing   Lateral Step Up Both;10 reps    Lateral Step Up Limitations step up and off the other side    Forward Step Up Left;15 reps;Step Height: 6";Hand Hold: 0      Knee/Hip Exercises: Seated   Sit to Sand 10 reps;without UE support   Rt foot slightly further in front for more wt shift Lt     Modalities   Modalities Vasopneumatic      Vasopneumatic   Number Minutes Vasopneumatic  10 minutes    Vasopnuematic Location  Knee    Vasopneumatic Pressure Medium    Vasopneumatic Temperature  34      Manual Therapy   Manual therapy comments Lt knee PROM and flexion/extension mobs to tolerance                       PT Long Term Goals - 12/19/19 0957      PT LONG TERM GOAL #1   Title independent with HEP and understand how to progress himself.    Time 6    Period Weeks    Status New    Target Date 01/30/20      PT LONG TERM GOAL #2   Title Improve Lt knee strength to 5/5 MMT    Baseline 4    Time 6    Period Weeks    Status New      PT LONG TERM GOAL #3   Title Improve Lt knee ROM 2-120 deg AROM    Baseline 10-114 AROM    Time 6    Period Weeks    Status New      PT LONG TERM GOAL #4   Title ambulate community and on uneven surfaces at least 1000 ft no AD and return to ususal ADL's and hobbies    Time 6    Period Weeks    Status New                 Plan - 12/23/19 1343    Clinical Impression Statement he has progressed ambulation in that he now does not require assistive device. He was able to perform strengthening on weight machines and closed chain exercises with great tolerance and no complaints of pain. used vaso post treatment to reduce overall sorenes and edema in his Lt knee.  Personal Factors and Comorbidities Comorbidity 3+    Comorbidities PMH: HTN,  mild asthma, BPH,GERD,sleep apnea, lumbar decompression and fusion sx 2014    Examination-Activity Limitations Bend;Sleep;Squat;Stairs;Stand;Lift;Locomotion Level    Examination-Participation Restrictions Community Activity;Driving;Yard Work;Shop    Stability/Clinical Decision Making Stable/Uncomplicated    Rehab Potential Excellent    PT Frequency 2x / week    PT Duration 6 weeks   likely will only need 4 weeks pending progress   PT Treatment/Interventions ADLs/Self Care Home Management;Cryotherapy;Teaching laboratory technician;Therapeutic activities;Therapeutic exercise;Neuromuscular re-education;Manual techniques;Passive range of motion;Joint Manipulations;Taping;Vasopneumatic Device    PT Next Visit Plan progress strength, dynamic balance, ROM as able    PT Home Exercise Plan Access Code: [email or web address]    Consulted and Agree with Plan of Care Patient           Patient will benefit from skilled therapeutic intervention in order to improve the following deficits and impairments:  Abnormal gait, Decreased activity tolerance, Decreased balance, Decreased endurance, Decreased mobility, Decreased range of motion, Decreased strength, Difficulty walking, Hypomobility, Increased fascial restricitons, Impaired flexibility, Pain  Visit Diagnosis: Acute pain of left knee  Muscle weakness (generalized)  Localized edema  Other abnormalities of gait and mobility     Problem List Patient Active Problem List   Diagnosis Date Noted  . Status post total left knee replacement 12/02/2019  . Primary osteoarthritis of left knee 04/16/2019  . Primary osteoarthritis of right knee 02/20/2019  . Exercise-induced asthma 07/21/2014  . Chronic cough 07/21/2014  . Upper airway cough syndrome 07/21/2014  . Allergic rhinitis 07/21/2014  . Lumbar stenosis 11/06/2013  . Urethral stricture 01/07/2013  . Prostatic hypertrophy 11/12/2012  . VOCAL CORD DISORDER 02/19/2009  .  HYPERLIPIDEMIA 01/27/2009  . Snoring 01/27/2009  . HYPERTENSION 01/27/2009  . CHRONIC RHINITIS 01/27/2009  . Asthma 01/27/2009  . GERD 01/27/2009    Debbe Odea, PT,DPT 12/23/2019, 1:50 PM  [facility] [address] [place], [place], [address] Phone: [phone]   Fax:  [phone]  Name: JAXXEN VOONG MRN: [id number] Date of Birth: 1956/01/22

## 2019-12-25 ENCOUNTER — Other Ambulatory Visit: Payer: Self-pay

## 2019-12-25 ENCOUNTER — Encounter: Payer: Self-pay | Admitting: Physical Therapy

## 2019-12-25 ENCOUNTER — Ambulatory Visit (INDEPENDENT_AMBULATORY_CARE_PROVIDER_SITE_OTHER): Payer: Medicare PPO | Admitting: Physical Therapy

## 2019-12-25 DIAGNOSIS — M6281 Muscle weakness (generalized): Secondary | ICD-10-CM

## 2019-12-25 DIAGNOSIS — R2689 Other abnormalities of gait and mobility: Secondary | ICD-10-CM | POA: Diagnosis not present

## 2019-12-25 DIAGNOSIS — M25562 Pain in left knee: Secondary | ICD-10-CM

## 2019-12-25 DIAGNOSIS — R600 Localized edema: Secondary | ICD-10-CM

## 2019-12-25 NOTE — Therapy (Signed)
[place], [place], [address] Phone: [phone]   Fax:  [phone]  Physical Therapy Treatment  Patient Details  Name: Douglas Carpenter MRN: [id number] Date of Birth: 01-08-1956 Referring Provider (PT): Leandrew Koyanagi, MD   Encounter Date: 12/25/2019   PT End of Session - 12/25/19 1339    Visit Number 3    Number of Visits 12    Date for PT Re-Evaluation 01/30/20   may only need 4 weeks pending progress   Authorization Type [employer]    Authorization Time Period 12 visits until 8/5    Authorization - Visit Number 3    Authorization - Number of Visits 12    Progress Note Due on Visit 10    PT Start Time 1300    PT Stop Time 1346    PT Time Calculation (min) 46 min    Activity Tolerance Patient tolerated treatment well    Behavior During Therapy [facility] for tasks assessed/performed           Past Medical History:  Diagnosis Date  . Allergy   . Arthritis    "everywhere"  . BPH (benign prostatic hypertrophy)   . Chronic rhinitis 01/27/2009  . ED (erectile dysfunction)   . GERD (gastroesophageal reflux disease)   . Headache(784.0)    migraines, as many as 3 in a week    . HYPERLIPIDEMIA 01/27/2009  . HYPERTENSION 01/27/2009  . Mild asthma    seasonal allergies, uses Proair once per yr.   . Mild obstructive sleep apnea    per pt -- mild osa test result--  no rx cpap needed  . Neuromuscular disorder ([place])   . Sleep apnea    no cpap  . Testosterone deficiency   . Urethral stricture   . Vocal cord anomaly    pt states told from [place]  doctor heard a little gurgle sound -- no farther testing done    Past Surgical History:  Procedure Laterality Date  . BACK SURGERY    . COLONOSCOPY    . CYSTOSCOPY WITH URETHRAL DILATATION N/A 01/07/2013   Procedure: CYSTOSCOPY WITH URETHRAL DILATATION BALLOON DILATION ;  Surgeon: Bernestine Amass, MD;  Location: [facility];  Service: Urology;  Laterality: N/A;  . LUMBAR  LAMINECTOMY/DECOMPRESSION MICRODISCECTOMY Right 11/29/2012   Procedure: Right Lumbar Four to Five Laminectomy/ Decompression Microdiskectomy;  Surgeon: Otilio Connors, MD;  Location: [place] [facility];  Service: Neurosurgery;  Laterality: Right;  LUMBAR LAMINECTOMY/DECOMPRESSION MICRODISCECTOMY 1 LEVEL  . POLYPECTOMY    . SHOULDER ARTHROSCOPY W/ ACROMIAL REPAIR Left 08-28-2002  . TOTAL KNEE ARTHROPLASTY Left 12/02/2019  . TOTAL KNEE ARTHROPLASTY Left 12/02/2019   Procedure: LEFT TOTAL KNEE ARTHROPLASTY;  Surgeon: Leandrew Koyanagi, MD;  Location: [place];  Service: Orthopedics;  Laterality: Left;  . UPPER GASTROINTESTINAL ENDOSCOPY      There were no vitals filed for this visit.   Subjective Assessment - 12/25/19 1304    Subjective no pain upon arrival    Pertinent History PMH: HTN, mild asthma, BPH,GERD,sleep apnea, lumbar decompression and fusion sx 2014    Limitations Standing;Walking;House hold activities    How long can you walk comfortably? one block    Patient Stated Goals get back to fishing, camping    Pain Onset 1 to 4 weeks ago             [facility] Adult PT Treatment/Exercise - 12/25/19 0001      Knee/Hip Exercises: Stretches  Active Hamstring Stretch Left;3 reps;30 seconds    Quad Stretch Left;2 reps;30 seconds    Quad Stretch Limitations standing with strap over shoulder    Gastroc Stretch Limitations 3X30 sec bilat on slantboard      Knee/Hip Exercises: Aerobic   Recumbent Bike L5 X 8 min full revolutions      Knee/Hip Exercises: Machines for Strengthening   Cybex Knee Extension Lt leg only 10 lbs 3X10    Cybex Knee Flexion Lt leg only 15 lbs 2X15    Cybex Leg Press DL 112 lbs 2X15, then Lt only 68 lbs 2X15      Knee/Hip Exercises: Standing   Lateral Step Up Both;10 reps    Lateral Step Up Limitations step up and off the other side 8 inch step no UE support    Forward Step Up Left;15 reps;Hand Hold: 0;Step Height: 8"    Other Standing Knee Exercises deadlift 15 lbs from floor  3X10      Modalities   Modalities Vasopneumatic      Vasopneumatic   Number Minutes Vasopneumatic  10 minutes    Vasopnuematic Location  Knee    Vasopneumatic Pressure Medium    Vasopneumatic Temperature  34      Manual Therapy   Manual therapy comments Lt knee PROM and flexion/extension mobs to tolerance             PT Long Term Goals - 12/19/19 0957      PT LONG TERM GOAL #1   Title independent with HEP and understand how to progress himself.    Time 6    Period Weeks    Status New    Target Date 01/30/20      PT LONG TERM GOAL #2   Title Improve Lt knee strength to 5/5 MMT    Baseline 4    Time 6    Period Weeks    Status New      PT LONG TERM GOAL #3   Title Improve Lt knee ROM 2-120 deg AROM    Baseline 10-114 AROM    Time 6    Period Weeks    Status New      PT LONG TERM GOAL #4   Title ambulate community and on uneven surfaces at least 1000 ft no AD and return to ususal ADL's and hobbies    Time 6    Period Weeks    Status New                 Plan - 12/25/19 1341    Clinical Impression Statement Continues to do exceptionally well with PT and able to progress his strength training without complaints. His knee ROM is also doing very well this early on. Continue POC.    Personal Factors and Comorbidities Comorbidity 3+    Comorbidities PMH: HTN, mild asthma, BPH,GERD,sleep apnea, lumbar decompression and fusion sx 2014    Examination-Activity Limitations Bend;Sleep;Squat;Stairs;Stand;Lift;Locomotion Level    Examination-Participation Restrictions Community Activity;Driving;Yard Work;Shop    Stability/Clinical Decision Making Stable/Uncomplicated    Rehab Potential Excellent    PT Frequency 2x / week    PT Duration 6 weeks   likely will only need 4 weeks pending progress   PT Treatment/Interventions ADLs/Self Care Home Management;Cryotherapy;Teaching laboratory technician;Therapeutic activities;Therapeutic  exercise;Neuromuscular re-education;Manual techniques;Passive range of motion;Joint Manipulations;Taping;Vasopneumatic Device    PT Next Visit Plan progress strength, dynamic balance, ROM as able    PT Home Exercise Plan Access Code: [email or web address]    [email or web address]  and Agree with Plan of Care Patient           Patient will benefit from skilled therapeutic intervention in order to improve the following deficits and impairments:  Abnormal gait, Decreased activity tolerance, Decreased balance, Decreased endurance, Decreased mobility, Decreased range of motion, Decreased strength, Difficulty walking, Hypomobility, Increased fascial restricitons, Impaired flexibility, Pain  Visit Diagnosis: Acute pain of left knee  Muscle weakness (generalized)  Localized edema  Other abnormalities of gait and mobility     Problem List Patient Active Problem List   Diagnosis Date Noted  . Status post total left knee replacement 12/02/2019  . Primary osteoarthritis of left knee 04/16/2019  . Primary osteoarthritis of right knee 02/20/2019  . Exercise-induced asthma 07/21/2014  . Chronic cough 07/21/2014  . Upper airway cough syndrome 07/21/2014  . Allergic rhinitis 07/21/2014  . Lumbar stenosis 11/06/2013  . Urethral stricture 01/07/2013  . Prostatic hypertrophy 11/12/2012  . VOCAL CORD DISORDER 02/19/2009  . HYPERLIPIDEMIA 01/27/2009  . Snoring 01/27/2009  . HYPERTENSION 01/27/2009  . CHRONIC RHINITIS 01/27/2009  . Asthma 01/27/2009  . GERD 01/27/2009    Silvestre Mesi 12/25/2019, 1:42 PM  [facility] Physical Therapy [address] [place], [place], [address] Phone: [phone]   Fax:  [phone]  Name: TYMOTHY CASS MRN: [id number] Date of Birth: [birth year]

## 2019-12-27 ENCOUNTER — Encounter: Payer: Medicare PPO | Admitting: Physical Therapy

## 2020-01-01 ENCOUNTER — Other Ambulatory Visit: Payer: Self-pay

## 2020-01-01 ENCOUNTER — Ambulatory Visit (INDEPENDENT_AMBULATORY_CARE_PROVIDER_SITE_OTHER): Payer: Medicare PPO | Admitting: Physical Therapy

## 2020-01-01 ENCOUNTER — Encounter: Payer: Self-pay | Admitting: Physical Therapy

## 2020-01-01 DIAGNOSIS — R2689 Other abnormalities of gait and mobility: Secondary | ICD-10-CM

## 2020-01-01 DIAGNOSIS — M25562 Pain in left knee: Secondary | ICD-10-CM | POA: Diagnosis not present

## 2020-01-01 DIAGNOSIS — M6281 Muscle weakness (generalized): Secondary | ICD-10-CM | POA: Diagnosis not present

## 2020-01-01 DIAGNOSIS — R600 Localized edema: Secondary | ICD-10-CM | POA: Diagnosis not present

## 2020-01-01 NOTE — Therapy (Signed)
[place], [place], [address] Phone: [phone]   Fax:  [phone]  Physical Therapy Treatment  Patient Details  Name: Douglas Carpenter MRN: [id number] Date of Birth: May 18, 1956 Referring Provider (PT): Leandrew Koyanagi, MD   Encounter Date: 01/01/2020   PT End of Session - 01/01/20 1215    Visit Number 4    Number of Visits 12    Date for PT Re-Evaluation 01/30/20   may only need 4 weeks pending progress   Authorization Type [employer]    Authorization Time Period 12 visits until 8/5    Authorization - Visit Number 4    Authorization - Number of Visits 12    Progress Note Due on Visit 10    PT Start Time 5176    PT Stop Time 1230    PT Time Calculation (min) 45 min    Activity Tolerance Patient tolerated treatment well    Behavior During Therapy [facility] for tasks assessed/performed           Past Medical History:  Diagnosis Date  . Allergy   . Arthritis    "everywhere"  . BPH (benign prostatic hypertrophy)   . Chronic rhinitis 01/27/2009  . ED (erectile dysfunction)   . GERD (gastroesophageal reflux disease)   . Headache(784.0)    migraines, as many as 3 in a week    . HYPERLIPIDEMIA 01/27/2009  . HYPERTENSION 01/27/2009  . Mild asthma    seasonal allergies, uses Proair once per yr.   . Mild obstructive sleep apnea    per pt -- mild osa test result--  no rx cpap needed  . Neuromuscular disorder ([place])   . Sleep apnea    no cpap  . Testosterone deficiency   . Urethral stricture   . Vocal cord anomaly    pt states told from [place]  doctor heard a little gurgle sound -- no farther testing done    Past Surgical History:  Procedure Laterality Date  . BACK SURGERY    . COLONOSCOPY    . CYSTOSCOPY WITH URETHRAL DILATATION N/A 01/07/2013   Procedure: CYSTOSCOPY WITH URETHRAL DILATATION BALLOON DILATION ;  Surgeon: Bernestine Amass, MD;  Location: [facility];  Service: Urology;  Laterality: N/A;  . LUMBAR  LAMINECTOMY/DECOMPRESSION MICRODISCECTOMY Right 11/29/2012   Procedure: Right Lumbar Four to Five Laminectomy/ Decompression Microdiskectomy;  Surgeon: Otilio Connors, MD;  Location: [place] [facility];  Service: Neurosurgery;  Laterality: Right;  LUMBAR LAMINECTOMY/DECOMPRESSION MICRODISCECTOMY 1 LEVEL  . POLYPECTOMY    . SHOULDER ARTHROSCOPY W/ ACROMIAL REPAIR Left 08-28-2002  . TOTAL KNEE ARTHROPLASTY Left 12/02/2019  . TOTAL KNEE ARTHROPLASTY Left 12/02/2019   Procedure: LEFT TOTAL KNEE ARTHROPLASTY;  Surgeon: Leandrew Koyanagi, MD;  Location: [place];  Service: Orthopedics;  Laterality: Left;  . UPPER GASTROINTESTINAL ENDOSCOPY      There were no vitals filed for this visit.   Subjective Assessment - 01/01/20 1203    Subjective no more than a 3/10 pain in his left knee upon arrival    Pertinent History PMH: HTN, mild asthma, BPH,GERD,sleep apnea, lumbar decompression and fusion sx 2014    Limitations Standing;Walking;House hold activities    How long can you walk comfortably? as much as he needs now    Patient Stated Goals get back to fishing, camping    Pain Onset 1 to 4 weeks ago              [facility] PT Assessment -  01/01/20 0001      Assessment   Medical Diagnosis S/P Lt TKA 12/02/19    Referring Provider (PT) Leandrew Koyanagi, MD    Onset Date/Surgical Date 12/02/19      AROM   Left Knee Extension 4    Left Knee Flexion 120      Strength   Overall Strength Comments Rt knee strength 5/5 MMT but still missing mild functional strength             [facility] Adult PT Treatment/Exercise - 01/01/20 0001      Knee/Hip Exercises: Stretches   Active Hamstring Stretch Left;3 reps;30 seconds    Quad Stretch Left;3 reps;30 seconds    Quad Stretch Limitations standing with strap over shoulder    Gastroc Stretch Limitations 2X30 sec bilat on slantboard    Other Knee/Hip Stretches supine TKE heel prop stretch for extension the first 5 min of vaso      Knee/Hip Exercises: Aerobic   Recumbent Bike  L5 X 8 min      Knee/Hip Exercises: Machines for Strengthening   Cybex Knee Extension Lt leg only 15 lbs 3X10    Cybex Knee Flexion DL 35 lbs 3X10    Cybex Leg Press DL 125 lbs 2X15, then Lt only 75 lbs 2X15      Knee/Hip Exercises: Standing   Lateral Step Up Both;10 reps    Lateral Step Up Limitations step up and off the other side 8 inch step no UE support    Forward Step Up Left;20 reps;Hand Hold: 0;Step Height: 8"    Other Standing Knee Exercises deadlift 20 lbs from floor 2X10      Vasopneumatic   Number Minutes Vasopneumatic  10 minutes    Vasopnuematic Location  Knee    Vasopneumatic Pressure Medium    Vasopneumatic Temperature  34               PT Long Term Goals - 01/01/20 1225      PT LONG TERM GOAL #1   Title independent with HEP and understand how to progress himself.    Time 6    Period Weeks    Status On-going      PT LONG TERM GOAL #2   Title Improve Lt knee strength to 5/5 MMT    Baseline 4    Time 6    Period Weeks    Status Achieved      PT LONG TERM GOAL #3   Title Improve Lt knee ROM 2-120 deg AROM    Baseline 3-120 AROM    Time 6    Period Weeks    Status On-going      PT LONG TERM GOAL #4   Title ambulate community and on uneven surfaces at least 1000 ft no AD and return to ususal ADL's and hobbies    Baseline now met    Time 6    Period Weeks    Status Achieved                 Plan - 01/01/20 1224    Clinical Impression Statement He was again able to progress resistance today with his knee strengthening. He continues to do really well with PT. Updated measurments reflect great progress with ROM and strength. Continue POC    Personal Factors and Comorbidities Comorbidity 3+    Comorbidities PMH: HTN, mild asthma, BPH,GERD,sleep apnea, lumbar decompression and fusion sx 2014    Examination-Activity Limitations Bend;Sleep;Squat;Stairs;Stand;Lift;Locomotion Level  Examination-Participation Restrictions Community  Activity;Driving;Yard Work;Shop    Stability/Clinical Decision Making Stable/Uncomplicated    Rehab Potential Excellent    PT Frequency 2x / week    PT Duration 6 weeks   likely will only need 4 weeks pending progress   PT Treatment/Interventions ADLs/Self Care Home Management;Cryotherapy;Teaching laboratory technician;Therapeutic activities;Therapeutic exercise;Neuromuscular re-education;Manual techniques;Passive range of motion;Joint Manipulations;Taping;Vasopneumatic Device    PT Next Visit Plan progress strength, dynamic balance, ROM as able    PT Home Exercise Plan Access Code: [email or web address]    Consulted and Agree with Plan of Care Patient           Patient will benefit from skilled therapeutic intervention in order to improve the following deficits and impairments:  Abnormal gait, Decreased activity tolerance, Decreased balance, Decreased endurance, Decreased mobility, Decreased range of motion, Decreased strength, Difficulty walking, Hypomobility, Increased fascial restricitons, Impaired flexibility, Pain  Visit Diagnosis: Acute pain of left knee  Muscle weakness (generalized)  Localized edema  Other abnormalities of gait and mobility     Problem List Patient Active Problem List   Diagnosis Date Noted  . Status post total left knee replacement 12/02/2019  . Primary osteoarthritis of left knee 04/16/2019  . Primary osteoarthritis of right knee 02/20/2019  . Exercise-induced asthma 07/21/2014  . Chronic cough 07/21/2014  . Upper airway cough syndrome 07/21/2014  . Allergic rhinitis 07/21/2014  . Lumbar stenosis 11/06/2013  . Urethral stricture 01/07/2013  . Prostatic hypertrophy 11/12/2012  . VOCAL CORD DISORDER 02/19/2009  . HYPERLIPIDEMIA 01/27/2009  . Snoring 01/27/2009  . HYPERTENSION 01/27/2009  . CHRONIC RHINITIS 01/27/2009  . Asthma 01/27/2009  . GERD 01/27/2009    Silvestre Mesi 01/01/2020, 12:27 PM  [facility] Physical Therapy [address] [place], [place], [address] Phone: [phone]   Fax:  [phone]  Name: ALESSANDRO GRIEP MRN: [id number] Date of Birth: 01-30-1956

## 2020-01-02 ENCOUNTER — Ambulatory Visit (INDEPENDENT_AMBULATORY_CARE_PROVIDER_SITE_OTHER): Payer: Medicare PPO | Admitting: Physical Therapy

## 2020-01-02 ENCOUNTER — Encounter: Payer: Self-pay | Admitting: Physical Therapy

## 2020-01-02 DIAGNOSIS — R600 Localized edema: Secondary | ICD-10-CM

## 2020-01-02 DIAGNOSIS — M6281 Muscle weakness (generalized): Secondary | ICD-10-CM | POA: Diagnosis not present

## 2020-01-02 DIAGNOSIS — R2689 Other abnormalities of gait and mobility: Secondary | ICD-10-CM

## 2020-01-02 DIAGNOSIS — M25562 Pain in left knee: Secondary | ICD-10-CM

## 2020-01-02 NOTE — Therapy (Signed)
[place], [place], [address] Phone: [phone]   Fax:  [phone]  Physical Therapy Treatment  Patient Details  Name: Douglas Carpenter MRN: [id number] Date of Birth: [birth year] Referring Provider (PT): Leandrew Koyanagi, MD   Encounter Date: 01/02/2020   PT End of Session - 01/02/20 1216    Visit Number 5    Number of Visits 12    Date for PT Re-Evaluation 01/30/20   may only need 4 weeks pending progress   Authorization Type [employer]    Authorization Time Period 12 visits until 8/5    Authorization - Visit Number 5    Authorization - Number of Visits 12    Progress Note Due on Visit 10    PT Start Time 1916    PT Stop Time 1230    PT Time Calculation (min) 45 min    Activity Tolerance Patient tolerated treatment well    Behavior During Therapy [facility] for tasks assessed/performed           Past Medical History:  Diagnosis Date  . Allergy   . Arthritis    "everywhere"  . BPH (benign prostatic hypertrophy)   . Chronic rhinitis 01/27/2009  . ED (erectile dysfunction)   . GERD (gastroesophageal reflux disease)   . Headache(784.0)    migraines, as many as 3 in a week    . HYPERLIPIDEMIA 01/27/2009  . HYPERTENSION 01/27/2009  . Mild asthma    seasonal allergies, uses Proair once per yr.   . Mild obstructive sleep apnea    per pt -- mild osa test result--  no rx cpap needed  . Neuromuscular disorder ([place])   . Sleep apnea    no cpap  . Testosterone deficiency   . Urethral stricture   . Vocal cord anomaly    pt states told from [place]  doctor heard a little gurgle sound -- no farther testing done    Past Surgical History:  Procedure Laterality Date  . BACK SURGERY    . COLONOSCOPY    . CYSTOSCOPY WITH URETHRAL DILATATION N/A 01/07/2013   Procedure: CYSTOSCOPY WITH URETHRAL DILATATION BALLOON DILATION ;  Surgeon: Bernestine Amass, MD;  Location: [facility];  Service: Urology;  Laterality: N/A;  . LUMBAR  LAMINECTOMY/DECOMPRESSION MICRODISCECTOMY Right 11/29/2012   Procedure: Right Lumbar Four to Five Laminectomy/ Decompression Microdiskectomy;  Surgeon: Otilio Connors, MD;  Location: [name] [facility];  Service: Neurosurgery;  Laterality: Right;  LUMBAR LAMINECTOMY/DECOMPRESSION MICRODISCECTOMY 1 LEVEL  . POLYPECTOMY    . SHOULDER ARTHROSCOPY W/ ACROMIAL REPAIR Left 08-28-2002  . TOTAL KNEE ARTHROPLASTY Left 12/02/2019  . TOTAL KNEE ARTHROPLASTY Left 12/02/2019   Procedure: LEFT TOTAL KNEE ARTHROPLASTY;  Surgeon: Leandrew Koyanagi, MD;  Location: [place];  Service: Orthopedics;  Laterality: Left;  . UPPER GASTROINTESTINAL ENDOSCOPY      There were no vitals filed for this visit.   Subjective Assessment - 01/02/20 1154    Subjective denies any pain or soreness upon arrival after yestedays session.    Pertinent History PMH: HTN, mild asthma, BPH,GERD,sleep apnea, lumbar decompression and fusion sx 2014    Limitations Standing;Walking;House hold activities    How long can you walk comfortably? as much as he needs now    Patient Stated Goals get back to fishing, camping    Currently in Pain? No/denies    Pain Onset 1 to 4 weeks ago  [place] Adult PT Treatment/Exercise - 01/02/20 0001      Knee/Hip Exercises: Stretches   Active Hamstring Stretch Left;3 reps;30 seconds    Quad Stretch Left;3 reps;30 seconds    Quad Stretch Limitations standing with strap over shoulder    Gastroc Stretch Limitations 2X30 sec bilat on slantboard    Other Knee/Hip Stretches supine TKE heel prop stretch for extension the first 5 min of vaso      Knee/Hip Exercises: Aerobic   Recumbent Bike L7 X 8 min      Knee/Hip Exercises: Machines for Strengthening   Cybex Knee Extension Lt leg only 15 lbs X30    Cybex Knee Flexion DL 35 lbs X 30 reps    Cybex Leg Press DL 137 lbs 30 reps , then Lt only 81 lbs X 30 reps      Knee/Hip Exercises: Standing   Other Standing Knee Exercises deadlift 20 lbs from floor 2X10     Other Standing Knee Exercises TRX squats 2X10 and TRX  lunges X 15 ea      Vasopneumatic   Number Minutes Vasopneumatic  10 minutes    Vasopnuematic Location  Knee    Vasopneumatic Pressure Medium    Vasopneumatic Temperature  34                       PT Long Term Goals - 01/01/20 1225      PT LONG TERM GOAL #1   Title independent with HEP and understand how to progress himself.    Time 6    Period Weeks    Status On-going      PT LONG TERM GOAL #2   Title Improve Lt knee strength to 5/5 MMT    Baseline 4    Time 6    Period Weeks    Status Achieved      PT LONG TERM GOAL #3   Title Improve Lt knee ROM 2-120 deg AROM    Baseline 3-120 AROM    Time 6    Period Weeks    Status On-going      PT LONG TERM GOAL #4   Title ambulate community and on uneven surfaces at least 1000 ft no AD and return to ususal ADL's and hobbies    Baseline now met    Time 6    Period Weeks    Status Achieved                 Plan - 01/02/20 1219    Clinical Impression Statement Continues to do great with PT and not having pain. progressed reistance today on strength machines for his Lt knee and added more functional strengthening of deep squats and lunges with TRX. He has no complaints during session and PT will progress as able    Comorbidities PMH: HTN, mild asthma, BPH,GERD,sleep apnea, lumbar decompression and fusion sx 2014    Examination-Activity Limitations Bend;Sleep;Squat;Stairs;Stand;Lift;Locomotion Level    Examination-Participation Restrictions Community Activity;Driving;Yard Work;Shop    PT Treatment/Interventions ADLs/Self Care Home Management;Cryotherapy;Teaching laboratory technician;Therapeutic activities;Therapeutic exercise;Neuromuscular re-education;Manual techniques;Passive range of motion;Joint Manipulations;Taping;Vasopneumatic Device    PT Next Visit Plan progress strength, dynamic balance, ROM as able    PT Home  Exercise Plan Access Code: [email or web address]    Consulted and Agree with Plan of Care Patient           Patient will benefit from skilled therapeutic intervention in order to improve the following deficits and impairments:  Abnormal gait,  Decreased activity tolerance, Decreased balance, Decreased endurance, Decreased mobility, Decreased range of motion, Decreased strength, Difficulty walking, Hypomobility, Increased fascial restricitons, Impaired flexibility, Pain  Visit Diagnosis: Acute pain of left knee  Muscle weakness (generalized)  Localized edema  Other abnormalities of gait and mobility     Problem List Patient Active Problem List   Diagnosis Date Noted  . Status post total left knee replacement 12/02/2019  . Primary osteoarthritis of left knee 04/16/2019  . Primary osteoarthritis of right knee 02/20/2019  . Exercise-induced asthma 07/21/2014  . Chronic cough 07/21/2014  . Upper airway cough syndrome 07/21/2014  . Allergic rhinitis 07/21/2014  . Lumbar stenosis 11/06/2013  . Urethral stricture 01/07/2013  . Prostatic hypertrophy 11/12/2012  . VOCAL CORD DISORDER 02/19/2009  . HYPERLIPIDEMIA 01/27/2009  . Snoring 01/27/2009  . HYPERTENSION 01/27/2009  . CHRONIC RHINITIS 01/27/2009  . Asthma 01/27/2009  . GERD 01/27/2009    Silvestre Mesi 01/02/2020, 12:24 PM  [facility] Physical Therapy [address] [place], [place], [address] Phone: [phone]   Fax:  [phone]  Name: Douglas Carpenter MRN: [id number] Date of Birth: 13-Jun-1956

## 2020-01-06 ENCOUNTER — Encounter: Payer: Medicare PPO | Admitting: Physical Therapy

## 2020-01-08 ENCOUNTER — Encounter: Payer: Medicare PPO | Admitting: Physical Therapy

## 2020-01-15 ENCOUNTER — Other Ambulatory Visit: Payer: Self-pay

## 2020-01-15 ENCOUNTER — Ambulatory Visit (INDEPENDENT_AMBULATORY_CARE_PROVIDER_SITE_OTHER): Payer: Medicare PPO | Admitting: Physical Therapy

## 2020-01-15 DIAGNOSIS — M25562 Pain in left knee: Secondary | ICD-10-CM

## 2020-01-15 DIAGNOSIS — M6281 Muscle weakness (generalized): Secondary | ICD-10-CM

## 2020-01-15 DIAGNOSIS — R2689 Other abnormalities of gait and mobility: Secondary | ICD-10-CM | POA: Diagnosis not present

## 2020-01-15 NOTE — Therapy (Signed)
[facility] Physical Therapy [address] [place], [place], [address] Phone: [phone]   Fax:  [phone]  Physical Therapy Treatment/Discharge PHYSICAL THERAPY DISCHARGE SUMMARY  Visits from Start of Care: 6 Current functional level related to goals / functional outcomes: Back to baseline   Remaining deficits: none   Education / Equipment: HEP Plan: Patient agrees to discharge.  Patient goals were partially met. Patient is being discharged due to meeting the stated rehab goals.  ?????       Patient Details  Name: Douglas Carpenter MRN: [id number] Date of Birth: [birth year] Referring Provider (PT): Leandrew Koyanagi, MD   Encounter Date: 01/15/2020   PT End of Session - 01/15/20 1050    Visit Number 6    Number of Visits 12    Date for PT Re-Evaluation 01/30/20   may only need 4 weeks pending progress   Authorization Type [employer]    Authorization Time Period 12 visits until 8/5    Authorization - Visit Number 6    Authorization - Number of Visits 12    Progress Note Due on Visit 10    PT Start Time 1020    PT Stop Time 1058    PT Time Calculation (min) 38 min    Activity Tolerance Patient tolerated treatment well    Behavior During Therapy WFL for tasks assessed/performed           Past Medical History:  Diagnosis Date  . Allergy   . Arthritis    "everywhere"  . BPH (benign prostatic hypertrophy)   . Chronic rhinitis 01/27/2009  . ED (erectile dysfunction)   . GERD (gastroesophageal reflux disease)   . Headache(784.0)    migraines, as many as 3 in a week    . HYPERLIPIDEMIA 01/27/2009  . HYPERTENSION 01/27/2009  . Mild asthma    seasonal allergies, uses Proair once per yr.   . Mild obstructive sleep apnea    per pt -- mild osa test result--  no rx cpap needed  . Neuromuscular disorder ([place])   . Sleep apnea    no cpap  . Testosterone deficiency   . Urethral stricture   . Vocal cord anomaly    pt states told from [place]  doctor heard a little  gurgle sound -- no farther testing done    Past Surgical History:  Procedure Laterality Date  . BACK SURGERY    . COLONOSCOPY    . CYSTOSCOPY WITH URETHRAL DILATATION N/A 01/07/2013   Procedure: CYSTOSCOPY WITH URETHRAL DILATATION BALLOON DILATION ;  Surgeon: Bernestine Amass, MD;  [facility]  Service: Urology;  Laterality: N/A;  . LUMBAR LAMINECTOMY/DECOMPRESSION MICRODISCECTOMY Right 11/29/2012   Procedure: Right Lumbar Four to Five Laminectomy/ Decompression Microdiskectomy;  Surgeon: Otilio Connors, MD;  Location: [place] [facility];  Service: Neurosurgery;  Laterality: Right;  LUMBAR LAMINECTOMY/DECOMPRESSION MICRODISCECTOMY 1 LEVEL  . POLYPECTOMY    . SHOULDER ARTHROSCOPY W/ ACROMIAL REPAIR Left 08-28-2002  . TOTAL KNEE ARTHROPLASTY Left 12/02/2019  . TOTAL KNEE ARTHROPLASTY Left 12/02/2019   Procedure: LEFT TOTAL KNEE ARTHROPLASTY;  Surgeon: Leandrew Koyanagi, MD;  Location: [place];  Service: Orthopedics;  Laterality: Left;  . UPPER GASTROINTESTINAL ENDOSCOPY      There were no vitals filed for this visit.   Subjective Assessment - 01/15/20 1055    Subjective he has no pain or complaints feels ready to discharge    Pertinent History PMH: HTN, mild asthma, BPH,GERD,sleep apnea, lumbar decompression and fusion sx  2014    Limitations Standing;Walking;House hold activities    How long can you walk comfortably? as much as he needs now    Patient Stated Goals get back to fishing, camping    Pain Onset 1 to 4 weeks ago              [facility] PT Assessment - 01/15/20 0001      Assessment   Medical Diagnosis S/P Lt TKA 12/02/19    Referring Provider (PT) Leandrew Koyanagi, MD    Onset Date/Surgical Date 12/02/19      AROM   Left Knee Extension 0    Left Knee Flexion 120      Strength   Overall Strength Comments Rt hip and knee strength 5/5                         [facility] Adult PT Treatment/Exercise - 01/15/20 0001      Knee/Hip Exercises: Aerobic    Recumbent Bike L8 X 8 min      Knee/Hip Exercises: Machines for Strengthening   Cybex Knee Extension Lt leg only 20 lbs 3X10    Cybex Knee Flexion DL 45 lbs 2X15    Cybex Leg Press DL 168 lbs 30 reps , then Lt only 100 lbs X 2X15reps      Knee/Hip Exercises: Standing   Lateral Step Up Both;10 reps    Lateral Step Up Limitations step up and off the other side 8 inch step no UE support    Forward Step Up Left;Hand Hold: 0;Step Height: 8";Right;15 reps    Forward Step Up Limitations with alt leg march    SLS can hold 30 seconds to progressed to head turns and then eyes closed                       PT Long Term Goals - 01/15/20 1034      PT LONG TERM GOAL #1   Title independent with HEP and understand how to progress himself.    Time 6    Period Weeks    Status Achieved      PT LONG TERM GOAL #2   Title Improve Lt knee strength to 5/5 MMT    Baseline 4    Time 6    Period Weeks    Status Achieved      PT LONG TERM GOAL #3   Title Improve Lt knee ROM 2-120 deg AROM    Baseline 0-120    Time 6    Period Weeks    Status Achieved      PT LONG TERM GOAL #4   Title ambulate community and on uneven surfaces at least 1000 ft no AD and return to ususal ADL's and hobbies    Baseline now met    Time 6    Period Weeks    Status Achieved                 Plan - 01/15/20 1056    Clinical Impression Statement He has met all goals and has great knee strength and ROM. He is back to functional baseline and pleased with his progress. He will be discharged today and he had no further questions or concerns.    Comorbidities PMH: HTN, mild asthma, BPH,GERD,sleep apnea, lumbar decompression and fusion sx 2014    Examination-Activity Limitations Bend;Sleep;Squat;Stairs;Stand;Lift;Locomotion Level    Examination-Participation Restrictions Community Activity;Driving;Yard Work;Shop    PT Treatment/Interventions  ADLs/Self Care Home Management;Cryotherapy;Research officer, trade union;Therapeutic activities;Therapeutic exercise;Neuromuscular re-education;Manual techniques;Passive range of motion;Joint Manipulations;Taping;Vasopneumatic Device    PT Next Visit Plan DC today    PT Home Exercise Plan Access Code: [email or web address]    Consulted and Agree with Plan of Care Patient           Patient will benefit from skilled therapeutic intervention in order to improve the following deficits and impairments:  Abnormal gait, Decreased activity tolerance, Decreased balance, Decreased endurance, Decreased mobility, Decreased range of motion, Decreased strength, Difficulty walking, Hypomobility, Increased fascial restricitons, Impaired flexibility, Pain  Visit Diagnosis: Acute pain of left knee  Muscle weakness (generalized)  Other abnormalities of gait and mobility     Problem List Patient Active Problem List   Diagnosis Date Noted  . Status post total left knee replacement 12/02/2019  . Primary osteoarthritis of left knee 04/16/2019  . Primary osteoarthritis of right knee 02/20/2019  . Exercise-induced asthma 07/21/2014  . Chronic cough 07/21/2014  . Upper airway cough syndrome 07/21/2014  . Allergic rhinitis 07/21/2014  . Lumbar stenosis 11/06/2013  . Urethral stricture 01/07/2013  . Prostatic hypertrophy 11/12/2012  . VOCAL CORD DISORDER 02/19/2009  . HYPERLIPIDEMIA 01/27/2009  . Snoring 01/27/2009  . HYPERTENSION 01/27/2009  . CHRONIC RHINITIS 01/27/2009  . Asthma 01/27/2009  . GERD 01/27/2009    Silvestre Mesi 01/15/2020, 10:57 AM  [facility] Physical Therapy [address] [place], [place], [address] Phone: [phone]   Fax:  [phone]  Name: Douglas Carpenter MRN: [id number] Date of Birth: 07/13/1955

## 2020-01-16 ENCOUNTER — Encounter: Payer: Self-pay | Admitting: Orthopaedic Surgery

## 2020-01-16 ENCOUNTER — Ambulatory Visit (INDEPENDENT_AMBULATORY_CARE_PROVIDER_SITE_OTHER): Payer: Medicare PPO

## 2020-01-16 ENCOUNTER — Ambulatory Visit (INDEPENDENT_AMBULATORY_CARE_PROVIDER_SITE_OTHER): Payer: Medicare PPO | Admitting: Orthopaedic Surgery

## 2020-01-16 DIAGNOSIS — Z96652 Presence of left artificial knee joint: Secondary | ICD-10-CM | POA: Diagnosis not present

## 2020-01-19 NOTE — Progress Notes (Signed)
Post-Op Visit Note   Patient: Douglas Carpenter           Date of Birth: 09/05/1955           MRN: [id number] Visit Date: 01/16/2020 PCP: Rocco Serene, MD   Assessment & Plan:  Chief Complaint:  Chief Complaint  Patient presents with  . Left Knee - Pain   Visit Diagnoses:  1. Status post total left knee replacement     Plan: Mr. Shearer is 6 weeks status post left total knee replacement.  He is doing well overall has no real complaints.  Not taking any medications for pain.  He has completed physical therapy.  He is able to achieve 0-120 degrees.  His surgical scar is well-healed.  He will continue doing his home exercises.  He can discontinue DVT prophylaxis.  I will plan to see him back in 6 weeks for his 34-month visit.  Follow-Up Instructions: Return in about 6 weeks (around 02/27/2020).   Orders:  Orders Placed This Encounter  Procedures  . XR Knee 1-2 Views Left   No orders of the defined types were placed in this encounter.   Imaging: No results found.  PMFS History: Patient Active Problem List   Diagnosis Date Noted  . Status post total left knee replacement 12/02/2019  . Primary osteoarthritis of left knee 04/16/2019  . Primary osteoarthritis of right knee 02/20/2019  . Exercise-induced asthma 07/21/2014  . Chronic cough 07/21/2014  . Upper airway cough syndrome 07/21/2014  . Allergic rhinitis 07/21/2014  . Lumbar stenosis 11/06/2013  . Urethral stricture 01/07/2013  . Prostatic hypertrophy 11/12/2012  . VOCAL CORD DISORDER 02/19/2009  . HYPERLIPIDEMIA 01/27/2009  . Snoring 01/27/2009  . HYPERTENSION 01/27/2009  . CHRONIC RHINITIS 01/27/2009  . Asthma 01/27/2009  . GERD 01/27/2009   Past Medical History:  Diagnosis Date  . Allergy   . Arthritis    "everywhere"  . BPH (benign prostatic hypertrophy)   . Chronic rhinitis 01/27/2009  . ED (erectile dysfunction)   . GERD (gastroesophageal reflux disease)   . Headache(784.0)    migraines, as many as 3 in  a week    . HYPERLIPIDEMIA 01/27/2009  . HYPERTENSION 01/27/2009  . Mild asthma    seasonal allergies, uses Proair once per yr.   . Mild obstructive sleep apnea    per pt -- mild osa test result--  no rx cpap needed  . Neuromuscular disorder ([place])   . Sleep apnea    no cpap  . Testosterone deficiency   . Urethral stricture   . Vocal cord anomaly    pt states told from [place]  doctor heard a little gurgle sound -- no farther testing done    Family History  Problem Relation Age of Onset  . Cancer Mother        lung  . Lung cancer Mother   . Heart disease Maternal Grandmother   . Heart disease Maternal Grandfather   . Cancer Maternal Grandfather        colon, prostate,lung  . Colon cancer Neg Hx   . Colon polyps Neg Hx   . Esophageal cancer Neg Hx   . Rectal cancer Neg Hx   . Stomach cancer Neg Hx     Past Surgical History:  Procedure Laterality Date  . BACK SURGERY    . COLONOSCOPY    . CYSTOSCOPY WITH URETHRAL DILATATION N/A 01/07/2013   Procedure: CYSTOSCOPY WITH URETHRAL DILATATION BALLOON DILATION ;  Surgeon: Camelia Eng  Risa Grill, MD;  Location: [facility];  Service: Urology;  Laterality: N/A;  . LUMBAR LAMINECTOMY/DECOMPRESSION MICRODISCECTOMY Right 11/29/2012   Procedure: Right Lumbar Four to Five Laminectomy/ Decompression Microdiskectomy;  Surgeon: Otilio Connors, MD;  Location: [place] [facility];  Service: Neurosurgery;  Laterality: Right;  LUMBAR LAMINECTOMY/DECOMPRESSION MICRODISCECTOMY 1 LEVEL  . POLYPECTOMY    . SHOULDER ARTHROSCOPY W/ ACROMIAL REPAIR Left 08-28-2002  . TOTAL KNEE ARTHROPLASTY Left 12/02/2019  . TOTAL KNEE ARTHROPLASTY Left 12/02/2019   Procedure: LEFT TOTAL KNEE ARTHROPLASTY;  Surgeon: Leandrew Koyanagi, MD;  Location: [facility];  Service: Orthopedics;  Laterality: Left;  . UPPER GASTROINTESTINAL ENDOSCOPY     Social History   Occupational History  . Occupation: retired  Tobacco Use  . Smoking status: Former Smoker    Packs/day: 0.50    Years: 22.00     Pack years: 11.00    Types: Cigarettes    Quit date: 06/27/1988    Years since quitting: 31.5  . Smokeless tobacco: Never Used  Vaping Use  . Vaping Use: Never used  Substance and Sexual Activity  . Alcohol use: Yes    Alcohol/week: 3.0 standard drinks    Types: 3 Standard drinks or equivalent per week    Comment: 4-5 drinks / day- cognac   . Drug use: No  . Sexual activity: Not on file

## 2020-02-18 ENCOUNTER — Other Ambulatory Visit: Payer: Medicare PPO

## 2020-02-18 ENCOUNTER — Other Ambulatory Visit: Payer: Self-pay

## 2020-02-18 DIAGNOSIS — Z20822 Contact with and (suspected) exposure to covid-19: Secondary | ICD-10-CM

## 2020-02-19 LAB — SARS-COV-2, NAA 2 DAY TAT

## 2020-02-19 LAB — NOVEL CORONAVIRUS, NAA: SARS-CoV-2, NAA: NOT DETECTED

## 2020-02-21 ENCOUNTER — Encounter: Payer: Self-pay | Admitting: Physician Assistant

## 2020-02-21 ENCOUNTER — Ambulatory Visit (INDEPENDENT_AMBULATORY_CARE_PROVIDER_SITE_OTHER): Payer: Medicare PPO | Admitting: Orthopaedic Surgery

## 2020-02-21 DIAGNOSIS — Z96652 Presence of left artificial knee joint: Secondary | ICD-10-CM

## 2020-02-21 DIAGNOSIS — M1711 Unilateral primary osteoarthritis, right knee: Secondary | ICD-10-CM | POA: Diagnosis not present

## 2020-02-21 MED ORDER — LIDOCAINE HCL 1 % IJ SOLN
2.0000 mL | INTRAMUSCULAR | Status: AC | PRN
Start: 2020-02-21 — End: 2020-02-21
  Administered 2020-02-21: 2 mL

## 2020-02-21 MED ORDER — METHYLPREDNISOLONE ACETATE 40 MG/ML IJ SUSP
40.0000 mg | INTRAMUSCULAR | Status: AC | PRN
  Administered 2020-02-21: 40 mg via INTRA_ARTICULAR

## 2020-02-21 MED ORDER — BUPIVACAINE HCL 0.25 % IJ SOLN
2.0000 mL | INTRAMUSCULAR | Status: AC | PRN
  Administered 2020-02-21: 2 mL via INTRA_ARTICULAR

## 2020-02-21 NOTE — Progress Notes (Signed)
Office Visit Note   Patient: Douglas Carpenter           Date of Birth: [birth year]           MRN: [id number] Visit Date: 02/21/2020              Requested by: Rocco Serene, MD [place],  [place] [address] PCP: Rocco Serene, MD   Assessment & Plan: Visit Diagnoses:  1. Unilateral primary osteoarthritis, right knee   2. Hx of total knee replacement, left     Plan: Impression is #1 right knee advanced generative joint disease. #2 status post left total knee replacement. In regards to the right knee, we injected this with cortisone today. We will follow with [place] as needed. In regards to the left knee, he will continue with his home exercise program. Dental prophylaxis reinforced. Follow-up with [place] in 3 months time for repeat evaluation and 2 view x-rays of the left knee.  Follow-Up Instructions: Return in about 3 months (around 05/23/2020).   Orders:  Orders Placed This Encounter  Procedures  . Large Joint Inj: R knee   No orders of the defined types were placed in this encounter.     Procedures: Large Joint Inj: R knee on 02/21/2020 1:17 PM Indications: pain Details: 22 G needle, anterolateral approach Medications: 2 mL lidocaine 1 %; 2 mL bupivacaine 0.25 %; 40 mg methylPREDNISolone acetate 40 MG/ML      Clinical Data: No additional findings.   Subjective: Chief Complaint  Patient presents with  . Right Knee - Pain  . Left Knee - Follow-up    HPI Douglas Carpenter is a very pleasant 64 year old gentleman who comes in today with right knee pain. History of advanced degenerative joint disease to the right knee. He has been getting intermittent cortisone injections by [place] for the past year or so. His last injection was over 3 months ago with moderate relief of symptoms. His pain has returned and is occurring to the entire knee. He describes this as a constant ache worse with activity. He is taking over-the-counter pain medications without significant relief of symptoms. He  is also approximately 3 months out left total knee replacement. Doing well there. He continues to work on a home exercise program. He does note occasional pain in the medial aspect but this appears to be improving with time.  Review of Systems as detailed in HPI. All others reviewed and are negative.   Objective: Vital Signs: There were no vitals taken for this visit.  Physical Exam well-developed well-nourished gentleman no acute distress. Alert and oriented x3.  Ortho Exam examination of the right knee shows range of motion from 0 to 125 degrees. Medial and lateral joint tenderness remodel patellofemoral crepitus. Ligaments are stable. Left knee exam shows a fully healed surgical scar without complication. Range of motion 0 to 120 degrees. Stable valgus varus stress. Is neurovascular intact distally.  Specialty Comments:  No specialty comments available.  Imaging: No new imaging   PMFS History: Patient Active Problem List   Diagnosis Date Noted  . Status post total left knee replacement 12/02/2019  . Primary osteoarthritis of left knee 04/16/2019  . Primary osteoarthritis of right knee 02/20/2019  . Exercise-induced asthma 07/21/2014  . Chronic cough 07/21/2014  . Upper airway cough syndrome 07/21/2014  . Allergic rhinitis 07/21/2014  . Lumbar stenosis 11/06/2013  . Urethral stricture 01/07/2013  . Prostatic hypertrophy 11/12/2012  . VOCAL CORD DISORDER 02/19/2009  . HYPERLIPIDEMIA 01/27/2009  .  Snoring 01/27/2009  . HYPERTENSION 01/27/2009  . CHRONIC RHINITIS 01/27/2009  . Asthma 01/27/2009  . GERD 01/27/2009   Past Medical History:  Diagnosis Date  . Allergy   . Arthritis    "everywhere"  . BPH (benign prostatic hypertrophy)   . Chronic rhinitis 01/27/2009  . ED (erectile dysfunction)   . GERD (gastroesophageal reflux disease)   . Headache(784.0)    migraines, as many as 3 in a week    . HYPERLIPIDEMIA 01/27/2009  . HYPERTENSION 01/27/2009  . Mild asthma     seasonal allergies, uses Proair once per yr.   . Mild obstructive sleep apnea    per pt -- mild osa test result--  no rx cpap needed  . Neuromuscular disorder ([place])   . Sleep apnea    no cpap  . Testosterone deficiency   . Urethral stricture   . Vocal cord anomaly    pt states told from [place]  doctor heard a little gurgle sound -- no farther testing done    Family History  Problem Relation Age of Onset  . Cancer Mother        lung  . Lung cancer Mother   . Heart disease Maternal Grandmother   . Heart disease Maternal Grandfather   . Cancer Maternal Grandfather        colon, prostate,lung  . Colon cancer Neg Hx   . Colon polyps Neg Hx   . Esophageal cancer Neg Hx   . Rectal cancer Neg Hx   . Stomach cancer Neg Hx     Past Surgical History:  Procedure Laterality Date  . BACK SURGERY    . COLONOSCOPY    . CYSTOSCOPY WITH URETHRAL DILATATION N/A 01/07/2013   Procedure: CYSTOSCOPY WITH URETHRAL DILATATION BALLOON DILATION ;  Surgeon: Bernestine Amass, MD;  Location: [facility];  Service: Urology;  Laterality: N/A;  . LUMBAR LAMINECTOMY/DECOMPRESSION MICRODISCECTOMY Right 11/29/2012   Procedure: Right Lumbar Four to Five Laminectomy/ Decompression Microdiskectomy;  Surgeon: Otilio Connors, MD;  Location: [place] [facility];  Service: Neurosurgery;  Laterality: Right;  LUMBAR LAMINECTOMY/DECOMPRESSION MICRODISCECTOMY 1 LEVEL  . POLYPECTOMY    . SHOULDER ARTHROSCOPY W/ ACROMIAL REPAIR Left 08-28-2002  . TOTAL KNEE ARTHROPLASTY Left 12/02/2019  . TOTAL KNEE ARTHROPLASTY Left 12/02/2019   Procedure: LEFT TOTAL KNEE ARTHROPLASTY;  Surgeon: Leandrew Koyanagi, MD;  Location: [facility];  Service: Orthopedics;  Laterality: Left;  . UPPER GASTROINTESTINAL ENDOSCOPY     Social History   Occupational History  . Occupation: retired  Tobacco Use  . Smoking status: Former Smoker    Packs/day: 0.50    Years: 22.00    Pack years: 11.00    Types: Cigarettes    Quit date: 06/27/1988    Years since  quitting: 31.6  . Smokeless tobacco: Never Used  Vaping Use  . Vaping Use: Never used  Substance and Sexual Activity  . Alcohol use: Yes    Alcohol/week: 3.0 standard drinks    Types: 3 Standard drinks or equivalent per week    Comment: 4-5 drinks / day- cognac   . Drug use: No  . Sexual activity: Not on file

## 2020-02-27 ENCOUNTER — Ambulatory Visit: Payer: Medicare PPO | Admitting: Orthopaedic Surgery

## 2020-04-14 ENCOUNTER — Other Ambulatory Visit: Payer: Medicare PPO

## 2020-04-14 DIAGNOSIS — Z20822 Contact with and (suspected) exposure to covid-19: Secondary | ICD-10-CM

## 2020-04-16 LAB — NOVEL CORONAVIRUS, NAA: SARS-CoV-2, NAA: NOT DETECTED

## 2020-04-16 LAB — SARS-COV-2, NAA 2 DAY TAT

## 2020-05-14 ENCOUNTER — Other Ambulatory Visit: Payer: Self-pay

## 2020-05-14 ENCOUNTER — Other Ambulatory Visit: Payer: Medicare PPO

## 2020-05-14 DIAGNOSIS — Z20822 Contact with and (suspected) exposure to covid-19: Secondary | ICD-10-CM

## 2020-05-15 LAB — NOVEL CORONAVIRUS, NAA: SARS-CoV-2, NAA: NOT DETECTED

## 2020-05-15 LAB — SARS-COV-2, NAA 2 DAY TAT

## 2020-05-26 ENCOUNTER — Encounter: Payer: Self-pay | Admitting: Orthopaedic Surgery

## 2020-05-26 ENCOUNTER — Ambulatory Visit (INDEPENDENT_AMBULATORY_CARE_PROVIDER_SITE_OTHER): Payer: Medicare PPO

## 2020-05-26 ENCOUNTER — Other Ambulatory Visit: Payer: Self-pay

## 2020-05-26 ENCOUNTER — Ambulatory Visit (INDEPENDENT_AMBULATORY_CARE_PROVIDER_SITE_OTHER): Payer: Medicare PPO | Admitting: Orthopaedic Surgery

## 2020-05-26 VITALS — Ht 67.0 in | Wt 228.0 lb

## 2020-05-26 DIAGNOSIS — Z96652 Presence of left artificial knee joint: Secondary | ICD-10-CM | POA: Diagnosis not present

## 2020-05-26 DIAGNOSIS — M1711 Unilateral primary osteoarthritis, right knee: Secondary | ICD-10-CM | POA: Diagnosis not present

## 2020-05-26 MED ORDER — LIDOCAINE HCL 1 % IJ SOLN
2.0000 mL | INTRAMUSCULAR | Status: AC | PRN
  Administered 2020-05-26: 2 mL

## 2020-05-26 MED ORDER — BUPIVACAINE HCL 0.25 % IJ SOLN
2.0000 mL | INTRAMUSCULAR | Status: AC | PRN
  Administered 2020-05-26: 2 mL via INTRA_ARTICULAR

## 2020-05-26 MED ORDER — METHYLPREDNISOLONE ACETATE 40 MG/ML IJ SUSP
40.0000 mg | INTRAMUSCULAR | Status: AC | PRN
  Administered 2020-05-26: 40 mg via INTRA_ARTICULAR

## 2020-05-26 NOTE — Progress Notes (Signed)
Office Visit Note   Patient: Douglas Carpenter           Date of Birth: 03-15-1956           MRN: [id number] Visit Date: 05/26/2020              Requested by: Rocco Serene, MD [place],  [place] [address] PCP: Rocco Serene, MD   Assessment & Plan: Visit Diagnoses:  1. Hx of total knee replacement, left     Plan: Impression is status post left total knee replacement and advanced degenerative joint disease of the right knee.  In regards to the left knee, he is doing well.  Dental prophylaxis reinforced.  He'll follow up with [place] in 6 months when he is 1 year out from surgery for two view x-rays of the left knee.  In regards to the right knee, we reinjected this with cortisone today.  He would like to wait until he is fully recovered from the left knee before proceeding with right total knee replacement.  He'll follow up with [place] as needed for that.  Follow-Up Instructions: No follow-ups on file.   Orders:  Orders Placed This Encounter  Procedures  . Large Joint Inj  . XR Knee 1-2 Views Left   No orders of the defined types were placed in this encounter.     Procedures: Large Joint Inj: R knee on 05/26/2020 9:31 AM Indications: pain Details: 22 G needle, anterolateral approach Medications: 2 mL lidocaine 1 %; 2 mL bupivacaine 0.25 %; 40 mg methylPREDNISolone acetate 40 MG/ML      Clinical Data: No additional findings.   Subjective: Chief Complaint  Patient presents with  . Left Knee - Follow-up    Left TKA 12/02/2019    HPI patient is a pleasant 64 year old gentleman who comes in today 6 months out left total knee replacement 12/02/2019.  He has been doing very well.  His only complaint is that he is having a hard time kneeling on the left knee.  He is also having recurrent right knee pain.  History of advanced degenerative joint disease there for which we have injected with cortisone in the past.  Last injection was in August of this past year.  He had moderate  relief until recently.  He is requesting another injection today.  Review of Systems as detailed in HPI.  All others reviewed and are negative.   Objective: Vital Signs: Ht 5\' 7"  (1.702 m)   Wt 228 lb (103.4 kg)   BMI 35.71 kg/m   Physical Exam well-developed well-nourished gentleman in no acute distress.  Alert and oriented x3.  Ortho Exam left knee exam shows range of motion of 0 to 125 degrees.  Stable varus varus stress.  He is neurovascular intact distally.  Right knee exam is stable.  Specialty Comments:  No specialty comments available.  Imaging: No new imaging   PMFS History: Patient Active Problem List   Diagnosis Date Noted  . Status post total left knee replacement 12/02/2019  . Primary osteoarthritis of left knee 04/16/2019  . Primary osteoarthritis of right knee 02/20/2019  . Exercise-induced asthma 07/21/2014  . Chronic cough 07/21/2014  . Upper airway cough syndrome 07/21/2014  . Allergic rhinitis 07/21/2014  . Lumbar stenosis 11/06/2013  . Urethral stricture 01/07/2013  . Prostatic hypertrophy 11/12/2012  . VOCAL CORD DISORDER 02/19/2009  . HYPERLIPIDEMIA 01/27/2009  . Snoring 01/27/2009  . HYPERTENSION 01/27/2009  . CHRONIC RHINITIS 01/27/2009  .  Asthma 01/27/2009  . GERD 01/27/2009   Past Medical History:  Diagnosis Date  . Allergy   . Arthritis    "everywhere"  . BPH (benign prostatic hypertrophy)   . Chronic rhinitis 01/27/2009  . ED (erectile dysfunction)   . GERD (gastroesophageal reflux disease)   . Headache(784.0)    migraines, as many as 3 in a week    . HYPERLIPIDEMIA 01/27/2009  . HYPERTENSION 01/27/2009  . Mild asthma    seasonal allergies, uses Proair once per yr.   . Mild obstructive sleep apnea    per pt -- mild osa test result--  no rx cpap needed  . Neuromuscular disorder ([place])   . Sleep apnea    no cpap  . Testosterone deficiency   . Urethral stricture   . Vocal cord anomaly    pt states told from [place]  doctor heard a little  gurgle sound -- no farther testing done    Family History  Problem Relation Age of Onset  . Cancer Mother        lung  . Lung cancer Mother   . Heart disease Maternal Grandmother   . Heart disease Maternal Grandfather   . Cancer Maternal Grandfather        colon, prostate,lung  . Colon cancer Neg Hx   . Colon polyps Neg Hx   . Esophageal cancer Neg Hx   . Rectal cancer Neg Hx   . Stomach cancer Neg Hx     Past Surgical History:  Procedure Laterality Date  . BACK SURGERY    . COLONOSCOPY    . CYSTOSCOPY WITH URETHRAL DILATATION N/A 01/07/2013   Procedure: CYSTOSCOPY WITH URETHRAL DILATATION BALLOON DILATION ;  Surgeon: Bernestine Amass, MD;  Location[facility];  Service: Urology;  Laterality: N/A;  . LUMBAR LAMINECTOMY/DECOMPRESSION MICRODISCECTOMY Right 11/29/2012   Procedure: Right Lumbar Four to Five Laminectomy/ Decompression Microdiskectomy;  Surgeon: Otilio Connors, MD;  Location: [place] [facility];  Service: Neurosurgery;  Laterality: Right;  LUMBAR LAMINECTOMY/DECOMPRESSION MICRODISCECTOMY 1 LEVEL  . POLYPECTOMY    . SHOULDER ARTHROSCOPY W/ ACROMIAL REPAIR Left 08-28-2002  . TOTAL KNEE ARTHROPLASTY Left 12/02/2019  . TOTAL KNEE ARTHROPLASTY Left 12/02/2019   Procedure: LEFT TOTAL KNEE ARTHROPLASTY;  Surgeon: Leandrew Koyanagi, MD;  Location: [facility];  Service: Orthopedics;  Laterality: Left;  . UPPER GASTROINTESTINAL ENDOSCOPY     Social History   Occupational History  . Occupation: retired  Tobacco Use  . Smoking status: Former Smoker    Packs/day: 0.50    Years: 22.00    Pack years: 11.00    Types: Cigarettes    Quit date: 06/27/1988    Years since quitting: 31.9  . Smokeless tobacco: Never Used  Vaping Use  . Vaping Use: Never used  Substance and Sexual Activity  . Alcohol use: Yes    Alcohol/week: 3.0 standard drinks    Types: 3 Standard drinks or equivalent per week    Comment: 4-5 drinks / day- cognac   . Drug use: No  . Sexual activity: Not on file

## 2020-07-08 ENCOUNTER — Ambulatory Visit (INDEPENDENT_AMBULATORY_CARE_PROVIDER_SITE_OTHER): Payer: Medicare PPO | Admitting: Pulmonary Disease

## 2020-07-08 ENCOUNTER — Encounter: Payer: Self-pay | Admitting: Pulmonary Disease

## 2020-07-08 ENCOUNTER — Other Ambulatory Visit: Payer: Self-pay

## 2020-07-08 VITALS — BP 130/78 | HR 71 | Ht 67.0 in | Wt 240.2 lb

## 2020-07-08 DIAGNOSIS — J4540 Moderate persistent asthma, uncomplicated: Secondary | ICD-10-CM | POA: Diagnosis not present

## 2020-07-08 DIAGNOSIS — R0683 Snoring: Secondary | ICD-10-CM

## 2020-07-08 DIAGNOSIS — J301 Allergic rhinitis due to pollen: Secondary | ICD-10-CM

## 2020-07-08 DIAGNOSIS — Z0000 Encounter for general adult medical examination without abnormal findings: Secondary | ICD-10-CM | POA: Diagnosis not present

## 2020-07-08 MED ORDER — MONTELUKAST SODIUM 10 MG PO TABS: 10.0000 mg | ORAL_TABLET | Freq: Every day | ORAL | 11 refills | Status: AC

## 2020-07-08 MED ORDER — ADVAIR HFA 230-21 MCG/ACT IN AERO
2.0000 | INHALATION_SPRAY | Freq: Two times a day (BID) | RESPIRATORY_TRACT | 11 refills | Status: DC
Start: 2020-07-08 — End: 2020-07-08

## 2020-07-08 MED ORDER — ADVAIR HFA 230-21 MCG/ACT IN AERO
2.0000 | INHALATION_SPRAY | Freq: Two times a day (BID) | RESPIRATORY_TRACT | 11 refills | Status: DC
Start: 2020-07-08 — End: 2021-02-02

## 2020-07-08 MED ORDER — MONTELUKAST SODIUM 10 MG PO TABS
10.0000 mg | ORAL_TABLET | Freq: Every day | ORAL | 11 refills | Status: DC
Start: 2020-07-08 — End: 2020-07-08

## 2020-07-08 NOTE — Progress Notes (Signed)
[facility]  Chief Complaint  Patient presents with  . Follow-up    Pt states he has been doing okay since last visit. Pt denies any recent flare ups with his asthma. Does state that he has had some wheezing and also has had some snoring due to being out of his advair diskus inhaler.    Constitutional:  BP 130/78 (BP Location: Left Arm, Cuff Size: Large)   Pulse 71   Ht 5\' 7"  (1.702 m)   Wt 240 lb 3.2 oz (109 kg)   SpO2 99%   BMI 37.62 kg/m   Past Medical History:  Low T, HTN, HLD, HA, GERD, BPH, OA  Past Surgical History:  He  has a past surgical history that includes Lumbar laminectomy/decompression microdiscectomy (Right, 11/29/2012); Shoulder arthroscopy w/ acromial repair (Left, 08-28-2002); Cystoscopy with urethral dilatation (N/A, 01/07/2013); Colonoscopy; Polypectomy; Upper gastrointestinal endoscopy; Back surgery; Total knee arthroplasty (Left, 12/02/2019); and Total knee arthroplasty (Left, 12/02/2019).  Brief Summary:  Douglas Carpenter is a 65 y.o. male former smoker with chronic cough in setting of Allergic asthma, Post nasal drip, GERD, and vocal cord disease. He also has hx of snoring.      Subjective:   He ran out of advair and singulair.  [facility].  Has more wheezing and chest congestion.  Also snoring more at night.  Has to sleep in a recliner.  Awaiting on adjustable bed.  Was treated for a sty.  Gets redness in his eyes intermittently.  Physical Exam:   Appearance - well kempt   ENMT - no sinus tenderness, no oral exudate, no LAN, Mallampati 3 airway, no stridor  Respiratory - equal breath sounds bilaterally, no wheezing or rales  CV - s1s2 regular rate and rhythm, no murmurs  Ext - no clubbing, no edema  Skin - no rashes  Psych - normal mood and affect   Pulmonary testing:   PFT 02/19/09 >> FEV1 2.90 (90%), FEV1% 70, TLC 5.62(93%), DLCO 99%, +BD  PFT 07/21/14 >> FEV1 2.70  (93%), FEV1% 74, TLC 5.97 (91%), RV 2.65 (126%), DLCO 106%, +BD  RAST 07/21/14 >> react to dust mites, grasses; IgE 49  FeNO 10/27/15 >> 228    Sleep Tests:   HST 03/07/13 >> AHI 3.9, SaO2 low 80%  Social History:  He  reports that he quit smoking about 32 years ago. His smoking use included cigarettes. He has a 11.00 pack-year smoking history. He has never used smokeless tobacco. He reports current alcohol use of about 3.0 standard drinks of alcohol per week. He reports that he does not use drugs.  Family History:  His family history includes Cancer in his maternal grandfather and mother; Heart disease in his maternal grandfather and maternal grandmother; Lung cancer in his mother.    Discussion:    Assessment/Plan:   Allergic asthma with exercise induced bronchoconstriction. - printed script for advair and singulair to get refills from the [place] - prn albuterol  Upper airway cough syndrome. - continue singulair and prn claritin  Allergic conjunctivitis. - prn pataday  Snoring. - if his sleep issues persist after he is on regular use of advair, then he is to call to arrange for home sleep study  Healthcare maintenance. - will arrange for new primary care provider per his request  Time Spent Involved in Patient Care on Day of Examination:  24 minutes  Follow up:  Patient Instructions  Call if you have trouble getting refills at the [facility] for your asthma medications  Will arrange for new primary care provider  Follow up in 6 months   Medication List:   Allergies as of 07/08/2020      Reactions   Aspirin Rash   Pt can tolerate low doses-- INTOLERANT TO HIGHER DOSES      Medication List       Accurate as of July 08, 2020 10:14 AM. If you have any questions, ask your nurse or doctor.        STOP taking these medications   allopurinol 300 MG tablet Commonly known as: ZYLOPRIM Stopped by: [name], MD   methocarbamol 750 MG tablet Commonly known as:  ROBAXIN Stopped by: [name], MD   ondansetron 4 MG tablet Commonly known as: ZOFRAN Stopped by: [name], MD   oxyCODONE 5 MG immediate release tablet Commonly known as: Oxy IR/ROXICODONE Stopped by: [name], MD   senna-docusate 8.6-50 MG tablet Commonly known as: Senokot S Stopped by: [name], MD     TAKE these medications   Advair HFA 230-21 MCG/ACT inhaler Generic drug: fluticasone-salmeterol Inhale 2 puffs into the lungs 2 (two) times daily.   Aimovig 70 MG/ML Soaj Generic drug: Erenumab-aooe Inject 70 mg into the skin every 30 (thirty) days.   albuterol 108 (90 Base) MCG/ACT inhaler Commonly known as: ProAir HFA Inhale 2 puffs into the lungs every 6 (six) hours as needed for wheezing or shortness of breath.   aspirin EC 81 MG tablet Take 1 tablet (81 mg total) by mouth 2 (two) times daily.   atenolol 50 MG tablet Commonly known as: TENORMIN Take 50 mg by mouth every morning.   atorvastatin 80 MG tablet Commonly known as: LIPITOR Take 80 mg by mouth at bedtime.   cyclobenzaprine 10 MG tablet Commonly known as: FLEXERIL Take 0.5-1 tablets (5-10 mg total) by mouth 3 (three) times daily as needed for muscle spasms.   diclofenac 75 MG EC tablet Commonly known as: VOLTAREN Take 75 mg by mouth daily.   DULoxetine 60 MG capsule Commonly known as: Cymbalta Take 1 capsule (60 mg total) by mouth daily.   Fish Oil 1000 MG Cpdr Take 1,000 mg by mouth 2 (two) times daily.   gabapentin 600 MG tablet Commonly known as: NEURONTIN Take 600 mg by mouth 3 (three) times daily.   meloxicam 15 MG tablet Commonly known as: MOBIC Take 15 mg by mouth daily.   montelukast 10 MG tablet Commonly known as: SINGULAIR Take 1 tablet (10 mg total) by mouth at bedtime.   olopatadine 0.1 % ophthalmic solution Commonly known as: Pataday Place 1 drop into both eyes 2 (two) times daily.   omeprazole 40 MG capsule Commonly known as: PRILOSEC Take 40 mg by mouth  daily.   triamcinolone 0.1 % Commonly known as: KENALOG Apply 1 application topically 3 (three) times daily. As needed to hand.       Signature:  [name], MD [place] Pager - [phone] 07/08/2020, 10:14 AM

## 2020-07-08 NOTE — Patient Instructions (Signed)
Call if you have trouble getting refills at the [facility] for your asthma medications  Will arrange for new primary care provider  Follow up in 6 months

## 2020-07-11 IMAGING — DX DG KNEE 1-2V PORT*L*
1 series · 2 of 2 positions shown · non-contrast
Comparison: 08/01/2019.

CLINICAL DATA: 63-year-old male status post left knee arthroplasty.

EXAM:
PORTABLE LEFT KNEE - 1-2 VIEW

[Series 1: knee · 0.14mm/px · 2 of 2 slices shown]
[im 1/2]
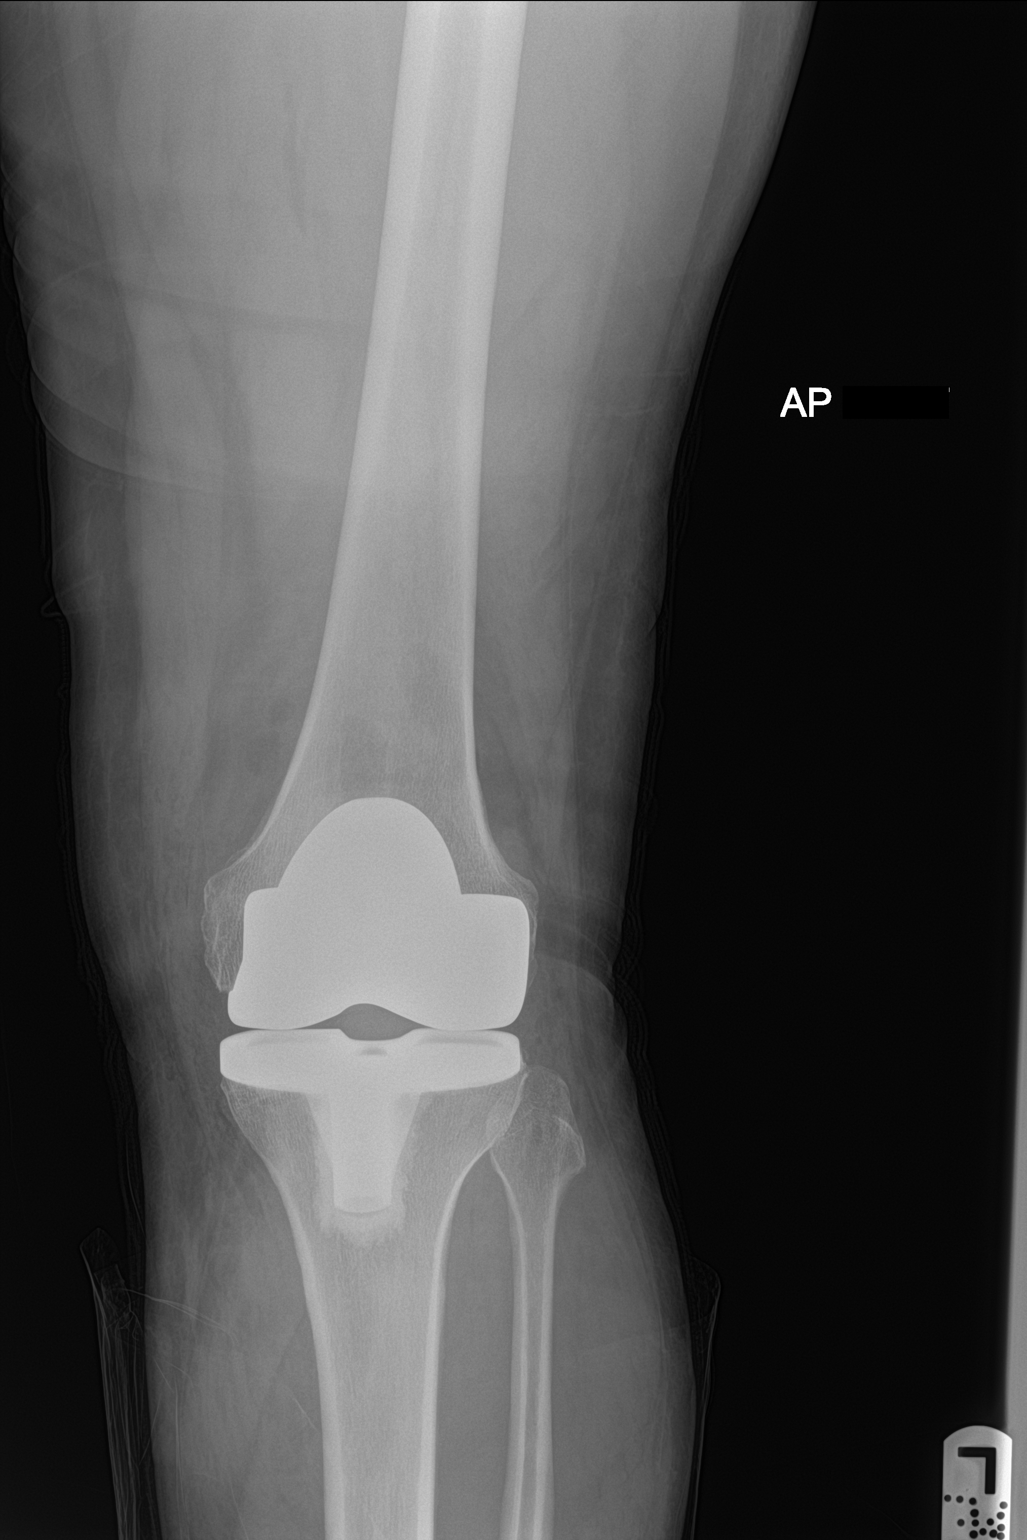
[im 2/2]
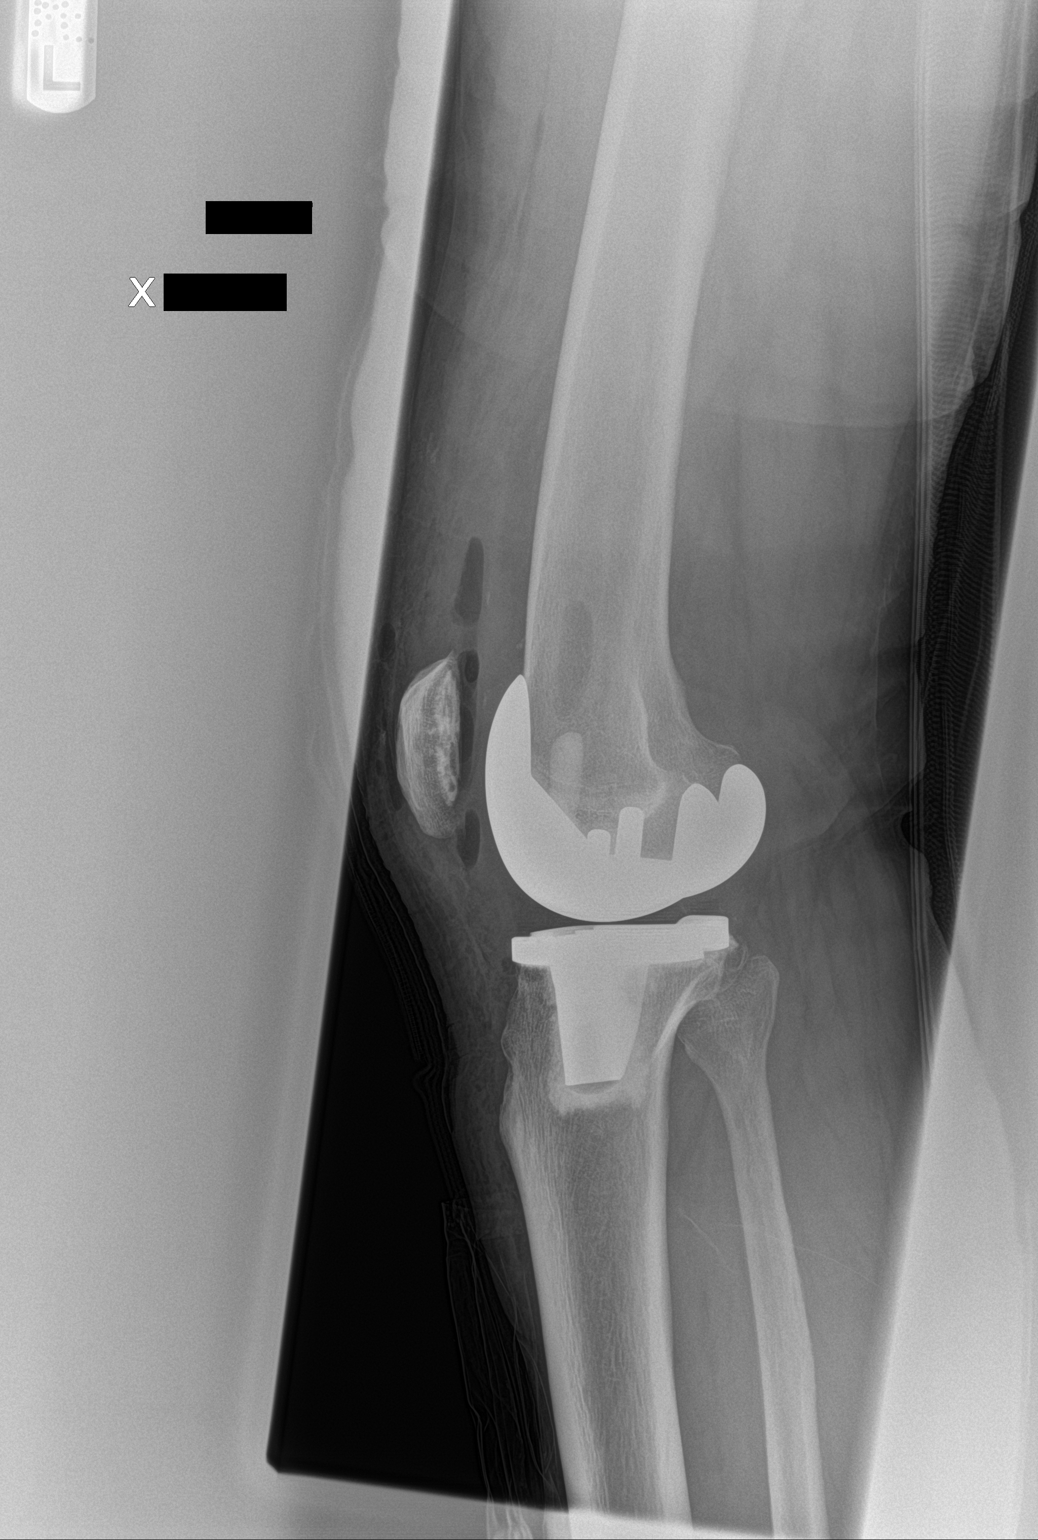

[2 of 2 positions shown; findings below may reference images not displayed]

FINDINGS: Portable AP and cross-table lateral views at 1440 hours. Total knee
arthroplasty hardware is in place and normally aligned. No adverse
hardware features. Small volume postoperative gas and fluid in the
joint space. Postoperative changes to the patella. No unexpected
osseous changes.
IMPRESSION: Left total knee arthroplasty with no adverse features.

## 2020-07-16 ENCOUNTER — Other Ambulatory Visit: Payer: Medicare PPO

## 2020-07-16 DIAGNOSIS — Z20822 Contact with and (suspected) exposure to covid-19: Secondary | ICD-10-CM

## 2020-07-17 LAB — NOVEL CORONAVIRUS, NAA: SARS-CoV-2, NAA: DETECTED — AB

## 2020-07-17 LAB — SARS-COV-2, NAA 2 DAY TAT

## 2020-07-20 ENCOUNTER — Telehealth: Payer: Self-pay | Admitting: Orthopaedic Surgery

## 2020-07-20 NOTE — Telephone Encounter (Signed)
Mee called for pt. She is calling about his medical supplies. Please call her at [phone] EXT [phone]

## 2020-07-23 ENCOUNTER — Telehealth: Payer: Self-pay | Admitting: Pulmonary Disease

## 2020-07-23 MED ORDER — PREDNISONE 10 MG PO TABS: ORAL_TABLET | ORAL | 0 refills | Status: AC

## 2020-07-23 NOTE — Telephone Encounter (Signed)
Called Mee back no answer LMOM to return our call. Not sure what Is needed. Need more details.

## 2020-07-23 NOTE — Telephone Encounter (Signed)
Called and spoke with patients wife who states that husband tested + for covid on (07/16/20); no hospitalization;  no fever, wheezing but has asthma, was coughing mucus, was taking over the counter mucinex. Fully vaccinated w/booster. Wife's test came back negative. Got results on 07/19/20 that his test was positive so patient went and stayed in RV and then came back into the house Wednesday. Productive cough that was yellow and is now getting clear.   Dr. Halford Chessman please advise with any further recommendations and if think patient is ok to be back in the house.

## 2020-07-23 NOTE — Telephone Encounter (Signed)
He is passed window to be candidate for any specific outpatient therapies for COVID.  If he is still having productive cough and wheezing, then he might need a course of prednisone.  Can send script for prednisone 10 mg >> 3 pills daily for 2 days, 2 pills daily 2 days, 1 pill daily for 2 days.

## 2020-07-23 NOTE — Telephone Encounter (Signed)
Called and spoke with wife Vivien Rota to let her know of recs from Dr. Halford Chessman. Patient's wife expressed frustration with [facility] as they never mentioned any outpatient recommendations, antibody infusion, etc they just told them he needed to quarantine. Expressed she would like to have the RX sent to pharmacy and that if she needed anything or had any further questions she would call the office back. Rx sent to preferred pharmacy. Nothing further needed at this time.

## 2020-08-03 ENCOUNTER — Ambulatory Visit (INDEPENDENT_AMBULATORY_CARE_PROVIDER_SITE_OTHER): Payer: Medicare PPO

## 2020-08-03 ENCOUNTER — Encounter: Payer: Self-pay | Admitting: Internal Medicine

## 2020-08-03 ENCOUNTER — Other Ambulatory Visit: Payer: Self-pay

## 2020-08-03 ENCOUNTER — Ambulatory Visit (INDEPENDENT_AMBULATORY_CARE_PROVIDER_SITE_OTHER): Payer: Medicare PPO | Admitting: Internal Medicine

## 2020-08-03 VITALS — BP 156/96 | HR 82 | Temp 98.6°F | Resp 16 | Ht 67.0 in | Wt 244.0 lb

## 2020-08-03 DIAGNOSIS — R059 Cough, unspecified: Secondary | ICD-10-CM | POA: Insufficient documentation

## 2020-08-03 DIAGNOSIS — N1831 Chronic kidney disease, stage 3a: Secondary | ICD-10-CM

## 2020-08-03 DIAGNOSIS — E785 Hyperlipidemia, unspecified: Secondary | ICD-10-CM | POA: Diagnosis not present

## 2020-08-03 DIAGNOSIS — N400 Enlarged prostate without lower urinary tract symptoms: Secondary | ICD-10-CM | POA: Diagnosis not present

## 2020-08-03 DIAGNOSIS — Z0000 Encounter for general adult medical examination without abnormal findings: Secondary | ICD-10-CM | POA: Diagnosis not present

## 2020-08-03 DIAGNOSIS — I10 Essential (primary) hypertension: Secondary | ICD-10-CM | POA: Diagnosis not present

## 2020-08-03 DIAGNOSIS — Z23 Encounter for immunization: Secondary | ICD-10-CM | POA: Diagnosis not present

## 2020-08-03 DIAGNOSIS — R739 Hyperglycemia, unspecified: Secondary | ICD-10-CM | POA: Diagnosis not present

## 2020-08-03 DIAGNOSIS — R7303 Prediabetes: Secondary | ICD-10-CM | POA: Insufficient documentation

## 2020-08-03 DIAGNOSIS — U071 COVID-19: Secondary | ICD-10-CM | POA: Diagnosis not present

## 2020-08-03 DIAGNOSIS — Z0001 Encounter for general adult medical examination with abnormal findings: Secondary | ICD-10-CM

## 2020-08-03 DIAGNOSIS — R972 Elevated prostate specific antigen [PSA]: Secondary | ICD-10-CM

## 2020-08-03 LAB — CBC WITH DIFFERENTIAL/PLATELET
Basophils Absolute: 0 10*3/uL (ref 0.0–0.1)
Basophils Relative: 0.6 % (ref 0.0–3.0)
Eosinophils Absolute: 0.1 10*3/uL (ref 0.0–0.7)
Eosinophils Relative: 2.3 % (ref 0.0–5.0)
HCT: 42 % (ref 39.0–52.0)
Hemoglobin: 14 g/dL (ref 13.0–17.0)
Lymphocytes Relative: 27.8 % (ref 12.0–46.0)
Lymphs Abs: 1.6 10*3/uL (ref 0.7–4.0)
MCHC: 33.4 g/dL (ref 30.0–36.0)
MCV: 88.4 fl (ref 78.0–100.0)
Monocytes Absolute: 0.7 10*3/uL (ref 0.1–1.0)
Monocytes Relative: 12.2 % — ABNORMAL HIGH (ref 3.0–12.0)
Neutro Abs: 3.4 10*3/uL (ref 1.4–7.7)
Neutrophils Relative %: 57.1 % (ref 43.0–77.0)
Platelets: 202 10*3/uL (ref 150.0–400.0)
RBC: 4.75 Mil/uL (ref 4.22–5.81)
RDW: 14.6 % (ref 11.5–15.5)
WBC: 5.9 10*3/uL (ref 4.0–10.5)

## 2020-08-03 LAB — VITAMIN D 25 HYDROXY (VIT D DEFICIENCY, FRACTURES): VITD: 35.15 ng/mL (ref 30.00–100.00)

## 2020-08-03 LAB — TSH: TSH: 1.69 u[IU]/mL (ref 0.35–4.50)

## 2020-08-03 LAB — BASIC METABOLIC PANEL
BUN: 22 mg/dL (ref 6–23)
CO2: 27 mEq/L (ref 19–32)
Calcium: 9.2 mg/dL (ref 8.4–10.5)
Chloride: 106 mEq/L (ref 96–112)
Creatinine, Ser: 1.54 mg/dL — ABNORMAL HIGH (ref 0.40–1.50)
GFR: 47.16 mL/min — ABNORMAL LOW (ref >60.00)
Glucose, Bld: 116 mg/dL — ABNORMAL HIGH (ref 70–99)
Potassium: 3.8 mEq/L (ref 3.5–5.1)
Sodium: 141 mEq/L (ref 135–145)

## 2020-08-03 LAB — LIPID PANEL
Cholesterol: 218 mg/dL — ABNORMAL HIGH (ref 0–200)
HDL: 64 mg/dL (ref >39.00)
LDL Cholesterol: 124 mg/dL — ABNORMAL HIGH (ref 0–99)
NonHDL: 154.07
Total CHOL/HDL Ratio: 3
Triglycerides: 149 mg/dL (ref 0.0–149.0)
VLDL: 29.8 mg/dL (ref 0.0–40.0)

## 2020-08-03 LAB — HEPATIC FUNCTION PANEL
ALT: 26 U/L (ref 0–53)
AST: 21 U/L (ref 0–37)
Albumin: 4.2 g/dL (ref 3.5–5.2)
Alkaline Phosphatase: 44 U/L (ref 39–117)
Bilirubin, Direct: 0.1 mg/dL (ref 0.0–0.3)
Total Bilirubin: 0.6 mg/dL (ref 0.2–1.2)
Total Protein: 7.3 g/dL (ref 6.0–8.3)

## 2020-08-03 LAB — PSA: PSA: 3.95 ng/mL (ref 0.10–4.00)

## 2020-08-03 LAB — HEMOGLOBIN A1C: Hgb A1c MFr Bld: 6.3 % (ref 4.6–6.5)

## 2020-08-03 MED ORDER — CARVEDILOL 6.25 MG PO TABS: 6.2500 mg | ORAL_TABLET | Freq: Two times a day (BID) | ORAL | 1 refills | Status: DC

## 2020-08-03 MED ORDER — INDAPAMIDE 1.25 MG PO TABS: 1.2500 mg | ORAL_TABLET | Freq: Every day | ORAL | 0 refills | Status: DC

## 2020-08-03 NOTE — Patient Instructions (Signed)

## 2020-08-03 NOTE — Progress Notes (Signed)
Subjective:  Patient ID: Douglas Carpenter, male    DOB: 09-Oct-1955  Age: 65 y.o. MRN: [id number]  CC: Annual Exam, Hypertension, and Hyperlipidemia  This visit occurred during the SARS-CoV-2 public health emergency.  Safety protocols were in place, including screening questions prior to the visit, additional usage of staff PPE, and extensive cleaning of exam room while observing appropriate contact time as indicated for disinfecting solutions.    HPI Douglas Carpenter presents for a CPX and to establish.  He recently acquired a COVID-19 infection and at the onset had a cough that produced productive of yellow-white phlegm.  He says the cough is improving and is now nonproductive.  He denies chest pain, hemoptysis, shortness of breath, fever, chills, or night sweats. He rarely wheezes. He is not been working on his lifestyle modifications but when he is active he denies CP, DOE, diaphoresis, dizziness, lightheadedness, palpitations, edema, or fatigue.  History Douglas Carpenter has a past medical history of Allergy, Arthritis, BPH (benign prostatic hypertrophy), Chronic rhinitis (01/27/2009), ED (erectile dysfunction), GERD (gastroesophageal reflux disease), Headache(784.0), HYPERLIPIDEMIA (01/27/2009), HYPERTENSION (01/27/2009), Mild asthma, Mild obstructive sleep apnea, Neuromuscular disorder (Douglas Carpenter), Sleep apnea, Testosterone deficiency, Urethral stricture, and Vocal cord anomaly.   He has a past surgical history that includes Lumbar laminectomy/decompression microdiscectomy (Right, 11/29/2012); Shoulder arthroscopy w/ acromial repair (Left, 08-28-2002); Cystoscopy with urethral dilatation (N/A, 01/07/2013); Colonoscopy; Polypectomy; Upper gastrointestinal endoscopy; Back surgery; Total knee arthroplasty (Left, 12/02/2019); and Total knee arthroplasty (Left, 12/02/2019).   His family history includes Cancer in his maternal grandfather and mother; Heart disease in his maternal grandfather and maternal grandmother; Lung cancer  in his mother.He reports that he quit smoking about 32 years ago. His smoking use included cigarettes. He has a 11.00 pack-year smoking history. He has never used smokeless tobacco. He reports current alcohol use of about 3.0 standard drinks of alcohol per week. He reports that he does not use drugs.  Outpatient Medications Prior to Visit  Medication Sig Dispense Refill  . albuterol (PROAIR HFA) 108 (90 Base) MCG/ACT inhaler Inhale 2 puffs into the lungs every 6 (six) hours as needed for wheezing or shortness of breath. 1 Inhaler 3  . aspirin EC 81 MG tablet Take 1 tablet (81 mg total) by mouth 2 (two) times daily. 84 tablet 0  . DULoxetine (CYMBALTA) 60 MG capsule Take 1 capsule (60 mg total) by mouth daily. 90 capsule 1  . Erenumab-aooe (AIMOVIG) 70 MG/ML SOAJ Inject 70 mg into the skin every 30 (thirty) days.    . fluticasone-salmeterol (ADVAIR HFA) 230-21 MCG/ACT inhaler Inhale 2 puffs into the lungs 2 (two) times daily. 1 each 11  . gabapentin (NEURONTIN) 600 MG tablet Take 600 mg by mouth 3 (three) times daily.     . montelukast (SINGULAIR) 10 MG tablet Take 1 tablet (10 mg total) by mouth at bedtime. 30 tablet 11  . olopatadine (PATADAY) 0.1 % ophthalmic solution Place 1 drop into both eyes 2 (two) times daily. 5 mL 0  . omeprazole (PRILOSEC) 40 MG capsule Take 40 mg by mouth daily.     Marland Kitchen atenolol (TENORMIN) 50 MG tablet Take 50 mg by mouth every morning.    Marland Kitchen atorvastatin (LIPITOR) 80 MG tablet Take 80 mg by mouth at bedtime.     . cyclobenzaprine (FLEXERIL) 10 MG tablet Take 0.5-1 tablets (5-10 mg total) by mouth 3 (three) times daily as needed for muscle spasms. 50 tablet 1  . diclofenac (VOLTAREN) 75 MG EC tablet Take 75 mg  by mouth daily.    . meloxicam (MOBIC) 15 MG tablet Take 15 mg by mouth daily.     . Omega-3 Fatty Acids (FISH OIL) 1000 MG CPDR Take 1,000 mg by mouth 2 (two) times daily.    Marland Kitchen triamcinolone cream (KENALOG) 0.1 % Apply 1 application topically 3 (three) times daily.  As needed to hand. 30 g 0  . albuterol (PROVENTIL) (2.5 MG/3ML) 0.083% nebulizer solution 2.5 mg      No facility-administered medications prior to visit.    ROS Review of Systems  Constitutional: Positive for unexpected weight change (wt gain). Negative for appetite change, chills, diaphoresis, fatigue and fever.  HENT: Negative.   Respiratory: Positive for cough and wheezing. Negative for choking, chest tightness, shortness of breath and stridor.   Cardiovascular: Negative for chest pain, palpitations and leg swelling.  Gastrointestinal: Negative for abdominal pain, blood in stool, constipation, diarrhea, nausea and vomiting.  Endocrine: Negative.   Genitourinary: Positive for scrotal swelling. Negative for decreased urine volume, difficulty urinating, dysuria, genital sores, hematuria, testicular pain and urgency.  Musculoskeletal: Negative.  Negative for arthralgias and myalgias.  Skin: Negative.   Neurological: Negative.  Negative for dizziness, weakness, light-headedness and headaches.  Hematological: Negative for adenopathy. Does not bruise/bleed easily.  Psychiatric/Behavioral: Negative.     Objective:  BP (!) 156/96   Pulse 82   Temp 98.6 F (37 C) (Oral)   Resp 16   Ht 5\' 7"  (1.702 m)   Wt 244 lb (110.7 kg)   SpO2 96%   BMI 38.22 kg/m   Physical Exam Cardiovascular:     Rate and Rhythm: Normal rate and regular rhythm.     Comments: EKG- NSR, 70 bpm TWF in inf/lat leads No Q waves and no LVH Genitourinary:    Pubic Area: No rash.      Penis: Normal and circumcised. No discharge, swelling or lesions.      Testes:        Right: Testicular hydrocele present. Mass, tenderness, swelling or varicocele not present.        Left: Mass, tenderness, swelling, testicular hydrocele or varicocele not present.     Epididymis:     Right: Normal.     Left: Normal.     Prostate: Enlarged. Not tender and no nodules present.     Rectum: Normal. Guaiac result negative. No mass,  tenderness, anal fissure, external hemorrhoid or internal hemorrhoid. Normal anal tone.     Comments: Large right hydrocele    Lab Results  Component Value Date   WBC 5.9 08/03/2020   HGB 14.0 08/03/2020   HCT 42.0 08/03/2020   PLT 202.0 08/03/2020   GLUCOSE 116 (H) 08/03/2020   CHOL 218 (H) 08/03/2020   TRIG 149.0 08/03/2020   HDL 64.00 08/03/2020   LDLCALC 124 (H) 08/03/2020   ALT 26 08/03/2020   AST 21 08/03/2020   NA 141 08/03/2020   K 3.8 08/03/2020   CL 106 08/03/2020   CREATININE 1.54 (H) 08/03/2020   BUN 22 08/03/2020   CO2 27 08/03/2020   TSH 1.69 08/03/2020   PSA 3.95 08/03/2020   INR 1.0 11/28/2019   HGBA1C 6.3 08/03/2020   DG Chest 2 View  Result Date: 08/03/2020 CLINICAL DATA:  Cough, COVID-19 diagnosed 07/17/2020 EXAM: CHEST - 2 VIEW COMPARISON:  11/28/2019 FINDINGS: The heart size and mediastinal contours are within normal limits. Both lungs are clear. The visualized skeletal structures are unremarkable. IMPRESSION: No active cardiopulmonary disease. Electronically Signed   By:  Randa Ngo M.D.   On: 08/03/2020 15:26    Assessment & Plan:   Ayvion was seen today for annual exam, hypertension and hyperlipidemia.  Diagnoses and all orders for this visit:  Primary hypertension- His blood pressure is not adequately well controlled.  His EKG is negative for LVH or ischemia.  His labs are negative for secondary causes but he has endorgan damage with renal insufficiency.  In addition to lifestyle modifications I have asked him to transition from atenolol to carvedilol and to start indapamide for blood pressure control. -     EKG 12-Lead -     CBC with Differential/Platelet; Future -     Basic metabolic panel; Future -     TSH; Future -     VITAMIN D 25 Hydroxy (Vit-D Deficiency, Fractures); Future -     Urinalysis, Routine w reflex microscopic; Future -     Discontinue: indapamide (LOZOL) 1.25 MG tablet; Take 1 tablet (1.25 mg total) by mouth daily. -      carvedilol (COREG) 6.25 MG tablet; Take 1 tablet (6.25 mg total) by mouth 2 (two) times daily with a meal. -     indapamide (LOZOL) 1.25 MG tablet; Take 1 tablet (1.25 mg total) by mouth daily. -     Urinalysis, Routine w reflex microscopic -     VITAMIN D 25 Hydroxy (Vit-D Deficiency, Fractures) -     TSH -     Basic metabolic panel -     CBC with Differential/Platelet  Chronic hyperglycemia- He is prediabetic with an A1c of 6.3%.  Medical therapy is not yet indicated. -     Basic metabolic panel; Future -     Hemoglobin A1c; Future -     Hemoglobin A1c -     Basic metabolic panel  Benign prostatic hyperplasia without lower urinary tract symptoms- His PSA has risen some but he does not have any symptoms that need to be treated.  I have asked him to return in 2 to 3 months to have a recheck his PSA. -     PSA; Future -     Urinalysis, Routine w reflex microscopic; Future -     Urinalysis, Routine w reflex microscopic -     PSA  Hyperlipidemia with target LDL less than 130- He has an elevated ASCVD risk score so I have asked him to take a statin for CV risk reduction. -     Lipid panel; Future -     TSH; Future -     Hepatic function panel; Future -     Hepatic function panel -     TSH -     Lipid panel -     atorvastatin (LIPITOR) 80 MG tablet; Take 1 tablet (80 mg total) by mouth at bedtime.  Cough- Based on his symptoms, exam, and chest x-ray and COVID-19 infection has resolved and there is no evidence of complications. -     DG Chest 2 View; Future  Acute COVID-19 -     DG Chest 2 View; Future  Rising PSA level  Stage 3a chronic kidney disease ([place])- I recommended that he avoid NSAIDs and try to get better control of his blood pressure.  Encounter for general adult medical examination with abnormal findings- Exam completed, labs reviewed, vaccines reviewed and updated, cancer screenings are up-to-date, patient education was given.  Other orders -     Pneumococcal  polysaccharide vaccine 23-valent greater than or equal to 2yo subcutaneous/IM  I have discontinued [name] E. Vanauken's atenolol, diclofenac, Fish Oil, cyclobenzaprine, triamcinolone, and meloxicam. I have also changed his atorvastatin. Additionally, I am having him start on carvedilol. Lastly, I am having him maintain his DULoxetine, albuterol, olopatadine, gabapentin, omeprazole, Aimovig, aspirin EC, Advair HFA, montelukast, and indapamide. We will stop administering albuterol.  Meds ordered this encounter  Medications  . DISCONTD: indapamide (LOZOL) 1.25 MG tablet    Sig: Take 1 tablet (1.25 mg total) by mouth daily.    Dispense:  90 tablet    Refill:  0  . carvedilol (COREG) 6.25 MG tablet    Sig: Take 1 tablet (6.25 mg total) by mouth 2 (two) times daily with a meal.    Dispense:  180 tablet    Refill:  1  . indapamide (LOZOL) 1.25 MG tablet    Sig: Take 1 tablet (1.25 mg total) by mouth daily.    Dispense:  90 tablet    Refill:  0  . atorvastatin (LIPITOR) 80 MG tablet    Sig: Take 1 tablet (80 mg total) by mouth at bedtime.    Dispense:  90 tablet    Refill:  1     Follow-up: Return in about 3 months (around 10/31/2020).  Scarlette Calico, MD

## 2020-08-04 ENCOUNTER — Encounter: Payer: Self-pay | Admitting: Internal Medicine

## 2020-08-04 ENCOUNTER — Telehealth: Payer: Self-pay | Admitting: Internal Medicine

## 2020-08-04 ENCOUNTER — Telehealth: Payer: Self-pay | Admitting: Pulmonary Disease

## 2020-08-04 DIAGNOSIS — R972 Elevated prostate specific antigen [PSA]: Secondary | ICD-10-CM | POA: Insufficient documentation

## 2020-08-04 DIAGNOSIS — N1831 Chronic kidney disease, stage 3a: Secondary | ICD-10-CM | POA: Insufficient documentation

## 2020-08-04 LAB — URINALYSIS, ROUTINE W REFLEX MICROSCOPIC
Bilirubin Urine: NEGATIVE
Hgb urine dipstick: NEGATIVE
Ketones, ur: NEGATIVE
Leukocytes,Ua: NEGATIVE
Nitrite: NEGATIVE
RBC / HPF: NONE SEEN (ref 0–?)
Specific Gravity, Urine: 1.025 (ref 1.000–1.030)
Total Protein, Urine: NEGATIVE
Urine Glucose: NEGATIVE
Urobilinogen, UA: 0.2 (ref 0.0–1.0)
WBC, UA: NONE SEEN (ref 0–?)
pH: 6 (ref 5.0–8.0)

## 2020-08-04 MED ORDER — ATORVASTATIN CALCIUM 80 MG PO TABS
80.0000 mg | ORAL_TABLET | Freq: Every day | ORAL | 1 refills | Status: AC
Start: 2020-08-04 — End: ?

## 2020-08-04 NOTE — Telephone Encounter (Signed)
Pt has been informed.

## 2020-08-04 NOTE — Telephone Encounter (Signed)
° ° °  Please return call to patient °

## 2020-08-04 NOTE — Telephone Encounter (Signed)
   Patient calling to report he tested positive for COVID 08/03/20, seeking advice Patient calling for xray results

## 2020-08-04 NOTE — Telephone Encounter (Signed)
Called and spoke with pts wife and she is aware that since the pt testes positive on Jan 20, it was too early for him to be retested on Feb 7.  She stated that he only still has the cough with clear sputum.  I told her that the cough can linger for a while after having covid.  She is aware and will wait to see the results of his CXR that was done yesterday.  Nothing further is needed.

## 2020-08-05 ENCOUNTER — Encounter: Payer: Self-pay | Admitting: Internal Medicine

## 2020-08-05 DIAGNOSIS — Z0001 Encounter for general adult medical examination with abnormal findings: Secondary | ICD-10-CM | POA: Insufficient documentation

## 2020-08-20 ENCOUNTER — Telehealth: Payer: Self-pay | Admitting: Orthopaedic Surgery

## 2020-08-20 NOTE — Telephone Encounter (Signed)
Patient called advised he was at the dental office. Patient has to reschedule his dental appointment until 08/25/2020. Patient said he had surgery 11/2019. Patient asked if the Rx for antibiotic can be sent to [facility] on [address]? The number to contact patient is [phone]

## 2020-08-21 ENCOUNTER — Other Ambulatory Visit: Payer: Self-pay | Admitting: Physician Assistant

## 2020-08-21 MED ORDER — AMOXICILLIN 500 MG PO CAPS: ORAL_CAPSULE | ORAL | 0 refills | Status: DC

## 2020-08-21 NOTE — Telephone Encounter (Signed)
Sent in

## 2020-08-21 NOTE — Telephone Encounter (Signed)
Patient aware.

## 2020-08-26 ENCOUNTER — Other Ambulatory Visit: Payer: Self-pay | Admitting: Internal Medicine

## 2020-08-26 ENCOUNTER — Telehealth: Payer: Self-pay | Admitting: Internal Medicine

## 2020-08-26 DIAGNOSIS — I10 Essential (primary) hypertension: Secondary | ICD-10-CM

## 2020-08-26 MED ORDER — INDAPAMIDE 2.5 MG PO TABS: 2.5000 mg | ORAL_TABLET | Freq: Every day | ORAL | 0 refills | Status: DC

## 2020-08-26 NOTE — Telephone Encounter (Signed)
° °  Patient/spouse calling to report carvedilol (COREG) 6.25 MG tablet and indapamide (LOZOL) 1.25 MG tablet are not controlling BP.  No symptoms  101/98 171/46   Seeking advice

## 2020-08-26 NOTE — Telephone Encounter (Signed)
Increase indapamide to 2.5 mg each day

## 2020-08-27 NOTE — Telephone Encounter (Signed)
Pt has been informed and expressed understanding.  

## 2020-09-03 ENCOUNTER — Other Ambulatory Visit: Payer: Self-pay | Admitting: Internal Medicine

## 2020-09-03 ENCOUNTER — Other Ambulatory Visit: Payer: Self-pay

## 2020-09-03 DIAGNOSIS — I10 Essential (primary) hypertension: Secondary | ICD-10-CM

## 2020-09-03 MED ORDER — INDAPAMIDE 2.5 MG PO TABS: 2.5000 mg | ORAL_TABLET | Freq: Every day | ORAL | 0 refills | Status: DC

## 2020-09-03 MED ORDER — CARVEDILOL 6.25 MG PO TABS: 6.2500 mg | ORAL_TABLET | Freq: Two times a day (BID) | ORAL | 1 refills | Status: DC

## 2020-09-04 ENCOUNTER — Ambulatory Visit (INDEPENDENT_AMBULATORY_CARE_PROVIDER_SITE_OTHER): Payer: Medicare PPO | Admitting: Internal Medicine

## 2020-09-04 ENCOUNTER — Encounter: Payer: Self-pay | Admitting: Internal Medicine

## 2020-09-04 DIAGNOSIS — I10 Essential (primary) hypertension: Secondary | ICD-10-CM | POA: Diagnosis not present

## 2020-09-04 NOTE — Patient Instructions (Signed)
We will keep the medicine the same for 1-2 weeks and we will get back in touch with you about the medicine.

## 2020-09-04 NOTE — Assessment & Plan Note (Signed)
Will ask PCP if he can switch to valsartan/hctz instead of carvedilol and indapamide. He would like to get off beta blocker. I do not see specific indication for this. He would prefer combination pill if possible rather than 2 separate medications.

## 2020-09-04 NOTE — Progress Notes (Signed)
   Subjective:   Patient ID: Douglas Carpenter, male    DOB: 08-Aug-1955, 65 y.o.   MRN: [id number]  HPI The patient is a 65 YO man coming in for concerns about his BP. He had established about 1-2 months ago and BP medication was changed to carvedilol and indapamide. He has been taking these but BP still elevated more the diastolic. Checks several times a day and today's reading is consistent with at home. He called in and indapamide was increased about 1 week ago and he did immediately make that change. He is hoping to change regimen as his urologist states that his ED/BPH is likely impacted by the beta blocker. He does not have indication for beta blocker and does have concurrent asthma. No recent asthma flare up. States the [place] originally started him on atenolol for BP.   Review of Systems  Constitutional: Negative.   HENT: Negative.   Eyes: Negative.   Respiratory: Negative for cough, chest tightness and shortness of breath.   Cardiovascular: Negative for chest pain, palpitations and leg swelling.  Gastrointestinal: Negative for abdominal distention, abdominal pain, constipation, diarrhea, nausea and vomiting.  Musculoskeletal: Negative.   Skin: Negative.   Neurological: Negative.   Psychiatric/Behavioral: Negative.     Objective:  Physical Exam Constitutional:      Appearance: He is well-developed.  HENT:     Head: Normocephalic and atraumatic.  Cardiovascular:     Rate and Rhythm: Normal rate and regular rhythm.  Pulmonary:     Effort: Pulmonary effort is normal. No respiratory distress.     Breath sounds: Normal breath sounds. No wheezing or rales.  Abdominal:     General: Bowel sounds are normal. There is no distension.     Palpations: Abdomen is soft.     Tenderness: There is no abdominal tenderness. There is no rebound.  Musculoskeletal:     Cervical back: Normal range of motion.  Skin:    General: Skin is warm and dry.  Neurological:     Mental Status: He is alert and  oriented to person, place, and time.     Coordination: Coordination normal.     Vitals:   09/04/20 1041  BP: (!) 136/94  Pulse: 100  Resp: 18  Temp: 98.4 F (36.9 C)  TempSrc: Oral  SpO2: 93%  Weight: 236 lb (107 kg)  Height: 5\' 7"  (1.702 m)    This visit occurred during the SARS-CoV-2 public health emergency.  Safety protocols were in place, including screening questions prior to the visit, additional usage of staff PPE, and extensive cleaning of exam room while observing appropriate contact time as indicated for disinfecting solutions.   Assessment & Plan:  Visit time 20 minutes in face to face communication with patient and coordination of care, additional 10 minutes spent in record review, coordination or care, ordering tests, communicating/referring to other healthcare professionals, documenting in medical records all on the same day of the visit for total time 30 minutes spent on the visit.

## 2020-09-09 ENCOUNTER — Telehealth: Payer: Self-pay | Admitting: Internal Medicine

## 2020-09-09 NOTE — Telephone Encounter (Signed)
LVM for pt to rtn my call to schedule AWV with [facility]. Please schedule appt if pt calls the office.  

## 2020-09-22 ENCOUNTER — Ambulatory Visit (INDEPENDENT_AMBULATORY_CARE_PROVIDER_SITE_OTHER): Payer: Medicare PPO | Admitting: Orthopaedic Surgery

## 2020-09-22 ENCOUNTER — Encounter: Payer: Self-pay | Admitting: Orthopaedic Surgery

## 2020-09-22 ENCOUNTER — Ambulatory Visit (INDEPENDENT_AMBULATORY_CARE_PROVIDER_SITE_OTHER): Payer: Medicare PPO

## 2020-09-22 ENCOUNTER — Other Ambulatory Visit: Payer: Self-pay

## 2020-09-22 VITALS — Ht 67.0 in | Wt 236.0 lb

## 2020-09-22 DIAGNOSIS — M1811 Unilateral primary osteoarthritis of first carpometacarpal joint, right hand: Secondary | ICD-10-CM

## 2020-09-22 DIAGNOSIS — M79641 Pain in right hand: Secondary | ICD-10-CM | POA: Diagnosis not present

## 2020-09-22 MED ORDER — METHYLPREDNISOLONE ACETATE 40 MG/ML IJ SUSP
13.3300 mg | INTRAMUSCULAR | Status: AC | PRN
  Administered 2020-09-22: 13.33 mg

## 2020-09-22 MED ORDER — LIDOCAINE HCL 1 % IJ SOLN
0.3000 mL | INTRAMUSCULAR | Status: AC | PRN
  Administered 2020-09-22: .3 mL

## 2020-09-22 MED ORDER — BUPIVACAINE HCL 0.5 % IJ SOLN
0.3300 mL | INTRAMUSCULAR | Status: AC | PRN
Start: 2020-09-22 — End: 2020-09-22
  Administered 2020-09-22: .33 mL

## 2020-09-22 MED ORDER — DICLOFENAC SODIUM 1 % EX GEL: 2.0000 g | Freq: Four times a day (QID) | CUTANEOUS | 0 refills | Status: DC

## 2020-09-22 NOTE — Progress Notes (Signed)
Office Visit Note   Patient: Douglas Carpenter           Date of Birth: 04/10/1956           MRN: [id number] Visit Date: 09/22/2020              Requested by: [name], MD [address] [place],  [place] [address] PCP: [name], MD   Assessment & Plan: Visit Diagnoses:  1. Primary osteoarthritis of first carpometacarpal joint of right hand     Plan: Impression is right thumb CMC osteoarthritis.  We have discussed cortisone injection for which he would like to proceed.  We have also provided him with a removable thumb spica splint to wear as needed.  He will follow up with [place] as needed.  Follow-Up Instructions: Return if symptoms worsen or fail to improve.   Orders:  Orders Placed This Encounter  Procedures  . Hand/UE Inj  . XR Hand Complete Right   Meds ordered this encounter  Medications  . diclofenac Sodium (VOLTAREN) 1 % GEL    Sig: Apply 2 g topically 4 (four) times daily.    Dispense:  150 g    Refill:  0      Procedures: Hand/UE Inj: R thumb CMC for osteoarthritis on 09/22/2020 12:59 PM Indications: pain Details: 25 G needle Medications: 0.3 mL lidocaine 1 %; 0.33 mL bupivacaine 0.5 %; 13.33 mg methylPREDNISolone acetate 40 MG/ML Outcome: tolerated well, no immediate complications      Clinical Data: No additional findings.   Subjective: Chief Complaint  Patient presents with  . Right Hand - Pain    HPI patient is a pleasant 65 year old gentleman who comes in today with right thumb pain for the past 3 months which has progressively worsened.  No specific injury or change in activity.  All of his pain is is of the thumb and is worse with grabbing or holding onto things.  He has not taken any medication for this.  Review of Systems as detailed in HPI.  All others reviewed and are negative.   Objective: Vital Signs: Ht 5\' 7"  (1.702 m)   Wt 236 lb (107 kg)   BMI 36.96 kg/m   Physical Exam well-developed well-nourished gentleman in no  acute distress.  Alert and oriented x3.  Ortho Exam right thumb exam shows moderate tenderness to the base of the thumb.  Positive grind test.  Has increased pain with opposition to the small finger and thumb extension.  He is neurovascularly intact distally.  Specialty Comments:  No specialty comments available.  Imaging: XR Hand Complete Right  Result Date: 09/22/2020 Degenerative changes to the first [facility] joint    PMFS History: Patient Active Problem List   Diagnosis Date Noted  . Encounter for general adult medical examination with abnormal findings 08/05/2020  . Rising PSA level 08/04/2020  . Stage 3a chronic kidney disease ([place]) 08/04/2020  . Chronic hyperglycemia 08/03/2020  . Primary hypertension 08/03/2020  . Benign prostatic hyperplasia without lower urinary tract symptoms 08/03/2020  . Hyperlipidemia with target LDL less than 130 08/03/2020  . Acute COVID-19 08/03/2020  . Cough 08/03/2020  . Primary osteoarthritis of left knee 04/16/2019  . Primary osteoarthritis of right knee 02/20/2019  . Exercise-induced asthma 07/21/2014  . Chronic cough 07/21/2014  . Upper airway cough syndrome 07/21/2014  . Allergic rhinitis 07/21/2014  . Lumbar stenosis 11/06/2013  . Urethral stricture 01/07/2013  . VOCAL CORD DISORDER 02/19/2009  . Asthma 01/27/2009  .  GERD 01/27/2009   Past Medical History:  Diagnosis Date  . Allergy   . Arthritis    "everywhere"  . BPH (benign prostatic hypertrophy)   . Chronic rhinitis 01/27/2009  . ED (erectile dysfunction)   . GERD (gastroesophageal reflux disease)   . Headache(784.0)    migraines, as many as 3 in a week    . HYPERLIPIDEMIA 01/27/2009  . HYPERTENSION 01/27/2009  . Mild asthma    seasonal allergies, uses Proair once per yr.   . Mild obstructive sleep apnea    per pt -- mild osa test result--  no rx cpap needed  . Neuromuscular disorder ([place])   . Sleep apnea    no cpap  . Testosterone deficiency   . Urethral stricture   .  Vocal cord anomaly    pt states told from [place]  doctor heard a little gurgle sound -- no farther testing done    Family History  Problem Relation Age of Onset  . Cancer Mother        lung  . Lung cancer Mother   . Heart disease Maternal Grandmother   . Heart disease Maternal Grandfather   . Cancer Maternal Grandfather        colon, prostate,lung  . Colon cancer Neg Hx   . Colon polyps Neg Hx   . Esophageal cancer Neg Hx   . Rectal cancer Neg Hx   . Stomach cancer Neg Hx     Past Surgical History:  Procedure Laterality Date  . BACK SURGERY    . COLONOSCOPY    . CYSTOSCOPY WITH URETHRAL DILATATION N/A 01/07/2013   Procedure: CYSTOSCOPY WITH URETHRAL DILATATION BALLOON DILATION ;  Surgeon: Bernestine Amass, MD;  Location: [facility];  Service: Urology;  Laterality: N/A;  . LUMBAR LAMINECTOMY/DECOMPRESSION MICRODISCECTOMY Right 11/29/2012   Procedure: Right Lumbar Four to Five Laminectomy/ Decompression Microdiskectomy;  Surgeon: Otilio Connors, MD;  Location: [facility];  Service: Neurosurgery;  Laterality: Right;  LUMBAR LAMINECTOMY/DECOMPRESSION MICRODISCECTOMY 1 LEVEL  . POLYPECTOMY    . SHOULDER ARTHROSCOPY W/ ACROMIAL REPAIR Left 08-28-2002  . TOTAL KNEE ARTHROPLASTY Left 12/02/2019  . TOTAL KNEE ARTHROPLASTY Left 12/02/2019   Procedure: LEFT TOTAL KNEE ARTHROPLASTY;  Surgeon: Leandrew Koyanagi, MD;  Location: [facility];  Service: Orthopedics;  Laterality: Left;  . UPPER GASTROINTESTINAL ENDOSCOPY     Social History   Occupational History  . Occupation: retired  Tobacco Use  . Smoking status: Former Smoker    Packs/day: 0.50    Years: 22.00    Pack years: 11.00    Types: Cigarettes    Quit date: 06/27/1988    Years since quitting: 32.2  . Smokeless tobacco: Never Used  Vaping Use  . Vaping Use: Never used  Substance and Sexual Activity  . Alcohol use: Yes    Alcohol/week: 21.0 standard drinks    Types: 21 Shots of liquor per week    Comment: cognac  . Drug use:  No  . Sexual activity: Yes    Partners: Female

## 2020-09-25 ENCOUNTER — Ambulatory Visit (INDEPENDENT_AMBULATORY_CARE_PROVIDER_SITE_OTHER): Payer: Medicare PPO | Admitting: Internal Medicine

## 2020-09-25 ENCOUNTER — Encounter: Payer: Self-pay | Admitting: Internal Medicine

## 2020-09-25 ENCOUNTER — Other Ambulatory Visit: Payer: Self-pay

## 2020-09-25 VITALS — BP 160/80 | HR 90 | Temp 98.7°F | Ht 67.0 in | Wt 233.4 lb

## 2020-09-25 DIAGNOSIS — I10 Essential (primary) hypertension: Secondary | ICD-10-CM | POA: Diagnosis not present

## 2020-09-25 DIAGNOSIS — R0602 Shortness of breath: Secondary | ICD-10-CM

## 2020-09-25 MED ORDER — VALSARTAN-HYDROCHLOROTHIAZIDE 160-25 MG PO TABS: 1.0000 | ORAL_TABLET | Freq: Every day | ORAL | 3 refills | Status: DC

## 2020-09-25 NOTE — Progress Notes (Signed)
   Subjective:   Patient ID: Douglas Carpenter, male    DOB: 1955/10/15, 65 y.o.   MRN: [id number]  HPI The patient is a 65 YO man coming in for concerns about blood pressure and heart rate. Heart rate has been elevated in the last several weeks. Still taking coreg and indapamide. BP has leveled out but still slightly elevated. [facility] and [facility]. We had talked previously about doing single combination pill and he wishes to proceed with that. He does not experience any palpitations when HR is around 100 and does not seem to change much with exercise.   Review of Systems  Constitutional: Negative.   HENT: Negative.   Eyes: Negative.   Respiratory: Negative for cough, chest tightness and shortness of breath.   Cardiovascular: Negative for chest pain, palpitations and leg swelling.  Gastrointestinal: Negative for abdominal distention, abdominal pain, constipation, diarrhea, nausea and vomiting.  Musculoskeletal: Negative.   Skin: Negative.   Neurological: Negative.   Psychiatric/Behavioral: Negative.     Objective:  Physical Exam Constitutional:      Appearance: He is well-developed.  HENT:     Head: Normocephalic and atraumatic.  Cardiovascular:     Rate and Rhythm: Normal rate and regular rhythm.  Pulmonary:     Effort: Pulmonary effort is normal. No respiratory distress.     Breath sounds: Normal breath sounds. No wheezing or rales.  Abdominal:     General: Bowel sounds are normal. There is no distension.     Palpations: Abdomen is soft.     Tenderness: There is no abdominal tenderness. There is no rebound.  Musculoskeletal:     Cervical back: Normal range of motion.  Skin:    General: Skin is warm and dry.  Neurological:     Mental Status: He is alert and oriented to person, place, and time.     Coordination: Coordination normal.     Vitals:   09/25/20 0912  BP: (!) 160/80  Pulse: 90  Temp: 98.7 F (37.1 C)  TempSrc: Oral  SpO2: 96%   Weight: 233 lb 6.4 oz (105.9 kg)  Height: 5\' 7"  (1.702 m)   EKG: Rate 84, axis normal, borderline long QTc 465, sinus, no st or t wave changes, no significant change compared to Feb 2022 however QTc has increased from 400 to 460  This visit occurred during the SARS-CoV-2 public health emergency.  Safety protocols were in place, including screening questions prior to the visit, additional usage of staff PPE, and extensive cleaning of exam room while observing appropriate contact time as indicated for disinfecting solutions.   Assessment & Plan:  Visit time 20 minutes in face to face communication with patient and coordination of care, additional 10 minutes spent in record review, coordination or care, ordering tests, communicating/referring to other healthcare professionals, documenting in medical records all on the same day of the visit for total time 30 minutes spent on the visit.

## 2020-09-25 NOTE — Assessment & Plan Note (Signed)
Will stop coreg and indapamide. Start valsartan/hctz and recommend follow up visit 4 weeks. Suspect elevated HR is due to covid-19 infection. EKG done today to verify sinus and not A fib.

## 2020-09-25 NOTE — Patient Instructions (Addendum)
We will have you stop taking the coreg (carvedilol) and indapamide.   We have sent in valsartan/hctz to take 1 pill daily for blood pressure.

## 2020-10-22 ENCOUNTER — Other Ambulatory Visit: Payer: Self-pay | Admitting: Pulmonary Disease

## 2020-10-22 ENCOUNTER — Other Ambulatory Visit: Payer: Self-pay

## 2020-10-22 ENCOUNTER — Ambulatory Visit (INDEPENDENT_AMBULATORY_CARE_PROVIDER_SITE_OTHER): Payer: Medicare PPO | Admitting: Internal Medicine

## 2020-10-22 ENCOUNTER — Encounter: Payer: Self-pay | Admitting: Internal Medicine

## 2020-10-22 VITALS — BP 134/84 | HR 91 | Temp 98.6°F | Resp 16 | Ht 67.0 in | Wt 222.0 lb

## 2020-10-22 DIAGNOSIS — E876 Hypokalemia: Secondary | ICD-10-CM | POA: Diagnosis not present

## 2020-10-22 DIAGNOSIS — T502X5A Adverse effect of carbonic-anhydrase inhibitors, benzothiadiazides and other diuretics, initial encounter: Secondary | ICD-10-CM | POA: Diagnosis not present

## 2020-10-22 DIAGNOSIS — I10 Essential (primary) hypertension: Secondary | ICD-10-CM | POA: Diagnosis not present

## 2020-10-22 LAB — BASIC METABOLIC PANEL
BUN: 25 mg/dL — ABNORMAL HIGH (ref 6–23)
CO2: 32 mEq/L (ref 19–32)
Calcium: 9.6 mg/dL (ref 8.4–10.5)
Chloride: 96 mEq/L (ref 96–112)
Creatinine, Ser: 1.17 mg/dL (ref 0.40–1.50)
GFR: 65.47 mL/min (ref >60.00)
Glucose, Bld: 110 mg/dL — ABNORMAL HIGH (ref 70–99)
Potassium: 3.3 mEq/L — ABNORMAL LOW (ref 3.5–5.1)
Sodium: 138 mEq/L (ref 135–145)

## 2020-10-22 MED ORDER — POTASSIUM CHLORIDE CRYS ER 20 MEQ PO TBCR: 20.0000 meq | EXTENDED_RELEASE_TABLET | Freq: Two times a day (BID) | ORAL | 1 refills | Status: DC

## 2020-10-22 NOTE — Patient Instructions (Signed)

## 2020-10-22 NOTE — Progress Notes (Signed)
Subjective:  Patient ID: Douglas Carpenter, male    DOB: [birth year]  Age: 65 y.o. MRN: [id number]  CC: Hypertension  This visit occurred during the SARS-CoV-2 public health emergency.  Safety protocols were in place, including screening questions prior to the visit, additional usage of staff PPE, and extensive cleaning of exam room while observing appropriate contact time as indicated for disinfecting solutions.    HPI Douglas Carpenter presents for f/up - He has felt well recently and offers no complaints.  He has been working on his lifestyle modifications and has been able to lose weight.  He is active and denies any recent episodes of chest pain, shortness of breath, dizziness, lightheadedness, diaphoresis, edema, or fatigue.  Outpatient Medications Prior to Visit  Medication Sig Dispense Refill  . albuterol (PROAIR HFA) 108 (90 Base) MCG/ACT inhaler Inhale 2 puffs into the lungs every 6 (six) hours as needed for wheezing or shortness of breath. 1 Inhaler 3  . amoxicillin (AMOXIL) 500 MG capsule Take 4 pills one hour prior to dental work 12 capsule 0  . aspirin EC 81 MG tablet Take 1 tablet (81 mg total) by mouth 2 (two) times daily. 84 tablet 0  . atorvastatin (LIPITOR) 80 MG tablet Take 1 tablet (80 mg total) by mouth at bedtime. 90 tablet 1  . diclofenac Sodium (VOLTAREN) 1 % GEL Apply 2 g topically 4 (four) times daily. 150 g 0  . DULoxetine (CYMBALTA) 60 MG capsule Take 1 capsule (60 mg total) by mouth daily. 90 capsule 1  . Erenumab-aooe (AIMOVIG) 70 MG/ML SOAJ Inject 70 mg into the skin every 30 (thirty) days.    . fluticasone-salmeterol (ADVAIR HFA) 230-21 MCG/ACT inhaler Inhale 2 puffs into the lungs 2 (two) times daily. 1 each 11  . gabapentin (NEURONTIN) 600 MG tablet Take 600 mg by mouth 3 (three) times daily.     . montelukast (SINGULAIR) 10 MG tablet Take 1 tablet (10 mg total) by mouth at bedtime. 30 tablet 11  . olopatadine (PATADAY) 0.1 % ophthalmic solution Place 1 drop into  both eyes 2 (two) times daily. 5 mL 0  . omeprazole (PRILOSEC) 40 MG capsule Take 40 mg by mouth daily.     . valsartan-hydrochlorothiazide (DIOVAN-HCT) 160-25 MG tablet Take 1 tablet by mouth daily. 90 tablet 3   No facility-administered medications prior to visit.    ROS Review of Systems  Constitutional: Negative.  Negative for diaphoresis, fatigue and unexpected weight change.  HENT: Negative.   Eyes: Negative.   Respiratory: Negative for cough, chest tightness, shortness of breath and wheezing.   Cardiovascular: Negative for chest pain, palpitations and leg swelling.  Gastrointestinal: Negative for abdominal pain, diarrhea, nausea and vomiting.  Endocrine: Negative.   Genitourinary: Negative.  Negative for difficulty urinating and dysuria.  Musculoskeletal: Negative for arthralgias.  Skin: Negative.  Negative for color change and pallor.  Neurological: Negative.  Negative for dizziness, weakness, light-headedness and headaches.  Hematological: Negative for adenopathy. Does not bruise/bleed easily.  Psychiatric/Behavioral: Negative.     Objective:  BP 134/84 (BP Location: Right Arm, Patient Position: Sitting, Cuff Size: Large)   Pulse 91   Temp 98.6 F (37 C) (Oral)   Resp 16   Ht 5\' 7"  (1.702 m)   Wt 222 lb (100.7 kg)   SpO2 97%   BMI 34.77 kg/m   BP Readings from Last 3 Encounters:  10/22/20 134/84  09/25/20 (!) 160/80  09/04/20 (!) 136/94    Wt Readings from  Last 3 Encounters:  10/22/20 222 lb (100.7 kg)  09/25/20 233 lb 6.4 oz (105.9 kg)  09/22/20 236 lb (107 kg)    Physical Exam Vitals reviewed.  HENT:     Nose: Nose normal.     Mouth/Throat:     Mouth: Mucous membranes are moist.  Eyes:     Conjunctiva/sclera: Conjunctivae normal.  Cardiovascular:     Rate and Rhythm: Normal rate and regular rhythm.     Heart sounds: No murmur heard.   Pulmonary:     Effort: Pulmonary effort is normal.     Breath sounds: No stridor. No wheezing, rhonchi or  rales.  Abdominal:     General: Abdomen is flat. Bowel sounds are normal. There is no distension.     Palpations: Abdomen is soft. There is no hepatomegaly, splenomegaly or mass.     Tenderness: There is no abdominal tenderness.  Musculoskeletal:        General: Normal range of motion.     Cervical back: Neck supple.     Right lower leg: No edema.     Left lower leg: No edema.  Lymphadenopathy:     Cervical: No cervical adenopathy.  Skin:    General: Skin is warm and dry.     Coloration: Skin is not pale.  Neurological:     General: No focal deficit present.  Psychiatric:        Mood and Affect: Mood normal.        Behavior: Behavior normal.     Lab Results  Component Value Date   WBC 5.9 08/03/2020   HGB 14.0 08/03/2020   HCT 42.0 08/03/2020   PLT 202.0 08/03/2020   GLUCOSE 110 (H) 10/22/2020   CHOL 218 (H) 08/03/2020   TRIG 149.0 08/03/2020   HDL 64.00 08/03/2020   LDLCALC 124 (H) 08/03/2020   ALT 26 08/03/2020   AST 21 08/03/2020   NA 138 10/22/2020   K 3.3 (L) 10/22/2020   CL 96 10/22/2020   CREATININE 1.17 10/22/2020   BUN 25 (H) 10/22/2020   CO2 32 10/22/2020   TSH 1.69 08/03/2020   PSA 3.95 08/03/2020   INR 1.0 11/28/2019   HGBA1C 6.3 08/03/2020    DG Chest 2 View  Result Date: 11/28/2019 CLINICAL DATA:  Preop left knee replacement. EXAM: CHEST - 2 VIEW COMPARISON:  Radiograph 08/08/2014 FINDINGS: The cardiomediastinal contours are normal. Incidental azygos fissure again seen. Subsegmental atelectasis or scarring in the lingula. Pulmonary vasculature is normal. No consolidation, pleural effusion, or pneumothorax. No acute osseous abnormalities are seen. IMPRESSION: Subsegmental atelectasis or scarring in the lingula. Otherwise negative radiographs of the chest. Electronically Signed   By: Keith Rake M.D.   On: 11/28/2019 15:20    Assessment & Plan:   Douglas Carpenter was seen today for hypertension.  Diagnoses and all orders for this visit:  Primary  hypertension- His blood pressure is adequately well controlled but he has developed hypokalemia.  I recommended that he start taking a potassium supplement. -     Basic metabolic panel; Future -     Basic metabolic panel -     potassium chloride SA (KLOR-CON) 20 MEQ tablet; Take 1 tablet (20 mEq total) by mouth 2 (two) times daily.  Diuretic-induced hypokalemia -     potassium chloride SA (KLOR-CON) 20 MEQ tablet; Take 1 tablet (20 mEq total) by mouth 2 (two) times daily.   I am having [name] start on potassium chloride SA. I am also  having him maintain his DULoxetine, albuterol, olopatadine, gabapentin, omeprazole, Aimovig, aspirin EC, Advair HFA, montelukast, atorvastatin, amoxicillin, diclofenac Sodium, and valsartan-hydrochlorothiazide.  Meds ordered this encounter  Medications  . potassium chloride SA (KLOR-CON) 20 MEQ tablet    Sig: Take 1 tablet (20 mEq total) by mouth 2 (two) times daily.    Dispense:  180 tablet    Refill:  1     Follow-up: Return in about 6 months (around 04/23/2021).  Scarlette Calico, MD

## 2020-10-26 ENCOUNTER — Other Ambulatory Visit: Payer: Self-pay | Admitting: Internal Medicine

## 2020-10-29 ENCOUNTER — Encounter: Admitting: Internal Medicine

## 2020-10-29 ENCOUNTER — Encounter: Payer: Self-pay | Admitting: Internal Medicine

## 2020-10-29 ENCOUNTER — Telehealth (INDEPENDENT_AMBULATORY_CARE_PROVIDER_SITE_OTHER): Payer: Medicare PPO | Admitting: Family Medicine

## 2020-10-29 DIAGNOSIS — J45909 Unspecified asthma, uncomplicated: Secondary | ICD-10-CM | POA: Diagnosis not present

## 2020-10-29 DIAGNOSIS — R059 Cough, unspecified: Secondary | ICD-10-CM | POA: Diagnosis not present

## 2020-10-29 MED ORDER — BENZONATATE 100 MG PO CAPS: 100.0000 mg | ORAL_CAPSULE | Freq: Three times a day (TID) | ORAL | 0 refills | Status: DC | PRN

## 2020-10-29 MED ORDER — PREDNISONE 20 MG PO TABS: 40.0000 mg | ORAL_TABLET | Freq: Every day | ORAL | 0 refills | Status: DC

## 2020-10-29 NOTE — Progress Notes (Signed)
Virtual Visit via Video Note  I connected with Douglas Carpenter on 10/29/20 at  8:50 AM EDT by a video enabled telemedicine application and verified that I am speaking with the correct person using two identifiers.   I discussed the limitations of evaluation and management by telemedicine and the availability of in person appointments. The patient expressed understanding and agreed to proceed.  Present for the visit:  Myself, Dr Billey Gosling, Kendell Bane.  The patient is currently at home and I am in the office.    No referring provider.    History of Present Illness: He is here for an acute visit for cold symptoms.  His symptoms started  He is experiencing   He has tried taking    ROS    Social History   Socioeconomic History  . Marital status: Married    Spouse name: Not on file  . Number of children: Not on file  . Years of education: Not on file  . Highest education level: Not on file  Occupational History  . Occupation: retired  Tobacco Use  . Smoking status: Former Smoker    Packs/day: 0.50    Years: 22.00    Pack years: 11.00    Types: Cigarettes    Quit date: 06/27/1988    Years since quitting: 32.3  . Smokeless tobacco: Never Used  Vaping Use  . Vaping Use: Never used  Substance and Sexual Activity  . Alcohol use: Yes    Alcohol/week: 21.0 standard drinks    Types: 21 Shots of liquor per week    Comment: cognac  . Drug use: No  . Sexual activity: Yes    Partners: Female  Other Topics Concern  . Not on file  Social History Narrative  . Not on file   Social Determinants of Health   Financial Resource Strain: Not on file  Food Insecurity: Not on file  Transportation Needs: Not on file  Physical Activity: Not on file  Stress: Not on file  Social Connections: Not on file     Observations/Objective: Appears well in NAD   Assessment and Plan:  See Problem List for Assessment and Plan of chronic medical problems.   Follow Up Instructions:     I discussed the assessment and treatment plan with the patient. The patient was provided an opportunity to ask questions and all were answered. The patient agreed with the plan and demonstrated an understanding of the instructions.   The patient was advised to call back or seek an in-person evaluation if the symptoms worsen or if the condition fails to improve as anticipated.    Binnie Rail, MD  This encounter was created in error - please disregard.

## 2020-10-29 NOTE — Progress Notes (Signed)
Virtual Visit via Telephone Note  I connected with Douglas Carpenter on 10/29/20 at  1:00 PM EDT by telephone and verified that I am speaking with the correct person using two identifiers.   I discussed the limitations, risks, security and privacy concerns of performing an evaluation and management service by telephone and the availability of in person appointments. I also discussed with the patient that there may be a patient responsible charge related to this service. The patient expressed understanding and agreed to proceed.  Location patient: home, [place] Location provider: work or home office Participants present for the call: patient, provider, wife Patient did not have a visit with me in the prior 7 days to address this/these issue(s).   History of Present Illness:  Acute telemedicine visit for congestion and cough: -Onset: 5/1 -did covid test at [facility] which was negative -Symptoms include: nasal congestion, cough, sore throat, using neb and alb more frequently - wheezy, a lot of thick nasal mucus  -Denies: CP, SOB, NVD, inability to eat/drink/get out of bed -Pertinent past medical history: asthma - takes Advair, has albuterol to use if needed -Pertinent medication allergies:  Allergies  Allergen Reactions  . Aspirin Rash    Pt can tolerate low doses-- INTOLERANT TO HIGHER DOSES   -COVID-19 vaccine status: had covid vaccines + booster; had flu shot   Observations/Objective: Patient sounds cheerful and well on the phone. I do not appreciate any SOB. Speech and thought processing are grossly intact. Patient reported vitals:  Assessment and Plan:  Cough  Asthma, unspecified asthma severity, unspecified whether complicated, unspecified whether persistent  -we discussed possible serious and likely etiologies, options for evaluation and workup, limitations of telemedicine visit vs in person visit, treatment, treatment risks and precautions. Pt prefers to treat via telemedicine  empirically rather than in person at this moment.  Query viral upper respiratory illness, influenza, COVID with false negative testing versus other.  He agrees to do a second COVID test, though he did have a negative COVID test and is vaccinated.  Opted against Tamiflu.  He did opt for prednisone, and Tessalon for cough if needed.  Discussed an antibiotic as well, he opted to hold off on this for now.  He agrees to schedule follow-up video visit with his primary care office or seek in person care if he is worsening, not improving or if he has a positive COVID test. Scheduled follow up with PCP offered: Prefers to follow-up as needed. Advised to seek prompt in person care if worsening, new symptoms arise, or if is not improving with treatment. Advised of options for inperson care in case PCP office not available. Did let the patient know that I only do telemedicine shifts for [facility] on Tuesdays and Thursdays and advised a follow up visit with PCP or at an [facility] if has further questions or concerns.   Follow Up Instructions:  I did not refer this patient for an OV with me in the next 24 hours for this/these issue(s).  I discussed the assessment and treatment plan with the patient. The patient was provided an opportunity to ask questions and all were answered. The patient agreed with the plan and demonstrated an understanding of the instructions.   I spent 18 minutes on the date of this visit in the care of this patient. See summary of tasks completed to properly care for this patient in the detailed notes above which also included counseling of above, review of PMH, medications, allergies, evaluation of the patient  and ordering and/or  instructing patient on testing and care options.     Lucretia Kern, DO

## 2020-10-29 NOTE — Patient Instructions (Signed)
-  I sent the medication(s) we discussed to your pharmacy: Meds ordered this encounter  Medications  . predniSONE (DELTASONE) 20 MG tablet    Sig: Take 2 tablets (40 mg total) by mouth daily with breakfast.    Dispense:  8 tablet    Refill:  0  . benzonatate (TESSALON PERLES) 100 MG capsule    Sig: Take 1 capsule (100 mg total) by mouth 3 (three) times daily as needed.    Dispense:  20 capsule    Refill:  0   Repeat a COVID test.  Schedule follow-up video visit with your primary care office or [facility] if the test is positive.  I hope you are feeling better soon!  Seek in person care promptly if your symptoms worsen, new concerns arise or you are not improving with treatment.  It was nice to meet you today. I help [facility] out with telemedicine visits on Tuesdays and Thursdays and am available for visits on those days. If you have any concerns or questions following this visit please schedule a follow up visit with your Primary Care doctor or seek care at a local urgent care clinic to avoid delays in care.

## 2020-11-02 ENCOUNTER — Other Ambulatory Visit (INDEPENDENT_AMBULATORY_CARE_PROVIDER_SITE_OTHER): Payer: Medicare PPO

## 2020-11-02 ENCOUNTER — Other Ambulatory Visit: Payer: Self-pay | Admitting: Internal Medicine

## 2020-11-02 ENCOUNTER — Ambulatory Visit: Payer: Medicare PPO | Admitting: Internal Medicine

## 2020-11-02 DIAGNOSIS — R972 Elevated prostate specific antigen [PSA]: Secondary | ICD-10-CM

## 2020-11-02 LAB — PSA: PSA: 2.65 ng/mL (ref 0.10–4.00)

## 2020-11-09 NOTE — Progress Notes (Signed)
Subjective:    Patient ID: Douglas Carpenter, male    DOB: January 17, 1956, 65 y.o.   MRN: [id number]  HPI The patient is here for an acute visit.  VV 5/5 with Dr Maudie Mercury - symptoms started 5/1.  [facility]  Had congestion, cough, ST, wheeze.  He was prescribed at that time prednisone, tessalon.  Advised repeat covid.    He has minimal amount of mucus in his throat, but otherwise the cold symptoms have resolved.  He had COVID back in January of this year.  He states he has had increased fatigue since that time.  In the past month he states he has had 0 energy and the fatigue is gotten much worse.  He states dyspnea with exertion.  This is also gotten worse, especially in the past month.  He has decreased stamina.  He does follow at the [place] and stated that the [place] called him after his visit and wanted him to have a Holter monitor.  He is unsure why.  He was concerned about this.  Him and his wife do want him to see cardiology.   Reviewed last 2 EKGs from this year.  Reviewed Dr. Nathanial Millman note from 09/25/2020, Dr. Ronnald Ramp notes from 10/22/2020 and Dr. Julianne Rice note from 10/29/2020.    Medications and allergies reviewed with patient and updated if appropriate.  Patient Active Problem List   Diagnosis Date Noted  . Diuretic-induced hypokalemia 10/22/2020  . Encounter for general adult medical examination with abnormal findings 08/05/2020  . Rising PSA level 08/04/2020  . Stage 3a chronic kidney disease ([place]) 08/04/2020  . Chronic hyperglycemia 08/03/2020  . Primary hypertension 08/03/2020  . Benign prostatic hyperplasia without lower urinary tract symptoms 08/03/2020  . Hyperlipidemia with target LDL less than 130 08/03/2020  . Primary osteoarthritis of left knee 04/16/2019  . Primary osteoarthritis of right knee 02/20/2019  . Exercise-induced asthma 07/21/2014  . Chronic cough 07/21/2014  . Upper airway cough syndrome 07/21/2014  . Allergic rhinitis 07/21/2014  . Lumbar stenosis  11/06/2013  . Urethral stricture 01/07/2013  . VOCAL CORD DISORDER 02/19/2009  . Asthma 01/27/2009  . GERD 01/27/2009    Current Outpatient Medications on File Prior to Visit  Medication Sig Dispense Refill  . albuterol (PROAIR HFA) 108 (90 Base) MCG/ACT inhaler Inhale 2 puffs into the lungs every 6 (six) hours as needed for wheezing or shortness of breath. 1 Inhaler 3  . aspirin EC 81 MG tablet Take 1 tablet (81 mg total) by mouth 2 (two) times daily. 84 tablet 0  . atorvastatin (LIPITOR) 80 MG tablet Take 1 tablet (80 mg total) by mouth at bedtime. 90 tablet 1  . diclofenac Sodium (VOLTAREN) 1 % GEL Apply 2 g topically 4 (four) times daily. 150 g 0  . DULoxetine (CYMBALTA) 60 MG capsule Take 1 capsule (60 mg total) by mouth daily. 90 capsule 1  . Erenumab-aooe (AIMOVIG) 70 MG/ML SOAJ Inject 70 mg into the skin every 30 (thirty) days.    . fluticasone-salmeterol (ADVAIR HFA) 230-21 MCG/ACT inhaler Inhale 2 puffs into the lungs 2 (two) times daily. 1 each 11  . gabapentin (NEURONTIN) 600 MG tablet Take 600 mg by mouth 3 (three) times daily.     Marland Kitchen guaifenesin (HUMIBID E) 400 MG TABS tablet Take by mouth.    . loratadine (CLARITIN) 10 MG tablet TAKE ONE TABLET BY MOUTH DAILY AS NEEDED FOR RUNNY NOSE OR SNEEZING    . montelukast (SINGULAIR) 10 MG  tablet Take 1 tablet (10 mg total) by mouth at bedtime. 30 tablet 11  . olopatadine (PATADAY) 0.1 % ophthalmic solution Place 1 drop into both eyes 2 (two) times daily. 5 mL 0  . omeprazole (PRILOSEC) 40 MG capsule Take 40 mg by mouth daily.     . potassium chloride SA (KLOR-CON) 20 MEQ tablet Take 1 tablet (20 mEq total) by mouth 2 (two) times daily. 180 tablet 1  . valsartan-hydrochlorothiazide (DIOVAN-HCT) 160-25 MG tablet Take 1 tablet by mouth daily. 90 tablet 3  . acetaminophen (TYLENOL) 325 MG tablet Take by mouth. (Patient not taking: Reported on 11/10/2020)     No current facility-administered medications on file prior to visit.    Past  Medical History:  Diagnosis Date  . Allergy   . Arthritis    "everywhere"  . BPH (benign prostatic hypertrophy)   . Chronic rhinitis 01/27/2009  . ED (erectile dysfunction)   . GERD (gastroesophageal reflux disease)   . Headache(784.0)    migraines, as many as 3 in a week    . HYPERLIPIDEMIA 01/27/2009  . HYPERTENSION 01/27/2009  . Mild asthma    seasonal allergies, uses Proair once per yr.   . Mild obstructive sleep apnea    per pt -- mild osa test result--  no rx cpap needed  . Neuromuscular disorder ([place])   . Sleep apnea    no cpap  . Testosterone deficiency   . Urethral stricture   . Vocal cord anomaly    pt states told from [place]  doctor heard a little gurgle sound -- no farther testing done    Past Surgical History:  Procedure Laterality Date  . BACK SURGERY    . COLONOSCOPY    . CYSTOSCOPY WITH URETHRAL DILATATION N/A 01/07/2013   Procedure: CYSTOSCOPY WITH URETHRAL DILATATION BALLOON DILATION ;  Surgeon: Bernestine Amass, MD;  Location: [facility];  Service: Urology;  Laterality: N/A;  . LUMBAR LAMINECTOMY/DECOMPRESSION MICRODISCECTOMY Right 11/29/2012   Procedure: Right Lumbar Four to Five Laminectomy/ Decompression Microdiskectomy;  Surgeon: Otilio Connors, MD;  Location: [facility];  Service: Neurosurgery;  Laterality: Right;  LUMBAR LAMINECTOMY/DECOMPRESSION MICRODISCECTOMY 1 LEVEL  . POLYPECTOMY    . SHOULDER ARTHROSCOPY W/ ACROMIAL REPAIR Left 08-28-2002  . TOTAL KNEE ARTHROPLASTY Left 12/02/2019  . TOTAL KNEE ARTHROPLASTY Left 12/02/2019   Procedure: LEFT TOTAL KNEE ARTHROPLASTY;  Surgeon: Leandrew Koyanagi, MD;  Location: [facility];  Service: Orthopedics;  Laterality: Left;  . UPPER GASTROINTESTINAL ENDOSCOPY      Social History   Socioeconomic History  . Marital status: Married    Spouse name: Not on file  . Number of children: Not on file  . Years of education: Not on file  . Highest education level: Not on file  Occupational History  . Occupation:  retired  Tobacco Use  . Smoking status: Former Smoker    Packs/day: 0.50    Years: 22.00    Pack years: 11.00    Types: Cigarettes    Quit date: 06/27/1988    Years since quitting: 32.3  . Smokeless tobacco: Never Used  Vaping Use  . Vaping Use: Never used  Substance and Sexual Activity  . Alcohol use: Yes    Alcohol/week: 21.0 standard drinks    Types: 21 Shots of liquor per week    Comment: cognac  . Drug use: No  . Sexual activity: Yes    Partners: Female  Other Topics Concern  . Not on file  Social History Narrative  . Not on file   Social Determinants of Health   Financial Resource Strain: Not on file  Food Insecurity: Not on file  Transportation Needs: Not on file  Physical Activity: Not on file  Stress: Not on file  Social Connections: Not on file    Family History  Problem Relation Age of Onset  . Cancer Mother        lung  . Lung cancer Mother   . Heart disease Maternal Grandmother   . Heart disease Maternal Grandfather   . Cancer Maternal Grandfather        colon, prostate,lung  . Colon cancer Neg Hx   . Colon polyps Neg Hx   . Esophageal cancer Neg Hx   . Rectal cancer Neg Hx   . Stomach cancer Neg Hx     Review of Systems  Constitutional: Positive for fatigue. Negative for appetite change, chills and fever.  HENT: Positive for postnasal drip. Negative for congestion, sinus pain and sore throat.        Can hear heart beat in ear  Respiratory: Positive for shortness of breath (worse in last month) and wheezing (asthma related). Negative for cough.   Cardiovascular: Negative for chest pain, palpitations and leg swelling.  Gastrointestinal: Negative for abdominal pain and nausea.       No gerd - controlled  Genitourinary: Negative for dysuria and hematuria.  Musculoskeletal: Positive for arthralgias (chronic - no change) and back pain (chronic - no change).  Neurological: Positive for headaches (occ - migraines). Negative for dizziness and  light-headedness.       Objective:   Vitals:   11/10/20 0926  BP: 138/84  Pulse: 93  Temp: 98.7 F (37.1 C)  SpO2: 96%   BP Readings from Last 3 Encounters:  11/10/20 138/84  10/22/20 134/84  09/25/20 (!) 160/80   Wt Readings from Last 3 Encounters:  11/10/20 221 lb (100.2 kg)  10/22/20 222 lb (100.7 kg)  09/25/20 233 lb 6.4 oz (105.9 kg)   Body mass index is 34.61 kg/m.   Physical Exam Constitutional:      General: He is not in acute distress.    Appearance: Normal appearance. He is not ill-appearing.  HENT:     Head: Normocephalic and atraumatic.  Eyes:     Conjunctiva/sclera: Conjunctivae normal.  Neck:     Vascular: No carotid bruit.  Cardiovascular:     Rate and Rhythm: Normal rate and regular rhythm.     Heart sounds: No murmur heard.   Pulmonary:     Effort: Pulmonary effort is normal. No respiratory distress.     Breath sounds: No wheezing or rales.  Abdominal:     General: There is no distension.     Palpations: Abdomen is soft.     Tenderness: There is no abdominal tenderness. There is no guarding or rebound.  Musculoskeletal:     Cervical back: Neck supple. No tenderness.     Right lower leg: No edema.     Left lower leg: No edema.  Lymphadenopathy:     Cervical: No cervical adenopathy.  Skin:    General: Skin is warm and dry.  Neurological:     Mental Status: He is alert.            Assessment & Plan:    Shortness of breath, fatigue: Has had some dyspnea on exertion and fatigue since COVID the beginning of this year, but the symptoms got much worse in the  past month He is not able to do his usual activities without stopping and resting His exam is unremarkable He has had 2 EKGs this year and [facility]-he is not sure what prompted this Blood work from this year has been normal we will hold off on additional labs at this time Check chest x-ray Echocardiogram ordered Referral to cardiology  ordered    This visit occurred during the SARS-CoV-2 public health emergency.  Safety protocols were in place, including screening questions prior to the visit, additional usage of staff PPE, and extensive cleaning of exam room while observing appropriate contact time as indicated for disinfecting solutions.

## 2020-11-10 ENCOUNTER — Ambulatory Visit (INDEPENDENT_AMBULATORY_CARE_PROVIDER_SITE_OTHER): Payer: Medicare PPO | Admitting: Internal Medicine

## 2020-11-10 ENCOUNTER — Ambulatory Visit (INDEPENDENT_AMBULATORY_CARE_PROVIDER_SITE_OTHER): Payer: Medicare PPO

## 2020-11-10 ENCOUNTER — Other Ambulatory Visit: Payer: Self-pay

## 2020-11-10 ENCOUNTER — Encounter: Payer: Self-pay | Admitting: Internal Medicine

## 2020-11-10 VITALS — BP 138/84 | HR 93 | Temp 98.7°F | Ht 67.0 in | Wt 221.0 lb

## 2020-11-10 DIAGNOSIS — R0602 Shortness of breath: Secondary | ICD-10-CM

## 2020-11-10 DIAGNOSIS — R5383 Other fatigue: Secondary | ICD-10-CM

## 2020-11-10 NOTE — Patient Instructions (Addendum)
   Have a chest xray done today.     Medications changes include :   none   A referral was ordered for Cardiology and an Echo was ordered.        Someone from their office will call you to schedule an appointment.

## 2020-11-20 ENCOUNTER — Ambulatory Visit: Payer: Medicare PPO | Admitting: Orthopaedic Surgery

## 2020-11-27 ENCOUNTER — Encounter: Payer: Self-pay | Admitting: Orthopaedic Surgery

## 2020-11-27 ENCOUNTER — Ambulatory Visit (INDEPENDENT_AMBULATORY_CARE_PROVIDER_SITE_OTHER): Payer: Medicare PPO | Admitting: Pulmonary Disease

## 2020-11-27 ENCOUNTER — Ambulatory Visit (INDEPENDENT_AMBULATORY_CARE_PROVIDER_SITE_OTHER): Payer: Medicare PPO

## 2020-11-27 ENCOUNTER — Ambulatory Visit: Payer: Self-pay

## 2020-11-27 ENCOUNTER — Other Ambulatory Visit: Payer: Self-pay

## 2020-11-27 ENCOUNTER — Ambulatory Visit (INDEPENDENT_AMBULATORY_CARE_PROVIDER_SITE_OTHER): Payer: Medicare PPO | Admitting: Orthopaedic Surgery

## 2020-11-27 ENCOUNTER — Encounter: Payer: Self-pay | Admitting: Pulmonary Disease

## 2020-11-27 VITALS — BP 118/68 | HR 103 | Temp 96.7°F | Ht 67.0 in | Wt 220.6 lb

## 2020-11-27 DIAGNOSIS — J4540 Moderate persistent asthma, uncomplicated: Secondary | ICD-10-CM

## 2020-11-27 DIAGNOSIS — Z96652 Presence of left artificial knee joint: Secondary | ICD-10-CM

## 2020-11-27 DIAGNOSIS — M25562 Pain in left knee: Secondary | ICD-10-CM

## 2020-11-27 DIAGNOSIS — M1711 Unilateral primary osteoarthritis, right knee: Secondary | ICD-10-CM | POA: Diagnosis not present

## 2020-11-27 DIAGNOSIS — H1013 Acute atopic conjunctivitis, bilateral: Secondary | ICD-10-CM | POA: Diagnosis not present

## 2020-11-27 DIAGNOSIS — R0609 Other forms of dyspnea: Secondary | ICD-10-CM

## 2020-11-27 DIAGNOSIS — R0600 Dyspnea, unspecified: Secondary | ICD-10-CM

## 2020-11-27 DIAGNOSIS — M25561 Pain in right knee: Secondary | ICD-10-CM | POA: Diagnosis not present

## 2020-11-27 DIAGNOSIS — J301 Allergic rhinitis due to pollen: Secondary | ICD-10-CM

## 2020-11-27 MED ORDER — METHYLPREDNISOLONE ACETATE 40 MG/ML IJ SUSP
40.0000 mg | INTRAMUSCULAR | Status: AC | PRN
  Administered 2020-11-27: 40 mg via INTRA_ARTICULAR

## 2020-11-27 MED ORDER — ALBUTEROL SULFATE (2.5 MG/3ML) 0.083% IN NEBU: 2.5000 mg | INHALATION_SOLUTION | Freq: Four times a day (QID) | RESPIRATORY_TRACT | 5 refills | Status: AC | PRN

## 2020-11-27 MED ORDER — LIDOCAINE HCL 1 % IJ SOLN
2.0000 mL | INTRAMUSCULAR | Status: AC | PRN
  Administered 2020-11-27: 2 mL

## 2020-11-27 MED ORDER — BUPIVACAINE HCL 0.5 % IJ SOLN
2.0000 mL | INTRAMUSCULAR | Status: AC | PRN
  Administered 2020-11-27: 2 mL via INTRA_ARTICULAR

## 2020-11-27 NOTE — Progress Notes (Signed)
[facility]  Chief Complaint  Patient presents with  . Follow-up    Had flu at beginning of May. Still having low energy and shortness of breath.     Constitutional:  BP 118/68 (BP Location: Right Arm, Cuff Size: Normal)   Pulse (!) 103   Temp (!) 96.7 F (35.9 C) (Temporal)   Ht 5\' 7"  (1.702 m)   Wt 220 lb 9.6 oz (100.1 kg)   SpO2 96% Comment: RA  BMI 34.55 kg/m   Past Medical History:  Low T, HTN, HLD, HA, GERD, BPH, OA, COVID 19 infection January 2022  Past Surgical History:  He  has a past surgical history that includes Lumbar laminectomy/decompression microdiscectomy (Right, 11/29/2012); Shoulder arthroscopy w/ acromial repair (Left, 08-28-2002); Cystoscopy with urethral dilatation (N/A, 01/07/2013); Colonoscopy; Polypectomy; Upper gastrointestinal endoscopy; Back surgery; Total knee arthroplasty (Left, 12/02/2019); and Total knee arthroplasty (Left, 12/02/2019).  Brief Summary:  Douglas Carpenter is a 65 y.o. male former smoker with chronic cough in setting of Allergic asthma, Post nasal drip, GERD, and vocal cord disease. He also has hx of snoring.      Subjective:   He is here with his wife.  He had COVID in January.  They had their pre-school age grandson stay with them.  They both got sick.  COVID test negative at this time.  They both felt like they had the flu, but didn't get tested for this.    He has been using nebulized albuterol and this has helped.  He still feels more short of breath, but better than before.  Has scratch throat and has to clear his throat frequently.  Not having reflux.  Not having sputum.  He has been getting intermittent tachycardia.  Has follow up with [facility]ology at the [facility] later this month.  Physical Exam:   Appearance - well kempt   ENMT - no sinus tenderness, no oral exudate, no LAN, Mallampati 3 airway, no stridor  Respiratory - equal breath sounds bilaterally, no wheezing or rales  CV -  s1s2 regular rate and rhythm, no murmurs  Ext - no clubbing, no edema  Skin - no rashes  Psych - normal mood and affect   Pulmonary testing:   PFT 02/19/09 >> FEV1 2.90 (90%), FEV1% 70, TLC 5.62(93%), DLCO 99%, +BD  PFT 07/21/14 >> FEV1 2.70 (93%), FEV1% 74, TLC 5.97 (91%), RV 2.65 (126%), DLCO 106%, +BD  RAST 07/21/14 >> react to dust mites, grasses; IgE 49  FeNO 10/27/15 >> 228  Sleep Tests:   HST 03/07/13 >> AHI 3.9, SaO2 low 80%  Social History:  He  reports that he quit smoking about 32 years ago. His smoking use included cigarettes. He has a 11.00 pack-year smoking history. He has never used smokeless tobacco. He reports current alcohol use of about 21.0 standard drinks of alcohol per week. He reports that he does not use drugs.  Family History:  His family history includes Cancer in his maternal grandfather and mother; Heart disease in his maternal grandfather and maternal grandmother; Lung cancer in his mother.    Discussion:  He has progressive dyspnea since having COVID 19 infection in January 2022 and another viral respiratory infection in May 2022.  This likely exacerbated his asthma.  He also is being evaluated for tachycardia, and this could contribute to his dyspnea.  Assessment/Plan:   Allergic asthma with exercise induced bronchoconstriction. - he gets scripts from the [place] - continue advair and singulair -  prn albuterol; refilled albuterol for nebulizer - will reassess in a couple of months; if he is still having respiratory issues and cardiac assessment is negative, then might consider adding biologic agent  Upper airway cough syndrome. - continue singulair and claritin - sip water, salt water gargles, 1 tsp local honey prn - avoid forcing cough or throat clearing  Allergic conjunctivitis. - prn pataday  Tachycardia. - he will follow with [facility]ology at the [facility] later this month  Time Spent Involved in Patient Care on Day of Examination:  32  minutes  Follow up:  Patient Instructions  Sent refill for nebulizer albuterol to [facility]  Follow up in 2 months   Medication List:   Allergies as of 11/27/2020      Reactions   Aspirin Rash   Pt can tolerate low doses-- INTOLERANT TO HIGHER DOSES      Medication List       Accurate as of November 27, 2020  4:43 PM. If you have any questions, ask your nurse or doctor.        STOP taking these medications   acetaminophen 325 MG tablet Commonly known as: TYLENOL Stopped by: [name], MD   guaifenesin 400 MG Tabs tablet Commonly known as: HUMIBID E Stopped by: [name], MD   valsartan-hydrochlorothiazide 160-25 MG tablet Commonly known as: DIOVAN-HCT Stopped by: [name], MD     TAKE these medications   Advair HFA 230-21 MCG/ACT inhaler Generic drug: fluticasone-salmeterol Inhale 2 puffs into the lungs 2 (two) times daily.   Aimovig 70 MG/ML Soaj Generic drug: Erenumab-aooe Inject 70 mg into the skin every 30 (thirty) days.   albuterol 108 (90 Base) MCG/ACT inhaler Commonly known as: ProAir HFA Inhale 2 puffs into the lungs every 6 (six) hours as needed for wheezing or shortness of breath. What changed: Another medication with the same name was added. Make sure you understand how and when to take each. Changed by: [name], MD   albuterol (2.5 MG/3ML) 0.083% nebulizer solution Commonly known as: PROVENTIL Take 3 mLs (2.5 mg total) by nebulization every 6 (six) hours as needed for wheezing or shortness of breath. What changed: You were already taking a medication with the same name, and this prescription was added. Make sure you understand how and when to take each. Changed by: [name], MD   aspirin EC 81 MG tablet Take 1 tablet (81 mg total) by mouth 2 (two) times daily.   atorvastatin 80 MG tablet Commonly known as: LIPITOR Take 1 tablet (80 mg total) by mouth at bedtime.   diclofenac Sodium 1 % Gel Commonly known as:  Voltaren Apply 2 g topically 4 (four) times daily.   DULoxetine 60 MG capsule Commonly known as: Cymbalta Take 1 capsule (60 mg total) by mouth daily.   gabapentin 600 MG tablet Commonly known as: NEURONTIN Take 600 mg by mouth 3 (three) times daily.   loratadine 10 MG tablet Commonly known as: CLARITIN TAKE ONE TABLET BY MOUTH DAILY AS NEEDED FOR RUNNY NOSE OR SNEEZING   montelukast 10 MG tablet Commonly known as: SINGULAIR Take 1 tablet (10 mg total) by mouth at bedtime.   olopatadine 0.1 % ophthalmic solution Commonly known as: Pataday Place 1 drop into both eyes 2 (two) times daily.   omeprazole 40 MG capsule Commonly known as: PRILOSEC Take 40 mg by mouth daily.   potassium chloride SA 20 MEQ tablet Commonly known as: KLOR-CON Take 1 tablet (20 mEq total) by  mouth 2 (two) times daily.       Signature:  [name], MD [place] Pager - [phone] 11/27/2020, 4:43 PM

## 2020-11-27 NOTE — Progress Notes (Signed)
Office Visit Note   Patient: Douglas Carpenter           Date of Birth: 06-07-1956           MRN: [id number] Visit Date: 11/27/2020              Requested by: Rocco Serene, MD [place],  [place] [id number] PCP: [name], MD   Assessment & Plan: Visit Diagnoses:  1. Hx of total knee replacement, left   2. Primary osteoarthritis of right knee     Plan: Impression is 1 year status post left total knee replacement and right knee osteoarthritis flareup.  In regards to the left knee, he is doing well.  He will continue to advance with activity as tolerated.  Dental prophylaxis reinforced for another year.  Follow-up with [place] in 1 years time for repeat evaluation and 2 view x-rays of the left knee.  In regards to the right knee, we injected this with cortisone today.  He will follow-up with [place] as needed.  Call with concerns or questions.  Follow-Up Instructions: Return in about 1 year (around 11/27/2021).   Orders:  Orders Placed This Encounter  Procedures  . XR KNEE 3 VIEW LEFT  . XR KNEE 3 VIEW RIGHT   No orders of the defined types were placed in this encounter.     Procedures: Large Joint Inj: R knee on 11/27/2020 3:07 PM Indications: pain Details: 22 G needle  Arthrogram: No  Medications: 40 mg methylPREDNISolone acetate 40 MG/ML; 2 mL lidocaine 1 %; 2 mL bupivacaine 0.5 % Consent was given by the patient. Patient was prepped and draped in the usual sterile fashion.       Clinical Data: No additional findings.   Subjective: Chief Complaint  Patient presents with  . Right Knee - Pain  . Left Knee - Pain    HPI patient is a pleasant 65 year old gentleman who comes in today 1 year out left total knee replacement in addition to chronic right knee pain.  In regards to the left knee, he is doing well.  He notes a weird sensation when trying to kneel but this is not painful.  No other complaints.  He continues to complain of pain to the right knee.  History  of degenerative joint disease.  He has had cortisone injections in the past with the last one being in November with good relief until recently.  The pain he has is worse with activity and at the end of the day.  His requesting another cortisone injection if possible.  Review of Systems as detailed in HPI.  All others reviewed and are negative.   Objective: Vital Signs: There were no vitals taken for this visit.  Physical Exam well-developed and well-nourished gentleman in no acute distress.  Alert and oriented x3.  Ortho Exam left knee exam shows range of motion from 0 to 130 degrees.  Stable to valgus varus stress.  He is neurovascular intact distally.  Right knee exam shows range of motion from 0 to 125 degrees.  No joint line tenderness.  Moderate patellofemoral crepitus.  Ligaments are stable.  He is neurovascular intact distally.  Specialty Comments:  No specialty comments available.  Imaging: XR KNEE 3 VIEW LEFT  Result Date: 11/27/2020 Well-seated prosthesis without complication  XR KNEE 3 VIEW RIGHT  Result Date: 11/27/2020 X-rays reveal moderate tricompartmental degenerative changes    PMFS History: Patient Active Problem List   Diagnosis Date Noted  .  Diuretic-induced hypokalemia 10/22/2020  . Encounter for general adult medical examination with abnormal findings 08/05/2020  . Rising PSA level 08/04/2020  . Stage 3a chronic kidney disease ([place]) 08/04/2020  . Chronic hyperglycemia 08/03/2020  . Primary hypertension 08/03/2020  . Benign prostatic hyperplasia without lower urinary tract symptoms 08/03/2020  . Hyperlipidemia with target LDL less than 130 08/03/2020  . Primary osteoarthritis of left knee 04/16/2019  . Primary osteoarthritis of right knee 02/20/2019  . Exercise-induced asthma 07/21/2014  . Chronic cough 07/21/2014  . Upper airway cough syndrome 07/21/2014  . Allergic rhinitis 07/21/2014  . Lumbar stenosis 11/06/2013  . Urethral stricture 01/07/2013  .  VOCAL CORD DISORDER 02/19/2009  . Asthma 01/27/2009  . GERD 01/27/2009   Past Medical History:  Diagnosis Date  . Allergy   . Arthritis    "everywhere"  . BPH (benign prostatic hypertrophy)   . Chronic rhinitis 01/27/2009  . ED (erectile dysfunction)   . GERD (gastroesophageal reflux disease)   . Headache(784.0)    migraines, as many as 3 in a week    . HYPERLIPIDEMIA 01/27/2009  . HYPERTENSION 01/27/2009  . Mild asthma    seasonal allergies, uses Proair once per yr.   . Mild obstructive sleep apnea    per pt -- mild osa test result--  no rx cpap needed  . Neuromuscular disorder ([place])   . Sleep apnea    no cpap  . Testosterone deficiency   . Urethral stricture   . Vocal cord anomaly    pt states told from [place]  doctor heard a little gurgle sound -- no farther testing done    Family History  Problem Relation Age of Onset  . Cancer Mother        lung  . Lung cancer Mother   . Heart disease Maternal Grandmother   . Heart disease Maternal Grandfather   . Cancer Maternal Grandfather        colon, prostate,lung  . Colon cancer Neg Hx   . Colon polyps Neg Hx   . Esophageal cancer Neg Hx   . Rectal cancer Neg Hx   . Stomach cancer Neg Hx     Past Surgical History:  Procedure Laterality Date  . BACK SURGERY    . COLONOSCOPY    . CYSTOSCOPY WITH URETHRAL DILATATION N/A 01/07/2013   Procedure: CYSTOSCOPY WITH URETHRAL DILATATION BALLOON DILATION ;  Surgeon: Bernestine Amass, MD;  Location[facility];  Service: Urology;  Laterality: N/A;  . LUMBAR LAMINECTOMY/DECOMPRESSION MICRODISCECTOMY Right 11/29/2012   Procedure: Right Lumbar Four to Five Laminectomy/ Decompression Microdiskectomy;  Surgeon: Otilio Connors, MD;  Location: [facility];  Service: Neurosurgery;  Laterality: Right;  LUMBAR LAMINECTOMY/DECOMPRESSION MICRODISCECTOMY 1 LEVEL  . POLYPECTOMY    . SHOULDER ARTHROSCOPY W/ ACROMIAL REPAIR Left 08-28-2002  . TOTAL KNEE ARTHROPLASTY Left 12/02/2019  . TOTAL  KNEE ARTHROPLASTY Left 12/02/2019   Procedure: LEFT TOTAL KNEE ARTHROPLASTY;  Surgeon: Leandrew Koyanagi, MD;  Location: [facility];  Service: Orthopedics;  Laterality: Left;  . UPPER GASTROINTESTINAL ENDOSCOPY     Social History   Occupational History  . Occupation: retired  Tobacco Use  . Smoking status: Former Smoker    Packs/day: 0.50    Years: 22.00    Pack years: 11.00    Types: Cigarettes    Quit date: 06/27/1988    Years since quitting: 32.4  . Smokeless tobacco: Never Used  Vaping Use  . Vaping Use: Never used  Substance and  Sexual Activity  . Alcohol use: Yes    Alcohol/week: 21.0 standard drinks    Types: 21 Shots of liquor per week    Comment: cognac  . Drug use: No  . Sexual activity: Yes    Partners: Female

## 2020-11-27 NOTE — Patient Instructions (Signed)
Sent refill for nebulizer albuterol to [facility]  Follow up in 2 months

## 2020-12-01 DIAGNOSIS — Z981 Arthrodesis status: Secondary | ICD-10-CM | POA: Diagnosis not present

## 2020-12-01 DIAGNOSIS — M461 Sacroiliitis, not elsewhere classified: Secondary | ICD-10-CM | POA: Diagnosis not present

## 2020-12-01 DIAGNOSIS — M47816 Spondylosis without myelopathy or radiculopathy, lumbar region: Secondary | ICD-10-CM | POA: Diagnosis not present

## 2020-12-07 ENCOUNTER — Ambulatory Visit: Payer: Medicare PPO | Admitting: Internal Medicine

## 2020-12-07 DIAGNOSIS — Z0289 Encounter for other administrative examinations: Secondary | ICD-10-CM

## 2020-12-14 ENCOUNTER — Ambulatory Visit (HOSPITAL_COMMUNITY)
Admission: RE | Admit: 2020-12-14 | Discharge: 2020-12-14 | Disposition: A | Discharge status: Home / Self Care | Payer: Medicare PPO | Source: Ambulatory Visit | Attending: Internal Medicine | Admitting: Internal Medicine

## 2020-12-14 ENCOUNTER — Telehealth: Payer: Self-pay | Admitting: Internal Medicine

## 2020-12-14 ENCOUNTER — Other Ambulatory Visit: Payer: Self-pay

## 2020-12-14 DIAGNOSIS — I119 Hypertensive heart disease without heart failure: Secondary | ICD-10-CM | POA: Diagnosis not present

## 2020-12-14 DIAGNOSIS — E785 Hyperlipidemia, unspecified: Secondary | ICD-10-CM | POA: Diagnosis not present

## 2020-12-14 DIAGNOSIS — R0602 Shortness of breath: Secondary | ICD-10-CM | POA: Insufficient documentation

## 2020-12-14 LAB — ECHOCARDIOGRAM COMPLETE
Area-P 1/2: 2.91 cm2
S' Lateral: 2.9 cm

## 2020-12-14 NOTE — Telephone Encounter (Signed)
Patients wife called and is requesting information about a bracelet that can let others know that the patient has hearing issues. Please advise

## 2020-12-15 NOTE — Telephone Encounter (Signed)
Spoke to the pt and suggested getting a medical bracelet and having his hearing issues engraved on the back of it. Unsure of a bracelet that is made specifically for that issue.   Pt expressed understanding.

## 2020-12-21 ENCOUNTER — Other Ambulatory Visit: Payer: Self-pay | Admitting: Internal Medicine

## 2020-12-21 DIAGNOSIS — I10 Essential (primary) hypertension: Secondary | ICD-10-CM

## 2020-12-22 ENCOUNTER — Ambulatory Visit: Admitting: Pulmonary Disease

## 2020-12-24 DIAGNOSIS — M461 Sacroiliitis, not elsewhere classified: Secondary | ICD-10-CM | POA: Diagnosis not present

## 2021-01-19 DIAGNOSIS — M47816 Spondylosis without myelopathy or radiculopathy, lumbar region: Secondary | ICD-10-CM | POA: Diagnosis not present

## 2021-01-19 DIAGNOSIS — M461 Sacroiliitis, not elsewhere classified: Secondary | ICD-10-CM | POA: Diagnosis not present

## 2021-01-19 DIAGNOSIS — Z981 Arthrodesis status: Secondary | ICD-10-CM | POA: Diagnosis not present

## 2021-01-19 DIAGNOSIS — M5416 Radiculopathy, lumbar region: Secondary | ICD-10-CM | POA: Diagnosis not present

## 2021-01-21 ENCOUNTER — Ambulatory Visit (INDEPENDENT_AMBULATORY_CARE_PROVIDER_SITE_OTHER): Payer: Medicare PPO | Admitting: Interventional Cardiology

## 2021-01-21 ENCOUNTER — Encounter: Payer: Self-pay | Admitting: Interventional Cardiology

## 2021-01-21 ENCOUNTER — Other Ambulatory Visit: Payer: Self-pay

## 2021-01-21 VITALS — BP 146/90 | HR 87 | Ht 67.0 in | Wt 220.4 lb

## 2021-01-21 DIAGNOSIS — R000 Tachycardia, unspecified: Secondary | ICD-10-CM

## 2021-01-21 DIAGNOSIS — E669 Obesity, unspecified: Secondary | ICD-10-CM | POA: Diagnosis not present

## 2021-01-21 DIAGNOSIS — I10 Essential (primary) hypertension: Secondary | ICD-10-CM | POA: Diagnosis not present

## 2021-01-21 NOTE — Patient Instructions (Signed)
Medication Instructions:  Your physician recommends that you continue on your current medications as directed. Please refer to the Current Medication list given to you today.  *If you need a refill on your cardiac medications before your next appointment, please call your pharmacy*   Lab Work: none If you have labs (blood work) drawn today and your tests are completely normal, you will receive your results only by: [place] (if you have MyChart) OR A paper copy in the mail If you have any lab test that is abnormal or we need to change your treatment, we will call you to review the results.   Testing/Procedures: none   Follow-Up: At [facility], you and your health needs are our priority.  As part of our continuing mission to provide you with exceptional heart care, we have created designated Provider Care Teams.  These Care Teams include your primary Cardiologist (physician) and Advanced Practice Providers (APPs -  Physician Assistants and Nurse Practitioners) who all work together to provide you with the care you need, when you need it.  We recommend signing up for the patient portal called "MyChart".  Sign up information is provided on this After Visit Summary.  MyChart is used to connect with patients for Virtual Visits (Telemedicine).  Patients are able to view lab/test results, encounter notes, upcoming appointments, etc.  Non-urgent messages can be sent to your provider as well.   To learn more about what you can do with MyChart, go to [email or web address].    Your next appointment:   12 month(s)  The format for your next appointment:   In Person  Provider:   You may see Larae Grooms, MD or one of the following Advanced Practice Providers on your designated Care Team:   Melina Copa, PA-C Ermalinda Barrios, PA-C   Other Instructions  Monitor blood pressure at home and let us know if >140/90.  Call office with medication list.  Specifically need to know if you  are taking Valsartan/HCTZ  High-Fiber Eating Plan Fiber, also called dietary fiber, is a type of carbohydrate. It is found foods such as fruits, vegetables, whole grains, and beans. A high-fiber diet can have many health benefits. Your health care provider may recommend a high-fiber diet to help: Prevent constipation. Fiber can make your bowel movements more regular. Lower your cholesterol. Relieve the following conditions: Inflammation of veins in the anus (hemorrhoids). Inflammation of specific areas of the digestive tract (uncomplicated diverticulosis). A problem of the large intestine, also called the colon, that sometimes causes pain and diarrhea (irritable bowel syndrome, or IBS). Prevent overeating as part of a weight-loss plan. Prevent heart disease, type 2 diabetes, and certain cancers. What are tips for following this plan? Reading food labels  Check the nutrition facts label on food products for the amount of dietary fiber. Choose foods that have 5 grams of fiber or more per serving. The goals for recommended daily fiber intake include: Men (age 44 or younger): 34-38 g. Men (over age 59): 28-34 g. Women (age 7 or younger): 25-28 g. Women (over age 25): 22-25 g. Your daily fiber goal is _____________ g. Shopping Choose whole fruits and vegetables instead of processed forms, such as apple juice or applesauce. Choose a wide variety of high-fiber foods such as avocados, lentils, oats, and kidney beans. Read the nutrition facts label of the foods you choose. Be aware of foods with added fiber. These foods often have high sugar and sodium amounts per serving. Cooking Use whole-grain flour for  baking and cooking. Cook with brown rice instead of white rice. Meal planning Start the day with a breakfast that is high in fiber, such as a cereal that contains 5 g of fiber or more per serving. Eat breads and cereals that are made with whole-grain flour instead of refined flour or white  flour. Eat brown rice, bulgur wheat, or millet instead of white rice. Use beans in place of meat in soups, salads, and pasta dishes. Be sure that half of the grains you eat each day are whole grains. General information You can get the recommended daily intake of dietary fiber by: Eating a variety of fruits, vegetables, grains, nuts, and beans. Taking a fiber supplement if you are not able to take in enough fiber in your diet. It is better to get fiber through food than from a supplement. Gradually increase how much fiber you consume. If you increase your intake of dietary fiber too quickly, you may have bloating, cramping, or gas. Drink plenty of water to help you digest fiber. Choose high-fiber snacks, such as berries, raw vegetables, nuts, and popcorn. What foods should I eat? Fruits Berries. Pears. Apples. Oranges. Avocado. Prunes and raisins. Dried figs. Vegetables Sweet potatoes. Spinach. Kale. Artichokes. Cabbage. Broccoli. Cauliflower.Green peas. Carrots. Squash. Grains Whole-grain breads. Multigrain cereal. Oats and oatmeal. Brown rice. Barley.Bulgur wheat. [place]bree. Quinoa. Bran muffins. Popcorn. Rye wafer crackers. Meats and other proteins Navy beans, kidney beans, and pinto beans. Soybeans. Split peas. Lentils. Nutsand seeds. Dairy Fiber-fortified yogurt. Beverages Fiber-fortified soy milk. Fiber-fortified orange juice. Other foods Fiber bars. The items listed above may not be a complete list of recommended foods and beverages. Contact a dietitian for more information. What foods should I avoid? Fruits Fruit juice. Cooked, strained fruit. Vegetables Fried potatoes. Canned vegetables. Well-cooked vegetables. Grains White bread. Pasta made with refined flour. White rice. Meats and other proteins Fatty cuts of meat. Fried chicken or fried fish. Dairy Milk. Yogurt. Cream cheese. Sour cream. Fats and oils Butters. Beverages Soft drinks. Other foods Cakes and  pastries. The items listed above may not be a complete list of foods and beverages to avoid. Talk with your dietitian about what choices are best for you. Summary Fiber is a type of carbohydrate. It is found in foods such as fruits, vegetables, whole grains, and beans. A high-fiber diet has many benefits. It can help to prevent constipation, lower blood cholesterol, aid weight loss, and reduce your risk of heart disease, diabetes, and certain cancers. Increase your intake of fiber gradually. Increasing fiber too quickly may cause cramping, bloating, and gas. Drink plenty of water while you increase the amount of fiber you consume. The best sources of fiber include whole fruits and vegetables, whole grains, nuts, seeds, and beans. This information is not intended to replace advice given to you by your health care provider. Make sure you discuss any questions you have with your healthcare provider. Document Revised: 10/17/2019 Document Reviewed: 10/17/2019 Elsevier Patient Education  2022 [employer].

## 2021-01-21 NOTE — Progress Notes (Signed)
Cardiology Office Note   Date:  01/21/2021   ID:  Douglas Carpenter, DOB 03/23/1956, MRN [id number]  PCP:  [name], MD    No chief complaint on file.  Palpitations/arrhythmia  Wt Readings from Last 3 Encounters:  01/21/21 220 lb 6.4 oz (100 kg)  11/27/20 220 lb 9.6 oz (100.1 kg)  11/10/20 221 lb (100.2 kg)       History of Present Illness: Douglas Carpenter is a 64 y.o. male who is being seen today for the evaluation of arrhythmia at the request of Binnie Rail, MD.   He had COVID in Jan 2022. He has had some shortness of breath which is chronic for him due to asthma.   He was told there was an irregularity to his heart in 4/22 with Dr. Quay Burow and at the [facility].  He had fatigue at that time.  HR was up to 120 bpm.  Now it has decreased.   Records from Dr. Quay Burow showed: "Has had some dyspnea on exertion and fatigue since COVID the beginning of this year, but the symptoms got much worse in the past month He is not able to do his usual activities without stopping and resting His exam is unremarkable He has had 2 EKGs this year and both normal [facility]-he is not sure what prompted this Blood work from this year has been normal we will hold off on additional labs at this time Check chest x-ray Echocardiogram ordered Referral to cardiology "  He had ben on atenolol in the past but it was stopped in early 2022.   He is taking valsartan and indapamide at home, possibly HCTZ.   Of late, Denies : Chest pain. Dizziness. Leg edema. Nitroglycerin use. Orthopnea. Palpitations. Paroxysmal nocturnal dyspnea. Shortness of breath. Syncope.    Past Medical History:  Diagnosis Date   Allergy    Arthritis    "everywhere"   BPH (benign prostatic hypertrophy)    Chronic rhinitis 01/27/2009   ED (erectile dysfunction)    GERD (gastroesophageal reflux disease)    Headache(784.0)    migraines, as many as 3 in a week     HYPERLIPIDEMIA 01/27/2009    HYPERTENSION 01/27/2009   Mild asthma    seasonal allergies, uses Proair once per yr.    Mild obstructive sleep apnea    per pt -- mild osa test result--  no rx cpap needed   Neuromuscular disorder ([facility])    Sleep apnea    no cpap   SOB (shortness of breath)    Testosterone deficiency    Urethral stricture    Vocal cord anomaly    pt states told from [place]  doctor heard a little gurgle sound -- no farther testing done    Past Surgical History:  Procedure Laterality Date   BACK SURGERY     COLONOSCOPY     CYSTOSCOPY WITH URETHRAL DILATATION N/A 01/07/2013   Procedure: CYSTOSCOPY WITH URETHRAL DILATATION BALLOON DILATION ;  Surgeon: Bernestine Amass, MD;  Location: [facility];  Service: Urology;  Laterality: N/A;   LUMBAR LAMINECTOMY/DECOMPRESSION MICRODISCECTOMY Right 11/29/2012   Procedure: Right Lumbar Four to Five Laminectomy/ Decompression Microdiskectomy;  Surgeon: Otilio Connors, MD;  Location: [facility];  Service: Neurosurgery;  Laterality: Right;  LUMBAR LAMINECTOMY/DECOMPRESSION MICRODISCECTOMY 1 LEVEL   POLYPECTOMY     SHOULDER ARTHROSCOPY W/ ACROMIAL REPAIR Left 08-28-2002   TOTAL KNEE ARTHROPLASTY Left 12/02/2019  TOTAL KNEE ARTHROPLASTY Left 12/02/2019   Procedure: LEFT TOTAL KNEE ARTHROPLASTY;  Surgeon: Leandrew Koyanagi, MD;  Location: [facility];  Service: Orthopedics;  Laterality: Left;   UPPER GASTROINTESTINAL ENDOSCOPY       Current Outpatient Medications  Medication Sig Dispense Refill   albuterol (PROAIR HFA) 108 (90 Base) MCG/ACT inhaler Inhale 2 puffs into the lungs every 6 (six) hours as needed for wheezing or shortness of breath. 1 Inhaler 3   albuterol (PROVENTIL) (2.5 MG/3ML) 0.083% nebulizer solution Take 3 mLs (2.5 mg total) by nebulization every 6 (six) hours as needed for wheezing or shortness of breath. 360 mL 5   aspirin EC 81 MG tablet Take 1 tablet (81 mg total) by mouth 2 (two) times daily. 84 tablet 0   atorvastatin (LIPITOR) 80 MG tablet Take  1 tablet (80 mg total) by mouth at bedtime. 90 tablet 1   diclofenac Sodium (VOLTAREN) 1 % GEL Apply 2 g topically 4 (four) times daily. 150 g 0   DULoxetine (CYMBALTA) 60 MG capsule Take 1 capsule (60 mg total) by mouth daily. 90 capsule 1   Erenumab-aooe (AIMOVIG) 70 MG/ML SOAJ Inject 70 mg into the skin every 30 (thirty) days.     fluticasone-salmeterol (ADVAIR HFA) 230-21 MCG/ACT inhaler Inhale 2 puffs into the lungs 2 (two) times daily. 1 each 11   gabapentin (NEURONTIN) 600 MG tablet Take 600 mg by mouth 3 (three) times daily.      loratadine (CLARITIN) 10 MG tablet TAKE ONE TABLET BY MOUTH DAILY AS NEEDED FOR RUNNY NOSE OR SNEEZING     montelukast (SINGULAIR) 10 MG tablet Take 1 tablet (10 mg total) by mouth at bedtime. 30 tablet 11   olopatadine (PATADAY) 0.1 % ophthalmic solution Place 1 drop into both eyes 2 (two) times daily. 5 mL 0   omeprazole (PRILOSEC) 40 MG capsule Take 40 mg by mouth daily.      potassium chloride SA (KLOR-CON) 20 MEQ tablet Take 1 tablet (20 mEq total) by mouth 2 (two) times daily. 180 tablet 1   No current facility-administered medications for this visit.    Allergies:   Aspirin    Social History:  The patient  reports that he quit smoking about 32 years ago. His smoking use included cigarettes. He has a 11.00 pack-year smoking history. He has never used smokeless tobacco. He reports current alcohol use of about 21.0 standard drinks of alcohol per week. He reports that he does not use drugs.   Family History:  The patient's family history includes Cancer in his maternal grandfather and mother; Heart disease in his maternal grandfather and maternal grandmother; Lung cancer in his mother.    ROS:  Please see the history of present illness.   Otherwise, review of systems are positive for trying to lose weight.   All other systems are reviewed and negative.    PHYSICAL EXAM: VS:  BP (!) 146/90   Pulse 87   Ht '5\' 7"'$  (1.702 m)   Wt 220 lb 6.4 oz (100 kg)    SpO2 96%   BMI 34.52 kg/m  , BMI Body mass index is 34.52 kg/m. GEN: Well nourished, well developed, in no acute distress HEENT: normal Neck: no JVD, carotid bruits, or masses Cardiac: RRR; no murmurs, rubs, or gallops,no edema  Respiratory:  clear to auscultation bilaterally, normal work of breathing GI: soft, nontender, nondistended, + BS MS: no deformity or atrophy Skin: warm and dry, no rash Neuro:  Strength and sensation  are intact Psych: euthymic mood, full affect   EKG:   The ekg ordered today demonstrates NSR, no ST segment changes   Recent Labs: 08/03/2020: ALT 26; Hemoglobin 14.0; Platelets 202.0; TSH 1.69 10/22/2020: BUN 25; Creatinine, Ser 1.17; Potassium 3.3; Sodium 138   Lipid Panel    Component Value Date/Time   CHOL 218 (H) 08/03/2020 1506   CHOL 222 (H) 04/19/2017 1109   TRIG 149.0 08/03/2020 1506   HDL 64.00 08/03/2020 1506   HDL 72 04/19/2017 1109   CHOLHDL 3 08/03/2020 1506   VLDL 29.8 08/03/2020 1506   LDLCALC 124 (H) 08/03/2020 1506   LDLCALC 137 (H) 04/19/2017 1109     Other studies Reviewed: Additional studies/ records that were reviewed today with results demonstrating: Labs reviewed.   ASSESSMENT AND PLAN:  Tachycardia: Improving.  Occurred after COVID.  May have just been part of a longer recovery required with his COVID infection.  We will try to obtain Holter report from the [place].  Echo showed normal LVEF. HTN: He will call our office to verify meds.  He should have stopped indapamide.  Apparently Coreg was stopped in April 2022.  He mentioned being on atenolol in the past.  If BP stays high, may need to add back Coreg. In Epic, it also appears that valsartan - HCTZ was cancelled by pulmonary in June 2022, because he was not taking. We need to verify meds.  Obesity: Continue weight loss efforts.  LDL 124.  He may be a reasonable candidate for a calcium scoring CT to see if he would benefit from more intense lipid-lowering therapy.  From a cardiac  standpoint, he can drop down to aspirin 81 mg daily.  Will defer to Dr. Ronnald Ramp if there is another reason for him to be on the higher dose aspirin.   Current medicines are reviewed at length with the patient today.  The patient concerns regarding his medicines were addressed.  The following changes have been made:  No change  Labs/ tests ordered today include:  No orders of the defined types were placed in this encounter.   Recommend 150 minutes/week of aerobic exercise Low fat, low carb, high fiber diet recommended  Disposition:   FU in 1 year   Signed, Larae Grooms, MD  01/21/2021 3:21 PM    [facility] [address], [place], [place]  [address] Phone: [phone]; Fax: [phone]

## 2021-01-22 ENCOUNTER — Telehealth: Payer: Self-pay | Admitting: Interventional Cardiology

## 2021-01-22 MED ORDER — CARVEDILOL 12.5 MG PO TABS: 12.5000 mg | ORAL_TABLET | Freq: Two times a day (BID) | ORAL | 3 refills | Status: DC

## 2021-01-22 NOTE — Telephone Encounter (Signed)
Patient notified.  Prescription sent to [facility]

## 2021-01-22 NOTE — Telephone Encounter (Signed)
Increase Coreg to 12.5 mg BID.  Please keep track of HR and BP.    Await monitor from [place].   May ultimately need a referral to PharmD HTN clinic.  JV

## 2021-01-22 NOTE — Telephone Encounter (Signed)
Pt c/o medication issue:  1. Name of Medication:  Carvedilol 6.25 MG Indapamide 2.5 MG  2. How are you currently taking this medication (dosage and times per day)? Carvedilol 1 tablet twice a day, indapamide once daily   3. Are you having a reaction (difficulty breathing--STAT)? No   4. What is your medication issue? Douglas Carpenter is calling to inform Dr. Irish Lack of the medication he is on for his BP.

## 2021-01-28 ENCOUNTER — Telehealth: Payer: Self-pay | Admitting: Internal Medicine

## 2021-01-28 NOTE — Telephone Encounter (Signed)
Type of form received: [facility] Disability form   Additional comments:   Received by: [place]   Form should be Faxed to: n/a  Form should be mailed to:  n/a  Is patient requesting call for pickup: y - [phone]   Form placed in the Provider's box.  *Attach charge sheet.  Provider will determine charge.*  Was patient informed of  7-10 business day turn around (Y/N)? y

## 2021-01-29 NOTE — Telephone Encounter (Signed)
Forms completed and placed on PCPs desk for review and signature.

## 2021-02-01 DIAGNOSIS — Z0279 Encounter for issue of other medical certificate: Secondary | ICD-10-CM

## 2021-02-01 NOTE — Telephone Encounter (Signed)
Forms have been signed and completed. Pt has been made aware and will come by the office to pick them up.    Forms placed at front desk.

## 2021-02-02 ENCOUNTER — Other Ambulatory Visit: Payer: Self-pay

## 2021-02-02 ENCOUNTER — Ambulatory Visit (INDEPENDENT_AMBULATORY_CARE_PROVIDER_SITE_OTHER): Payer: Medicare PPO | Admitting: Pulmonary Disease

## 2021-02-02 ENCOUNTER — Encounter: Payer: Self-pay | Admitting: Pulmonary Disease

## 2021-02-02 VITALS — BP 140/72 | HR 94 | Temp 98.4°F | Ht 67.0 in | Wt 220.0 lb

## 2021-02-02 DIAGNOSIS — J301 Allergic rhinitis due to pollen: Secondary | ICD-10-CM | POA: Diagnosis not present

## 2021-02-02 DIAGNOSIS — J4540 Moderate persistent asthma, uncomplicated: Secondary | ICD-10-CM | POA: Diagnosis not present

## 2021-02-02 DIAGNOSIS — H1013 Acute atopic conjunctivitis, bilateral: Secondary | ICD-10-CM

## 2021-02-02 MED ORDER — FLUTICASONE-SALMETEROL 250-50 MCG/ACT IN AEPB: 1.0000 | INHALATION_SPRAY | Freq: Two times a day (BID) | RESPIRATORY_TRACT | 3 refills | Status: DC

## 2021-02-02 NOTE — Progress Notes (Signed)
[facility]  Chief Complaint  Patient presents with   Follow-up    Sob occass., cough-dry     Constitutional:  BP 140/72 (BP Location: Left Arm, Cuff Size: Normal)   Pulse 94   Temp 98.4 F (36.9 C) (Temporal)   Ht '5\' 7"'$  (1.702 m)   Wt 220 lb (99.8 kg)   SpO2 98%   BMI 34.46 kg/m   Past Medical History:  Low T, HTN, HLD, HA, GERD, BPH, OA, COVID 19 infection January 2022  Past Surgical History:  He  has a past surgical history that includes Lumbar laminectomy/decompression microdiscectomy (Right, 11/29/2012); Shoulder arthroscopy w/ acromial repair (Left, 08-28-2002); Cystoscopy with urethral dilatation (N/A, 01/07/2013); Colonoscopy; Polypectomy; Upper gastrointestinal endoscopy; Back surgery; Total knee arthroplasty (Left, 12/02/2019); and Total knee arthroplasty (Left, 12/02/2019).  Brief Summary:  Douglas Carpenter is a 65 y.o. male former smoker with chronic cough in setting of Allergic asthma, Post nasal drip, GERD, and vocal cord disease.  He also has hx of snoring.      Subjective:   He is here with his wife.  He had chest xray 11/10/20 that showed hyperinflation.  He was seen by cardiology.  Echo was normal.  Waiting to hear from [place] about holter monitor.  Breathing better.  Occasional cough and throat clearing.  Sleeping better with adjustable bed.  Physical Exam:   Appearance - well kempt   ENMT - no sinus tenderness, no oral exudate, no LAN, Mallampati 3 airway, no stridor  Respiratory - equal breath sounds bilaterally, no wheezing or rales  CV - s1s2 regular rate and rhythm, no murmurs  Ext - no clubbing, no edema  Skin - no rashes  Psych - normal mood and affect   Pulmonary testing:  PFT 02/19/09 >> FEV1 2.90 (90%), FEV1% 70, TLC 5.62(93%), DLCO 99%, +BD PFT 07/21/14 >> FEV1 2.70 (93%), FEV1% 74, TLC 5.97 (91%), RV 2.65 (126%), DLCO 106%, +BD RAST 07/21/14 >> react to dust mites, grasses; IgE 49 FeNO 10/27/15 >>  228  Sleep Tests:  HST 03/07/13 >> AHI 3.9, SaO2 low 80%  Cardiac Tests:  Echo 12/14/20 >> EF 55 to 60%  Social History:  He  reports that he quit smoking about 32 years ago. His smoking use included cigarettes. He has a 11.00 pack-year smoking history. He has never used smokeless tobacco. He reports current alcohol use of about 21.0 standard drinks of alcohol per week. He reports that he does not use drugs.  Family History:  His family history includes Cancer in his maternal grandfather and mother; Heart disease in his maternal grandfather and maternal grandmother; Lung cancer in his mother.     Assessment/Plan:   Allergic asthma with exercise induced bronchoconstriction. - he gets scripts from the [place] - continue wixela and singulair - prn albuterol - continue advair and singulair - prn albuterol; refilled albuterol for nebulizer - consider adding biologic agent if symptoms progress  Upper airway cough syndrome. - continue singulair and claritin   Allergic conjunctivitis. - prn pataday  Tachycardia. - seen by Dr. Casandra Doffing with [facility]   Time Spent Involved in Patient Care on Day of Examination:  24 minutes  Follow up:   Patient Instructions  Follow up in 6 months  Medication List:   Allergies as of 02/02/2021       Reactions   Aspirin Rash   Pt can tolerate low doses-- INTOLERANT TO HIGHER DOSES  Medication List        Accurate as of February 02, 2021  9:36 AM. If you have any questions, ask your nurse or doctor.          Aimovig 70 MG/ML Soaj Generic drug: Erenumab-aooe Inject 70 mg into the skin every 30 (thirty) days.   albuterol 108 (90 Base) MCG/ACT inhaler Commonly known as: ProAir HFA Inhale 2 puffs into the lungs every 6 (six) hours as needed for wheezing or shortness of breath.   albuterol (2.5 MG/3ML) 0.083% nebulizer solution Commonly known as: PROVENTIL Take 3 mLs (2.5 mg total) by nebulization every 6 (six) hours as  needed for wheezing or shortness of breath.   aspirin EC 81 MG tablet Take 1 tablet (81 mg total) by mouth 2 (two) times daily.   atorvastatin 80 MG tablet Commonly known as: LIPITOR Take 1 tablet (80 mg total) by mouth at bedtime.   carvedilol 12.5 MG tablet Commonly known as: COREG Take 1 tablet (12.5 mg total) by mouth 2 (two) times daily.   diclofenac Sodium 1 % Gel Commonly known as: Voltaren Apply 2 g topically 4 (four) times daily.   DULoxetine 60 MG capsule Commonly known as: Cymbalta Take 1 capsule (60 mg total) by mouth daily.   fluticasone-salmeterol 250-50 MCG/ACT Aepb Commonly known as: Wixela Inhub Inhale 1 puff into the lungs in the morning and at bedtime. What changed: Another medication with the same name was removed. Continue taking this medication, and follow the directions you see here. Changed by: [name], MD   gabapentin 600 MG tablet Commonly known as: NEURONTIN Take 600 mg by mouth 3 (three) times daily.   indapamide 2.5 MG tablet Commonly known as: LOZOL Take 2.5 mg by mouth daily.   loratadine 10 MG tablet Commonly known as: CLARITIN TAKE ONE TABLET BY MOUTH DAILY AS NEEDED FOR RUNNY NOSE OR SNEEZING   montelukast 10 MG tablet Commonly known as: SINGULAIR Take 1 tablet (10 mg total) by mouth at bedtime.   olopatadine 0.1 % ophthalmic solution Commonly known as: Pataday Place 1 drop into both eyes 2 (two) times daily.   omeprazole 40 MG capsule Commonly known as: PRILOSEC Take 40 mg by mouth daily.   potassium chloride SA 20 MEQ tablet Commonly known as: KLOR-CON Take 1 tablet (20 mEq total) by mouth 2 (two) times daily.        Signature:  [name], MD [facility] Pager - [phone] 02/02/2021, 9:36 AM

## 2021-02-02 NOTE — Patient Instructions (Signed)
Follow up in 6 months 

## 2021-02-23 ENCOUNTER — Other Ambulatory Visit: Payer: Self-pay

## 2021-02-23 ENCOUNTER — Encounter: Payer: Self-pay | Admitting: Orthopaedic Surgery

## 2021-02-23 ENCOUNTER — Ambulatory Visit (INDEPENDENT_AMBULATORY_CARE_PROVIDER_SITE_OTHER): Payer: Medicare PPO | Admitting: Orthopaedic Surgery

## 2021-02-23 VITALS — Ht 67.0 in | Wt 220.0 lb

## 2021-02-23 DIAGNOSIS — M25531 Pain in right wrist: Secondary | ICD-10-CM

## 2021-02-23 DIAGNOSIS — M1711 Unilateral primary osteoarthritis, right knee: Secondary | ICD-10-CM | POA: Diagnosis not present

## 2021-02-23 MED ORDER — PREDNISONE 10 MG (21) PO TBPK: ORAL_TABLET | ORAL | 0 refills | Status: DC

## 2021-02-23 NOTE — Progress Notes (Signed)
Office Visit Note   Patient: Douglas Carpenter           Date of Birth: 12-29-1955           MRN: [id number] Visit Date: 02/23/2021              Requested by: [name], MD [address] [place],  [place] [address] PCP: [name], MD   Assessment & Plan: Visit Diagnoses:  1. Pain in right wrist   2. Primary osteoarthritis of right knee     Plan: In regards to the right knee were going to hold off on any injections for now as he is still feeling relief from the previous one.  In regards to the right hand I feel that he may have some nonspecific inflammatory process going on but not sure what is going on exactly.  Recommend immobilization with a brace and a prednisone Dosepak to see if this will resolve things.  He will follow-up with [place] in the next month or 2 for cortisone injection for the knee.  Follow-Up Instructions: Return if symptoms worsen or fail to improve.   Orders:  No orders of the defined types were placed in this encounter.  Meds ordered this encounter  Medications   predniSONE (STERAPRED UNI-PAK 21 TAB) 10 MG (21) TBPK tablet    Sig: Take as directed    Dispense:  21 tablet    Refill:  0      Procedures: No procedures performed   Clinical Data: No additional findings.   Subjective: Chief Complaint  Patient presents with   Right Hand - Pain   Right Knee - Pain    Douglas Carpenter comes in today for follow-up of right thumb CMC arthritis that we injected on 09/22/2020.  He also has new complaint of pain across the backside of his hand that burns.  Denies any numbness.  Denies any recent injuries or neck pain or forearm pain.  In terms of right knee is previous cortisone injection was on 11/27/2020 and he feels that he can probably wait another month before he needs another injection.   Review of Systems  Constitutional: Negative.   All other systems reviewed and are negative.   Objective: Vital Signs: Ht '5\' 7"'$  (1.702 m)   Wt 220 lb (99.8 kg)    BMI 34.46 kg/m   Physical Exam Vitals and nursing note reviewed.  Constitutional:      Appearance: He is well-developed.  Pulmonary:     Effort: Pulmonary effort is normal.  Abdominal:     Palpations: Abdomen is soft.  Skin:    General: Skin is warm.  Neurological:     Mental Status: He is alert and oriented to person, place, and time.  Psychiatric:        Behavior: Behavior normal.        Thought Content: Thought content normal.        Judgment: Judgment normal.    Ortho Exam Examination of the right hand shows he has mild discomfort with grind test.  He has no focal motor or sensory deficits.  He feels burning and pain across the top of the hand and no specific dermatomal pattern.  Radial tunnel is nontender.  No soft tissue crepitus.  There is no pain with passive range of motion of the wrist or fingers.  Right knee exam is unchanged. Specialty Comments:  No specialty comments available.  Imaging: No results found.   PMFS History: Patient  Active Problem List   Diagnosis Date Noted   Pain in right wrist 02/23/2021   Diuretic-induced hypokalemia 10/22/2020   Encounter for general adult medical examination with abnormal findings 08/05/2020   Rising PSA level 08/04/2020   Stage 3a chronic kidney disease ([place]) 08/04/2020   Chronic hyperglycemia 08/03/2020   Primary hypertension 08/03/2020   Benign prostatic hyperplasia without lower urinary tract symptoms 08/03/2020   Hyperlipidemia with target LDL less than 130 08/03/2020   Primary osteoarthritis of left knee 04/16/2019   Primary osteoarthritis of right knee 02/20/2019   Exercise-induced asthma 07/21/2014   Chronic cough 07/21/2014   Upper airway cough syndrome 07/21/2014   Allergic rhinitis 07/21/2014   Lumbar stenosis 11/06/2013   Urethral stricture 01/07/2013   VOCAL CORD DISORDER 02/19/2009   Asthma 01/27/2009   GERD 01/27/2009   Past Medical History:  Diagnosis Date   Allergy    Arthritis     "everywhere"   BPH (benign prostatic hypertrophy)    Chronic rhinitis 01/27/2009   ED (erectile dysfunction)    GERD (gastroesophageal reflux disease)    Headache(784.0)    migraines, as many as 3 in a week     HYPERLIPIDEMIA 01/27/2009   HYPERTENSION 01/27/2009   Mild asthma    seasonal allergies, uses Proair once per yr.    Mild obstructive sleep apnea    per pt -- mild osa test result--  no rx cpap needed   Neuromuscular disorder (HCC)    Sleep apnea    no cpap   SOB (shortness of breath)    Testosterone deficiency    Urethral stricture    Vocal cord anomaly    pt states told from [place]  doctor heard a little gurgle sound -- no farther testing done    Family History  Problem Relation Age of Onset   Cancer Mother        lung   Lung cancer Mother    Heart disease Maternal Grandmother    Heart disease Maternal Grandfather    Cancer Maternal Grandfather        colon, prostate,lung   Colon cancer Neg Hx    Colon polyps Neg Hx    Esophageal cancer Neg Hx    Rectal cancer Neg Hx    Stomach cancer Neg Hx     Past Surgical History:  Procedure Laterality Date   BACK SURGERY     COLONOSCOPY     CYSTOSCOPY WITH URETHRAL DILATATION N/A 01/07/2013   Procedure: CYSTOSCOPY WITH URETHRAL DILATATION BALLOON DILATION ;  Surgeon: Bernestine Amass, MD;  [facility];  Service: Urology;  Laterality: N/A;   LUMBAR LAMINECTOMY/DECOMPRESSION MICRODISCECTOMY Right 11/29/2012   Procedure: Right Lumbar Four to Five Laminectomy/ Decompression Microdiskectomy;  Surgeon: Otilio Connors, MD;  Location: [facility];  Service: Neurosurgery;  Laterality: Right;  LUMBAR LAMINECTOMY/DECOMPRESSION MICRODISCECTOMY 1 LEVEL   POLYPECTOMY     SHOULDER ARTHROSCOPY W/ ACROMIAL REPAIR Left 08-28-2002   TOTAL KNEE ARTHROPLASTY Left 12/02/2019   TOTAL KNEE ARTHROPLASTY Left 12/02/2019   Procedure: LEFT TOTAL KNEE ARTHROPLASTY;  Surgeon: Leandrew Koyanagi, MD;  Location: [facility];  Service: Orthopedics;   Laterality: Left;   UPPER GASTROINTESTINAL ENDOSCOPY     Social History   Occupational History   Occupation: retired  Tobacco Use   Smoking status: Former    Packs/day: 0.50    Years: 22.00    Pack years: 11.00    Types: Cigarettes    Quit date: 06/27/1988  Years since quitting: 32.6   Smokeless tobacco: Never  Vaping Use   Vaping Use: Never used  Substance and Sexual Activity   Alcohol use: Yes    Alcohol/week: 21.0 standard drinks    Types: 21 Shots of liquor per week    Comment: cognac   Drug use: No   Sexual activity: Yes    Partners: Female

## 2021-03-18 ENCOUNTER — Other Ambulatory Visit: Payer: Self-pay

## 2021-03-18 ENCOUNTER — Ambulatory Visit (INDEPENDENT_AMBULATORY_CARE_PROVIDER_SITE_OTHER): Payer: Medicare PPO | Admitting: Internal Medicine

## 2021-03-18 ENCOUNTER — Encounter: Payer: Self-pay | Admitting: Internal Medicine

## 2021-03-18 VITALS — BP 132/84 | HR 96 | Temp 98.5°F | Resp 16 | Ht 67.0 in | Wt 217.0 lb

## 2021-03-18 DIAGNOSIS — E876 Hypokalemia: Secondary | ICD-10-CM | POA: Diagnosis not present

## 2021-03-18 DIAGNOSIS — Z23 Encounter for immunization: Secondary | ICD-10-CM | POA: Diagnosis not present

## 2021-03-18 DIAGNOSIS — R7303 Prediabetes: Secondary | ICD-10-CM | POA: Diagnosis not present

## 2021-03-18 DIAGNOSIS — N1831 Chronic kidney disease, stage 3a: Secondary | ICD-10-CM

## 2021-03-18 DIAGNOSIS — I10 Essential (primary) hypertension: Secondary | ICD-10-CM

## 2021-03-18 DIAGNOSIS — T502X5A Adverse effect of carbonic-anhydrase inhibitors, benzothiadiazides and other diuretics, initial encounter: Secondary | ICD-10-CM

## 2021-03-18 DIAGNOSIS — E118 Type 2 diabetes mellitus with unspecified complications: Secondary | ICD-10-CM

## 2021-03-18 LAB — BASIC METABOLIC PANEL
BUN: 19 mg/dL (ref 6–23)
CO2: 30 mEq/L (ref 19–32)
Calcium: 9.4 mg/dL (ref 8.4–10.5)
Chloride: 98 mEq/L (ref 96–112)
Creatinine, Ser: 1.07 mg/dL (ref 0.40–1.50)
GFR: 72.68 mL/min (ref >60.00)
Glucose, Bld: 114 mg/dL — ABNORMAL HIGH (ref 70–99)
Potassium: 3.3 mEq/L — ABNORMAL LOW (ref 3.5–5.1)
Sodium: 138 mEq/L (ref 135–145)

## 2021-03-18 LAB — HEMOGLOBIN A1C: Hgb A1c MFr Bld: 6.5 % (ref 4.6–6.5)

## 2021-03-18 LAB — MAGNESIUM: Magnesium: 1.9 mg/dL (ref 1.5–2.5)

## 2021-03-18 MED ORDER — POTASSIUM CHLORIDE CRYS ER 20 MEQ PO TBCR: 20.0000 meq | EXTENDED_RELEASE_TABLET | Freq: Three times a day (TID) | ORAL | 0 refills | Status: DC

## 2021-03-18 NOTE — Progress Notes (Signed)
Subjective:  Patient ID: Douglas Carpenter, male    DOB: 1955/09/14  Age: 65 y.o. MRN: [id number]  CC: Hypertension  This visit occurred during the SARS-CoV-2 public health emergency.  Safety protocols were in place, including screening questions prior to the visit, additional usage of staff PPE, and extensive cleaning of exam room while observing appropriate contact time as indicated for disinfecting solutions.    HPI Douglas Carpenter presents for f/up -  He tells me his blood pressure has been well controlled.  He has an unchanged degree of shortness of breath.  He denies chest pain, diaphoresis, dizziness, lightheadedness, or palpitations.   Outpatient Medications Prior to Visit  Medication Sig Dispense Refill   albuterol (PROAIR HFA) 108 (90 Base) MCG/ACT inhaler Inhale 2 puffs into the lungs every 6 (six) hours as needed for wheezing or shortness of breath. 1 Inhaler 3   albuterol (PROVENTIL) (2.5 MG/3ML) 0.083% nebulizer solution Take 3 mLs (2.5 mg total) by nebulization every 6 (six) hours as needed for wheezing or shortness of breath. 360 mL 5   aspirin EC 81 MG tablet Take 1 tablet (81 mg total) by mouth 2 (two) times daily. 84 tablet 0   atorvastatin (LIPITOR) 80 MG tablet Take 1 tablet (80 mg total) by mouth at bedtime. 90 tablet 1   carvedilol (COREG) 12.5 MG tablet Take 1 tablet (12.5 mg total) by mouth 2 (two) times daily. 180 tablet 3   diclofenac Sodium (VOLTAREN) 1 % GEL Apply 2 g topically 4 (four) times daily. 150 g 0   DULoxetine (CYMBALTA) 60 MG capsule Take 1 capsule (60 mg total) by mouth daily. 90 capsule 1   Erenumab-aooe (AIMOVIG) 70 MG/ML SOAJ Inject 70 mg into the skin every 30 (thirty) days.     fluticasone-salmeterol (WIXELA INHUB) 250-50 MCG/ACT AEPB Inhale 1 puff into the lungs in the morning and at bedtime. 180 each 3   gabapentin (NEURONTIN) 600 MG tablet Take 600 mg by mouth 3 (three) times daily.      indapamide (LOZOL) 2.5 MG tablet Take 2.5 mg by mouth daily.      montelukast (SINGULAIR) 10 MG tablet Take 1 tablet (10 mg total) by mouth at bedtime. 30 tablet 11   olopatadine (PATADAY) 0.1 % ophthalmic solution Place 1 drop into both eyes 2 (two) times daily. 5 mL 0   omeprazole (PRILOSEC) 40 MG capsule Take 40 mg by mouth daily.      predniSONE (STERAPRED UNI-PAK 21 TAB) 10 MG (21) TBPK tablet Take as directed 21 tablet 0   potassium chloride SA (KLOR-CON) 20 MEQ tablet Take 1 tablet (20 mEq total) by mouth 2 (two) times daily. 180 tablet 1   loratadine (CLARITIN) 10 MG tablet TAKE ONE TABLET BY MOUTH DAILY AS NEEDED FOR RUNNY NOSE OR SNEEZING     No facility-administered medications prior to visit.    ROS Review of Systems  Constitutional:  Negative for diaphoresis and fatigue.  HENT: Negative.    Respiratory:  Positive for shortness of breath. Negative for cough, chest tightness and wheezing.   Cardiovascular:  Negative for chest pain, palpitations and leg swelling.  Gastrointestinal:  Negative for abdominal pain, constipation, diarrhea, nausea and vomiting.  Genitourinary: Negative.  Negative for difficulty urinating, dysuria and hematuria.  Musculoskeletal: Negative.  Negative for myalgias.  Skin: Negative.  Negative for color change and pallor.  Neurological:  Negative for dizziness, weakness and light-headedness.  Hematological:  Negative for adenopathy. Does not bruise/bleed easily.  Psychiatric/Behavioral:  Negative.     Objective:  BP 132/84 (BP Location: Right Arm, Patient Position: Sitting, Cuff Size: Large)   Pulse 96   Temp 98.5 F (36.9 C) (Oral)   Resp 16   Ht 5\' 7"  (1.702 m)   Wt 217 lb (98.4 kg)   SpO2 97%   BMI 33.99 kg/m   BP Readings from Last 3 Encounters:  03/18/21 132/84  02/02/21 140/72  01/21/21 (!) 146/90    Wt Readings from Last 3 Encounters:  03/18/21 217 lb (98.4 kg)  02/23/21 220 lb (99.8 kg)  02/02/21 220 lb (99.8 kg)    Physical Exam Vitals reviewed.  Constitutional:      Appearance: He is  obese. He is not ill-appearing.  HENT:     Nose: Nose normal.     Mouth/Throat:     Mouth: Mucous membranes are moist.  Eyes:     Conjunctiva/sclera: Conjunctivae normal.  Cardiovascular:     Rate and Rhythm: Normal rate and regular rhythm.     Heart sounds: No murmur heard.   No gallop.  Pulmonary:     Effort: Pulmonary effort is normal.     Breath sounds: No stridor. No wheezing, rhonchi or rales.  Abdominal:     General: Abdomen is protuberant. Bowel sounds are normal. There is no distension.     Palpations: Abdomen is soft. There is no hepatomegaly, splenomegaly or mass.  Musculoskeletal:        General: Normal range of motion.     Cervical back: Neck supple.     Right lower leg: No edema.  Lymphadenopathy:     Cervical: No cervical adenopathy.  Skin:    General: Skin is warm.  Neurological:     General: No focal deficit present.     Mental Status: He is alert.  Psychiatric:        Mood and Affect: Mood normal.        Behavior: Behavior normal.    Lab Results  Component Value Date   WBC 5.9 08/03/2020   HGB 14.0 08/03/2020   HCT 42.0 08/03/2020   PLT 202.0 08/03/2020   GLUCOSE 114 (H) 03/18/2021   CHOL 218 (H) 08/03/2020   TRIG 149.0 08/03/2020   HDL 64.00 08/03/2020   LDLCALC 124 (H) 08/03/2020   ALT 26 08/03/2020   AST 21 08/03/2020   NA 138 03/18/2021   K 3.3 (L) 03/18/2021   CL 98 03/18/2021   CREATININE 1.07 03/18/2021   BUN 19 03/18/2021   CO2 30 03/18/2021   TSH 1.69 08/03/2020   PSA 2.65 11/02/2020   INR 1.0 11/28/2019   HGBA1C 6.5 03/18/2021    ECHOCARDIOGRAM COMPLETE  Result Date: 12/14/2020    ECHOCARDIOGRAM REPORT   Patient Name:   Douglas Carpenter Date of Exam: 12/14/2020 Medical Rec #:  [id number]     Height:       67.0 in Accession #:    [id number]    Weight:       220.6 lb Date of Birth:  [birth year]     BSA:          2.108 m Patient Age:    65 years      BP:           118/68 mmHg Patient Gender: M             HR:           95 bpm. Exam  Location:  Outpatient Procedure: 2D Echo and  3D Echo Indications:    Dyspnea R06.00  History:        Patient has no prior history of Echocardiogram examinations.                 Risk Factors:Hypertension and Dyslipidemia.  Sonographer:    Mikki Santee RDCS (AE) Referring Phys: [id number] [place]  1. Left ventricular ejection fraction, by estimation, is 55 to 60%. The left ventricle has normal function. The left ventricle has no regional wall motion abnormalities. Left ventricular diastolic parameters were normal.  2. Right ventricular systolic function is normal. The right ventricular size is normal.  3. The mitral valve is grossly normal. No evidence of mitral valve regurgitation.  4. The aortic valve is normal in structure. Aortic valve regurgitation is not visualized. FINDINGS  Left Ventricle: Left ventricular ejection fraction, by estimation, is 55 to 60%. The left ventricle has normal function. The left ventricle has no regional wall motion abnormalities. The left ventricular internal cavity size was normal in size. There is  no left ventricular hypertrophy. Left ventricular diastolic parameters were normal. Right Ventricle: The right ventricular size is normal. Right vetricular wall thickness was not well visualized. Right ventricular systolic function is normal. Left Atrium: Left atrial size was normal in size. Right Atrium: Right atrial size was normal in size. Pericardium: There is no evidence of pericardial effusion. Mitral Valve: The mitral valve is grossly normal. No evidence of mitral valve regurgitation. Tricuspid Valve: The tricuspid valve is grossly normal. Tricuspid valve regurgitation is not demonstrated. Aortic Valve: The aortic valve is normal in structure. Aortic valve regurgitation is not visualized. Pulmonic Valve: The pulmonic valve was not well visualized. Pulmonic valve regurgitation is not visualized. Aorta: The aortic root and ascending aorta are structurally normal,  with no evidence of dilitation. IAS/Shunts: The atrial septum is grossly normal.  LEFT VENTRICLE PLAX 2D LVIDd:         4.30 cm  Diastology LVIDs:         2.90 cm  LV e' medial:    6.85 cm/s LV PW:         0.80 cm  LV E/e' medial:  8.1 LV IVS:        1.00 cm  LV e' lateral:   12.30 cm/s LVOT diam:     2.10 cm  LV E/e' lateral: 4.5 LV SV:         59 LV SV Index:   28 LVOT Area:     3.46 cm  RIGHT VENTRICLE RV S prime:     17.70 cm/s TAPSE (M-mode): 2.0 cm LEFT ATRIUM             Index       RIGHT ATRIUM           Index LA diam:        2.80 cm 1.33 cm/m  RA Area:     13.10 cm LA Vol (A2C):   43.8 ml 20.78 ml/m RA Volume:   33.30 ml  15.79 ml/m LA Vol (A4C):   42.3 ml 20.06 ml/m LA Biplane Vol: 47.4 ml 22.48 ml/m  AORTIC VALVE LVOT Vmax:   132.00 cm/s LVOT Vmean:  75.900 cm/s LVOT VTI:    0.170 m  AORTA Ao Root diam: 3.00 cm Ao Asc diam:  2.80 cm MITRAL VALVE MV Area (PHT): 2.91 cm    SHUNTS MV Decel Time: 261 msec    Systemic VTI:  0.17 m MV E velocity: 55.30 cm/s  Systemic Diam: 2.10 cm MV A velocity: 66.90 cm/s MV E/A ratio:  0.83 Mertie Moores MD Electronically signed by Mertie Moores MD Signature Date/Time: 12/14/2020/1:33:22 PM    Final     Assessment & Plan:   Douglas Carpenter was seen today for hypertension.  Diagnoses and all orders for this visit:  Flu vaccine need -     Flu Vaccine QUAD High Dose(Fluad)  Primary hypertension- His blood pressure is adequately well controlled. -     Basic metabolic panel; Future -     Magnesium; Future -     Magnesium -     Basic metabolic panel -     potassium chloride SA (KLOR-CON) 20 MEQ tablet; Take 1 tablet (20 mEq total) by mouth 3 (three) times daily.  Stage 3a chronic kidney disease ([place])- His renal function is normal now. -     Basic metabolic panel; Future -     Basic metabolic panel  Prediabetes -     Hemoglobin A1c; Future -     Hemoglobin A1c  Diuretic-induced hypokalemia- His potassium level remains low.  I will increase the dose of the  potassium supplement. -     Magnesium; Future -     Magnesium -     potassium chloride SA (KLOR-CON) 20 MEQ tablet; Take 1 tablet (20 mEq total) by mouth 3 (three) times daily.  Type II diabetes mellitus with manifestations ([place])- His A1c is up to 6.5%.  Medical therapy is not yet indicated.  I have changed Douglas Carpenter's potassium chloride SA. I am also having him maintain his DULoxetine, albuterol, olopatadine, gabapentin, omeprazole, Aimovig, aspirin EC, montelukast, atorvastatin, diclofenac Sodium, loratadine, albuterol, carvedilol, indapamide, fluticasone-salmeterol, and predniSONE.  Meds ordered this encounter  Medications   potassium chloride SA (KLOR-CON) 20 MEQ tablet    Sig: Take 1 tablet (20 mEq total) by mouth 3 (three) times daily.    Dispense:  270 tablet    Refill:  0      Follow-up: Return in about 6 months (around 09/15/2021).  Scarlette Calico, MD

## 2021-03-18 NOTE — Patient Instructions (Signed)

## 2021-03-19 ENCOUNTER — Ambulatory Visit (INDEPENDENT_AMBULATORY_CARE_PROVIDER_SITE_OTHER): Payer: Medicare PPO | Admitting: Pulmonary Disease

## 2021-03-19 ENCOUNTER — Encounter: Payer: Self-pay | Admitting: Pulmonary Disease

## 2021-03-19 VITALS — BP 122/78 | HR 113 | Temp 98.5°F | Ht 67.0 in | Wt 218.0 lb

## 2021-03-19 DIAGNOSIS — H1013 Acute atopic conjunctivitis, bilateral: Secondary | ICD-10-CM

## 2021-03-19 DIAGNOSIS — J4550 Severe persistent asthma, uncomplicated: Secondary | ICD-10-CM

## 2021-03-19 DIAGNOSIS — J301 Allergic rhinitis due to pollen: Secondary | ICD-10-CM

## 2021-03-19 DIAGNOSIS — M79604 Pain in right leg: Secondary | ICD-10-CM

## 2021-03-19 MED ORDER — INCRUSE ELLIPTA 62.5 MCG/INH IN AEPB: 1.0000 | INHALATION_SPRAY | Freq: Every day | RESPIRATORY_TRACT | 5 refills | Status: DC

## 2021-03-19 MED ORDER — PREDNISONE 10 MG PO TABS: ORAL_TABLET | ORAL | 0 refills | Status: AC

## 2021-03-19 NOTE — Progress Notes (Signed)
[facility]  Chief Complaint  Patient presents with   Follow-up    Patient reports have some shortness of breath on exertion worse in last few weeks.      Constitutional:  BP 122/78 (BP Location: Left Arm, Patient Position: Sitting, Cuff Size: Large)   Pulse (!) 113   Temp 98.5 F (36.9 C) (Oral)   Ht 5\' 7"  (1.702 m)   Wt 218 lb (98.9 kg)   SpO2 96%   BMI 34.14 kg/m   Past Medical History:  Low T, HTN, HLD, HA, GERD, BPH, OA, COVID 19 infection January 2022  Past Surgical History:  He  has a past surgical history that includes Lumbar laminectomy/decompression microdiscectomy (Right, 11/29/2012); Shoulder arthroscopy w/ acromial repair (Left, 08-28-2002); Cystoscopy with urethral dilatation (N/A, 01/07/2013); Colonoscopy; Polypectomy; Upper gastrointestinal endoscopy; Back surgery; Total knee arthroplasty (Left, 12/02/2019); and Total knee arthroplasty (Left, 12/02/2019).  Brief Summary:  Douglas Carpenter is a 65 y.o. male former smoker with chronic cough in setting of Allergic asthma, Post nasal drip, GERD, and vocal cord disease.  He also has hx of snoring.      Subjective:   He is here with his wife.  With season change he has noticed more shortness of breath.  He is feeling tight in his chest.  His wife sees him breathing heavy.  Using his inhalers help, but he has to use them more often now.  No fever, sinus congestion, hemoptysis, or skin rash.  He has been getting numbness and pain in the lateral part of his right thigh.  This gets better if he gets up and shakes his leg some.    He will be seeing orthopedics for hand pain.  Physical Exam:   Appearance - well kempt   ENMT - no sinus tenderness, no oral exudate, no LAN, Mallampati 3 airway, no stridor  Respiratory - equal breath sounds bilaterally, no wheezing or rales  CV - s1s2 regular rate and rhythm, no murmurs  Ext - no clubbing, no edema  Skin - no rashes  Psych -  normal mood and affect    Pulmonary testing:  PFT 02/19/09 >> FEV1 2.90 (90%), FEV1% 70, TLC 5.62(93%), DLCO 99%, +BD PFT 07/21/14 >> FEV1 2.70 (93%), FEV1% 74, TLC 5.97 (91%), RV 2.65 (126%), DLCO 106%, +BD RAST 07/21/14 >> react to dust mites, grasses; IgE 49 FeNO 10/27/15 >> 228  Sleep Tests:  HST 03/07/13 >> AHI 3.9, SaO2 low 80%  Cardiac Tests:  Echo 12/14/20 >> EF 55 to 60%  Social History:  He  reports that he quit smoking about 32 years ago. His smoking use included cigarettes. He has a 11.00 pack-year smoking history. He has never used smokeless tobacco. He reports current alcohol use of about 21.0 standard drinks per week. He reports that he does not use drugs.  Family History:  His family history includes Cancer in his maternal grandfather and mother; Heart disease in his maternal grandfather and maternal grandmother; Lung cancer in his mother.     Assessment/Plan:   Allergic asthma with exercise induced bronchoconstriction. - he gets scripts from the [place] - he has frequent exacerbations, and these happen typical with onset of Fall and Spring - will add incruse to his inhaler regimen - continue wixela and singulair - prn albuterol - will reassess status in couple of months and then decide if he is ready to consider adding a biologic agent  Upper airway cough syndrome. - continue  singulair - prn claritin   Allergic conjunctivitis. - prn pataday  Right thigh pain. - advised that this could be related to lateral femoral cutaneous nerve injury, and he should follow up with PCP and neurosurgery/orthopedics to assess further  Tachycardia. - seen by Dr. Casandra Doffing with [place] [facility]   Time Spent Involved in Patient Care on Day of Examination:  33 minutes  Follow up:   Patient Instructions  Incruse 1 puff daily  Call if you feel you need to use prednisone  You can look up lateral femoral cutaneous nerve injury causing your right thigh pain  Follow up in 2  months  Medication List:   Allergies as of 03/19/2021       Reactions   Aspirin Rash   Pt can tolerate low doses-- INTOLERANT TO HIGHER DOSES        Medication List        Accurate as of March 19, 2021 10:52 AM. If you have any questions, ask your nurse or doctor.          STOP taking these medications    loratadine 10 MG tablet Commonly known as: CLARITIN Stopped by: [name], MD   predniSONE 10 MG (21) Tbpk tablet Commonly known as: STERAPRED UNI-PAK 21 TAB Replaced by: predniSONE 10 MG tablet Stopped by: [name], MD       TAKE these medications    Aimovig 70 MG/ML Soaj Generic drug: Erenumab-aooe Inject 70 mg into the skin every 30 (thirty) days.   albuterol 108 (90 Base) MCG/ACT inhaler Commonly known as: ProAir HFA Inhale 2 puffs into the lungs every 6 (six) hours as needed for wheezing or shortness of breath.   albuterol (2.5 MG/3ML) 0.083% nebulizer solution Commonly known as: PROVENTIL Take 3 mLs (2.5 mg total) by nebulization every 6 (six) hours as needed for wheezing or shortness of breath.   aspirin EC 81 MG tablet Take 1 tablet (81 mg total) by mouth 2 (two) times daily.   atorvastatin 80 MG tablet Commonly known as: LIPITOR Take 1 tablet (80 mg total) by mouth at bedtime.   carvedilol 12.5 MG tablet Commonly known as: COREG Take 1 tablet (12.5 mg total) by mouth 2 (two) times daily.   diclofenac Sodium 1 % Gel Commonly known as: Voltaren Apply 2 g topically 4 (four) times daily.   DULoxetine 60 MG capsule Commonly known as: Cymbalta Take 1 capsule (60 mg total) by mouth daily.   fluticasone-salmeterol 250-50 MCG/ACT Aepb Commonly known as: Wixela Inhub Inhale 1 puff into the lungs in the morning and at bedtime.   gabapentin 600 MG tablet Commonly known as: NEURONTIN Take 600 mg by mouth 3 (three) times daily.   Incruse Ellipta 62.5 MCG/INH Aepb Generic drug: umeclidinium bromide Inhale 1 puff into the lungs  daily. Started by: [name], MD   indapamide 2.5 MG tablet Commonly known as: LOZOL Take 2.5 mg by mouth daily.   montelukast 10 MG tablet Commonly known as: SINGULAIR Take 1 tablet (10 mg total) by mouth at bedtime.   olopatadine 0.1 % ophthalmic solution Commonly known as: Pataday Place 1 drop into both eyes 2 (two) times daily.   omeprazole 40 MG capsule Commonly known as: PRILOSEC Take 40 mg by mouth daily.   potassium chloride SA 20 MEQ tablet Commonly known as: KLOR-CON Take 1 tablet (20 mEq total) by mouth 3 (three) times daily.   predniSONE 10 MG tablet Commonly known as: DELTASONE Take 3 tablets (30 mg  total) by mouth daily with breakfast for 2 days, THEN 2 tablets (20 mg total) daily with breakfast for 2 days, THEN 1 tablet (10 mg total) daily with breakfast for 2 days. Start taking on: March 19, 2021 Replaces: predniSONE 10 MG (21) Tbpk tablet Started by: [name], MD        Signature:  [name], MD [place] Pager - [phone] 03/19/2021, 10:52 AM

## 2021-03-19 NOTE — Patient Instructions (Addendum)
Incruse 1 puff daily  Call if you feel you need to use prednisone  You can look up lateral femoral cutaneous nerve injury causing your right thigh pain  Follow up in 2 months

## 2021-03-24 ENCOUNTER — Other Ambulatory Visit: Payer: Self-pay

## 2021-03-24 ENCOUNTER — Encounter: Payer: Self-pay | Admitting: Orthopaedic Surgery

## 2021-03-24 ENCOUNTER — Ambulatory Visit (INDEPENDENT_AMBULATORY_CARE_PROVIDER_SITE_OTHER): Payer: Medicare PPO | Admitting: Orthopaedic Surgery

## 2021-03-24 DIAGNOSIS — M1711 Unilateral primary osteoarthritis, right knee: Secondary | ICD-10-CM

## 2021-03-24 MED ORDER — METHYLPREDNISOLONE ACETATE 40 MG/ML IJ SUSP
40.0000 mg | INTRAMUSCULAR | Status: AC | PRN
  Administered 2021-03-24: 40 mg via INTRA_ARTICULAR

## 2021-03-24 MED ORDER — LIDOCAINE HCL 1 % IJ SOLN
2.0000 mL | INTRAMUSCULAR | Status: AC | PRN
  Administered 2021-03-24: 2 mL

## 2021-03-24 MED ORDER — BUPIVACAINE HCL 0.5 % IJ SOLN
2.0000 mL | INTRAMUSCULAR | Status: AC | PRN
  Administered 2021-03-24: 2 mL via INTRA_ARTICULAR

## 2021-03-24 NOTE — Progress Notes (Signed)
Office Visit Note   Patient: Douglas Carpenter           Date of Birth: 12-15-1955           MRN: [id number] Visit Date: 03/24/2021              Requested by: [name], MD [address] [place],  [place] [address] PCP: [name], MD   Assessment & Plan: Visit Diagnoses:  1. Primary osteoarthritis of right knee     Plan: Impression is right knee delay.  He continues to get relief from cortisone injections therefore we repeated this today.  He will make an appointment in the near future to see me about his hand pain.  Follow-Up Instructions: No follow-ups on file.   Orders:  No orders of the defined types were placed in this encounter.  No orders of the defined types were placed in this encounter.     Procedures: Large Joint Inj: R knee on 03/24/2021 10:19 AM Indications: pain Details: 22 G needle  Arthrogram: No  Medications: 40 mg methylPREDNISolone acetate 40 MG/ML; 2 mL lidocaine 1 %; 2 mL bupivacaine 0.5 % Consent was given by the patient. Patient was prepped and draped in the usual sterile fashion.      Clinical Data: No additional findings.   Subjective: Chief Complaint  Patient presents with  . Right Knee - Follow-up    HPI  [place] returns today for follow-up of right knee OA and DJD.  Requesting another cortisone injection the pain has returned.  He has had good relief from prior cortisone injections.  Review of Systems   Objective: Vital Signs: There were no vitals taken for this visit.  Physical Exam  Ortho Exam  Right knee shows no joint effusion.  Patellofemoral crepitus with range of motion.  Exam is otherwise unchanged.  Specialty Comments:  No specialty comments available.  Imaging: No results found.   PMFS History: Patient Active Problem List   Diagnosis Date Noted  . Type II diabetes mellitus with manifestations ([place]) 03/18/2021  . Diuretic-induced hypokalemia 10/22/2020  . Encounter for general adult medical  examination with abnormal findings 08/05/2020  . Rising PSA level 08/04/2020  . Stage 3a chronic kidney disease ([place]) 08/04/2020  . Prediabetes 08/03/2020  . Primary hypertension 08/03/2020  . Benign prostatic hyperplasia without lower urinary tract symptoms 08/03/2020  . Hyperlipidemia with target LDL less than 130 08/03/2020  . Primary osteoarthritis of left knee 04/16/2019  . Primary osteoarthritis of right knee 02/20/2019  . Exercise-induced asthma 07/21/2014  . Chronic cough 07/21/2014  . Upper airway cough syndrome 07/21/2014  . Allergic rhinitis 07/21/2014  . Lumbar stenosis 11/06/2013  . Urethral stricture 01/07/2013  . VOCAL CORD DISORDER 02/19/2009  . Asthma 01/27/2009  . GERD 01/27/2009   Past Medical History:  Diagnosis Date  . Allergy   . Arthritis    "everywhere"  . BPH (benign prostatic hypertrophy)   . Chronic rhinitis 01/27/2009  . ED (erectile dysfunction)   . GERD (gastroesophageal reflux disease)   . Headache(784.0)    migraines, as many as 3 in a week    . HYPERLIPIDEMIA 01/27/2009  . HYPERTENSION 01/27/2009  . Mild asthma    seasonal allergies, uses Proair once per yr.   . Mild obstructive sleep apnea    per pt -- mild osa test result--  no rx cpap needed  . Neuromuscular disorder ([place])   . Sleep apnea    no cpap  .  SOB (shortness of breath)   . Testosterone deficiency   . Urethral stricture   . Vocal cord anomaly    pt states told from [place]  doctor heard a little gurgle sound -- no farther testing done    Family History  Problem Relation Age of Onset  . Cancer Mother        lung  . Lung cancer Mother   . Heart disease Maternal Grandmother   . Heart disease Maternal Grandfather   . Cancer Maternal Grandfather        colon, prostate,lung  . Colon cancer Neg Hx   . Colon polyps Neg Hx   . Esophageal cancer Neg Hx   . Rectal cancer Neg Hx   . Stomach cancer Neg Hx     Past Surgical History:  Procedure Laterality Date  . BACK SURGERY    .  COLONOSCOPY    . CYSTOSCOPY WITH URETHRAL DILATATION N/A 01/07/2013   Procedure: CYSTOSCOPY WITH URETHRAL DILATATION BALLOON DILATION ;  Surgeon: Bernestine Amass, MD;  Location[facility];  Service: Urology;  Laterality: N/A;  . LUMBAR LAMINECTOMY/DECOMPRESSION MICRODISCECTOMY Right 11/29/2012   Procedure: Right Lumbar Four to Five Laminectomy/ Decompression Microdiskectomy;  Surgeon: Otilio Connors, MD;  Location: [facility];  Service: Neurosurgery;  Laterality: Right;  LUMBAR LAMINECTOMY/DECOMPRESSION MICRODISCECTOMY 1 LEVEL  . POLYPECTOMY    . SHOULDER ARTHROSCOPY W/ ACROMIAL REPAIR Left 08-28-2002  . TOTAL KNEE ARTHROPLASTY Left 12/02/2019  . TOTAL KNEE ARTHROPLASTY Left 12/02/2019   Procedure: LEFT TOTAL KNEE ARTHROPLASTY;  Surgeon: Leandrew Koyanagi, MD;  Location: [facility];  Service: Orthopedics;  Laterality: Left;  . UPPER GASTROINTESTINAL ENDOSCOPY     Social History   Occupational History  . Occupation: retired  Tobacco Use  . Smoking status: Former    Packs/day: 0.50    Years: 22.00    Pack years: 11.00    Types: Cigarettes    Quit date: 06/27/1988    Years since quitting: 32.7  . Smokeless tobacco: Never  Vaping Use  . Vaping Use: Never used  Substance and Sexual Activity  . Alcohol use: Yes    Alcohol/week: 21.0 standard drinks    Types: 21 Shots of liquor per week    Comment: cognac  . Drug use: No  . Sexual activity: Yes    Partners: Female

## 2021-03-29 DIAGNOSIS — M461 Sacroiliitis, not elsewhere classified: Secondary | ICD-10-CM | POA: Diagnosis not present

## 2021-03-31 ENCOUNTER — Other Ambulatory Visit: Payer: Self-pay

## 2021-03-31 ENCOUNTER — Ambulatory Visit: Payer: Self-pay

## 2021-03-31 ENCOUNTER — Ambulatory Visit (INDEPENDENT_AMBULATORY_CARE_PROVIDER_SITE_OTHER): Payer: Medicare PPO | Admitting: Orthopaedic Surgery

## 2021-03-31 DIAGNOSIS — M79642 Pain in left hand: Secondary | ICD-10-CM | POA: Diagnosis not present

## 2021-03-31 NOTE — Progress Notes (Signed)
Office Visit Note   Patient: Douglas Carpenter           Date of Birth: [birth year]           MRN: [id number] Visit Date: 03/31/2021              Requested by: [name], MD [address] [place],  [place] [address] PCP: [name], MD   Assessment & Plan: Visit Diagnoses:  1. Pain in left hand     Plan: Impression is continued right hand pain primarily to the [facility] joint concerning for CMC arthritis.  We have discussed having this injection repeated under fluoroscopy versus referring him to a hand specialist.  He is aware that we have a new hand surgeon in the office and would like to be referred to him.  He will follow-up with [place] as needed.  Call with concerns or questions.  Follow-Up Instructions: Return if symptoms worsen or fail to improve.   Orders:  Orders Placed This Encounter  Procedures   XR Hand Complete Left   No orders of the defined types were placed in this encounter.     Procedures: No procedures performed   Clinical Data: No additional findings.   Subjective: Chief Complaint  Patient presents with   Right Hand - Follow-up   Left Hand - Follow-up    HPI Douglas Carpenter is a pleasant 65 year old right-hand-dominant gentleman who comes in today with bilateral hand pain right greater than left.  He was seen in our office about 6 months ago with right hand pain located at the first [facility] joint.  This was injected with cortisone and he was provided with a thumb spica splint.  He denies any relief following the injection.  He has continued pain to the first [facility] joint worse with opening jars.  He is now additionally having pain between the index and long fingers which radiates up the dorsum of the hand into the wrist.  The pain to this area is worse with wrist extension.  He has tried oral and topical anti-inflammatories without relief of symptoms.  He denies any paresthesias to the right hand.  In regards to the left hand, he has diffuse and occasional pain and  swelling.  Seems to be worse in the morning when opening and closing his hand.  Review of Systems as detailed in HPI.  All others reviewed and are negative.   Objective: Vital Signs: There were no vitals taken for this visit.  Physical Exam well-developed and well-nourished gentleman in no acute distress.  Alert and oriented x3.  Ortho Exam right hand exam reveals moderate tenderness to the first [facility] joint.  He has pain but no grinding with grind test.  No pain to the first dorsal compartment.  Negative Finkelstein.  He has increased pain with wrist extension.  Negative Phalen and negative Tinel.  He is neurovascularly intact distally.  Specialty Comments:  No specialty comments available.  Imaging: XR Hand Complete Left  Result Date: 03/31/2021 Mild degenerative changes throughout the entire hand    PMFS History: Patient Active Problem List   Diagnosis Date Noted   Type II diabetes mellitus with manifestations ([place]) 03/18/2021   Diuretic-induced hypokalemia 10/22/2020   Encounter for general adult medical examination with abnormal findings 08/05/2020   Rising PSA level 08/04/2020   Stage 3a chronic kidney disease ([place]) 08/04/2020   Prediabetes 08/03/2020   Primary hypertension 08/03/2020   Benign prostatic hyperplasia without lower urinary tract symptoms 08/03/2020  Hyperlipidemia with target LDL less than 130 08/03/2020   Primary osteoarthritis of left knee 04/16/2019   Primary osteoarthritis of right knee 02/20/2019   Exercise-induced asthma 07/21/2014   Chronic cough 07/21/2014   Upper airway cough syndrome 07/21/2014   Allergic rhinitis 07/21/2014   Lumbar stenosis 11/06/2013   Urethral stricture 01/07/2013   VOCAL CORD DISORDER 02/19/2009   Asthma 01/27/2009   GERD 01/27/2009   Past Medical History:  Diagnosis Date   Allergy    Arthritis    "everywhere"   BPH (benign prostatic hypertrophy)    Chronic rhinitis 01/27/2009   ED (erectile dysfunction)    GERD  (gastroesophageal reflux disease)    Headache(784.0)    migraines, as many as 3 in a week     HYPERLIPIDEMIA 01/27/2009   HYPERTENSION 01/27/2009   Mild asthma    seasonal allergies, uses Proair once per yr.    Mild obstructive sleep apnea    per pt -- mild osa test result--  no rx cpap needed   Neuromuscular disorder ([facility])    Sleep apnea    no cpap   SOB (shortness of breath)    Testosterone deficiency    Urethral stricture    Vocal cord anomaly    pt states told from [place]  doctor heard a little gurgle sound -- no farther testing done    Family History  Problem Relation Age of Onset   Cancer Mother        lung   Lung cancer Mother    Heart disease Maternal Grandmother    Heart disease Maternal Grandfather    Cancer Maternal Grandfather        colon, prostate,lung   Colon cancer Neg Hx    Colon polyps Neg Hx    Esophageal cancer Neg Hx    Rectal cancer Neg Hx    Stomach cancer Neg Hx     Past Surgical History:  Procedure Laterality Date   BACK SURGERY     COLONOSCOPY     CYSTOSCOPY WITH URETHRAL DILATATION N/A 01/07/2013   Procedure: CYSTOSCOPY WITH URETHRAL DILATATION BALLOON DILATION ;  Surgeon: Bernestine Amass, MD;  Location: [facility];  Service: Urology;  Laterality: N/A;   LUMBAR LAMINECTOMY/DECOMPRESSION MICRODISCECTOMY Right 11/29/2012   Procedure: Right Lumbar Four to Five Laminectomy/ Decompression Microdiskectomy;  Surgeon: Otilio Connors, MD;  Location: [place] [facility];  Service: Neurosurgery;  Laterality: Right;  LUMBAR LAMINECTOMY/DECOMPRESSION MICRODISCECTOMY 1 LEVEL   POLYPECTOMY     SHOULDER ARTHROSCOPY W/ ACROMIAL REPAIR Left 08-28-2002   TOTAL KNEE ARTHROPLASTY Left 12/02/2019   TOTAL KNEE ARTHROPLASTY Left 12/02/2019   Procedure: LEFT TOTAL KNEE ARTHROPLASTY;  Surgeon: Leandrew Koyanagi, MD;  Location: [place];  Service: Orthopedics;  Laterality: Left;   UPPER GASTROINTESTINAL ENDOSCOPY     Social History   Occupational History   Occupation:  retired  Tobacco Use   Smoking status: Former    Packs/day: 0.50    Years: 22.00    Pack years: 11.00    Types: Cigarettes    Quit date: 06/27/1988    Years since quitting: 32.7   Smokeless tobacco: Never  Vaping Use   Vaping Use: Never used  Substance and Sexual Activity   Alcohol use: Yes    Alcohol/week: 21.0 standard drinks    Types: 21 Shots of liquor per week    Comment: cognac   Drug use: No   Sexual activity: Yes    Partners: Female

## 2021-04-05 ENCOUNTER — Telehealth: Payer: Self-pay | Admitting: Physical Medicine and Rehabilitation

## 2021-04-05 ENCOUNTER — Ambulatory Visit (INDEPENDENT_AMBULATORY_CARE_PROVIDER_SITE_OTHER): Payer: Medicare PPO | Admitting: Orthopedic Surgery

## 2021-04-05 ENCOUNTER — Ambulatory Visit (INDEPENDENT_AMBULATORY_CARE_PROVIDER_SITE_OTHER): Payer: Medicare PPO

## 2021-04-05 ENCOUNTER — Other Ambulatory Visit: Payer: Self-pay

## 2021-04-05 ENCOUNTER — Encounter: Payer: Self-pay | Admitting: Orthopedic Surgery

## 2021-04-05 VITALS — BP 146/93 | HR 76 | Ht 67.0 in | Wt 218.0 lb

## 2021-04-05 DIAGNOSIS — R200 Anesthesia of skin: Secondary | ICD-10-CM

## 2021-04-05 DIAGNOSIS — M1811 Unilateral primary osteoarthritis of first carpometacarpal joint, right hand: Secondary | ICD-10-CM

## 2021-04-05 DIAGNOSIS — M79641 Pain in right hand: Secondary | ICD-10-CM

## 2021-04-05 NOTE — Addendum Note (Signed)
Addended by: Sherilyn Cooter on: 04/05/2021 10:27 AM   Modules accepted: Orders

## 2021-04-05 NOTE — Progress Notes (Signed)
Office Visit Note   Patient: Douglas Carpenter           Date of Birth: 11/21/1955           MRN: [id number] Visit Date: 04/05/2021              Requested by: Douglas Lima, MD [address] [place] [address],  [place] [address] PCP: Douglas Lima, MD   Assessment & Plan: Visit Diagnoses:  1. Pain in right hand   2. Arthritis of carpometacarpal (CMC) joint of right thumb   3. Bilateral hand numbness     Plan: Discussed with patient that he has several issues going on. He has arthritis involving the index and middle finger MP joints.  He also has arthritis involving the thumb CMC joint.  He describes numbness and tingling in the index, middle, and ring fingers of both hands that wakes him almost every night.  This is most consistent with carpal tunnel syndrome.  He has tried OTC bracing, PO nsaids, topical voltaren gel, and CSI for his thumb OA.  The braces were not comfortable and he didn't wear them.  We will send him to hand therapy to have a custom, hand-based thumb CMC orthosis made.  We will also send him for a NCS/EMG of BUE to evaluate potential CTS.  He can follow up with me in 6-8 weeks after some time with the brace.   Follow-Up Instructions: No follow-ups on file.   Orders:  Orders Placed This Encounter  Procedures   XR Hand Complete Right   No orders of the defined types were placed in this encounter.     Procedures: No procedures performed   Clinical Data: No additional findings.   Subjective: Chief Complaint  Patient presents with   Right Hand - Pain    RIGHT Hand Dom, Right hand worse than Left, gets a burning sensation between, 2nd and 3rd digits and it goes up into the wrist, then when he stretches his hand out he gets a pulling sensation in the palm and wrist area, gets shooting pains with gripping at times and he has to let go of whatever he is holding, constantly holding it due to the pain.   Left Hand - Pain    Similar symptoms to the right but not as  severe.    This is a 65 yo RHD M who presents with R hand pain.  His pain is multifocal and involves the index and middle finger MP joints and the base of his thumb.  The pain can be as bad as 8-9/10 at worst.  He also describes numbness and tingling involving the index, middle, and ring fingers that wakes him from sleep nearly every night.  This has been going on for years.  He has numbness and tingling in his fingers when he's driving his RV.  He denies wrist trauma.  He denies radiculopathic symptoms.  For the thumb pain, he has tried OTC braces but they have been uncomfortable and he hasn't really worn them.  He was previously taking PO diclofenac but has since stopped.  He previously had a CSI into the base of his thumb w/ mild symptom relief.     Review of Systems  Constitutional: Negative.   Respiratory: Negative.    Cardiovascular: Negative.   Musculoskeletal:  Positive for joint swelling.  Skin: Negative.   Neurological:  Positive for numbness.    Objective: Vital Signs: BP (!) 146/93 (BP Location: Left Arm, Patient  Position: Sitting)   Pulse 76   Ht 5\' 7"  (1.702 m)   Wt 218 lb (98.9 kg)   BMI 34.14 kg/m   Physical Exam Constitutional:      Appearance: Normal appearance.  Cardiovascular:     Rate and Rhythm: Normal rate.     Pulses: Normal pulses.  Pulmonary:     Effort: Pulmonary effort is normal.  Skin:    General: Skin is warm and dry.     Capillary Refill: Capillary refill takes less than 2 seconds.  Neurological:     Mental Status: He is alert.    Right Hand Exam   Tenderness  Right hand tenderness location: TTP at base of thumb at [facility].  Range of Motion  The patient has normal right wrist ROM.   Muscle Strength  The patient has normal right wrist strength.  Other  Sensation: normal Pulse: present  Comments:  + CMC grind test.  No MP hyperextension.  No thumb palmar abduction contracture.  Full ROM of index and middle finger ROM but w/ mild crepitus.   + Tinel sign w/ electrical symptoms into middle and ring fingers.  Equivocal CT compression test.  No thenar atrophy.      Specialty Comments:  No specialty comments available.  Imaging: 3V of the R hand taken today are reviewed and interpreted by me.  They demonstrate osteoarthritis involving the thumb CMC joint w/ minimal changes at the STT joint.  He has mild OA of the index and middle finger MP joints w/ maintained joint space but w/ volar osteophytes of MC heads.    PMFS History: Patient Active Problem List   Diagnosis Date Noted   Arthritis of carpometacarpal [facility]) joint of right thumb 04/05/2021   Bilateral hand numbness 04/05/2021   Type II diabetes mellitus with manifestations ([place]) 03/18/2021   Diuretic-induced hypokalemia 10/22/2020   Encounter for general adult medical examination with abnormal findings 08/05/2020   Rising PSA level 08/04/2020   Stage 3a chronic kidney disease ([place]) 08/04/2020   Prediabetes 08/03/2020   Primary hypertension 08/03/2020   Benign prostatic hyperplasia without lower urinary tract symptoms 08/03/2020   Hyperlipidemia with target LDL less than 130 08/03/2020   Primary osteoarthritis of left knee 04/16/2019   Primary osteoarthritis of right knee 02/20/2019   Exercise-induced asthma 07/21/2014   Chronic cough 07/21/2014   Upper airway cough syndrome 07/21/2014   Allergic rhinitis 07/21/2014   Lumbar stenosis 11/06/2013   Urethral stricture 01/07/2013   VOCAL CORD DISORDER 02/19/2009   Asthma 01/27/2009   GERD 01/27/2009   Past Medical History:  Diagnosis Date   Allergy    Arthritis    "everywhere"   BPH (benign prostatic hypertrophy)    Chronic rhinitis 01/27/2009   ED (erectile dysfunction)    GERD (gastroesophageal reflux disease)    Headache(784.0)    migraines, as many as 3 in a week     HYPERLIPIDEMIA 01/27/2009   HYPERTENSION 01/27/2009   Mild asthma    seasonal allergies, uses Proair once per yr.    Mild obstructive  sleep apnea    per pt -- mild osa test result--  no rx cpap needed   Neuromuscular disorder (HCC)    Sleep apnea    no cpap   SOB (shortness of breath)    Testosterone deficiency    Urethral stricture    Vocal cord anomaly    pt states told from [place]  doctor heard a little gurgle sound -- no farther testing done  Family History  Problem Relation Age of Onset   Cancer Mother        lung   Lung cancer Mother    Heart disease Maternal Grandmother    Heart disease Maternal Grandfather    Cancer Maternal Grandfather        colon, prostate,lung   Colon cancer Neg Hx    Colon polyps Neg Hx    Esophageal cancer Neg Hx    Rectal cancer Neg Hx    Stomach cancer Neg Hx     Past Surgical History:  Procedure Laterality Date   BACK SURGERY     COLONOSCOPY     CYSTOSCOPY WITH URETHRAL DILATATION N/A 01/07/2013   Procedure: CYSTOSCOPY WITH URETHRAL DILATATION BALLOON DILATION ;  Surgeon: Bernestine Amass, MD;  Location: [facility];  Service: Urology;  Laterality: N/A;   LUMBAR LAMINECTOMY/DECOMPRESSION MICRODISCECTOMY Right 11/29/2012   Procedure: Right Lumbar Four to Five Laminectomy/ Decompression Microdiskectomy;  Surgeon: Otilio Connors, MD;  Location: [facility];  Service: Neurosurgery;  Laterality: Right;  LUMBAR LAMINECTOMY/DECOMPRESSION MICRODISCECTOMY 1 LEVEL   POLYPECTOMY     SHOULDER ARTHROSCOPY W/ ACROMIAL REPAIR Left 08-28-2002   TOTAL KNEE ARTHROPLASTY Left 12/02/2019   TOTAL KNEE ARTHROPLASTY Left 12/02/2019   Procedure: LEFT TOTAL KNEE ARTHROPLASTY;  Surgeon: Leandrew Koyanagi, MD;  Location: [facility];  Service: Orthopedics;  Laterality: Left;   UPPER GASTROINTESTINAL ENDOSCOPY     Social History   Occupational History   Occupation: retired  Tobacco Use   Smoking status: Former    Packs/day: 0.50    Years: 22.00    Pack years: 11.00    Types: Cigarettes    Quit date: 06/27/1988    Years since quitting: 32.7   Smokeless tobacco: Never  Vaping Use   Vaping Use:  Never used  Substance and Sexual Activity   Alcohol use: Yes    Alcohol/week: 21.0 standard drinks    Types: 21 Shots of liquor per week    Comment: cognac   Drug use: No   Sexual activity: Yes    Partners: Female

## 2021-04-05 NOTE — Telephone Encounter (Signed)
Pt wold like to scheduling his nerve conduction study.   CB [phone]

## 2021-04-08 ENCOUNTER — Encounter: Payer: Self-pay | Admitting: Occupational Therapy

## 2021-04-08 ENCOUNTER — Ambulatory Visit (INDEPENDENT_AMBULATORY_CARE_PROVIDER_SITE_OTHER): Payer: Medicare PPO | Admitting: Occupational Therapy

## 2021-04-08 ENCOUNTER — Other Ambulatory Visit: Payer: Self-pay

## 2021-04-08 DIAGNOSIS — M79641 Pain in right hand: Secondary | ICD-10-CM | POA: Diagnosis not present

## 2021-04-08 DIAGNOSIS — M25641 Stiffness of right hand, not elsewhere classified: Secondary | ICD-10-CM

## 2021-04-08 DIAGNOSIS — M6281 Muscle weakness (generalized): Secondary | ICD-10-CM | POA: Diagnosis not present

## 2021-04-08 DIAGNOSIS — M25541 Pain in joints of right hand: Secondary | ICD-10-CM | POA: Diagnosis not present

## 2021-04-08 DIAGNOSIS — R601 Generalized edema: Secondary | ICD-10-CM

## 2021-04-08 DIAGNOSIS — R278 Other lack of coordination: Secondary | ICD-10-CM

## 2021-04-08 NOTE — Therapy (Signed)
[facility] [address] [place], [place], [address] Phone: [phone]   Fax:  [phone]  Occupational Therapy Evaluation  Patient Details  Name: Douglas Carpenter MRN: [id number] Date of Birth: 1955/12/24 Referring Provider (OT): Dr Tempie Donning   Encounter Date: 04/08/2021   OT End of Session - 04/08/21 1413     Visit Number 1    Number of Visits 3    Date for OT Re-Evaluation 05/06/21    Authorization Type Primary - [employer]. Secondary - [employer] PPO    Progress Note Due on Visit 10   Every 10 visits   OT Start Time 1253    OT Stop Time 1355    OT Time Calculation (min) 62 min    Activity Tolerance Patient tolerated treatment well;No increased pain    Behavior During Therapy WFL for tasks assessed/performed             Past Medical History:  Diagnosis Date   Allergy    Arthritis    "everywhere"   BPH (benign prostatic hypertrophy)    Chronic rhinitis 01/27/2009   ED (erectile dysfunction)    GERD (gastroesophageal reflux disease)    Headache(784.0)    migraines, as many as 3 in a week     HYPERLIPIDEMIA 01/27/2009   HYPERTENSION 01/27/2009   Mild asthma    seasonal allergies, uses Proair once per yr.    Mild obstructive sleep apnea    per pt -- mild osa test result--  no rx cpap needed   Neuromuscular disorder (HCC)    Sleep apnea    no cpap   SOB (shortness of breath)    Testosterone deficiency    Urethral stricture    Vocal cord anomaly    pt states told from [place]  doctor heard a little gurgle sound -- no farther testing done    Past Surgical History:  Procedure Laterality Date   BACK SURGERY     COLONOSCOPY     CYSTOSCOPY WITH URETHRAL DILATATION N/A 01/07/2013   Procedure: CYSTOSCOPY WITH URETHRAL DILATATION BALLOON DILATION ;  Surgeon: Bernestine Amass, MD;  Location: [facility];  Service: Urology;  Laterality: N/A;   LUMBAR LAMINECTOMY/DECOMPRESSION MICRODISCECTOMY Right 11/29/2012    Procedure: Right Lumbar Four to Five Laminectomy/ Decompression Microdiskectomy;  Surgeon: Otilio Connors, MD;  Location: [place];  Service: Neurosurgery;  Laterality: Right;  LUMBAR LAMINECTOMY/DECOMPRESSION MICRODISCECTOMY 1 LEVEL   POLYPECTOMY     SHOULDER ARTHROSCOPY W/ ACROMIAL REPAIR Left 08-28-2002   TOTAL KNEE ARTHROPLASTY Left 12/02/2019   TOTAL KNEE ARTHROPLASTY Left 12/02/2019   Procedure: LEFT TOTAL KNEE ARTHROPLASTY;  Surgeon: Leandrew Koyanagi, MD;  Location: [facility];  Service: Orthopedics;  Laterality: Left;   UPPER GASTROINTESTINAL ENDOSCOPY      There were no vitals filed for this visit.   Subjective Assessment - 04/08/21 1258     Subjective  Pt has h/o  CMC Arthritis R hand > left. Pt also reports numbness in fingers of bilateral hands digits 1-2 that wakes him at noc.    Patient is accompanied by: Family member   [name] - Wife   Pertinent History Asthma, Arthritis, type II diabetes, CMC arthritis R, Allergies, HTN, hyperlipidema. Refer to chart for complete PMH    Patient Stated Goals Decreased pain in right hand/hands    Currently in Pain? Yes    Pain Score 8     Pain Location Hand    Pain Orientation Right  Pain Descriptors / Indicators Aching;Shooting;Tingling;Tightness    Pain Type Chronic pain    Pain Radiating Towards Fingers in right hand digits 1-3    Pain Onset More than a month ago   Close to 1 year or more   Pain Frequency Constant    Aggravating Factors  Functional use, and grip    Pain Relieving Factors Massage, ace bandages for compression.    Multiple Pain Sites No               [facility] OT Assessment - 04/08/21 0001       Assessment   Medical Diagnosis R CMC arthritis thumb   Suspected CTS bilaterally   Referring Provider (OT) Dr Tempie Donning    Onset Date/Surgical Date --   Greater than 1 year ago   Hand Dominance Right    Next MD Visit 6-8 weeks      Precautions   Precautions None   Pt states that he has worn braces previously w/o success due  to poor fit and pain     Restrictions   Weight Bearing Restrictions No      Balance Screen   Has the patient fallen in the past 6 months Yes    How many times? 1    Has the patient had a decrease in activity level because of a fear of falling?  No    Is the patient reluctant to leave their home because of a fear of falling?  No      Home  Environment   Family/patient expects to be discharged to: Private residence    Living Arrangements Spouse/significant other    Lives With Spouse   Wife [name]     Prior Function   Level of Independence Needs assistance with ADLs    Comments Spouse assists PRN, donning socks, etc      ADL   ADL comments Pt wife assists PRN for daily activity - donning LB/socks etc      Mobility   Mobility Status --   Pt reports fall in the past 6 months "stumble"     Written Expression   Dominant Hand Right      Vision - History   Baseline Vision Wears glasses only for reading      Cognition   Overall Cognitive Status Within Functional Limits for tasks assessed      Observation/Other Assessments   Observations Min edema noted right thumb/hand      Sensation   Light Touch Appears Intact    Semmes Weinstein Monofilament Scale Normal   Digits 1-5, however, pt reports numbness digits 1-5 at baseline     Coordination   Gross Motor Movements are Fluid and Coordinated Yes    Fine Motor Movements are Fluid and Coordinated No   Stiffness of fingers right hand   Other opposition to radial side of the small finger only. Unable to perform tip to tip prehension      Edema   Edema Min edema noted right hand when compared visually to left hand      Hand Function   Right Hand Gross Grasp Impaired   Pt able to peroform grasp but c/o stiffness of fingers at end range and terminal flexion.   Right Hand Grip (lbs) 16.4    Right Hand Lateral Pinch 2 lbs    Right Hand 3 Point Pinch 2 lbs    Left Hand Grip (lbs) 75.3    Left Hand Lateral Pinch 11 lbs    Left  3 point  pinch 12 lbs    Comment Tinels at R carpal tunnel = + ; + Phalens right, C/o pulling or tightness but no pain with resisted EPL/APB testing/1st dorsal compartment. He is TTP Right CMC, AROM of thumb is WFL's. He is w/o thenar atrophy noted.              OT Treatments/Exercises (OP) - 04/08/21 0001       ADLs   ADL Comments Pt was educated in splinting use, care and precautions righe hand. He was educated to use during functional activity as it should provide support and stability to his right [facility] joint of his thumb. Examples of daily activities were reviewed with pt/spouse in the clinic today.      Splinting   Splinting Pt was fitted with a custom hand based thumb spica splint for his right hand/thumb. Pt and his wife were instructed in splinting use, care and precautions. This splint places his right hand/thumb in the functional position of slight ABD and rotation, it allows for tip to tip prehension of his index finger. Pt verbalized understanding in the clinic today and was noted to be Mod I don/doffing the splint after education/demonstration.            WEARING SCHEDULE:   You can remove your to shower and when you need a break. This splint is to provide support and better positioning to your thumb during functional use.Marland Kitchen   PURPOSE:  To prevent movement and for protection until injury can heal  CARE OF SPLINT:  Keep splint away from heat sources including: stove, radiator or furnace, or a car in sunlight. The splint can melt and will no longer fit you properly  Keep away from pets and children  Clean the splint with rubbing alcohol if needed.  * During this time, make sure you also clean your hand/arm as instructed by your therapist and/or perform dressing changes as needed. Then dry hand/arm completely before replacing splint.  PRECAUTIONS/POTENTIAL PROBLEMS: *If you notice or experience increased pain, swelling, numbness, or a lingering reddened area from the  splint: Contact your therapist immediately at [phone]. We will get you scheduled for adjustments as quickly as possible.  (If only straps or hooks need to be replaced and NO adjustments to the splint need to be made, just call the office ahead and let them know you are coming in)  If you have any medical concerns or signs of infection, please call your doctor immediately  Please schedule a follow up appointment for 1x in the next 1-2 weeks for splint adjustments. You may cancel this appointment if your splint is fitting well and you do not need anything.   OT Education - 04/08/21 1411     Education Details Splinting use, care and precautions.    Person(s) Educated Patient;Spouse   Wfie - [name]   Methods Explanation;Demonstration;Handout    Comprehension Verbalized understanding              OT Short Term Goals - 04/08/21 1426       OT SHORT TERM GOAL #1   Title STG's = LTG's Pt wil lbe Mod I splinting use, care and precautions right hand based thumb spica    Time 3    Period Weeks    Status New    Target Date 04/29/21      OT SHORT TERM GOAL #2   Title Pt will reports decreaed pain right thumb with light functional activity within custom  splint to 4/10 or less    Baseline 8/10 at Eval 04/08/21    Time 3    Period Weeks    Status New    Target Date 04/29/21               Plan - 04/08/21 1415     Clinical Impression Statement Pt is a 65 y/o right HD male with DX: R thumb CMC Arthritis. PMH includes but is not limited to: Asthma, arthritis, type II diabetes, HTN, hyperlipidemia. He also reports a h/o bilateral hand paresthesias in the median nerve distributed digits. He has EMG/NCS scheduled for this. He presents today per Dr Tempie Donning for custom hand based thumb spica splinting with his IP free for motion secondary to [facility] arthritis. He was noted with min edema in his right hand/thumb, + Grind test, + pain with palpation to his [place] right, + tinels over his R carpal  tunnel, and + Phalens testing on the right. He was fitted with a custom hand based thumb spica/CMC splint that will provide support to his [facility] joint but allow for functional activity. He was educated in splinting use, care and precuations. He will schedule a follow up appointment in 1-2 weeks for splint check and adjustments and will cancel if he does not need any adjustments. His wife reported that he will be following up/scheduling for NCS/EMG and with Dr Tempie Donning PRN.    OT Occupational Profile and History Problem Focused Assessment - Including review of records relating to presenting problem    Occupational performance deficits (Please refer to evaluation for details): ADL's;Leisure    Body Structure / Function / Physical Skills ADL;Strength;Dexterity;Pain;Edema;UE functional use;Sensation;Decreased knowledge of precautions    Rehab Potential Fair    Clinical Decision Making Limited treatment options, no task modification necessary    Comorbidities Affecting Occupational Performance: May have comorbidities impacting occupational performance    Modification or Assistance to Complete Evaluation  No modification of tasks or assist necessary to complete eval    OT Frequency 1x / week    OT Duration --   Eval + 1-2 additional visits for splint check and adjustments   OT Treatment/Interventions Self-care/ADL training;Splinting;Therapeutic activities;Patient/family education    Plan 1-2 additional visits for Splint check and adjustment right thumb CMC.    Consulted and Agree with Plan of Care Patient;Family member/caregiver    Family Member Consulted Wife - [name]             Patient will benefit from skilled therapeutic intervention in order to improve the following deficits and impairments:   Body Structure / Function / Physical Skills: ADL, Strength, Dexterity, Pain, Edema, UE functional use, Sensation, Decreased knowledge of precautions       Visit Diagnosis: Pain in right hand - Plan: Ot  plan of care cert/re-cert  Pain in joint of right hand - Plan: Ot plan of care cert/re-cert  Stiffness of right hand, not elsewhere classified - Plan: Ot plan of care cert/re-cert  Muscle weakness (generalized) - Plan: Ot plan of care cert/re-cert  Other lack of coordination - Plan: Ot plan of care cert/re-cert  Generalized edema - Plan: Ot plan of care cert/re-cert    Problem List Patient Active Problem List   Diagnosis Date Noted   Arthritis of carpometacarpal [facility] joint of right thumb 04/05/2021   Bilateral hand numbness 04/05/2021   Type II diabetes mellitus with manifestations ([place]) 03/18/2021   Diuretic-induced hypokalemia 10/22/2020   Encounter for general adult medical examination with abnormal findings  08/05/2020   Rising PSA level 08/04/2020   Stage 3a chronic kidney disease ([place]) 08/04/2020   Prediabetes 08/03/2020   Primary hypertension 08/03/2020   Benign prostatic hyperplasia without lower urinary tract symptoms 08/03/2020   Hyperlipidemia with target LDL less than 130 08/03/2020   Primary osteoarthritis of left knee 04/16/2019   Primary osteoarthritis of right knee 02/20/2019   Exercise-induced asthma 07/21/2014   Chronic cough 07/21/2014   Upper airway cough syndrome 07/21/2014   Allergic rhinitis 07/21/2014   Lumbar stenosis 11/06/2013   Urethral stricture 01/07/2013   VOCAL CORD DISORDER 02/19/2009   Asthma 01/27/2009   GERD 01/27/2009    Almyra Deforest, OT/L 04/08/2021, 2:34 PM  [facility] [address] [place], [place], [address] Phone: [phone]   Fax:  [phone]  Name: Douglas Carpenter MRN: [id number] Date of Birth: Jan 29, 1956

## 2021-04-08 NOTE — Patient Instructions (Addendum)
WEARING SCHEDULE:   You can remove your to shower and when you need a break. This splint is to provide support and better positioning to your thumb during functional use.Marland Kitchen   PURPOSE:  To prevent movement and for protection until injury can heal  CARE OF SPLINT:  Keep splint away from heat sources including: stove, radiator or furnace, or a car in sunlight. The splint can melt and will no longer fit you properly  Keep away from pets and children  Clean the splint with rubbing alcohol if needed.  * During this time, make sure you also clean your hand/arm as instructed by your therapist and/or perform dressing changes as needed. Then dry hand/arm completely before replacing splint.  PRECAUTIONS/POTENTIAL PROBLEMS: *If you notice or experience increased pain, swelling, numbness, or a lingering reddened area from the splint: Contact your therapist immediately at [phone]. We will get you scheduled for adjustments as quickly as possible.  (If only straps or hooks need to be replaced and NO adjustments to the splint need to be made, just call the office ahead and let them know you are coming in)  If you have any medical concerns or signs of infection, please call your doctor immediately  Please schedule a follow up appointment for 1x in the next 1-2 weeks for splint adjustments. You may cancel this appointment if your splint is fitting well and you do not need anything.

## 2021-04-12 ENCOUNTER — Other Ambulatory Visit: Payer: Self-pay | Admitting: Internal Medicine

## 2021-04-14 ENCOUNTER — Encounter: Payer: Self-pay | Admitting: Physical Medicine and Rehabilitation

## 2021-04-14 ENCOUNTER — Other Ambulatory Visit: Payer: Self-pay

## 2021-04-14 ENCOUNTER — Ambulatory Visit (INDEPENDENT_AMBULATORY_CARE_PROVIDER_SITE_OTHER): Payer: Medicare PPO | Admitting: Physical Medicine and Rehabilitation

## 2021-04-14 DIAGNOSIS — R202 Paresthesia of skin: Secondary | ICD-10-CM | POA: Diagnosis not present

## 2021-04-14 DIAGNOSIS — E876 Hypokalemia: Secondary | ICD-10-CM

## 2021-04-14 DIAGNOSIS — I10 Essential (primary) hypertension: Secondary | ICD-10-CM

## 2021-04-14 MED ORDER — INDAPAMIDE 2.5 MG PO TABS: 2.5000 mg | ORAL_TABLET | Freq: Every day | ORAL | 0 refills | Status: DC

## 2021-04-14 MED ORDER — CARVEDILOL 12.5 MG PO TABS: 12.5000 mg | ORAL_TABLET | Freq: Two times a day (BID) | ORAL | 3 refills | Status: DC

## 2021-04-14 MED ORDER — POTASSIUM CHLORIDE CRYS ER 20 MEQ PO TBCR
20.0000 meq | EXTENDED_RELEASE_TABLET | Freq: Three times a day (TID) | ORAL | 0 refills | Status: DC
Start: 2021-04-14 — End: 2021-07-09

## 2021-04-14 NOTE — Progress Notes (Signed)
Pain in both hands right worse than left. Pain around right thumb. Electrical sensation between second and third metacarpals on right hand and into wrist. Numbness and swelling. Right hand dominant No lotion per patient

## 2021-04-20 ENCOUNTER — Encounter: Payer: TRICARE For Life (TFL) | Admitting: Occupational Therapy

## 2021-04-21 NOTE — Procedures (Signed)
EMG & NCV Findings: Evaluation of the left median motor nerve showed prolonged distal onset latency (4.8 ms) and decreased conduction velocity (Elbow-Wrist, 49 m/s).  The right median motor nerve showed prolonged distal onset latency (5.0 ms), reduced amplitude (3.6 mV), and decreased conduction velocity (Elbow-Wrist, 48 m/s).  The left median (across palm) sensory nerve showed prolonged distal peak latency (Wrist, 4.6 ms) and prolonged distal peak latency (Palm, 3.2 ms).  The right median (across palm) sensory nerve showed no response (Palm) and prolonged distal peak latency (4.8 ms).  All remaining nerves (as indicated in the following tables) were within normal limits.  All left vs. right side differences were within normal limits.    All examined muscles (as indicated in the following table) showed no evidence of electrical instability.    Impression: The above electrodiagnostic study is ABNORMAL and reveals evidence of:  a moderate to severe right median nerve entrapment at the wrist (carpal tunnel syndrome) affecting sensory and motor components.   a moderate left median nerve entrapment at the wrist (carpal tunnel syndrome) affecting sensory and motor components.   There is no significant electrodiagnostic evidence of any other focal nerve entrapment, brachial plexopathy or cervical radiculopathy.   Recommendations: 1.  Follow-up with referring physician. 2.  Continue current management of symptoms. 3.  Suggest surgical evaluation.  ___________________________ Laurence Spates FAAPMR Board Certified, American Board of Physical Medicine and Rehabilitation    Nerve Conduction Studies Anti Sensory Summary Table   Stim Site NR Peak (ms) Norm Peak (ms) P-T Amp (V) Norm P-T Amp Site1 Site2 Delta-P (ms) Dist (cm) Vel (m/s) Norm Vel (m/s)  Left Median Acr Palm Anti Sensory (2nd Digit)  32.7C  Wrist    *4.6 <3.6 16.5 >10 Wrist Palm 1.4 0.0    Palm    *3.2 <2.0 1.2         Right Median Acr  Palm Anti Sensory (2nd Digit)  32.1C  Wrist    *4.8 <3.6 16.7 >10 Wrist Palm  0.0    Palm *NR  <2.0          Right Radial Anti Sensory (Base 1st Digit)  32.3C  Wrist    2.0 <3.1 26.8  Wrist Base 1st Digit 2.0 0.0    Right Ulnar Anti Sensory (5th Digit)  32.5C  Wrist    3.3 <3.7 18.4 >15.0 Wrist 5th Digit 3.3 14.0 42 >38   Motor Summary Table   Stim Site NR Onset (ms) Norm Onset (ms) O-P Amp (mV) Norm O-P Amp Site1 Site2 Delta-0 (ms) Dist (cm) Vel (m/s) Norm Vel (m/s)  Left Median Motor (Abd Poll Brev)  32.6C  Wrist    *4.8 <4.2 5.0 >5 Elbow Wrist 4.7 23.0 *49 >50  Elbow    9.5  6.4         Right Median Motor (Abd Poll Brev)  32.3C  Wrist    *5.0 <4.2 *3.6 >5 Elbow Wrist 4.8 23.0 *48 >50  Elbow    9.8  3.1         Right Ulnar Motor (Abd Dig Min)  32.3C  Wrist    2.8 <4.2 12.4 >3 B Elbow Wrist 3.7 21.0 57 >53  B Elbow    6.5  12.1  A Elbow B Elbow 1.4 11.0 79 >53  A Elbow    7.9  12.0          EMG   Side Muscle Nerve Root Ins Act Fibs Psw Amp Dur Poly Recrt Int Fraser Din  Comment  Right Abd Poll Brev Median C8-T1 Nml Nml Nml Nml Nml 0 Nml Nml   Right 1stDorInt Ulnar C8-T1 Nml Nml Nml Nml Nml 0 Nml Nml   Right PronatorTeres Median C6-7 Nml Nml Nml Nml Nml 0 Nml Nml   Right Biceps Musculocut C5-6 Nml Nml Nml Nml Nml 0 Nml Nml   Right Deltoid Axillary C5-6 Nml Nml Nml Nml Nml 0 Nml Nml     Nerve Conduction Studies Anti Sensory Left/Right Comparison   Stim Site L Lat (ms) R Lat (ms) L-R Lat (ms) L Amp (V) R Amp (V) L-R Amp (%) Site1 Site2 L Vel (m/s) R Vel (m/s) L-R Vel (m/s)  Median Acr Palm Anti Sensory (2nd Digit)  32.7C  Wrist *4.6 *4.8 0.2 16.5 16.7 1.2 Wrist Palm     Palm *3.2   1.2         Radial Anti Sensory (Base 1st Digit)  32.3C  Wrist  2.0   26.8  Wrist Base 1st Digit     Ulnar Anti Sensory (5th Digit)  32.5C  Wrist  3.3   18.4  Wrist 5th Digit  42    Motor Left/Right Comparison   Stim Site L Lat (ms) R Lat (ms) L-R Lat (ms) L Amp (mV) R Amp (mV) L-R Amp (%)  Site1 Site2 L Vel (m/s) R Vel (m/s) L-R Vel (m/s)  Median Motor (Abd Poll Brev)  32.6C  Wrist *4.8 *5.0 0.2 5.0 *3.6 28.0 Elbow Wrist *49 *48 1  Elbow 9.5 9.8 0.3 6.4 3.1 51.6       Ulnar Motor (Abd Dig Min)  32.3C  Wrist  2.8   12.4  B Elbow Wrist  57   B Elbow  6.5   12.1  A Elbow B Elbow  79   A Elbow  7.9   12.0           Waveforms:

## 2021-04-21 NOTE — Progress Notes (Signed)
Douglas Carpenter - 65 y.o. male MRN [id number]  Date of birth: 1956-03-20  Office Visit Note: Visit Date: 04/14/2021 PCP: [name], MD Referred by: [name], MD  Subjective: Chief Complaint  Patient presents with   Right Hand - Pain, Numbness   Left Hand - Pain   HPI:  Douglas Carpenter is a 65 y.o. male who comes in today at the request of Dr. Audria Nine for electrodiagnostic study of the Bilateral upper extremities.  Patient is Right hand dominant.  He reports years of chronic pain in both hands particularly in the base of the thumb and index finger.  He gets numbness and tingling in the radial digits bilaterally.  Right somewhat more prominent than the left.  He does get nocturnal complaints.  He also has difficulty when he drives his RV gets numbness and tingling in the hands.  He d enies any specific radicular complaints.  He does have type 2 diabetes.No diagnosis in the medical record of neuropathy.  No prior electrodiagnostic study.  ROS Otherwise per HPI.  Assessment & Plan: Visit Diagnoses:    ICD-10-CM   1. Paresthesia of skin  R20.2 NCV with EMG (electromyography)      Plan: Impression: The above electrodiagnostic study is ABNORMAL and reveals evidence of:  a moderate to severe right median nerve entrapment at the wrist (carpal tunnel syndrome) affecting sensory and motor components.   a moderate left median nerve entrapment at the wrist (carpal tunnel syndrome) affecting sensory and motor components.   There is no significant electrodiagnostic evidence of any other focal nerve entrapment, brachial plexopathy or cervical radiculopathy.   Recommendations: 1.  Follow-up with referring physician. 2.  Continue current management of symptoms. 3.  Suggest surgical evaluation.  Meds & Orders: No orders of the defined types were placed in this encounter.   Orders Placed This Encounter  Procedures   NCV with EMG (electromyography)    Follow-up: Return in  about 2 weeks (around 04/28/2021) for Audria Nine, MD.   Procedures: No procedures performed  EMG & NCV Findings: Evaluation of the left median motor nerve showed prolonged distal onset latency (4.8 ms) and decreased conduction velocity (Elbow-Wrist, 49 m/s).  The right median motor nerve showed prolonged distal onset latency (5.0 ms), reduced amplitude (3.6 mV), and decreased conduction velocity (Elbow-Wrist, 48 m/s).  The left median (across palm) sensory nerve showed prolonged distal peak latency (Wrist, 4.6 ms) and prolonged distal peak latency (Palm, 3.2 ms).  The right median (across palm) sensory nerve showed no response (Palm) and prolonged distal peak latency (4.8 ms).  All remaining nerves (as indicated in the following tables) were within normal limits.  All left vs. right side differences were within normal limits.    All examined muscles (as indicated in the following table) showed no evidence of electrical instability.    Impression: The above electrodiagnostic study is ABNORMAL and reveals evidence of:  a moderate to severe right median nerve entrapment at the wrist (carpal tunnel syndrome) affecting sensory and motor components.   a moderate left median nerve entrapment at the wrist (carpal tunnel syndrome) affecting sensory and motor components.   There is no significant electrodiagnostic evidence of any other focal nerve entrapment, brachial plexopathy or cervical radiculopathy.   Recommendations: 1.  Follow-up with referring physician. 2.  Continue current management of symptoms. 3.  Suggest surgical evaluation.  ___________________________ Wonda Olds Board Certified, American Board of Physical Medicine and Rehabilitation  Nerve Conduction Studies Anti Sensory Summary Table   Stim Site NR Peak (ms) Norm Peak (ms) P-T Amp (V) Norm P-T Amp Site1 Site2 Delta-P (ms) Dist (cm) Vel (m/s) Norm Vel (m/s)  Left Median Acr Palm Anti Sensory (2nd Digit)  32.7C   Wrist    *4.6 <3.6 16.5 >10 Wrist Palm 1.4 0.0    Palm    *3.2 <2.0 1.2         Right Median Acr Palm Anti Sensory (2nd Digit)  32.1C  Wrist    *4.8 <3.6 16.7 >10 Wrist Palm  0.0    Palm *NR  <2.0          Right Radial Anti Sensory (Base 1st Digit)  32.3C  Wrist    2.0 <3.1 26.8  Wrist Base 1st Digit 2.0 0.0    Right Ulnar Anti Sensory (5th Digit)  32.5C  Wrist    3.3 <3.7 18.4 >15.0 Wrist 5th Digit 3.3 14.0 42 >38   Motor Summary Table   Stim Site NR Onset (ms) Norm Onset (ms) O-P Amp (mV) Norm O-P Amp Site1 Site2 Delta-0 (ms) Dist (cm) Vel (m/s) Norm Vel (m/s)  Left Median Motor (Abd Poll Brev)  32.6C  Wrist    *4.8 <4.2 5.0 >5 Elbow Wrist 4.7 23.0 *49 >50  Elbow    9.5  6.4         Right Median Motor (Abd Poll Brev)  32.3C  Wrist    *5.0 <4.2 *3.6 >5 Elbow Wrist 4.8 23.0 *48 >50  Elbow    9.8  3.1         Right Ulnar Motor (Abd Dig Min)  32.3C  Wrist    2.8 <4.2 12.4 >3 B Elbow Wrist 3.7 21.0 57 >53  B Elbow    6.5  12.1  A Elbow B Elbow 1.4 11.0 79 >53  A Elbow    7.9  12.0          EMG   Side Muscle Nerve Root Ins Act Fibs Psw Amp Dur Poly Recrt Int Fraser Din Comment  Right Abd Poll Brev Median C8-T1 Nml Nml Nml Nml Nml 0 Nml Nml   Right 1stDorInt Ulnar C8-T1 Nml Nml Nml Nml Nml 0 Nml Nml   Right PronatorTeres Median C6-7 Nml Nml Nml Nml Nml 0 Nml Nml   Right Biceps Musculocut C5-6 Nml Nml Nml Nml Nml 0 Nml Nml   Right Deltoid Axillary C5-6 Nml Nml Nml Nml Nml 0 Nml Nml     Nerve Conduction Studies Anti Sensory Left/Right Comparison   Stim Site L Lat (ms) R Lat (ms) L-R Lat (ms) L Amp (V) R Amp (V) L-R Amp (%) Site1 Site2 L Vel (m/s) R Vel (m/s) L-R Vel (m/s)  Median Acr Palm Anti Sensory (2nd Digit)  32.7C  Wrist *4.6 *4.8 0.2 16.5 16.7 1.2 Wrist Palm     Palm *3.2   1.2         Radial Anti Sensory (Base 1st Digit)  32.3C  Wrist  2.0   26.8  Wrist Base 1st Digit     Ulnar Anti Sensory (5th Digit)  32.5C  Wrist  3.3   18.4  Wrist 5th Digit  42    Motor  Left/Right Comparison   Stim Site L Lat (ms) R Lat (ms) L-R Lat (ms) L Amp (mV) R Amp (mV) L-R Amp (%) Site1 Site2 L Vel (m/s) R Vel (m/s) L-R Vel (m/s)  Median Motor (Abd Poll Brev)  32.6C  Wrist *4.8 *5.0 0.2 5.0 *3.6 28.0 Elbow Wrist *49 *48 1  Elbow 9.5 9.8 0.3 6.4 3.1 51.6       Ulnar Motor (Abd Dig Min)  32.3C  Wrist  2.8   12.4  B Elbow Wrist  57   B Elbow  6.5   12.1  A Elbow B Elbow  79   A Elbow  7.9   12.0           Waveforms:                Clinical History: No specialty comments available.     Objective:  VS:  HT:    WT:   BMI:     BP:   HR: bpm  TEMP: ( )  RESP:  Physical Exam Musculoskeletal:        General: No tenderness.     Comments: Inspection reveals no atrophy of the bilateral APB or FDI or hand intrinsics. There is no swelling, color changes, allodynia or dystrophic changes. There is 5 out of 5 strength in the bilateral wrist extension, finger abduction and long finger flexion. There is intact sensation to light touch in all dermatomal and peripheral nerve distributions.  There is a positive Phalen's test bilaterally. There is a negative Hoffmann's test bilaterally.  Skin:    General: Skin is warm and dry.     Findings: No erythema or rash.  Neurological:     General: No focal deficit present.     Mental Status: He is alert and oriented to person, place, and time.     Sensory: No sensory deficit.     Motor: No weakness or abnormal muscle tone.     Coordination: Coordination normal.     Gait: Gait normal.  Psychiatric:        Mood and Affect: Mood normal.        Behavior: Behavior normal.        Thought Content: Thought content normal.     Imaging: No results found.

## 2021-04-22 DIAGNOSIS — M461 Sacroiliitis, not elsewhere classified: Secondary | ICD-10-CM | POA: Diagnosis not present

## 2021-04-22 DIAGNOSIS — I10 Essential (primary) hypertension: Secondary | ICD-10-CM | POA: Diagnosis not present

## 2021-04-22 DIAGNOSIS — M47816 Spondylosis without myelopathy or radiculopathy, lumbar region: Secondary | ICD-10-CM | POA: Diagnosis not present

## 2021-04-22 DIAGNOSIS — Z6833 Body mass index (BMI) 33.0-33.9, adult: Secondary | ICD-10-CM | POA: Diagnosis not present

## 2021-04-22 DIAGNOSIS — M5134 Other intervertebral disc degeneration, thoracic region: Secondary | ICD-10-CM | POA: Diagnosis not present

## 2021-04-22 NOTE — Progress Notes (Signed)
Called patient to change appointment from Dr. Erlinda Hong to Dr. Tempie Donning. No answer after multiple rings and no voicemail.

## 2021-04-28 ENCOUNTER — Ambulatory Visit (INDEPENDENT_AMBULATORY_CARE_PROVIDER_SITE_OTHER): Payer: Medicare PPO | Admitting: Orthopaedic Surgery

## 2021-04-28 ENCOUNTER — Encounter: Payer: Self-pay | Admitting: Orthopaedic Surgery

## 2021-04-28 ENCOUNTER — Other Ambulatory Visit: Payer: Self-pay

## 2021-04-28 DIAGNOSIS — G5601 Carpal tunnel syndrome, right upper limb: Secondary | ICD-10-CM | POA: Diagnosis not present

## 2021-04-28 DIAGNOSIS — G5602 Carpal tunnel syndrome, left upper limb: Secondary | ICD-10-CM | POA: Diagnosis not present

## 2021-04-28 DIAGNOSIS — G5603 Carpal tunnel syndrome, bilateral upper limbs: Secondary | ICD-10-CM | POA: Diagnosis not present

## 2021-04-28 NOTE — Progress Notes (Signed)
Office Visit Note   Patient: Douglas Carpenter           Date of Birth: September 23, 1955           MRN: [id number] Visit Date: 04/28/2021              Requested by: [name], MD [address] [place],   [id number] PCP: [name], MD   Assessment & Plan: Visit Diagnoses:  1. Right carpal tunnel syndrome   2. Left carpal tunnel syndrome     Plan: Nerve conduction studies show moderate to severe right carpal tunnel syndrome and moderate left carpal tunnel syndrome.  These results were reviewed with Douglas Carpenter and his wife in detail and treatment options were discussed and explained as well as surgical risks and benefits and rehab and recovery.  Based on his options he has elected to move forward with a right carpal tunnel release in the near future followed by a left carpal tunnel release sometime after the right 1.  Questions encouraged and answered.  Follow-Up Instructions: No follow-ups on file.   Orders:  No orders of the defined types were placed in this encounter.  No orders of the defined types were placed in this encounter.     Procedures: No procedures performed   Clinical Data: No additional findings.   Subjective: Chief Complaint  Patient presents with   Left Hand - Pain    Dane is here today to review nerve conduction studies to look for bilateral carpal tunnel syndrome.   Review of Systems   Objective: Vital Signs: There were no vitals taken for this visit.  Physical Exam  Ortho Exam  Exams are unchanged.  Specialty Comments:  No specialty comments available.  Imaging: No results found.   PMFS History: Patient Active Problem List   Diagnosis Date Noted   Arthritis of carpometacarpal [facility]) joint of right thumb 04/05/2021   Bilateral hand numbness 04/05/2021   Type II diabetes mellitus with manifestations ([place]) 03/18/2021   Diuretic-induced hypokalemia 10/22/2020   Encounter for general adult medical examination with abnormal  findings 08/05/2020   Rising PSA level 08/04/2020   Stage 3a chronic kidney disease ([place]) 08/04/2020   Prediabetes 08/03/2020   Primary hypertension 08/03/2020   Benign prostatic hyperplasia without lower urinary tract symptoms 08/03/2020   Hyperlipidemia with target LDL less than 130 08/03/2020   Primary osteoarthritis of left knee 04/16/2019   Primary osteoarthritis of right knee 02/20/2019   Exercise-induced asthma 07/21/2014   Chronic cough 07/21/2014   Upper airway cough syndrome 07/21/2014   Allergic rhinitis 07/21/2014   Lumbar stenosis 11/06/2013   Urethral stricture 01/07/2013   VOCAL CORD DISORDER 02/19/2009   Asthma 01/27/2009   GERD 01/27/2009   Past Medical History:  Diagnosis Date   Allergy    Arthritis    "everywhere"   BPH (benign prostatic hypertrophy)    Chronic rhinitis 01/27/2009   ED (erectile dysfunction)    GERD (gastroesophageal reflux disease)    Headache(784.0)    migraines, as many as 3 in a week     HYPERLIPIDEMIA 01/27/2009   HYPERTENSION 01/27/2009   Mild asthma    seasonal allergies, uses Proair once per yr.    Mild obstructive sleep apnea    per pt -- mild osa test result--  no rx cpap needed   Neuromuscular disorder (HCC)    Sleep apnea    no cpap   SOB (shortness of breath)    Testosterone deficiency  Urethral stricture    Vocal cord anomaly    pt states told from [place]  doctor heard a little gurgle sound -- no farther testing done    Family History  Problem Relation Age of Onset   Cancer Mother        lung   Lung cancer Mother    Heart disease Maternal Grandmother    Heart disease Maternal Grandfather    Cancer Maternal Grandfather        colon, prostate,lung   Colon cancer Neg Hx    Colon polyps Neg Hx    Esophageal cancer Neg Hx    Rectal cancer Neg Hx    Stomach cancer Neg Hx     Past Surgical History:  Procedure Laterality Date   BACK SURGERY     COLONOSCOPY     CYSTOSCOPY WITH URETHRAL DILATATION N/A 01/07/2013    Procedure: CYSTOSCOPY WITH URETHRAL DILATATION BALLOON DILATION ;  Surgeon: Bernestine Amass, MD;  [facility];  Service: Urology;  Laterality: N/A;   LUMBAR LAMINECTOMY/DECOMPRESSION MICRODISCECTOMY Right 11/29/2012   Procedure: Right Lumbar Four to Five Laminectomy/ Decompression Microdiskectomy;  Surgeon: Otilio Connors, MD;  Location: [facility];  Service: Neurosurgery;  Laterality: Right;  LUMBAR LAMINECTOMY/DECOMPRESSION MICRODISCECTOMY 1 LEVEL   POLYPECTOMY     SHOULDER ARTHROSCOPY W/ ACROMIAL REPAIR Left 08-28-2002   TOTAL KNEE ARTHROPLASTY Left 12/02/2019   TOTAL KNEE ARTHROPLASTY Left 12/02/2019   Procedure: LEFT TOTAL KNEE ARTHROPLASTY;  Surgeon: Leandrew Koyanagi, MD;  Location: [facility];  Service: Orthopedics;  Laterality: Left;   UPPER GASTROINTESTINAL ENDOSCOPY     Social History   Occupational History   Occupation: retired  Tobacco Use   Smoking status: Former    Packs/day: 0.50    Years: 22.00    Pack years: 11.00    Types: Cigarettes    Quit date: 06/27/1988    Years since quitting: 32.8   Smokeless tobacco: Never  Vaping Use   Vaping Use: Never used  Substance and Sexual Activity   Alcohol use: Yes    Alcohol/week: 21.0 standard drinks    Types: 21 Shots of liquor per week    Comment: cognac   Drug use: No   Sexual activity: Yes    Partners: Female

## 2021-05-17 ENCOUNTER — Other Ambulatory Visit: Payer: Self-pay

## 2021-05-17 ENCOUNTER — Telehealth (INDEPENDENT_AMBULATORY_CARE_PROVIDER_SITE_OTHER): Payer: Medicare PPO | Admitting: Nurse Practitioner

## 2021-05-17 VITALS — BP 136/77 | HR 108 | Temp 99.3°F

## 2021-05-17 DIAGNOSIS — R52 Pain, unspecified: Secondary | ICD-10-CM | POA: Insufficient documentation

## 2021-05-17 DIAGNOSIS — R051 Acute cough: Secondary | ICD-10-CM

## 2021-05-17 DIAGNOSIS — G4489 Other headache syndrome: Secondary | ICD-10-CM | POA: Diagnosis not present

## 2021-05-17 DIAGNOSIS — J101 Influenza due to other identified influenza virus with other respiratory manifestations: Secondary | ICD-10-CM | POA: Diagnosis not present

## 2021-05-17 DIAGNOSIS — R0602 Shortness of breath: Secondary | ICD-10-CM

## 2021-05-17 LAB — POCT INFLUENZA A/B
Influenza A, POC: POSITIVE — AB
Influenza B, POC: NEGATIVE

## 2021-05-17 MED ORDER — OSELTAMIVIR PHOSPHATE 75 MG PO CAPS: 75.0000 mg | ORAL_CAPSULE | Freq: Two times a day (BID) | ORAL | 0 refills | Status: DC

## 2021-05-17 NOTE — Assessment & Plan Note (Signed)
Continue over-the-counter analgesia.  Pending flu and COVID test.

## 2021-05-17 NOTE — Assessment & Plan Note (Addendum)
History of asthma currently on maintenance inhaler and albuterol inhaler as needed per patient report he is using albuterol inhaler more than normal.  Pending COVID and flu test.  Signs and symptoms discussed as well as seek urgent or emergent care.

## 2021-05-17 NOTE — Assessment & Plan Note (Signed)
Continue over-the-counter medications as needed pending flu and COVID test.

## 2021-05-17 NOTE — Progress Notes (Signed)
Patient ID: Douglas Carpenter, male    DOB: August 02, 1955, 65 y.o.   MRN: [id number]  Virtual visit completed through [place], a video enabled telemedicine application. Due to national recommendations of social distancing due to COVID-19, a virtual visit is felt to be most appropriate for this patient at this time. Reviewed limitations, risks, security and privacy concerns of performing a virtual visit and the availability of in person appointments. I also reviewed that there may be a patient responsible charge related to this service. The patient agreed to proceed.   Attempted to connect via video enabled program. After being unsuccessful we revert to a phone visit that lasted 90mins  Patient location: home Provider location: [place] at [facility], office Persons participating in this virtual visit: patient, provider spouse  If any vitals were documented, they were collected by patient at home unless specified below.    BP 136/77   Pulse (!) 108   Temp 99.3 F (37.4 C)    CC: Cough Subjective:   HPI: Douglas Carpenter is a 65 y.o. male presenting on 05/17/2021 for Cough (Sx started on 05/15/21.Coughing up yellow/brownish phlegm, headache, chest congestion, some chest tightness-has hx of asthma, body aches. No fever or sore throat. Covid test was not done.)   Symptoms started on 05/15/2021 No covid test. Moderna x2 and 2 boosters Sick contact was grandson  [place] without relief  Flu shot  Using albuterol approx 2 a day Former smoker. Has been quit for over 30 years   Relevant past medical, surgical, family and social history reviewed and updated as indicated. Interim medical history since our last visit reviewed. Allergies and medications reviewed and updated. Outpatient Medications Prior to Visit  Medication Sig Dispense Refill   albuterol (PROAIR HFA) 108 (90 Base) MCG/ACT inhaler Inhale 2 puffs into the lungs every 6 (six) hours as needed for wheezing or shortness of breath. 1  Inhaler 3   albuterol (PROVENTIL) (2.5 MG/3ML) 0.083% nebulizer solution Take 3 mLs (2.5 mg total) by nebulization every 6 (six) hours as needed for wheezing or shortness of breath. 360 mL 5   aspirin EC 81 MG tablet Take 1 tablet (81 mg total) by mouth 2 (two) times daily. 84 tablet 0   atorvastatin (LIPITOR) 80 MG tablet Take 1 tablet (80 mg total) by mouth at bedtime. 90 tablet 1   carvedilol (COREG) 12.5 MG tablet Take 1 tablet (12.5 mg total) by mouth 2 (two) times daily. 180 tablet 3   diclofenac Sodium (VOLTAREN) 1 % GEL Apply 2 g topically 4 (four) times daily. 150 g 0   DULoxetine (CYMBALTA) 60 MG capsule Take 1 capsule (60 mg total) by mouth daily. 90 capsule 1   Erenumab-aooe (AIMOVIG) 70 MG/ML SOAJ Inject 70 mg into the skin every 30 (thirty) days.     fluticasone-salmeterol (WIXELA INHUB) 250-50 MCG/ACT AEPB Inhale 1 puff into the lungs in the morning and at bedtime. 180 each 3   gabapentin (NEURONTIN) 600 MG tablet Take 600 mg by mouth 3 (three) times daily.      indapamide (LOZOL) 2.5 MG tablet Take 1 tablet (2.5 mg total) by mouth daily. 90 tablet 0   montelukast (SINGULAIR) 10 MG tablet Take 1 tablet (10 mg total) by mouth at bedtime. 30 tablet 11   olopatadine (PATADAY) 0.1 % ophthalmic solution Place 1 drop into both eyes 2 (two) times daily. 5 mL 0   omeprazole (PRILOSEC) 40 MG capsule Take 40 mg by mouth daily.  potassium chloride SA (KLOR-CON) 20 MEQ tablet Take 1 tablet (20 mEq total) by mouth 3 (three) times daily. 270 tablet 0   umeclidinium bromide (INCRUSE ELLIPTA) 62.5 MCG/INH AEPB Inhale 1 puff into the lungs daily. 1 each 5   No facility-administered medications prior to visit.     Per HPI unless specifically indicated in ROS section below Review of Systems  Constitutional:  Positive for appetite change and fatigue. Negative for chills and fever.  HENT:  Positive for congestion. Negative for ear discharge, ear pain, sinus pressure, sinus pain and sore throat.    Respiratory:  Positive for cough (yellow and brown) and shortness of breath (DOE).   Cardiovascular:  Negative for chest pain.  Gastrointestinal:  Negative for abdominal pain, diarrhea, nausea and vomiting.  Musculoskeletal:  Positive for myalgias.  Neurological:  Positive for headaches.  Objective:  BP 136/77   Pulse (!) 108   Temp 99.3 F (37.4 C)   Wt Readings from Last 3 Encounters:  04/05/21 218 lb (98.9 kg)  03/19/21 218 lb (98.9 kg)  03/18/21 217 lb (98.4 kg)       Physical exam: Gen: alert, NAD, not ill appearing Pulm: speaks in complete sentences without increased work of breathing Psych: normal mood, normal thought content      Results for orders placed or performed in visit on 03/18/21  Hemoglobin A1c  Result Value Ref Range   Hgb A1c MFr Bld 6.5 4.6 - 6.5 %  Magnesium  Result Value Ref Range   Magnesium 1.9 1.5 - 2.5 mg/dL  Basic metabolic panel  Result Value Ref Range   Sodium 138 135 - 145 mEq/L   Potassium 3.3 (L) 3.5 - 5.1 mEq/L   Chloride 98 96 - 112 mEq/L   CO2 30 19 - 32 mEq/L   Glucose, Bld 114 (H) 70 - 99 mg/dL   BUN 19 6 - 23 mg/dL   Creatinine, Ser 1.07 0.40 - 1.50 mg/dL   GFR 72.68 >60.00 mL/min   Calcium 9.4 8.4 - 10.5 mg/dL   Assessment & Plan:   Problem List Items Addressed This Visit       Other   Other headache syndrome - Primary    Continue over-the-counter analgesia.  Pending flu and COVID test.      Relevant Orders   Influenza A/B   Novel Coronavirus, NAA ([facility])   Body aches    Continue over-the-counter medications as needed pending flu and COVID test.      Relevant Orders   Influenza A/B   Acute cough    Patiently currently experiencing cough with yellow-brown sputum production.  Does have history of asthma currently on maintenance inhaler and albuterol inhaler with increased albuterol inhaler usage.  Pending flu and COVID test.      Relevant Orders   Influenza A/B   Novel Coronavirus, NAA ([facility])   Shortness  of breath    History of asthma currently on maintenance inhaler and albuterol inhaler as needed per patient report he is using albuterol inhaler more than normal.  Pending COVID and flu test.  Signs and symptoms discussed as well as seek urgent or emergent care.        No orders of the defined types were placed in this encounter.  No orders of the defined types were placed in this encounter.   I discussed the assessment and treatment plan with the patient. The patient was provided an opportunity to ask questions and all were answered. The patient agreed with  the plan and demonstrated an understanding of the instructions. The patient was advised to call back or seek an in-person evaluation if the symptoms worsen or if the condition fails to improve as anticipated.  Follow up plan: No follow-ups on file.  Romilda Garret, NP

## 2021-05-17 NOTE — Assessment & Plan Note (Signed)
Discussed treatment options with patient.  Elected to do treatment. Start Tamiflu 75 mg twice daily for 5 days

## 2021-05-17 NOTE — Assessment & Plan Note (Signed)
Patiently currently experiencing cough with yellow-brown sputum production.  Does have history of asthma currently on maintenance inhaler and albuterol inhaler with increased albuterol inhaler usage.  Pending flu and COVID test.

## 2021-05-18 ENCOUNTER — Telehealth: Payer: Self-pay | Admitting: Nurse Practitioner

## 2021-05-18 ENCOUNTER — Telehealth: Payer: Self-pay | Admitting: Orthopaedic Surgery

## 2021-05-18 DIAGNOSIS — J101 Influenza due to other identified influenza virus with other respiratory manifestations: Secondary | ICD-10-CM

## 2021-05-18 MED ORDER — OSELTAMIVIR PHOSPHATE 75 MG PO CAPS: 75.0000 mg | ORAL_CAPSULE | Freq: Two times a day (BID) | ORAL | 0 refills | Status: AC

## 2021-05-18 NOTE — Telephone Encounter (Signed)
Ok please let debbie know.  Thanks.

## 2021-05-18 NOTE — Telephone Encounter (Signed)
Please see message below from [facility] w/ [place].  Patient will  need to be rescheduled from 05-27-21.  Currently in the process of trying to notify patient.      Both patient and wife are both "sick"  He has a terrible cough.  Symptoms started yesterday. Patient denies it's Covid.   According to our anesthesia protocol, patient will need to reschedule in two weeks from first day of symptoms (week of Dec. 4th).  If it is Covid, he will need to reschedule in 4 weeks from first day of symptoms (week of Dec. 18)    Debbie, please call patient to reschedule--his symptoms will need to be completely gone (including cough) before he can have surgery.    Felicita Gage RN BSN (Pre-op Assessment) [phone] ext [phone] Fax [phone] [email or web address]  [place] [address]

## 2021-05-18 NOTE — Telephone Encounter (Signed)
[facility] [address] [facility] for both patients. Re sent to [facility] drive as requested. This was approved verbally by [facility]. Patient advised.

## 2021-05-19 LAB — NOVEL CORONAVIRUS, NAA: SARS-CoV-2, NAA: NOT DETECTED

## 2021-05-19 LAB — SARS-COV-2, NAA 2 DAY TAT

## 2021-05-24 ENCOUNTER — Ambulatory Visit: Payer: TRICARE For Life (TFL) | Admitting: Pulmonary Disease

## 2021-05-25 ENCOUNTER — Ambulatory Visit (HOSPITAL_COMMUNITY)
Admission: EM | Admit: 2021-05-25 | Discharge: 2021-05-25 | Disposition: A | Discharge status: Home / Self Care | Payer: TRICARE For Life (TFL) | Attending: Family Medicine | Admitting: Family Medicine

## 2021-05-25 ENCOUNTER — Other Ambulatory Visit: Payer: Self-pay

## 2021-05-25 ENCOUNTER — Encounter (HOSPITAL_COMMUNITY): Payer: Self-pay

## 2021-05-25 DIAGNOSIS — J111 Influenza due to unidentified influenza virus with other respiratory manifestations: Secondary | ICD-10-CM | POA: Diagnosis not present

## 2021-05-25 DIAGNOSIS — H1031 Unspecified acute conjunctivitis, right eye: Secondary | ICD-10-CM

## 2021-05-25 DIAGNOSIS — R051 Acute cough: Secondary | ICD-10-CM

## 2021-05-25 DIAGNOSIS — J4521 Mild intermittent asthma with (acute) exacerbation: Secondary | ICD-10-CM | POA: Diagnosis not present

## 2021-05-25 MED ORDER — HYDROCODONE BIT-HOMATROP MBR 5-1.5 MG/5ML PO SOLN: 5.0000 mL | Freq: Four times a day (QID) | ORAL | 0 refills | Status: DC | PRN

## 2021-05-25 MED ORDER — TOBRAMYCIN 0.3 % OP SOLN: 1.0000 [drp] | Freq: Four times a day (QID) | OPHTHALMIC | 0 refills | Status: DC

## 2021-05-25 MED ORDER — PREDNISONE 20 MG PO TABS: 40.0000 mg | ORAL_TABLET | Freq: Every day | ORAL | 0 refills | Status: DC

## 2021-05-25 NOTE — ED Triage Notes (Incomplete)
Pt reports cough, headache, fatigue x 1 week; aching and redness in the right eye x 4-5 days. Pt reports he tested positive for Flu 1 week ago. States he started feeling bad again after  he finished Tamiflu 3 days ago.

## 2021-05-25 NOTE — Discharge Instructions (Signed)
Be aware, your cough medication may cause drowsiness. Please do not drive, operate heavy machinery or make important decisions while on this medication, it can cloud your judgement.  

## 2021-05-25 NOTE — ED Provider Notes (Signed)
[place]   [id number] 05/25/21 Arrival Time: 0825  ASSESSMENT & PLAN:  1. Influenza   2. Acute cough   3. Acute conjunctivitis of right eye, unspecified acute conjunctivitis type   4. Mild intermittent asthma with acute exacerbation    No resp distress. Discussed typical duration of viral illnesses and lingering cough. OTC symptom care as needed.  Meds ordered this encounter  Medications   predniSONE (DELTASONE) 20 MG tablet    Sig: Take 2 tablets (40 mg total) by mouth daily.    Dispense:  10 tablet    Refill:  0   HYDROcodone bit-homatropine (HYCODAN) 5-1.5 MG/5ML syrup    Sig: Take 5 mLs by mouth every 6 (six) hours as needed for cough.    Dispense:  90 mL    Refill:  0   tobramycin (TOBREX) 0.3 % ophthalmic solution    Sig: Place 1 drop into the right eye every 6 (six) hours.    Dispense:  5 mL    Refill:  0     Follow-up Information     [name], MD.   Specialty: Internal Medicine Why: If worsening or failing to improve as anticipated. Contact information: [place] [address] [phone]                  Discharge Instructions      Be aware, your cough medication may cause drowsiness. Please do not drive, operate heavy machinery or make important decisions while on this medication, it can cloud your judgement.      Reviewed expectations re: course of current medical issues. Questions answered. Outlined signs and symptoms indicating need for more acute intervention. Understanding verbalized. After Visit Summary given.   SUBJECTIVE: History from: patient. Douglas Carpenter is a 65 y.o. male who reports: persistent coughing and wheezing; recovering from dx influenza last week; using inhaler more often; no spec SOB; no CP. Denies: fever. Normal PO intake without n/v/d. Past few d with R eye redness/irritation/watery drainage. No eye pain.   OBJECTIVE:  Vitals:   05/25/21 1011  BP: (!) 154/93  Pulse:  81  Resp: (!) 22  Temp: 98.4 F (36.9 C)  TempSrc: Oral  SpO2: 95%    Recheck RR: 18 General appearance: alert; no distress Eyes: PERRLA; EOMI; conjunctiva with slight medical injection on R; watery drainage HENT: [place]; AT; with nasal congestion Neck: supple  Lungs: speaks full sentences without difficulty; unlabored; bilat exp wheezing Extremities: no edema Skin: warm and dry Neurologic: normal gait Psychological: alert and cooperative; normal mood and affect  Labs: Results for orders placed or performed in visit on 05/17/21  Novel Coronavirus, NAA ([facility])   Specimen: Nasopharyngeal(NP) swabs in vial transport medium   Nasopharynge  Previous  Result Value Ref Range   SARS-CoV-2, NAA Not Detected Not Detected  SARS-COV-2, NAA 2 DAY TAT   Nasopharynge  Previous  Result Value Ref Range   SARS-CoV-2, NAA 2 DAY TAT Performed   Influenza A/B  Result Value Ref Range   Influenza A, POC Positive (A) Negative   Influenza B, POC Negative Negative     Allergies  Allergen Reactions   Aspirin Rash    Pt can tolerate low doses-- INTOLERANT TO HIGHER DOSES    Past Medical History:  Diagnosis Date   Allergy    Arthritis    "everywhere"   BPH (benign prostatic hypertrophy)    Chronic rhinitis 01/27/2009   ED (erectile dysfunction)  GERD (gastroesophageal reflux disease)    Headache(784.0)    migraines, as many as 3 in a week     HYPERLIPIDEMIA 01/27/2009   HYPERTENSION 01/27/2009   Mild asthma    seasonal allergies, uses Proair once per yr.    Mild obstructive sleep apnea    per pt -- mild osa test result--  no rx cpap needed   Neuromuscular disorder (HCC)    Sleep apnea    no cpap   SOB (shortness of breath)    Testosterone deficiency    Urethral stricture    Vocal cord anomaly    pt states told from [place]  doctor heard a little gurgle sound -- no farther testing done   Social History   Socioeconomic History   Marital status: Married    Spouse name: Not on file    Number of children: Not on file   Years of education: Not on file   Highest education level: Not on file  Occupational History   Occupation: retired  Tobacco Use   Smoking status: Former    Packs/day: 0.50    Years: 22.00    Pack years: 11.00    Types: Cigarettes    Quit date: 06/27/1988    Years since quitting: 32.9   Smokeless tobacco: Never  Vaping Use   Vaping Use: Never used  Substance and Sexual Activity   Alcohol use: Yes    Alcohol/week: 21.0 standard drinks    Types: 21 Shots of liquor per week    Comment: cognac   Drug use: No   Sexual activity: Yes    Partners: Female  Other Topics Concern   Not on file  Social History Narrative   Not on file   Social Determinants of Health   Financial Resource Strain: Not on file  Food Insecurity: Not on file  Transportation Needs: Not on file  Physical Activity: Not on file  Stress: Not on file  Social Connections: Not on file  Intimate Partner Violence: Not on file   Family History  Problem Relation Age of Onset   Cancer Mother        lung   Lung cancer Mother    Heart disease Maternal Grandmother    Heart disease Maternal Grandfather    Cancer Maternal Grandfather        colon, prostate,lung   Colon cancer Neg Hx    Colon polyps Neg Hx    Esophageal cancer Neg Hx    Rectal cancer Neg Hx    Stomach cancer Neg Hx    Past Surgical History:  Procedure Laterality Date   BACK SURGERY     COLONOSCOPY     CYSTOSCOPY WITH URETHRAL DILATATION N/A 01/07/2013   Procedure: CYSTOSCOPY WITH URETHRAL DILATATION BALLOON DILATION ;  Surgeon: Bernestine Amass, MD;  Location: [facility];  Service: Urology;  Laterality: N/A;   LUMBAR LAMINECTOMY/DECOMPRESSION MICRODISCECTOMY Right 11/29/2012   Procedure: Right Lumbar Four to Five Laminectomy/ Decompression Microdiskectomy;  Surgeon: Otilio Connors, MD;  Location: [place] [facility];  Service: Neurosurgery;  Laterality: Right;  LUMBAR LAMINECTOMY/DECOMPRESSION  MICRODISCECTOMY 1 LEVEL   POLYPECTOMY     SHOULDER ARTHROSCOPY W/ ACROMIAL REPAIR Left 08-28-2002   TOTAL KNEE ARTHROPLASTY Left 12/02/2019   TOTAL KNEE ARTHROPLASTY Left 12/02/2019   Procedure: LEFT TOTAL KNEE ARTHROPLASTY;  Surgeon: Leandrew Koyanagi, MD;  Location: [place];  Service: Orthopedics;  Laterality: Left;   UPPER GASTROINTESTINAL ENDOSCOPY       Vanessa Kick, MD  05/25/21 1029  

## 2021-05-26 ENCOUNTER — Ambulatory Visit: Payer: TRICARE For Life (TFL) | Admitting: Internal Medicine

## 2021-05-31 ENCOUNTER — Other Ambulatory Visit: Payer: Self-pay | Admitting: Physician Assistant

## 2021-05-31 MED ORDER — ONDANSETRON HCL 4 MG PO TABS: 4.0000 mg | ORAL_TABLET | Freq: Three times a day (TID) | ORAL | 0 refills | Status: DC | PRN

## 2021-05-31 MED ORDER — HYDROCODONE-ACETAMINOPHEN 5-325 MG PO TABS: 1.0000 | ORAL_TABLET | Freq: Three times a day (TID) | ORAL | 0 refills | Status: DC | PRN

## 2021-06-03 ENCOUNTER — Encounter: Payer: Medicare PPO | Admitting: Orthopaedic Surgery

## 2021-06-03 DIAGNOSIS — G5601 Carpal tunnel syndrome, right upper limb: Secondary | ICD-10-CM | POA: Diagnosis not present

## 2021-06-04 ENCOUNTER — Other Ambulatory Visit: Payer: Self-pay | Admitting: Physician Assistant

## 2021-06-04 ENCOUNTER — Telehealth: Payer: Self-pay | Admitting: Orthopaedic Surgery

## 2021-06-04 MED ORDER — TRAMADOL HCL 50 MG PO TABS: 50.0000 mg | ORAL_TABLET | Freq: Three times a day (TID) | ORAL | 0 refills | Status: DC | PRN

## 2021-06-04 MED ORDER — TRAMADOL HCL 50 MG PO TABS: 50.0000 mg | ORAL_TABLET | Freq: Four times a day (QID) | ORAL | 0 refills | Status: DC | PRN

## 2021-06-04 NOTE — Telephone Encounter (Signed)
Dr. Erlinda Hong pt

## 2021-06-04 NOTE — Telephone Encounter (Signed)
Spoke to wife

## 2021-06-04 NOTE — Telephone Encounter (Signed)
Pt's wife Vivien Rota called requesting a call back to change pin medication for pt. Please call pt at [phone].

## 2021-06-09 ENCOUNTER — Ambulatory Visit (INDEPENDENT_AMBULATORY_CARE_PROVIDER_SITE_OTHER): Payer: TRICARE For Life (TFL) | Admitting: Orthopaedic Surgery

## 2021-06-09 ENCOUNTER — Encounter: Payer: Self-pay | Admitting: Orthopaedic Surgery

## 2021-06-09 ENCOUNTER — Other Ambulatory Visit: Payer: Self-pay

## 2021-06-09 DIAGNOSIS — G5601 Carpal tunnel syndrome, right upper limb: Secondary | ICD-10-CM

## 2021-06-09 DIAGNOSIS — Z9889 Other specified postprocedural states: Secondary | ICD-10-CM

## 2021-06-09 NOTE — Progress Notes (Signed)
Post-Op Visit Note   Patient: Douglas Carpenter           Date of Birth: 09-16-1955           MRN: [id number] Visit Date: 06/09/2021 PCP: [name], MD   Assessment & Plan:  Chief Complaint:  Chief Complaint  Patient presents with   Right Wrist - Routine Post Op   Visit Diagnoses:  1. Right carpal tunnel syndrome   2. S/P carpal tunnel release     Plan: Patient is a pleasant 65 year old gentleman who comes in today 1 week status post right carpal tunnel release from severe compression median nerve, date of surgery 06/03/2021.  He has been doing well.  No tingling or burning but he does note decreased sensation to the volar forearm just proximal to the incision.  He has had minimal to no pain.  Examination of the right wrist reveals a well-healing surgical incision with nylon sutures in place.  No evidence of infection or cellulitis.  He does have decreased sensation just proximal to the incision.  He has diffuse ecchymosis.  Fingers are warm well perfused.  Today, the wound was cleaned and recovered.  Removable Velcro splint applied which she will wear at all times but may remove for hygiene.  We will start with gentle range of motion exercises of the fingers.  Follow-up with [place] next week for suture removal.  No heavy lifting or submerging his hand underwater for 3 more weeks.  Call with concerns or questions.  Follow-Up Instructions: Return in about 1 week (around 06/16/2021).   Orders:  No orders of the defined types were placed in this encounter.  No orders of the defined types were placed in this encounter.   Imaging: No new imaging  PMFS History: Patient Active Problem List   Diagnosis Date Noted   Other headache syndrome 05/17/2021   Body aches 05/17/2021   Acute cough 05/17/2021   Shortness of breath 05/17/2021   Influenza A 05/17/2021   Arthritis of carpometacarpal (CMC) joint of right thumb 04/05/2021   Bilateral hand numbness 04/05/2021   Type II diabetes  mellitus with manifestations ([place]) 03/18/2021   Diuretic-induced hypokalemia 10/22/2020   Encounter for general adult medical examination with abnormal findings 08/05/2020   Rising PSA level 08/04/2020   Stage 3a chronic kidney disease ([place]) 08/04/2020   Prediabetes 08/03/2020   Primary hypertension 08/03/2020   Benign prostatic hyperplasia without lower urinary tract symptoms 08/03/2020   Hyperlipidemia with target LDL less than 130 08/03/2020   Primary osteoarthritis of left knee 04/16/2019   Primary osteoarthritis of right knee 02/20/2019   Exercise-induced asthma 07/21/2014   Chronic cough 07/21/2014   Upper airway cough syndrome 07/21/2014   Allergic rhinitis 07/21/2014   Lumbar stenosis 11/06/2013   Urethral stricture 01/07/2013   VOCAL CORD DISORDER 02/19/2009   Asthma 01/27/2009   GERD 01/27/2009   Past Medical History:  Diagnosis Date   Allergy    Arthritis    "everywhere"   BPH (benign prostatic hypertrophy)    Chronic rhinitis 01/27/2009   ED (erectile dysfunction)    GERD (gastroesophageal reflux disease)    Headache(784.0)    migraines, as many as 3 in a week     HYPERLIPIDEMIA 01/27/2009   HYPERTENSION 01/27/2009   Mild asthma    seasonal allergies, uses Proair once per yr.    Mild obstructive sleep apnea    per pt -- mild osa test result--  no rx cpap needed  Neuromuscular disorder (HCC)    Sleep apnea    no cpap   SOB (shortness of breath)    Testosterone deficiency    Urethral stricture    Vocal cord anomaly    pt states told from [place]  doctor heard a little gurgle sound -- no farther testing done    Family History  Problem Relation Age of Onset   Cancer Mother        lung   Lung cancer Mother    Heart disease Maternal Grandmother    Heart disease Maternal Grandfather    Cancer Maternal Grandfather        colon, prostate,lung   Colon cancer Neg Hx    Colon polyps Neg Hx    Esophageal cancer Neg Hx    Rectal cancer Neg Hx    Stomach cancer  Neg Hx     Past Surgical History:  Procedure Laterality Date   BACK SURGERY     COLONOSCOPY     CYSTOSCOPY WITH URETHRAL DILATATION N/A 01/07/2013   Procedure: CYSTOSCOPY WITH URETHRAL DILATATION BALLOON DILATION ;  Surgeon: Bernestine Amass, MD;  Location[facility];  Service: Urology;  Laterality: N/A;   LUMBAR LAMINECTOMY/DECOMPRESSION MICRODISCECTOMY Right 11/29/2012   Procedure: Right Lumbar Four to Five Laminectomy/ Decompression Microdiskectomy;  Surgeon: Otilio Connors, MD;  Location: [place] [facility];  Service: Neurosurgery;  Laterality: Right;  LUMBAR LAMINECTOMY/DECOMPRESSION MICRODISCECTOMY 1 LEVEL   POLYPECTOMY     SHOULDER ARTHROSCOPY W/ ACROMIAL REPAIR Left 08-28-2002   TOTAL KNEE ARTHROPLASTY Left 12/02/2019   TOTAL KNEE ARTHROPLASTY Left 12/02/2019   Procedure: LEFT TOTAL KNEE ARTHROPLASTY;  Surgeon: Leandrew Koyanagi, MD;  Location: [place];  Service: Orthopedics;  Laterality: Left;   UPPER GASTROINTESTINAL ENDOSCOPY     Social History   Occupational History   Occupation: retired  Tobacco Use   Smoking status: Former    Packs/day: 0.50    Years: 22.00    Pack years: 11.00    Types: Cigarettes    Quit date: 06/27/1988    Years since quitting: 32.9   Smokeless tobacco: Never  Vaping Use   Vaping Use: Never used  Substance and Sexual Activity   Alcohol use: Yes    Alcohol/week: 21.0 standard drinks    Types: 21 Shots of liquor per week    Comment: cognac   Drug use: No   Sexual activity: Yes    Partners: Female

## 2021-06-10 ENCOUNTER — Encounter: Payer: TRICARE For Life (TFL) | Admitting: Orthopaedic Surgery

## 2021-06-10 DIAGNOSIS — M5414 Radiculopathy, thoracic region: Secondary | ICD-10-CM | POA: Diagnosis not present

## 2021-06-14 ENCOUNTER — Ambulatory Visit (INDEPENDENT_AMBULATORY_CARE_PROVIDER_SITE_OTHER): Payer: Medicare PPO | Admitting: Adult Health

## 2021-06-14 ENCOUNTER — Encounter: Payer: Self-pay | Admitting: Adult Health

## 2021-06-14 ENCOUNTER — Other Ambulatory Visit: Payer: Self-pay

## 2021-06-14 ENCOUNTER — Ambulatory Visit (INDEPENDENT_AMBULATORY_CARE_PROVIDER_SITE_OTHER): Payer: Medicare PPO

## 2021-06-14 VITALS — BP 130/80 | HR 85 | Temp 98.5°F | Ht 67.0 in | Wt 220.8 lb

## 2021-06-14 DIAGNOSIS — J40 Bronchitis, not specified as acute or chronic: Secondary | ICD-10-CM | POA: Diagnosis not present

## 2021-06-14 DIAGNOSIS — J45909 Unspecified asthma, uncomplicated: Secondary | ICD-10-CM | POA: Diagnosis not present

## 2021-06-14 DIAGNOSIS — J4540 Moderate persistent asthma, uncomplicated: Secondary | ICD-10-CM

## 2021-06-14 MED ORDER — AZITHROMYCIN 250 MG PO TABS: ORAL_TABLET | ORAL | 0 refills | Status: AC

## 2021-06-14 MED ORDER — BENZONATATE 200 MG PO CAPS: 200.0000 mg | ORAL_CAPSULE | Freq: Three times a day (TID) | ORAL | 1 refills | Status: DC | PRN

## 2021-06-14 MED ORDER — PREDNISONE 10 MG PO TABS: ORAL_TABLET | ORAL | 0 refills | Status: DC

## 2021-06-14 NOTE — Patient Instructions (Addendum)
Chest xray today .  Zpack take as directed .  Prednisone taper over next week  Mucinex Twice daily  As needed  cough/congestion  Delsym 2 tsp Twice daily  for cough  as needed  Tessalon Three times a day for cough As needed   Begin Zyrtec 10mg  daily for 1 week then as needed  Saline nasal rinses Twice daily   Saline nasal gel At bedtime   Continue on Wixela and Incruse .  Albuterol inhaler /neb As needed   Sips of water to soothe throat. Avoid throat clearing . NO MINTS  Follow up with Dr. Halford Chessman in 2-3 months and As needed   Please contact office for sooner follow up if symptoms do not improve or worsen or seek emergency care

## 2021-06-14 NOTE — Progress Notes (Signed)
@Patient  ID: Douglas Carpenter, male    DOB: 1956-03-27, 65 y.o.   MRN: [id number]  Chief Complaint  Patient presents with   Acute Visit    Referring provider: Etta Grandchild, MD  HPI: 65 yo male former smoker followed for Asthma , chronic cough , AR , VCD   TEST/EVENTS :  HST 03/07/13 >> AHI 3.9, SaO2 low 80% PFT 02/19/09 >> FEV1 2.90 (90%), FEV1% 70, TLC 5.62(93%), DLCO 99%, +BD PFT 07/21/14 >> FEV1 2.70 (93%), FEV1% 74, TLC 5.97 (91%), RV 2.65 (126%), DLCO 106%, +BD RAST 07/21/14 >> react to dust mites, grasses; IgE 49 Nitrous oxide 10/27/15 >> 228  06/14/2021 Acute OV : Flu and Asthma  Patient presents for an acute office visit . Complains of last month with cough congestion , drainage , fatigue. Tested positive on 05/17/21 , treated with Tamiflu . Wife had Flu as well. Went to ER . Treated with prednisone burst. Feels some better but cough is lingering with thick mucus and wheezing . Cough is getting worse with thick mucus .  Appetite is good with n/v./d .  Cough is keeping him up. Taking otc cough syrup without much relief.  Remains on [place] and Incruse .  Had increased albuterol neb use.      Allergies  Allergen Reactions   Aspirin Rash    Pt can tolerate low doses-- INTOLERANT TO HIGHER DOSES    Immunization History  Administered Date(s) Administered   Fluad Quad(high Dose 65+) 03/18/2021   Influenza Split 03/21/2012   Influenza Whole 03/08/2011   Influenza, High Dose Seasonal PF 03/27/2020   Influenza,inj,Quad PF,6+ Mos 05/26/2014, 03/06/2015, 04/14/2016, 04/03/2017, 04/03/2018, 04/11/2019, 03/13/2020   Influenza-Unspecified 07/24/2003, 07/30/2004, 08/24/2006, 04/12/2007, 04/01/2008, 06/08/2009, 04/06/2010, 02/26/2011, 02/26/2012, 04/25/2013, 05/13/2014   Moderna Sars-Covid-2 Vaccination 08/22/2019, 09/19/2019, 04/21/2020, 11/03/2020   Pneumococcal Conjugate-13 05/26/2014, 05/03/2018   Pneumococcal Polysaccharide-23 11/30/2012, 05/13/2014, 08/03/2020    Tdap 04/18/2018   Zoster Recombinat (Shingrix) 03/13/2020, 05/25/2020    Past Medical History:  Diagnosis Date   Allergy    Arthritis    "everywhere"   BPH (benign prostatic hypertrophy)    Chronic rhinitis 01/27/2009   ED (erectile dysfunction)    GERD (gastroesophageal reflux disease)    Headache(784.0)    migraines, as many as 3 in a week     HYPERLIPIDEMIA 01/27/2009   HYPERTENSION 01/27/2009   Mild asthma    seasonal allergies, uses Proair once per yr.    Mild obstructive sleep apnea    per pt -- mild osa test result--  no rx cpap needed   Neuromuscular disorder (HCC)    Sleep apnea    no cpap   SOB (shortness of breath)    Testosterone deficiency    Urethral stricture    Vocal cord anomaly    pt states told from [place]  doctor heard a little gurgle sound -- no farther testing done    Tobacco History: Social History   Tobacco Use  Smoking Status Former   Packs/day: 0.50   Years: 22.00   Pack years: 11.00   Types: Cigarettes   Quit date: 06/27/1988   Years since quitting: 32.9  Smokeless Tobacco Never   Counseling given: Not Answered   Outpatient Medications Prior to Visit  Medication Sig Dispense Refill   albuterol (PROAIR HFA) 108 (90 Base) MCG/ACT inhaler Inhale 2 puffs into the lungs every 6 (six) hours as needed for wheezing or shortness of breath. 1 Inhaler 3   albuterol (PROVENTIL) (  2.5 MG/3ML) 0.083% nebulizer solution Take 3 mLs (2.5 mg total) by nebulization every 6 (six) hours as needed for wheezing or shortness of breath. 360 mL 5   aspirin EC 81 MG tablet Take 1 tablet (81 mg total) by mouth 2 (two) times daily. 84 tablet 0   atorvastatin (LIPITOR) 80 MG tablet Take 1 tablet (80 mg total) by mouth at bedtime. 90 tablet 1   carvedilol (COREG) 12.5 MG tablet Take 1 tablet (12.5 mg total) by mouth 2 (two) times daily. 180 tablet 3   diclofenac Sodium (VOLTAREN) 1 % GEL Apply 2 g topically 4 (four) times daily. 150 g 0   DULoxetine (CYMBALTA) 60 MG  capsule Take 1 capsule (60 mg total) by mouth daily. 90 capsule 1   Erenumab-aooe (AIMOVIG) 70 MG/ML SOAJ Inject 70 mg into the skin every 30 (thirty) days.     fluticasone-salmeterol (WIXELA INHUB) 250-50 MCG/ACT AEPB Inhale 1 puff into the lungs in the morning and at bedtime. 180 each 3   gabapentin (NEURONTIN) 600 MG tablet Take 600 mg by mouth 3 (three) times daily.      indapamide (LOZOL) 2.5 MG tablet Take 1 tablet (2.5 mg total) by mouth daily. 90 tablet 0   montelukast (SINGULAIR) 10 MG tablet Take 1 tablet (10 mg total) by mouth at bedtime. 30 tablet 11   olopatadine (PATADAY) 0.1 % ophthalmic solution Place 1 drop into both eyes 2 (two) times daily. 5 mL 0   omeprazole (PRILOSEC) 40 MG capsule Take 40 mg by mouth daily.      ondansetron (ZOFRAN) 4 MG tablet Take 1 tablet (4 mg total) by mouth every 8 (eight) hours as needed for nausea or vomiting. (Patient not taking: Reported on 06/14/2021) 40 tablet 0   potassium chloride SA (KLOR-CON) 20 MEQ tablet Take 1 tablet (20 mEq total) by mouth 3 (three) times daily. 270 tablet 0   tobramycin (TOBREX) 0.3 % ophthalmic solution Place 1 drop into the right eye every 6 (six) hours. 5 mL 0   umeclidinium bromide (INCRUSE ELLIPTA) 62.5 MCG/INH AEPB Inhale 1 puff into the lungs daily. 1 each 5   HYDROcodone bit-homatropine (HYCODAN) 5-1.5 MG/5ML syrup Take 5 mLs by mouth every 6 (six) hours as needed for cough. (Patient not taking: Reported on 06/14/2021) 90 mL 0   HYDROcodone-acetaminophen (NORCO) 5-325 MG tablet Take 1 tablet by mouth 3 (three) times daily as needed. To be taken after surgery (Patient not taking: Reported on 06/14/2021) 20 tablet 0   predniSONE (DELTASONE) 20 MG tablet Take 2 tablets (40 mg total) by mouth daily. (Patient not taking: Reported on 06/14/2021) 10 tablet 0   traMADol (ULTRAM) 50 MG tablet Take 1-2 tablets (50-100 mg total) by mouth 3 (three) times daily as needed. (Patient not taking: Reported on 06/14/2021) 30 tablet 0    traMADol (ULTRAM) 50 MG tablet Take 1 tablet (50 mg total) by mouth every 6 (six) hours as needed. (Patient not taking: Reported on 06/14/2021) 30 tablet 0   No facility-administered medications prior to visit.     Review of Systems:   Constitutional:   No  weight loss, night sweats,  Fevers, chills, +fatigue, or  lassitude.  HEENT:   No headaches,  Difficulty swallowing,  Tooth/dental problems, or  Sore throat,                No sneezing, itching, ear ache,+ nasal congestion, post nasal drip,   CV:  No chest pain,  Orthopnea, PND, swelling  in lower extremities, anasarca, dizziness, palpitations, syncope.   GI  No heartburn, indigestion, abdominal pain, nausea, vomiting, diarrhea, change in bowel habits, loss of appetite, bloody stools.   Resp:   No chest wall deformity  Skin: no rash or lesions.  GU: no dysuria, change in color of urine, no urgency or frequency.  No flank pain, no hematuria   MS:  No joint pain or swelling.  No decreased range of motion.  No back pain.    Physical Exam  BP 130/80 (BP Location: Left Arm, Patient Position: Sitting, Cuff Size: Large)    Pulse 85    Temp 98.5 F (36.9 C) (Oral)    Ht 5\' 7"  (1.702 m)    Wt 220 lb 12.8 oz (100.2 kg)    SpO2 96%    BMI 34.58 kg/m   GEN: A/Ox3; pleasant , NAD, well nourished    HEENT:  /AT,   NOSE-clear, THROAT-clear, no lesions, no postnasal drip or exudate noted.   NECK:  Supple w/ fair ROM; no JVD; normal carotid impulses w/o bruits; no thyromegaly or nodules palpated; no lymphadenopathy.    RESP  scattered rhonchi  no accessory muscle use, no dullness to percussion  CARD:  RRR, no m/r/g, no peripheral edema, pulses intact, no cyanosis or clubbing.  GI:   Soft & nt; nml bowel sounds; no organomegaly or masses detected.   Musco: Warm bil, no deformities or joint swelling noted.   Neuro: alert, no focal deficits noted.    Skin: Warm, no lesions or rashes    Lab Results:  CBC    BNP No  results found for: BNP  ProBNP No results found for: PROBNP  Imaging: No results found.    PFT Results Latest Ref Rng & Units 07/21/2014  FVC-Pre L 3.03  FVC-Predicted Pre % 81  FVC-Post L 3.66  FVC-Predicted Post % 98  Pre FEV1/FVC % % 72  Post FEV1/FCV % % 74  FEV1-Pre L 2.18  FEV1-Predicted Pre % 75  FEV1-Post L 2.70  DLCO uncorrected ml/min/mmHg 31.17  DLCO UNC% % 106  DLVA Predicted % 127  TLC L 5.97  TLC % Predicted % 91  RV % Predicted % 126    Lab Results  Component Value Date   NITRICOXIDE 213 12/07/2015        Assessment & Plan:   Acute asthmatic bronchitis Acute slow to resolve flare with recent influenza A  tx with abx and steroids  Check cxr   Plan  Patient Instructions  Chest xray today .  Zpack take as directed .  Prednisone taper over next week  Mucinex Twice daily  As needed  cough/congestion  Delsym 2 tsp Twice daily  for cough  as needed  Tessalon Three times a day for cough As needed   Begin Zyrtec 10mg  daily for 1 week then as needed  Saline nasal rinses Twice daily   Saline nasal gel At bedtime   Continue on Wixela and Incruse .  Albuterol inhaler /neb As needed   Sips of water to soothe throat. Avoid throat clearing . NO MINTS  Follow up with Dr. Craige Cotta in 2-3 months and As needed   Please contact office for sooner follow up if symptoms do not improve or worsen or seek emergency care    '     Verl Whitmore, NP 06/14/2021

## 2021-06-14 NOTE — Assessment & Plan Note (Signed)
Acute slow to resolve flare with recent influenza A  tx with abx and steroids  Check cxr   Plan  Patient Instructions  Chest xray today .  Zpack take as directed .  Prednisone taper over next week  Mucinex Twice daily  As needed  cough/congestion  Delsym 2 tsp Twice daily  for cough  as needed  Tessalon Three times a day for cough As needed   Begin Zyrtec 10mg  daily for 1 week then as needed  Saline nasal rinses Twice daily   Saline nasal gel At bedtime   Continue on Wixela and Incruse .  Albuterol inhaler /neb As needed   Sips of water to soothe throat. Avoid throat clearing . NO MINTS  Follow up with Dr. Halford Chessman in 2-3 months and As needed   Please contact office for sooner follow up if symptoms do not improve or worsen or seek emergency care    '

## 2021-06-14 NOTE — Progress Notes (Signed)
Called and spoke with patient, advised of results/recommendations per Tammy Parrett NP.  He verbalized understanding.  Nothing further needed.

## 2021-06-14 NOTE — Progress Notes (Signed)
Reviewed and agree with assessment/plan.   [name], MD [facility] 06/14/2021, 12:38 PM Pager:  [phone]

## 2021-06-16 ENCOUNTER — Ambulatory Visit (INDEPENDENT_AMBULATORY_CARE_PROVIDER_SITE_OTHER): Payer: TRICARE For Life (TFL) | Admitting: Physician Assistant

## 2021-06-16 ENCOUNTER — Encounter: Payer: Self-pay | Admitting: Physician Assistant

## 2021-06-16 ENCOUNTER — Other Ambulatory Visit: Payer: Self-pay

## 2021-06-16 DIAGNOSIS — Z9889 Other specified postprocedural states: Secondary | ICD-10-CM

## 2021-06-16 DIAGNOSIS — G5601 Carpal tunnel syndrome, right upper limb: Secondary | ICD-10-CM

## 2021-06-16 NOTE — Progress Notes (Signed)
Post-Op Visit Note   Patient: Douglas Carpenter           Date of Birth: 08-23-1955           MRN: [id number] Visit Date: 06/16/2021 PCP: [name], MD   Assessment & Plan:  Chief Complaint:  Chief Complaint  Patient presents with   Right Hand - Pain   Visit Diagnoses:  1. Right carpal tunnel syndrome   2. S/P carpal tunnel release     Plan: Patient is a pleasant 65 year old gentleman who comes in today 2 weeks status post right carpal tunnel release 06/03/2021.  He has been doing great.  No complaints of pain or paresthesias.  Examination of the right hand reveals a well-healed surgical incision with nylon sutures in place.  No evidence of infection or cellulitis.  Fingers warm well perfused.  Today, sutures were removed and Steri-Strips applied.  No heavy lifting or submerging his hand underwater for another 2 weeks.  He will continue to work on range of motion exercises.  Follow-up with [place] in 4 weeks time for recheck.  Call with concerns or questions in meantime.  Follow-Up Instructions: Return in about 4 weeks (around 07/14/2021).   Orders:  No orders of the defined types were placed in this encounter.  No orders of the defined types were placed in this encounter.   Imaging: No new imaging  PMFS History: Patient Active Problem List   Diagnosis Date Noted   Acute asthmatic bronchitis 06/14/2021   Other headache syndrome 05/17/2021   Body aches 05/17/2021   Acute cough 05/17/2021   Shortness of breath 05/17/2021   Influenza A 05/17/2021   Arthritis of carpometacarpal (CMC) joint of right thumb 04/05/2021   Bilateral hand numbness 04/05/2021   Type II diabetes mellitus with manifestations ([place]) 03/18/2021   Diuretic-induced hypokalemia 10/22/2020   Encounter for general adult medical examination with abnormal findings 08/05/2020   Rising PSA level 08/04/2020   Stage 3a chronic kidney disease ([place]) 08/04/2020   Prediabetes 08/03/2020   Primary hypertension  08/03/2020   Benign prostatic hyperplasia without lower urinary tract symptoms 08/03/2020   Hyperlipidemia with target LDL less than 130 08/03/2020   Primary osteoarthritis of left knee 04/16/2019   Primary osteoarthritis of right knee 02/20/2019   Exercise-induced asthma 07/21/2014   Chronic cough 07/21/2014   Upper airway cough syndrome 07/21/2014   Allergic rhinitis 07/21/2014   Lumbar stenosis 11/06/2013   Urethral stricture 01/07/2013   VOCAL CORD DISORDER 02/19/2009   Asthma 01/27/2009   GERD 01/27/2009   Past Medical History:  Diagnosis Date   Allergy    Arthritis    "everywhere"   BPH (benign prostatic hypertrophy)    Chronic rhinitis 01/27/2009   ED (erectile dysfunction)    GERD (gastroesophageal reflux disease)    Headache(784.0)    migraines, as many as 3 in a week     HYPERLIPIDEMIA 01/27/2009   HYPERTENSION 01/27/2009   Mild asthma    seasonal allergies, uses Proair once per yr.    Mild obstructive sleep apnea    per pt -- mild osa test result--  no rx cpap needed   Neuromuscular disorder (HCC)    Sleep apnea    no cpap   SOB (shortness of breath)    Testosterone deficiency    Urethral stricture    Vocal cord anomaly    pt states told from [place]  doctor heard a little gurgle sound -- no farther testing done  Family History  Problem Relation Age of Onset   Cancer Mother        lung   Lung cancer Mother    Heart disease Maternal Grandmother    Heart disease Maternal Grandfather    Cancer Maternal Grandfather        colon, prostate,lung   Colon cancer Neg Hx    Colon polyps Neg Hx    Esophageal cancer Neg Hx    Rectal cancer Neg Hx    Stomach cancer Neg Hx     Past Surgical History:  Procedure Laterality Date   BACK SURGERY     COLONOSCOPY     CYSTOSCOPY WITH URETHRAL DILATATION N/A 01/07/2013   Procedure: CYSTOSCOPY WITH URETHRAL DILATATION BALLOON DILATION ;  Surgeon: Bernestine Amass, MD;  Location: [facility];  Service: Urology;   Laterality: N/A;   LUMBAR LAMINECTOMY/DECOMPRESSION MICRODISCECTOMY Right 11/29/2012   Procedure: Right Lumbar Four to Five Laminectomy/ Decompression Microdiskectomy;  Surgeon: Otilio Connors, MD;  Location: [facility];  Service: Neurosurgery;  Laterality: Right;  LUMBAR LAMINECTOMY/DECOMPRESSION MICRODISCECTOMY 1 LEVEL   POLYPECTOMY     SHOULDER ARTHROSCOPY W/ ACROMIAL REPAIR Left 08-28-2002   TOTAL KNEE ARTHROPLASTY Left 12/02/2019   TOTAL KNEE ARTHROPLASTY Left 12/02/2019   Procedure: LEFT TOTAL KNEE ARTHROPLASTY;  Surgeon: Leandrew Koyanagi, MD;  Location: [facility];  Service: Orthopedics;  Laterality: Left;   UPPER GASTROINTESTINAL ENDOSCOPY     Social History   Occupational History   Occupation: retired  Tobacco Use   Smoking status: Former    Packs/day: 0.50    Years: 22.00    Pack years: 11.00    Types: Cigarettes    Quit date: 06/27/1988    Years since quitting: 32.9   Smokeless tobacco: Never  Vaping Use   Vaping Use: Never used  Substance and Sexual Activity   Alcohol use: Yes    Alcohol/week: 21.0 standard drinks    Types: 21 Shots of liquor per week    Comment: cognac   Drug use: No   Sexual activity: Yes    Partners: Female

## 2021-06-16 NOTE — Progress Notes (Deleted)
Post-Op Visit Note   Patient: Douglas Carpenter           Date of Birth: 1956/06/18           MRN: [id number] Visit Date: 06/16/2021 PCP: [name], MD   Assessment & Plan:  Chief Complaint:  Chief Complaint  Patient presents with   Right Hand - Pain   Visit Diagnoses:  1. Right carpal tunnel syndrome   2. S/P carpal tunnel release     Plan: Patient is a pleasant 65 year old gentleman who comes in today 2 weeks status post right carpal tunnel release 06/03/2021.  He has been doing great.  No complaints of pain or paresthesias.  Examination of the right hand reveals a well-healed surgical incision with nylon sutures in place.  No evidence of infection or cellulitis.  Fingers warm well perfused.  Today, sutures were removed and Steri-Strips applied.  No heavy lifting or submerging his hand underwater for another 2 weeks.  He will continue to work on range of motion exercises.  Follow-up with [place] in 4 weeks time for recheck.  Call with concerns or questions in meantime.  Follow-Up Instructions: Return in about 4 weeks (around 07/14/2021).   Orders:  No orders of the defined types were placed in this encounter.  No orders of the defined types were placed in this encounter.   Imaging: No new imaging  PMFS History: Patient Active Problem List   Diagnosis Date Noted   Acute asthmatic bronchitis 06/14/2021   Other headache syndrome 05/17/2021   Body aches 05/17/2021   Acute cough 05/17/2021   Shortness of breath 05/17/2021   Influenza A 05/17/2021   Arthritis of carpometacarpal (CMC) joint of right thumb 04/05/2021   Bilateral hand numbness 04/05/2021   Type II diabetes mellitus with manifestations ([place]) 03/18/2021   Diuretic-induced hypokalemia 10/22/2020   Encounter for general adult medical examination with abnormal findings 08/05/2020   Rising PSA level 08/04/2020   Stage 3a chronic kidney disease ([place]) 08/04/2020   Prediabetes 08/03/2020   Primary hypertension  08/03/2020   Benign prostatic hyperplasia without lower urinary tract symptoms 08/03/2020   Hyperlipidemia with target LDL less than 130 08/03/2020   Primary osteoarthritis of left knee 04/16/2019   Primary osteoarthritis of right knee 02/20/2019   Exercise-induced asthma 07/21/2014   Chronic cough 07/21/2014   Upper airway cough syndrome 07/21/2014   Allergic rhinitis 07/21/2014   Lumbar stenosis 11/06/2013   Urethral stricture 01/07/2013   VOCAL CORD DISORDER 02/19/2009   Asthma 01/27/2009   GERD 01/27/2009   Past Medical History:  Diagnosis Date   Allergy    Arthritis    "everywhere"   BPH (benign prostatic hypertrophy)    Chronic rhinitis 01/27/2009   ED (erectile dysfunction)    GERD (gastroesophageal reflux disease)    Headache(784.0)    migraines, as many as 3 in a week     HYPERLIPIDEMIA 01/27/2009   HYPERTENSION 01/27/2009   Mild asthma    seasonal allergies, uses Proair once per yr.    Mild obstructive sleep apnea    per pt -- mild osa test result--  no rx cpap needed   Neuromuscular disorder (HCC)    Sleep apnea    no cpap   SOB (shortness of breath)    Testosterone deficiency    Urethral stricture    Vocal cord anomaly    pt states told from [place]  doctor heard a little gurgle sound -- no farther testing done  Family History  Problem Relation Age of Onset   Cancer Mother        lung   Lung cancer Mother    Heart disease Maternal Grandmother    Heart disease Maternal Grandfather    Cancer Maternal Grandfather        colon, prostate,lung   Colon cancer Neg Hx    Colon polyps Neg Hx    Esophageal cancer Neg Hx    Rectal cancer Neg Hx    Stomach cancer Neg Hx     Past Surgical History:  Procedure Laterality Date   BACK SURGERY     COLONOSCOPY     CYSTOSCOPY WITH URETHRAL DILATATION N/A 01/07/2013   Procedure: CYSTOSCOPY WITH URETHRAL DILATATION BALLOON DILATION ;  Surgeon: Bernestine Amass, MD;  Location: [facility];  Service: Urology;   Laterality: N/A;   LUMBAR LAMINECTOMY/DECOMPRESSION MICRODISCECTOMY Right 11/29/2012   Procedure: Right Lumbar Four to Five Laminectomy/ Decompression Microdiskectomy;  Surgeon: Otilio Connors, MD;  Location: [place] [facility];  Service: Neurosurgery;  Laterality: Right;  LUMBAR LAMINECTOMY/DECOMPRESSION MICRODISCECTOMY 1 LEVEL   POLYPECTOMY     SHOULDER ARTHROSCOPY W/ ACROMIAL REPAIR Left 08-28-2002   TOTAL KNEE ARTHROPLASTY Left 12/02/2019   TOTAL KNEE ARTHROPLASTY Left 12/02/2019   Procedure: LEFT TOTAL KNEE ARTHROPLASTY;  Surgeon: Leandrew Koyanagi, MD;  Location: [place];  Service: Orthopedics;  Laterality: Left;   UPPER GASTROINTESTINAL ENDOSCOPY     Social History   Occupational History   Occupation: retired  Tobacco Use   Smoking status: Former    Packs/day: 0.50    Years: 22.00    Pack years: 11.00    Types: Cigarettes    Quit date: 06/27/1988    Years since quitting: 32.9   Smokeless tobacco: Never  Vaping Use   Vaping Use: Never used  Substance and Sexual Activity   Alcohol use: Yes    Alcohol/week: 21.0 standard drinks    Types: 21 Shots of liquor per week    Comment: cognac   Drug use: No   Sexual activity: Yes    Partners: Female      Post-Op Visit Note   Patient: Douglas Carpenter           Date of Birth: 1955/12/27           MRN: [id number] Visit Date: 06/16/2021 PCP: [name], MD   Assessment & Plan:  Chief Complaint:  Chief Complaint  Patient presents with   Right Hand - Pain   Visit Diagnoses:  1. Right carpal tunnel syndrome   2. S/P carpal tunnel release     Plan: ***  Follow-Up Instructions: Return in about 4 weeks (around 07/14/2021).   Orders:  No orders of the defined types were placed in this encounter.  No orders of the defined types were placed in this encounter.   Imaging: No results found.  PMFS History: Patient Active Problem List   Diagnosis Date Noted   Acute asthmatic bronchitis 06/14/2021   Other headache syndrome 05/17/2021    Body aches 05/17/2021   Acute cough 05/17/2021   Shortness of breath 05/17/2021   Influenza A 05/17/2021   Arthritis of carpometacarpal [facility]) joint of right thumb 04/05/2021   Bilateral hand numbness 04/05/2021   Type II diabetes mellitus with manifestations ([place]) 03/18/2021   Diuretic-induced hypokalemia 10/22/2020   Encounter for general adult medical examination with abnormal findings 08/05/2020   Rising PSA level 08/04/2020   Stage 3a chronic kidney disease ([place])  08/04/2020   Prediabetes 08/03/2020   Primary hypertension 08/03/2020   Benign prostatic hyperplasia without lower urinary tract symptoms 08/03/2020   Hyperlipidemia with target LDL less than 130 08/03/2020   Primary osteoarthritis of left knee 04/16/2019   Primary osteoarthritis of right knee 02/20/2019   Exercise-induced asthma 07/21/2014   Chronic cough 07/21/2014   Upper airway cough syndrome 07/21/2014   Allergic rhinitis 07/21/2014   Lumbar stenosis 11/06/2013   Urethral stricture 01/07/2013   VOCAL CORD DISORDER 02/19/2009   Asthma 01/27/2009   GERD 01/27/2009   Past Medical History:  Diagnosis Date   Allergy    Arthritis    "everywhere"   BPH (benign prostatic hypertrophy)    Chronic rhinitis 01/27/2009   ED (erectile dysfunction)    GERD (gastroesophageal reflux disease)    Headache(784.0)    migraines, as many as 3 in a week     HYPERLIPIDEMIA 01/27/2009   HYPERTENSION 01/27/2009   Mild asthma    seasonal allergies, uses Proair once per yr.    Mild obstructive sleep apnea    per pt -- mild osa test result--  no rx cpap needed   Neuromuscular disorder (HCC)    Sleep apnea    no cpap   SOB (shortness of breath)    Testosterone deficiency    Urethral stricture    Vocal cord anomaly    pt states told from [place]  doctor heard a little gurgle sound -- no farther testing done    Family History  Problem Relation Age of Onset   Cancer Mother        lung   Lung cancer Mother    Heart disease  Maternal Grandmother    Heart disease Maternal Grandfather    Cancer Maternal Grandfather        colon, prostate,lung   Colon cancer Neg Hx    Colon polyps Neg Hx    Esophageal cancer Neg Hx    Rectal cancer Neg Hx    Stomach cancer Neg Hx     Past Surgical History:  Procedure Laterality Date   BACK SURGERY     COLONOSCOPY     CYSTOSCOPY WITH URETHRAL DILATATION N/A 01/07/2013   Procedure: CYSTOSCOPY WITH URETHRAL DILATATION BALLOON DILATION ;  Surgeon: Bernestine Amass, MD;  Location: [facility];  Service: Urology;  Laterality: N/A;   LUMBAR LAMINECTOMY/DECOMPRESSION MICRODISCECTOMY Right 11/29/2012   Procedure: Right Lumbar Four to Five Laminectomy/ Decompression Microdiskectomy;  Surgeon: Otilio Connors, MD;  Location: [place] [facility];  Service: Neurosurgery;  Laterality: Right;  LUMBAR LAMINECTOMY/DECOMPRESSION MICRODISCECTOMY 1 LEVEL   POLYPECTOMY     SHOULDER ARTHROSCOPY W/ ACROMIAL REPAIR Left 08-28-2002   TOTAL KNEE ARTHROPLASTY Left 12/02/2019   TOTAL KNEE ARTHROPLASTY Left 12/02/2019   Procedure: LEFT TOTAL KNEE ARTHROPLASTY;  Surgeon: Leandrew Koyanagi, MD;  Location: [place];  Service: Orthopedics;  Laterality: Left;   UPPER GASTROINTESTINAL ENDOSCOPY     Social History   Occupational History   Occupation: retired  Tobacco Use   Smoking status: Former    Packs/day: 0.50    Years: 22.00    Pack years: 11.00    Types: Cigarettes    Quit date: 06/27/1988    Years since quitting: 32.9   Smokeless tobacco: Never  Vaping Use   Vaping Use: Never used  Substance and Sexual Activity   Alcohol use: Yes    Alcohol/week: 21.0 standard drinks    Types: 21 Shots of liquor per week  Comment: cognac   Drug use: No   Sexual activity: Yes    Partners: Female

## 2021-06-20 IMAGING — DX DG CHEST 2V
2 series · 2 of 2 positions shown · non-contrast
Comparison: 08/03/2020

CLINICAL DATA: 64-year-old male with shortness of breath

EXAM:
CHEST - 2 VIEW

[chest pa]
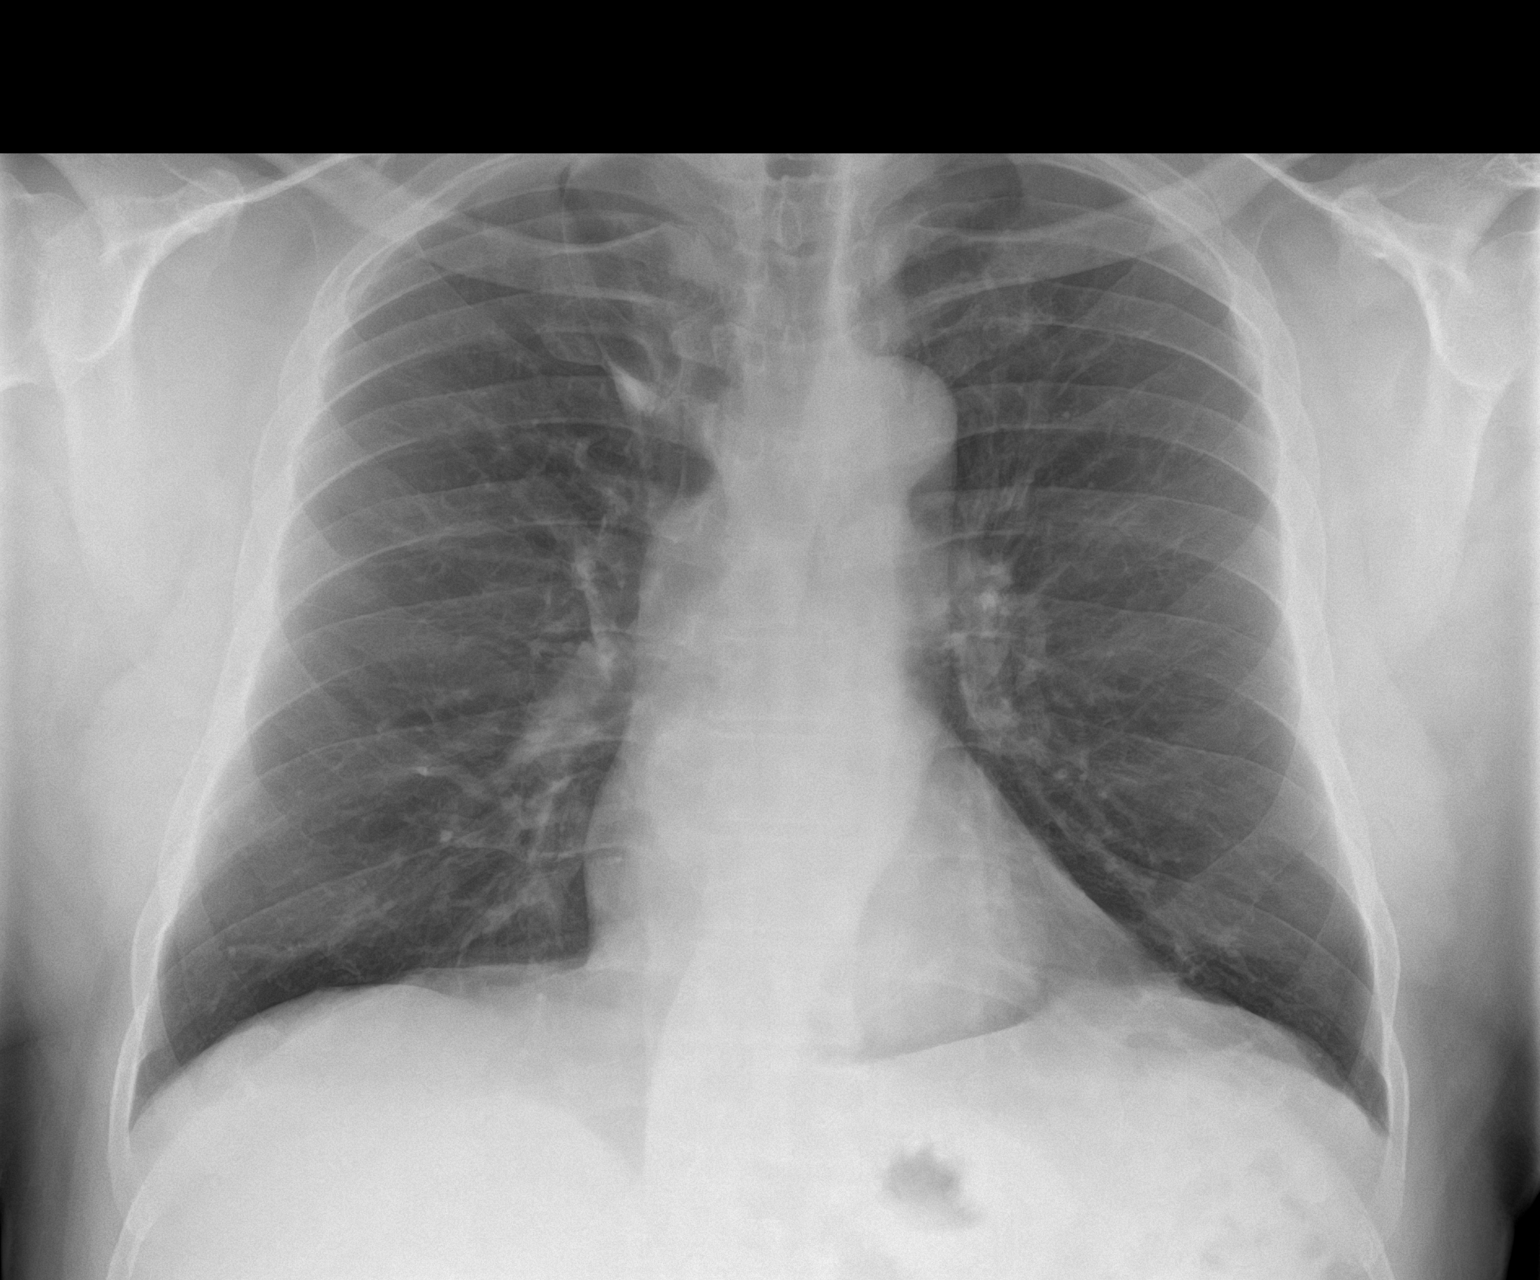

[chest lat]
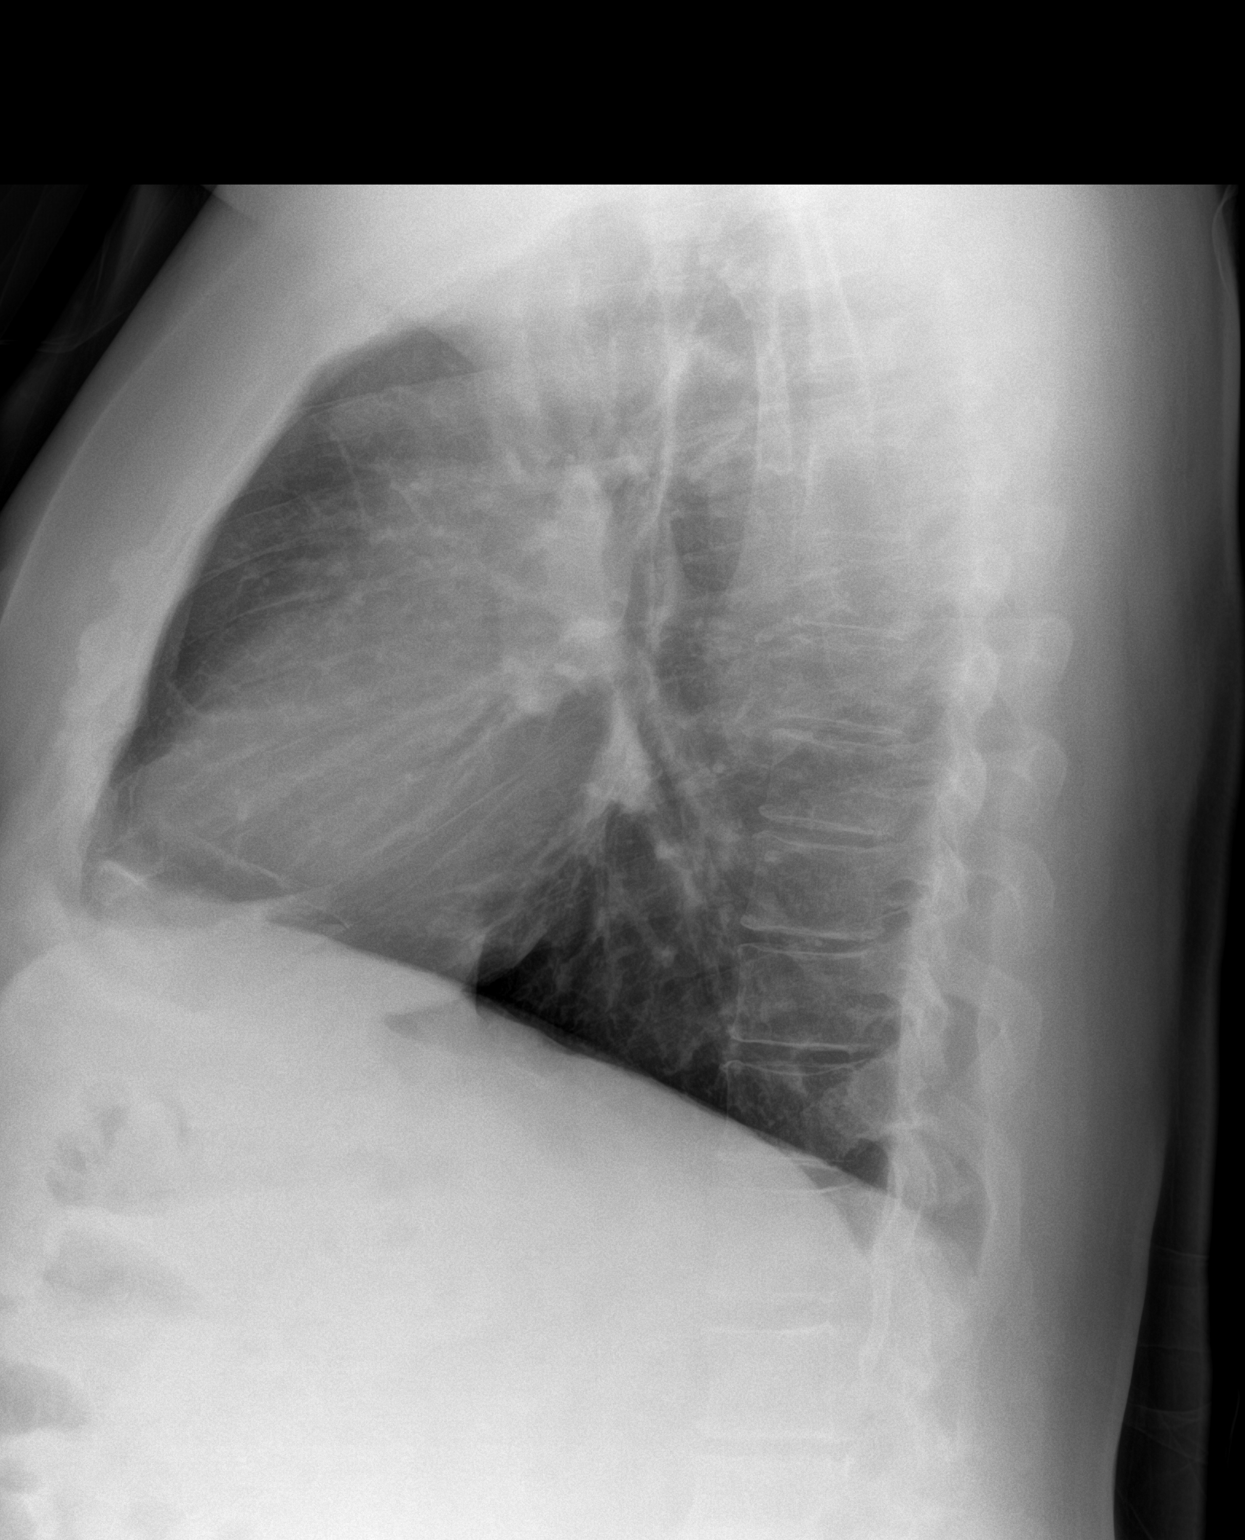

[2 of 2 positions shown; findings below may reference images not displayed]

FINDINGS: Cardiomediastinal silhouette unchanged in size and contour. No
evidence of central vascular congestion. No interlobular septal
thickening.

Incidental azygos lobe.

No pneumothorax or pleural effusion.  No confluent airspace disease.

Stigmata of emphysema, with increased retrosternal airspace,
flattened hemidiaphragms, increased AP diameter, and hyperinflation
on the AP view.

No displaced fracture
IMPRESSION: Negative for acute cardiopulmonary disease

## 2021-07-06 ENCOUNTER — Other Ambulatory Visit: Payer: Self-pay

## 2021-07-06 ENCOUNTER — Telehealth: Payer: Medicare PPO | Admitting: Family Medicine

## 2021-07-07 DIAGNOSIS — M5414 Radiculopathy, thoracic region: Secondary | ICD-10-CM | POA: Diagnosis not present

## 2021-07-07 DIAGNOSIS — M47816 Spondylosis without myelopathy or radiculopathy, lumbar region: Secondary | ICD-10-CM | POA: Diagnosis not present

## 2021-07-07 DIAGNOSIS — M461 Sacroiliitis, not elsewhere classified: Secondary | ICD-10-CM | POA: Diagnosis not present

## 2021-07-07 DIAGNOSIS — M5134 Other intervertebral disc degeneration, thoracic region: Secondary | ICD-10-CM | POA: Diagnosis not present

## 2021-07-08 DIAGNOSIS — Z03818 Encounter for observation for suspected exposure to other biological agents ruled out: Secondary | ICD-10-CM | POA: Diagnosis not present

## 2021-07-08 DIAGNOSIS — J309 Allergic rhinitis, unspecified: Secondary | ICD-10-CM | POA: Diagnosis not present

## 2021-07-08 DIAGNOSIS — Z20822 Contact with and (suspected) exposure to covid-19: Secondary | ICD-10-CM | POA: Diagnosis not present

## 2021-07-09 ENCOUNTER — Other Ambulatory Visit: Payer: Self-pay | Admitting: Internal Medicine

## 2021-07-09 DIAGNOSIS — I10 Essential (primary) hypertension: Secondary | ICD-10-CM

## 2021-07-09 DIAGNOSIS — T502X5A Adverse effect of carbonic-anhydrase inhibitors, benzothiadiazides and other diuretics, initial encounter: Secondary | ICD-10-CM

## 2021-07-09 DIAGNOSIS — E876 Hypokalemia: Secondary | ICD-10-CM

## 2021-07-14 ENCOUNTER — Ambulatory Visit (INDEPENDENT_AMBULATORY_CARE_PROVIDER_SITE_OTHER): Payer: Medicare PPO | Admitting: Orthopaedic Surgery

## 2021-07-14 ENCOUNTER — Encounter: Payer: Self-pay | Admitting: Orthopaedic Surgery

## 2021-07-14 ENCOUNTER — Other Ambulatory Visit: Payer: Self-pay

## 2021-07-14 DIAGNOSIS — M1711 Unilateral primary osteoarthritis, right knee: Secondary | ICD-10-CM | POA: Diagnosis not present

## 2021-07-14 DIAGNOSIS — Z9889 Other specified postprocedural states: Secondary | ICD-10-CM

## 2021-07-14 MED ORDER — LIDOCAINE HCL 1 % IJ SOLN
2.0000 mL | INTRAMUSCULAR | Status: AC | PRN
  Administered 2021-07-14: 2 mL

## 2021-07-14 MED ORDER — BUPIVACAINE HCL 0.5 % IJ SOLN
2.0000 mL | INTRAMUSCULAR | Status: AC | PRN
  Administered 2021-07-14: 2 mL via INTRA_ARTICULAR

## 2021-07-14 MED ORDER — METHYLPREDNISOLONE ACETATE 40 MG/ML IJ SUSP
40.0000 mg | INTRAMUSCULAR | Status: AC | PRN
  Administered 2021-07-14: 40 mg via INTRA_ARTICULAR

## 2021-07-14 NOTE — Progress Notes (Signed)
Office Visit Note   Patient: Douglas Carpenter           Date of Birth: 1955/12/29           MRN: [id number] Visit Date: 07/14/2021              Requested by: [name], MD [address] [place],  [place] [address] PCP: [name], MD   Assessment & Plan: Visit Diagnoses:  1. Primary osteoarthritis of right knee   2. S/P carpal tunnel release     Plan: [name] returns today 6 weeks status post right carpal tunnel release.  He is doing well and reports significant improvement in symptoms specially in the shooting pain.  He has no complaints in this regard.  He is requesting cortisone injection for the right knee today.  His last one was 4 months ago.  Right hand shows full healed surgical scar.  He is able to make a full composite fist.  No pain to palpation of the surgical scar. Right knee examination is stable.  In terms of the right hand that he can continue to increase use and activity as tolerated.  For the right knee we repeated the cortisone injection today.  We will see him back as needed.  Follow-Up Instructions: No follow-ups on file.   Orders:  No orders of the defined types were placed in this encounter.  No orders of the defined types were placed in this encounter.     Procedures: Large Joint Inj: R knee on 07/14/2021 10:28 AM Indications: pain Details: 22 G needle  Arthrogram: No  Medications: 40 mg methylPREDNISolone acetate 40 MG/ML; 2 mL lidocaine 1 %; 2 mL bupivacaine 0.5 % Consent was given by the patient. Patient was prepped and draped in the usual sterile fashion.      Clinical Data: No additional findings.   Subjective: Chief Complaint  Patient presents with   Right Hand - Pain    HPI  Review of Systems   Objective: Vital Signs: There were no vitals taken for this visit.  Physical Exam  Ortho Exam  Specialty Comments:  No specialty comments available.  Imaging: No results found.   PMFS History: Patient Active  Problem List   Diagnosis Date Noted   Acute asthmatic bronchitis 06/14/2021   Other headache syndrome 05/17/2021   Body aches 05/17/2021   Acute cough 05/17/2021   Shortness of breath 05/17/2021   Influenza A 05/17/2021   Arthritis of carpometacarpal (CMC) joint of right thumb 04/05/2021   Bilateral hand numbness 04/05/2021   Type II diabetes mellitus with manifestations ([place]) 03/18/2021   Diuretic-induced hypokalemia 10/22/2020   Encounter for general adult medical examination with abnormal findings 08/05/2020   Rising PSA level 08/04/2020   Stage 3a chronic kidney disease ([place]) 08/04/2020   Prediabetes 08/03/2020   Primary hypertension 08/03/2020   Benign prostatic hyperplasia without lower urinary tract symptoms 08/03/2020   Hyperlipidemia with target LDL less than 130 08/03/2020   Primary osteoarthritis of left knee 04/16/2019   Primary osteoarthritis of right knee 02/20/2019   Exercise-induced asthma 07/21/2014   Chronic cough 07/21/2014   Upper airway cough syndrome 07/21/2014   Allergic rhinitis 07/21/2014   Lumbar stenosis 11/06/2013   Urethral stricture 01/07/2013   VOCAL CORD DISORDER 02/19/2009   Asthma 01/27/2009   GERD 01/27/2009   Past Medical History:  Diagnosis Date   Allergy    Arthritis    "everywhere"   BPH (benign prostatic hypertrophy)  Chronic rhinitis 01/27/2009   ED (erectile dysfunction)    GERD (gastroesophageal reflux disease)    Headache(784.0)    migraines, as many as 3 in a week     HYPERLIPIDEMIA 01/27/2009   HYPERTENSION 01/27/2009   Mild asthma    seasonal allergies, uses Proair once per yr.    Mild obstructive sleep apnea    per pt -- mild osa test result--  no rx cpap needed   Neuromuscular disorder (HCC)    Sleep apnea    no cpap   SOB (shortness of breath)    Testosterone deficiency    Urethral stricture    Vocal cord anomaly    pt states told from [place]  doctor heard a little gurgle sound -- no farther testing done     Family History  Problem Relation Age of Onset   Cancer Mother        lung   Lung cancer Mother    Heart disease Maternal Grandmother    Heart disease Maternal Grandfather    Cancer Maternal Grandfather        colon, prostate,lung   Colon cancer Neg Hx    Colon polyps Neg Hx    Esophageal cancer Neg Hx    Rectal cancer Neg Hx    Stomach cancer Neg Hx     Past Surgical History:  Procedure Laterality Date   BACK SURGERY     COLONOSCOPY     CYSTOSCOPY WITH URETHRAL DILATATION N/A 01/07/2013   Procedure: CYSTOSCOPY WITH URETHRAL DILATATION BALLOON DILATION ;  Surgeon: Bernestine Amass, MD;  Location: [facility];  Service: Urology;  Laterality: N/A;   LUMBAR LAMINECTOMY/DECOMPRESSION MICRODISCECTOMY Right 11/29/2012   Procedure: Right Lumbar Four to Five Laminectomy/ Decompression Microdiskectomy;  Surgeon: Otilio Connors, MD;  Location: [place] [facility];  Service: Neurosurgery;  Laterality: Right;  LUMBAR LAMINECTOMY/DECOMPRESSION MICRODISCECTOMY 1 LEVEL   POLYPECTOMY     SHOULDER ARTHROSCOPY W/ ACROMIAL REPAIR Left 08-28-2002   TOTAL KNEE ARTHROPLASTY Left 12/02/2019   TOTAL KNEE ARTHROPLASTY Left 12/02/2019   Procedure: LEFT TOTAL KNEE ARTHROPLASTY;  Surgeon: Leandrew Koyanagi, MD;  Location: [place];  Service: Orthopedics;  Laterality: Left;   UPPER GASTROINTESTINAL ENDOSCOPY     Social History   Occupational History   Occupation: retired  Tobacco Use   Smoking status: Former    Packs/day: 0.50    Years: 22.00    Pack years: 11.00    Types: Cigarettes    Quit date: 06/27/1988    Years since quitting: 33.0   Smokeless tobacco: Never  Vaping Use   Vaping Use: Never used  Substance and Sexual Activity   Alcohol use: Yes    Alcohol/week: 21.0 standard drinks    Types: 21 Shots of liquor per week    Comment: cognac   Drug use: No   Sexual activity: Yes    Partners: Female

## 2021-08-16 DIAGNOSIS — M461 Sacroiliitis, not elsewhere classified: Secondary | ICD-10-CM | POA: Diagnosis not present

## 2021-08-19 ENCOUNTER — Encounter: Payer: Self-pay | Admitting: Pulmonary Disease

## 2021-08-19 ENCOUNTER — Ambulatory Visit (INDEPENDENT_AMBULATORY_CARE_PROVIDER_SITE_OTHER): Payer: Medicare PPO | Admitting: Pulmonary Disease

## 2021-08-19 ENCOUNTER — Other Ambulatory Visit: Payer: Self-pay

## 2021-08-19 VITALS — BP 134/86 | HR 85 | Temp 98.4°F | Ht 67.0 in | Wt 226.0 lb

## 2021-08-19 DIAGNOSIS — J301 Allergic rhinitis due to pollen: Secondary | ICD-10-CM | POA: Diagnosis not present

## 2021-08-19 DIAGNOSIS — J4540 Moderate persistent asthma, uncomplicated: Secondary | ICD-10-CM | POA: Diagnosis not present

## 2021-08-19 DIAGNOSIS — H1013 Acute atopic conjunctivitis, bilateral: Secondary | ICD-10-CM

## 2021-08-19 MED ORDER — FLUTICASONE-SALMETEROL 250-50 MCG/ACT IN AEPB: 1.0000 | INHALATION_SPRAY | Freq: Two times a day (BID) | RESPIRATORY_TRACT | 3 refills | Status: AC

## 2021-08-19 NOTE — Progress Notes (Signed)
[facility]  Chief Complaint  Patient presents with   Follow-up   Constitutional:  BP 134/86 (BP Location: Right Arm, Patient Position: Sitting, Cuff Size: Normal)    Pulse 85    Temp 98.4 F (36.9 C) (Oral)    Ht 5\' 7"  (1.702 m)    Wt 226 lb (102.5 kg)    SpO2 96%    BMI 35.40 kg/m   Past Medical History:  Low T, HTN, HLD, HA, GERD, BPH, OA, COVID 19 infection January 2022  Past Surgical History:  He  has a past surgical history that includes Lumbar laminectomy/decompression microdiscectomy (Right, 11/29/2012); Shoulder arthroscopy w/ acromial repair (Left, 08-28-2002); Cystoscopy with urethral dilatation (N/A, 01/07/2013); Colonoscopy; Polypectomy; Upper gastrointestinal endoscopy; Back surgery; Total knee arthroplasty (Left, 12/02/2019); and Total knee arthroplasty (Left, 12/02/2019).  Brief Summary:  Douglas Carpenter is a 66 y.o. male former smoker with chronic cough in setting of Allergic asthma, Post nasal drip, GERD, and vocal cord disease.  He also has hx of snoring.      Subjective:   He is here with his wife.  He saw Tammy Parrett in December for acute exacerbation after getting influenza A.  Treated with prednisone and Zpak.  Chest xray from 06/14/21 was negative for pneumonia.  He felt better after.  Started having trouble again last week when weather changed.  Started using wixela bid again and feels better.  More recently not having cough, wheeze, chest pain, runny nose, or eye irritation.  Sleeping okay.  Physical Exam:   Appearance - well kempt   ENMT - no sinus tenderness, no oral exudate, no LAN, Mallampati 3 airway, no stridor  Respiratory - equal breath sounds bilaterally, no wheezing or rales  CV - s1s2 regular rate and rhythm, no murmurs  Ext - no clubbing, no edema  Skin - no rashes  Psych - normal mood and affect     Pulmonary testing:  PFT 02/19/09 >> FEV1 2.90 (90%), FEV1% 70, TLC 5.62(93%), DLCO 99%,  +BD PFT 07/21/14 >> FEV1 2.70 (93%), FEV1% 74, TLC 5.97 (91%), RV 2.65 (126%), DLCO 106%, +BD RAST 07/21/14 >> react to dust mites, grasses; IgE 49 FeNO 10/27/15 >> 228  Sleep Tests:  HST 03/07/13 >> AHI 3.9, SaO2 low 80%  Cardiac Tests:  Echo 12/14/20 >> EF 55 to 60%  Social History:  He  reports that he quit smoking about 33 years ago. His smoking use included cigarettes. He has a 11.00 pack-year smoking history. He has never used smokeless tobacco. He reports current alcohol use of about 21.0 standard drinks per week. He reports that he does not use drugs.  Family History:  His family history includes Cancer in his maternal grandfather and mother; Heart disease in his maternal grandfather and maternal grandmother; Lung cancer in his mother.     Assessment/Plan:   Allergic asthma with exercise induced bronchoconstriction. - he gets scripts from the [place] - he has frequent exacerbations, and these happen typical with onset of Fall and Spring - continue incruse, wixela, singulair - prn albuterol  Upper airway cough syndrome. - continue singulair with prn claritin   Allergic conjunctivitis. - prn pataday   Time Spent Involved in Patient Care on Day of Examination:  26 minutes  Follow up:   Patient Instructions  Follow up in 6 months  Medication List:   Allergies as of 08/19/2021       Reactions   Aspirin Rash   Pt  can tolerate low doses-- INTOLERANT TO HIGHER DOSES        Medication List        Accurate as of August 19, 2021  1:58 PM. If you have any questions, ask your nurse or doctor.          STOP taking these medications    benzonatate 200 MG capsule Commonly known as: TESSALON Stopped by: [name], MD   ondansetron 4 MG tablet Commonly known as: Zofran Stopped by: [name], MD   predniSONE 10 MG tablet Commonly known as: DELTASONE Stopped by: [name], MD       TAKE these medications    Aimovig 70 MG/ML Soaj Generic drug:  Erenumab-aooe Inject 70 mg into the skin every 30 (thirty) days.   albuterol 108 (90 Base) MCG/ACT inhaler Commonly known as: ProAir HFA Inhale 2 puffs into the lungs every 6 (six) hours as needed for wheezing or shortness of breath.   albuterol (2.5 MG/3ML) 0.083% nebulizer solution Commonly known as: PROVENTIL Take 3 mLs (2.5 mg total) by nebulization every 6 (six) hours as needed for wheezing or shortness of breath.   aspirin EC 81 MG tablet Take 1 tablet (81 mg total) by mouth 2 (two) times daily.   atorvastatin 80 MG tablet Commonly known as: LIPITOR Take 1 tablet (80 mg total) by mouth at bedtime.   carvedilol 12.5 MG tablet Commonly known as: COREG Take 1 tablet (12.5 mg total) by mouth 2 (two) times daily.   diclofenac Sodium 1 % Gel Commonly known as: Voltaren Apply 2 g topically 4 (four) times daily.   DULoxetine 60 MG capsule Commonly known as: Cymbalta Take 1 capsule (60 mg total) by mouth daily.   fluticasone-salmeterol 250-50 MCG/ACT Aepb Commonly known as: Wixela Inhub Inhale 1 puff into the lungs in the morning and at bedtime.   gabapentin 600 MG tablet Commonly known as: NEURONTIN Take 600 mg by mouth 3 (three) times daily.   Incruse Ellipta 62.5 MCG/ACT Aepb Generic drug: umeclidinium bromide Inhale 1 puff into the lungs daily.   indapamide 2.5 MG tablet Commonly known as: LOZOL TAKE 1 TABLET BY MOUTH EVERY DAY   Klor-Con M20 20 MEQ tablet Generic drug: potassium chloride SA TAKE 1 TABLET BY MOUTH TWICE A DAY   montelukast 10 MG tablet Commonly known as: SINGULAIR Take 1 tablet (10 mg total) by mouth at bedtime.   olopatadine 0.1 % ophthalmic solution Commonly known as: Pataday Place 1 drop into both eyes 2 (two) times daily.   omeprazole 40 MG capsule Commonly known as: PRILOSEC Take 40 mg by mouth daily.   tobramycin 0.3 % ophthalmic solution Commonly known as: TOBREX Place 1 drop into the right eye every 6 (six) hours.         Signature:  [name], MD [place] Pager - [phone] 08/19/2021, 1:58 PM

## 2021-08-19 NOTE — Patient Instructions (Signed)
Follow up in 6 months 

## 2021-09-03 ENCOUNTER — Other Ambulatory Visit: Payer: Self-pay | Admitting: Internal Medicine

## 2021-09-03 ENCOUNTER — Telehealth: Payer: Self-pay | Admitting: Internal Medicine

## 2021-09-03 DIAGNOSIS — I10 Essential (primary) hypertension: Secondary | ICD-10-CM

## 2021-09-03 MED ORDER — CARVEDILOL 12.5 MG PO TABS: 12.5000 mg | ORAL_TABLET | Freq: Two times a day (BID) | ORAL | 0 refills | Status: DC

## 2021-09-03 MED ORDER — INDAPAMIDE 2.5 MG PO TABS: 2.5000 mg | ORAL_TABLET | Freq: Every day | ORAL | 0 refills | Status: DC

## 2021-09-03 NOTE — Telephone Encounter (Signed)
1.Medication Requested: indapamide (LOZOL) 2.5 MG tablet ? ?carvedilol (COREG) 12.5 MG tablet ? ?2. Pharmacy (Name, Street, [place]): ?[facility], [address] ? ?3. On Med List: Y ? ?4. Last Visit with PCP: 03-18-2021 ? ?5. Next visit date with PCP: 10-13-2021 ? ? ?Agent: Please be advised that RX refills may take up to 3 business days. We ask that you follow-up with your pharmacy.  ?

## 2021-09-09 ENCOUNTER — Other Ambulatory Visit: Payer: Self-pay

## 2021-09-09 MED ORDER — INDAPAMIDE 2.5 MG PO TABS: 2.5000 mg | ORAL_TABLET | Freq: Every day | ORAL | 0 refills | Status: AC

## 2021-09-09 MED ORDER — CARVEDILOL 12.5 MG PO TABS: 12.5000 mg | ORAL_TABLET | Freq: Two times a day (BID) | ORAL | 0 refills | Status: AC

## 2021-09-09 MED ORDER — POTASSIUM CHLORIDE CRYS ER 20 MEQ PO TBCR: 20.0000 meq | EXTENDED_RELEASE_TABLET | Freq: Two times a day (BID) | ORAL | 0 refills | Status: DC

## 2021-09-17 ENCOUNTER — Other Ambulatory Visit: Payer: Self-pay

## 2021-09-17 ENCOUNTER — Telehealth: Payer: Self-pay

## 2021-09-17 ENCOUNTER — Ambulatory Visit: Payer: TRICARE For Life (TFL)

## 2021-09-17 NOTE — Telephone Encounter (Signed)
This nurse called patient for scheduled telephonic AWV. There was a lot of noise and patient sounded far that I could barely hear him. He asked if we could call him to reschedule for another time. ?

## 2021-09-22 ENCOUNTER — Other Ambulatory Visit: Payer: Self-pay | Admitting: Internal Medicine

## 2021-09-22 DIAGNOSIS — M461 Sacroiliitis, not elsewhere classified: Secondary | ICD-10-CM | POA: Diagnosis not present

## 2021-09-22 DIAGNOSIS — I10 Essential (primary) hypertension: Secondary | ICD-10-CM | POA: Diagnosis not present

## 2021-09-22 DIAGNOSIS — Z6835 Body mass index (BMI) 35.0-35.9, adult: Secondary | ICD-10-CM | POA: Diagnosis not present

## 2021-09-22 DIAGNOSIS — E876 Hypokalemia: Secondary | ICD-10-CM

## 2021-09-22 DIAGNOSIS — M5134 Other intervertebral disc degeneration, thoracic region: Secondary | ICD-10-CM | POA: Diagnosis not present

## 2021-09-22 DIAGNOSIS — M5414 Radiculopathy, thoracic region: Secondary | ICD-10-CM | POA: Diagnosis not present

## 2021-09-22 DIAGNOSIS — M47816 Spondylosis without myelopathy or radiculopathy, lumbar region: Secondary | ICD-10-CM | POA: Diagnosis not present

## 2021-09-22 MED ORDER — POTASSIUM CHLORIDE CRYS ER 20 MEQ PO TBCR: 20.0000 meq | EXTENDED_RELEASE_TABLET | Freq: Two times a day (BID) | ORAL | 0 refills | Status: AC

## 2021-10-13 ENCOUNTER — Ambulatory Visit: Payer: TRICARE For Life (TFL) | Admitting: Internal Medicine

## 2021-10-14 DIAGNOSIS — M5416 Radiculopathy, lumbar region: Secondary | ICD-10-CM | POA: Diagnosis not present

## 2021-10-20 ENCOUNTER — Telehealth: Payer: Self-pay

## 2021-10-20 ENCOUNTER — Encounter: Payer: Self-pay | Admitting: Orthopaedic Surgery

## 2021-10-20 ENCOUNTER — Ambulatory Visit (INDEPENDENT_AMBULATORY_CARE_PROVIDER_SITE_OTHER): Payer: Medicare PPO | Admitting: Orthopaedic Surgery

## 2021-10-20 DIAGNOSIS — G5602 Carpal tunnel syndrome, left upper limb: Secondary | ICD-10-CM

## 2021-10-20 DIAGNOSIS — M1711 Unilateral primary osteoarthritis, right knee: Secondary | ICD-10-CM

## 2021-10-20 MED ORDER — BUPIVACAINE HCL 0.5 % IJ SOLN
2.0000 mL | INTRAMUSCULAR | Status: AC | PRN
  Administered 2021-10-20: 2 mL via INTRA_ARTICULAR

## 2021-10-20 MED ORDER — LIDOCAINE HCL 1 % IJ SOLN
2.0000 mL | INTRAMUSCULAR | Status: AC | PRN
  Administered 2021-10-20: 2 mL

## 2021-10-20 MED ORDER — METHYLPREDNISOLONE ACETATE 40 MG/ML IJ SUSP
40.0000 mg | INTRAMUSCULAR | Status: AC | PRN
  Administered 2021-10-20: 40 mg via INTRA_ARTICULAR

## 2021-10-20 NOTE — Telephone Encounter (Signed)
Noted  

## 2021-10-20 NOTE — Addendum Note (Signed)
Addended by: Azucena Cecil on: 10/20/2021 04:39 PM ? ? Modules accepted: Level of Service ? ?

## 2021-10-20 NOTE — Progress Notes (Addendum)
? ?Office Visit Note ?  ?Patient: Douglas Carpenter           ?Date of Birth: Oct 11, 1955           ?MRN: [id number] ?Visit Date: 10/20/2021 ?             ?Requested by: [name], MD ?[place] [address] ?PCP: [name], MD ? ? ?Assessment & Plan: ?Visit Diagnoses:  ?1. Primary osteoarthritis of right knee   ?2. Left carpal tunnel syndrome   ? ? ?Plan: Impression is right knee advanced degenerative joint disease and left carpal tunnel syndrome.  Regards to the right knee, we proceed with cortisone injection today.  We will also go ahead and get approval for viscosupplementation injection.  In regards to the left carpal tunnel syndrome, he would like to proceed with surgical intervention.  Risk, benefits and poss complications reviewed.  Rehab and recovery time discussed.  All questions were answered.  We will have Debbie call him to schedule this. ? ?This patient is diagnosed with osteoarthritis of the knee(s).   ? ?Radiographs show evidence of joint space narrowing, osteophytes, subchondral sclerosis and/or subchondral cysts.  This patient has knee pain which interferes with functional and activities of daily living.   ? ?This patient has experienced inadequate response, adverse effects and/or intolerance with conservative treatments such as acetaminophen, NSAIDS, topical creams, physical therapy or regular exercise, knee bracing and/or weight loss.  ? ?This patient has experienced inadequate response or has a contraindication to intra articular steroid injections for at least 3 months.  ? ?This patient is not scheduled to have a total knee replacement within 6 months of starting treatment with viscosupplementation. ? ? ?Follow-Up Instructions: Return for once approved for visco inj right knee/ post-op left ctr.  ? ?Orders:  ?Orders Placed This Encounter  ?Procedures  ? Large Joint Inj  ? ?No orders of the defined types were placed in this encounter. ? ? ? ? Procedures: ?Large Joint  Inj: R knee on 10/20/2021 4:39 PM ?Indications: pain ?Details: 22 G needle ? ?Arthrogram: No ? ?Medications: 40 mg methylPREDNISolone acetate 40 MG/ML; 2 mL lidocaine 1 %; 2 mL bupivacaine 0.5 % ?Consent was given by the patient. Patient was prepped and draped in the usual sterile fashion.  ? ? ? ? ?Clinical Data: ?No additional findings. ? ? ?Subjective: ?Chief Complaint  ?Patient presents with  ? Right Knee - Pain  ? ? ?HPI patient is a pleasant 66 year old gentleman who comes in today with recurrent right knee pain.  History of underlying osteoarthritis.  He has previously undergone cortisone as well as viscosupplementation injections in the past.  Previous viscosupplementation injection provided many months relief.  His last cortisone injection this past January provided about 1 months relief.  He continues to have pain throughout the entire right knee worse with standing for longer than 10 minutes.  He also has pain at night.  He is not taking medication for this.  He would like to repeat both injections if possible.  In regards to his left hand, he has had pain and paresthesias for a while.  Previous nerve conduction study showed moderate compression median nerve.  He is status post right carpal tunnel release and is doing well there and would like to proceed with the left in the near future. ? ?Review of Systems as detailed in HPI.  All others reviewed and are negative. ? ? ?Objective: ?Vital Signs: There were no  vitals taken for this visit. ? ?Physical Exam well-developed well-nourished gentleman in no acute distress.  Alert and oriented x3. ? ?Ortho Exam right knee exam shows trace effusion.  Range of motion 0 to 110 degrees.  Medial joint line tenderness.  Is neurovascular tact distally.  Left hand exam shows a positive Phalen and Tinel.  No thenar atrophy.  Neurovascular intact distally. ? ?Specialty Comments:  ?No specialty comments available. ? ?Imaging: ?No new imaging ? ? ?PMFS History: ?Patient Active  Problem List  ? Diagnosis Date Noted  ? Acute asthmatic bronchitis 06/14/2021  ? Other headache syndrome 05/17/2021  ? Body aches 05/17/2021  ? Acute cough 05/17/2021  ? Shortness of breath 05/17/2021  ? Influenza A 05/17/2021  ? Arthritis of carpometacarpal [facility]) joint of right thumb 04/05/2021  ? Bilateral hand numbness 04/05/2021  ? Type II diabetes mellitus with manifestations ([place]) 03/18/2021  ? Diuretic-induced hypokalemia 10/22/2020  ? Encounter for general adult medical examination with abnormal findings 08/05/2020  ? Rising PSA level 08/04/2020  ? Stage 3a chronic kidney disease ([place]) 08/04/2020  ? Prediabetes 08/03/2020  ? Primary hypertension 08/03/2020  ? Benign prostatic hyperplasia without lower urinary tract symptoms 08/03/2020  ? Hyperlipidemia with target LDL less than 130 08/03/2020  ? Primary osteoarthritis of left knee 04/16/2019  ? Primary osteoarthritis of right knee 02/20/2019  ? Exercise-induced asthma 07/21/2014  ? Chronic cough 07/21/2014  ? Upper airway cough syndrome 07/21/2014  ? Allergic rhinitis 07/21/2014  ? Lumbar stenosis 11/06/2013  ? Urethral stricture 01/07/2013  ? VOCAL CORD DISORDER 02/19/2009  ? Asthma 01/27/2009  ? GERD 01/27/2009  ? ?Past Medical History:  ?Diagnosis Date  ? Allergy   ? Arthritis   ? "everywhere"  ? BPH (benign prostatic hypertrophy)   ? Chronic rhinitis 01/27/2009  ? ED (erectile dysfunction)   ? GERD (gastroesophageal reflux disease)   ? Headache(784.0)   ? migraines, as many as 3 in a week    ? HYPERLIPIDEMIA 01/27/2009  ? HYPERTENSION 01/27/2009  ? Mild asthma   ? seasonal allergies, uses Proair once per yr.   ? Mild obstructive sleep apnea   ? per pt -- mild osa test result--  no rx cpap needed  ? Neuromuscular disorder ([place])   ? Sleep apnea   ? no cpap  ? SOB (shortness of breath)   ? Testosterone deficiency   ? Urethral stricture   ? Vocal cord anomaly   ? pt states told from [place]  doctor heard a little gurgle sound -- no farther testing done  ?   ?Family History  ?Problem Relation Age of Onset  ? Cancer Mother   ?     lung  ? Lung cancer Mother   ? Heart disease Maternal Grandmother   ? Heart disease Maternal Grandfather   ? Cancer Maternal Grandfather   ?     colon, prostate,lung  ? Colon cancer Neg Hx   ? Colon polyps Neg Hx   ? Esophageal cancer Neg Hx   ? Rectal cancer Neg Hx   ? Stomach cancer Neg Hx   ?  ?Past Surgical History:  ?Procedure Laterality Date  ? BACK SURGERY    ? COLONOSCOPY    ? CYSTOSCOPY WITH URETHRAL DILATATION N/A 01/07/2013  ? Procedure: CYSTOSCOPY WITH URETHRAL DILATATION BALLOON DILATION ;  Surgeon: Bernestine Amass, MD;  Location: [facility];  Service: Urology;  Laterality: N/A;  ? LUMBAR LAMINECTOMY/DECOMPRESSION MICRODISCECTOMY Right 11/29/2012  ? Procedure: Right Lumbar Four  to Five Laminectomy/ Decompression Microdiskectomy;  Surgeon: Otilio Connors, MD;  Location: [place] [facility];  Service: Neurosurgery;  Laterality: Right;  LUMBAR LAMINECTOMY/DECOMPRESSION MICRODISCECTOMY 1 LEVEL  ? POLYPECTOMY    ? SHOULDER ARTHROSCOPY W/ ACROMIAL REPAIR Left 08-28-2002  ? TOTAL KNEE ARTHROPLASTY Left 12/02/2019  ? TOTAL KNEE ARTHROPLASTY Left 12/02/2019  ? Procedure: LEFT TOTAL KNEE ARTHROPLASTY;  Surgeon: Leandrew Koyanagi, MD;  Location: [place];  Service: Orthopedics;  Laterality: Left;  ? UPPER GASTROINTESTINAL ENDOSCOPY    ? ?Social History  ? ?Occupational History  ? Occupation: retired  ?Tobacco Use  ? Smoking status: Former  ?  Packs/day: 0.50  ?  Years: 22.00  ?  Pack years: 11.00  ?  Types: Cigarettes  ?  Quit date: 06/27/1988  ?  Years since quitting: 33.3  ? Smokeless tobacco: Never  ?Vaping Use  ? Vaping Use: Never used  ?Substance and Sexual Activity  ? Alcohol use: Yes  ?  Alcohol/week: 21.0 standard drinks  ?  Types: 21 Shots of liquor per week  ?  Comment: cognac  ? Drug use: No  ? Sexual activity: Yes  ?  Partners: Female  ? ? ? ? ? ? ?

## 2021-10-20 NOTE — Telephone Encounter (Signed)
Please submit for right knee gel inj 

## 2021-10-24 ENCOUNTER — Other Ambulatory Visit: Payer: Self-pay | Admitting: Physician Assistant

## 2021-10-24 MED ORDER — ONDANSETRON HCL 4 MG PO TABS: 4.0000 mg | ORAL_TABLET | Freq: Three times a day (TID) | ORAL | 0 refills | Status: DC | PRN

## 2021-10-24 MED ORDER — HYDROCODONE-ACETAMINOPHEN 5-325 MG PO TABS: 1.0000 | ORAL_TABLET | Freq: Four times a day (QID) | ORAL | 0 refills | Status: DC | PRN

## 2021-10-28 DIAGNOSIS — G5602 Carpal tunnel syndrome, left upper limb: Secondary | ICD-10-CM | POA: Diagnosis not present

## 2021-11-01 ENCOUNTER — Ambulatory Visit: Payer: TRICARE For Life (TFL) | Admitting: Internal Medicine

## 2021-11-01 ENCOUNTER — Telehealth: Payer: Self-pay

## 2021-11-01 NOTE — Telephone Encounter (Signed)
VOB has been submitted ?Monovisc, right knee pending ?

## 2021-11-04 ENCOUNTER — Encounter: Payer: Self-pay | Admitting: Orthopaedic Surgery

## 2021-11-04 ENCOUNTER — Ambulatory Visit (INDEPENDENT_AMBULATORY_CARE_PROVIDER_SITE_OTHER): Payer: TRICARE For Life (TFL) | Admitting: Physician Assistant

## 2021-11-04 ENCOUNTER — Encounter: Payer: TRICARE For Life (TFL) | Admitting: Orthopaedic Surgery

## 2021-11-04 DIAGNOSIS — Z9889 Other specified postprocedural states: Secondary | ICD-10-CM | POA: Diagnosis not present

## 2021-11-04 DIAGNOSIS — G5602 Carpal tunnel syndrome, left upper limb: Secondary | ICD-10-CM | POA: Diagnosis not present

## 2021-11-04 NOTE — Progress Notes (Signed)
? ?Post-Op Visit Note ?  ?Patient: Douglas Carpenter           ?Date of Birth: Dec 14, 1955           ?MRN: [id number] ?Visit Date: 11/04/2021 ?PCP: [name], MD ? ? ?Assessment & Plan: ? ?Chief Complaint:  ?Chief Complaint  ?Patient presents with  ? Left Hand - Pain  ? ?Visit Diagnoses:  ?1. S/P carpal tunnel release   ?2. Left carpal tunnel syndrome   ? ? ?Plan: Patient is a pleasant 66 year old gentleman who comes in today 1 week status post left carpal tunnel release 10/28/2021.  He has been doing great.  Minimal peri-incisional pain but nothing more.  No paresthesias.  Has been taking ibuprofen as needed.  Examination of the left wrist reveals a well-healing surgical incision with nylon sutures in place.  No evidence of infection or cellulitis.  Fingers warm well perfused.  Today, the wound was cleaned and recovered.  Removable Velcro splint provided for which she will wear at all times for the next week.  No heavy lifting or submerging his hand underwater for 3 more weeks.  Follow-up next week for suture removal.  Call with concerns or questions. ? ?Follow-Up Instructions: Return in about 1 week (around 11/11/2021).  ? ?Orders:  ?No orders of the defined types were placed in this encounter. ? ?No orders of the defined types were placed in this encounter. ? ? ?Imaging: ?No new imaging ? ?PMFS History: ?Patient Active Problem List  ? Diagnosis Date Noted  ? Acute asthmatic bronchitis 06/14/2021  ? Other headache syndrome 05/17/2021  ? Body aches 05/17/2021  ? Acute cough 05/17/2021  ? Shortness of breath 05/17/2021  ? Influenza A 05/17/2021  ? Arthritis of carpometacarpal [facility]) joint of right thumb 04/05/2021  ? Bilateral hand numbness 04/05/2021  ? Type II diabetes mellitus with manifestations ([place]) 03/18/2021  ? Diuretic-induced hypokalemia 10/22/2020  ? Encounter for general adult medical examination with abnormal findings 08/05/2020  ? Rising PSA level 08/04/2020  ? Stage 3a chronic kidney disease ([place])  08/04/2020  ? Prediabetes 08/03/2020  ? Primary hypertension 08/03/2020  ? Benign prostatic hyperplasia without lower urinary tract symptoms 08/03/2020  ? Hyperlipidemia with target LDL less than 130 08/03/2020  ? Primary osteoarthritis of left knee 04/16/2019  ? Primary osteoarthritis of right knee 02/20/2019  ? Exercise-induced asthma 07/21/2014  ? Chronic cough 07/21/2014  ? Upper airway cough syndrome 07/21/2014  ? Allergic rhinitis 07/21/2014  ? Lumbar stenosis 11/06/2013  ? Urethral stricture 01/07/2013  ? VOCAL CORD DISORDER 02/19/2009  ? Asthma 01/27/2009  ? GERD 01/27/2009  ? ?Past Medical History:  ?Diagnosis Date  ? Allergy   ? Arthritis   ? "everywhere"  ? BPH (benign prostatic hypertrophy)   ? Chronic rhinitis 01/27/2009  ? ED (erectile dysfunction)   ? GERD (gastroesophageal reflux disease)   ? Headache(784.0)   ? migraines, as many as 3 in a week    ? HYPERLIPIDEMIA 01/27/2009  ? HYPERTENSION 01/27/2009  ? Mild asthma   ? seasonal allergies, uses Proair once per yr.   ? Mild obstructive sleep apnea   ? per pt -- mild osa test result--  no rx cpap needed  ? Neuromuscular disorder ([place])   ? Sleep apnea   ? no cpap  ? SOB (shortness of breath)   ? Testosterone deficiency   ? Urethral stricture   ? Vocal cord anomaly   ? pt states told from [place]  doctor heard  a little gurgle sound -- no farther testing done  ?  ?Family History  ?Problem Relation Age of Onset  ? Cancer Mother   ?     lung  ? Lung cancer Mother   ? Heart disease Maternal Grandmother   ? Heart disease Maternal Grandfather   ? Cancer Maternal Grandfather   ?     colon, prostate,lung  ? Colon cancer Neg Hx   ? Colon polyps Neg Hx   ? Esophageal cancer Neg Hx   ? Rectal cancer Neg Hx   ? Stomach cancer Neg Hx   ?  ?Past Surgical History:  ?Procedure Laterality Date  ? BACK SURGERY    ? COLONOSCOPY    ? CYSTOSCOPY WITH URETHRAL DILATATION N/A 01/07/2013  ? Procedure: CYSTOSCOPY WITH URETHRAL DILATATION BALLOON DILATION ;  Surgeon: Bernestine Amass,  MD;  Location: [facility];  Service: Urology;  Laterality: N/A;  ? LUMBAR LAMINECTOMY/DECOMPRESSION MICRODISCECTOMY Right 11/29/2012  ? Procedure: Right Lumbar Four to Five Laminectomy/ Decompression Microdiskectomy;  Surgeon: Otilio Connors, MD;  Location: [place] [facility];  Service: Neurosurgery;  Laterality: Right;  LUMBAR LAMINECTOMY/DECOMPRESSION MICRODISCECTOMY 1 LEVEL  ? POLYPECTOMY    ? SHOULDER ARTHROSCOPY W/ ACROMIAL REPAIR Left 08-28-2002  ? TOTAL KNEE ARTHROPLASTY Left 12/02/2019  ? TOTAL KNEE ARTHROPLASTY Left 12/02/2019  ? Procedure: LEFT TOTAL KNEE ARTHROPLASTY;  Surgeon: Leandrew Koyanagi, MD;  Location: [place];  Service: Orthopedics;  Laterality: Left;  ? UPPER GASTROINTESTINAL ENDOSCOPY    ? ?Social History  ? ?Occupational History  ? Occupation: retired  ?Tobacco Use  ? Smoking status: Former  ?  Packs/day: 0.50  ?  Years: 22.00  ?  Pack years: 11.00  ?  Types: Cigarettes  ?  Quit date: 06/27/1988  ?  Years since quitting: 33.3  ? Smokeless tobacco: Never  ?Vaping Use  ? Vaping Use: Never used  ?Substance and Sexual Activity  ? Alcohol use: Yes  ?  Alcohol/week: 21.0 standard drinks  ?  Types: 21 Shots of liquor per week  ?  Comment: cognac  ? Drug use: No  ? Sexual activity: Yes  ?  Partners: Female  ? ? ? ?

## 2021-11-05 ENCOUNTER — Telehealth: Payer: Self-pay

## 2021-11-05 ENCOUNTER — Other Ambulatory Visit: Payer: Self-pay

## 2021-11-05 DIAGNOSIS — M1711 Unilateral primary osteoarthritis, right knee: Secondary | ICD-10-CM

## 2021-11-05 NOTE — Telephone Encounter (Signed)
Tried call patient to schedule for gel injection, but no answer and no VM setup to leave a message.  If patient CB, please advise patient that he is able to receive injection at his next appointment if he likes. ?

## 2021-11-11 ENCOUNTER — Encounter: Payer: Self-pay | Admitting: Orthopaedic Surgery

## 2021-11-11 ENCOUNTER — Ambulatory Visit (INDEPENDENT_AMBULATORY_CARE_PROVIDER_SITE_OTHER): Payer: Medicare PPO | Admitting: Orthopaedic Surgery

## 2021-11-11 DIAGNOSIS — G5601 Carpal tunnel syndrome, right upper limb: Secondary | ICD-10-CM | POA: Insufficient documentation

## 2021-11-11 DIAGNOSIS — M1711 Unilateral primary osteoarthritis, right knee: Secondary | ICD-10-CM | POA: Diagnosis not present

## 2021-11-11 MED ORDER — HYALURONAN 88 MG/4ML IX SOSY
88.0000 mg | PREFILLED_SYRINGE | INTRA_ARTICULAR | Status: AC | PRN
  Administered 2021-11-11: 88 mg via INTRA_ARTICULAR

## 2021-11-11 NOTE — Progress Notes (Signed)
Office Visit Note   Patient: Douglas Carpenter           Date of Birth: [birth year]           MRN: [id number] Visit Date: 11/11/2021              Requested by: [name], MD [address] [place],  [place] [address] PCP: [name], MD   Assessment & Plan: Visit Diagnoses:  1. Primary osteoarthritis of right knee   2. Right carpal tunnel syndrome     Plan: Mr. Rumple is here for suture removal for left carpal tunnel release on 10/28/2021.  He is also here for right knee Monovisc injection.  He tolerated this injection well.  Sutures were removed from the left hand.  Follow-up as needed.  Follow-Up Instructions: No follow-ups on file.   Orders:  No orders of the defined types were placed in this encounter.  No orders of the defined types were placed in this encounter.     Procedures: Large Joint Inj: R knee on 11/11/2021 10:37 AM Indications: pain Details: 22 G needle  Arthrogram: No  Medications: 88 mg Hyaluronan 88 MG/4ML Outcome: tolerated well, no immediate complications Patient was prepped and draped in the usual sterile fashion.      Clinical Data: No additional findings.   Subjective: Chief Complaint  Patient presents with   Left Wrist - Follow-up    Left carpal tunnel release 10/28/2021   Right Knee - Follow-up    Monovisc    HPI  Review of Systems   Objective: Vital Signs: There were no vitals taken for this visit.  Physical Exam  Ortho Exam  Specialty Comments:  No specialty comments available.  Imaging: No results found.   PMFS History: Patient Active Problem List   Diagnosis Date Noted   Right carpal tunnel syndrome 11/11/2021   Acute asthmatic bronchitis 06/14/2021   Other headache syndrome 05/17/2021   Body aches 05/17/2021   Acute cough 05/17/2021   Shortness of breath 05/17/2021   Influenza A 05/17/2021   Arthritis of carpometacarpal ([facility]) joint of right thumb 04/05/2021   Bilateral hand numbness 04/05/2021    Type II diabetes mellitus with manifestations ([place]) 03/18/2021   Diuretic-induced hypokalemia 10/22/2020   Encounter for general adult medical examination with abnormal findings 08/05/2020   Rising PSA level 08/04/2020   Stage 3a chronic kidney disease ([place]) 08/04/2020   Prediabetes 08/03/2020   Primary hypertension 08/03/2020   Benign prostatic hyperplasia without lower urinary tract symptoms 08/03/2020   Hyperlipidemia with target LDL less than 130 08/03/2020   Primary osteoarthritis of left knee 04/16/2019   Primary osteoarthritis of right knee 02/20/2019   Exercise-induced asthma 07/21/2014   Chronic cough 07/21/2014   Upper airway cough syndrome 07/21/2014   Allergic rhinitis 07/21/2014   Lumbar stenosis 11/06/2013   Urethral stricture 01/07/2013   VOCAL CORD DISORDER 02/19/2009   Asthma 01/27/2009   GERD 01/27/2009   Past Medical History:  Diagnosis Date   Allergy    Arthritis    "everywhere"   BPH (benign prostatic hypertrophy)    Chronic rhinitis 01/27/2009   ED (erectile dysfunction)    GERD (gastroesophageal reflux disease)    Headache(784.0)    migraines, as many as 3 in a week     HYPERLIPIDEMIA 01/27/2009   HYPERTENSION 01/27/2009   Mild asthma    seasonal allergies, uses Proair once per yr.    Mild obstructive sleep apnea    per  pt -- mild osa test result--  no rx cpap needed   Neuromuscular disorder ([facility])    Sleep apnea    no cpap   SOB (shortness of breath)    Testosterone deficiency    Urethral stricture    Vocal cord anomaly    pt states told from [place]  doctor heard a little gurgle sound -- no farther testing done    Family History  Problem Relation Age of Onset   Cancer Mother        lung   Lung cancer Mother    Heart disease Maternal Grandmother    Heart disease Maternal Grandfather    Cancer Maternal Grandfather        colon, prostate,lung   Colon cancer Neg Hx    Colon polyps Neg Hx    Esophageal cancer Neg Hx    Rectal cancer Neg Hx     Stomach cancer Neg Hx     Past Surgical History:  Procedure Laterality Date   BACK SURGERY     COLONOSCOPY     CYSTOSCOPY WITH URETHRAL DILATATION N/A 01/07/2013   Procedure: CYSTOSCOPY WITH URETHRAL DILATATION BALLOON DILATION ;  Surgeon: Bernestine Amass, MD;  Location: [facility];  Service: Urology;  Laterality: N/A;   LUMBAR LAMINECTOMY/DECOMPRESSION MICRODISCECTOMY Right 11/29/2012   Procedure: Right Lumbar Four to Five Laminectomy/ Decompression Microdiskectomy;  Surgeon: Otilio Connors, MD;  Location: [place] [facility];  Service: Neurosurgery;  Laterality: Right;  LUMBAR LAMINECTOMY/DECOMPRESSION MICRODISCECTOMY 1 LEVEL   POLYPECTOMY     SHOULDER ARTHROSCOPY W/ ACROMIAL REPAIR Left 08-28-2002   TOTAL KNEE ARTHROPLASTY Left 12/02/2019   TOTAL KNEE ARTHROPLASTY Left 12/02/2019   Procedure: LEFT TOTAL KNEE ARTHROPLASTY;  Surgeon: Leandrew Koyanagi, MD;  Location: [place];  Service: Orthopedics;  Laterality: Left;   UPPER GASTROINTESTINAL ENDOSCOPY     Social History   Occupational History   Occupation: retired  Tobacco Use   Smoking status: Former    Packs/day: 0.50    Years: 22.00    Pack years: 11.00    Types: Cigarettes    Quit date: 06/27/1988    Years since quitting: 33.3   Smokeless tobacco: Never  Vaping Use   Vaping Use: Never used  Substance and Sexual Activity   Alcohol use: Yes    Alcohol/week: 21.0 standard drinks    Types: 21 Shots of liquor per week    Comment: cognac   Drug use: No   Sexual activity: Yes    Partners: Female

## 2021-11-18 ENCOUNTER — Encounter: Payer: TRICARE For Life (TFL) | Admitting: Orthopaedic Surgery

## 2021-12-15 DIAGNOSIS — M461 Sacroiliitis, not elsewhere classified: Secondary | ICD-10-CM | POA: Diagnosis not present

## 2021-12-15 DIAGNOSIS — M5414 Radiculopathy, thoracic region: Secondary | ICD-10-CM | POA: Diagnosis not present

## 2021-12-15 DIAGNOSIS — M5134 Other intervertebral disc degeneration, thoracic region: Secondary | ICD-10-CM | POA: Diagnosis not present

## 2021-12-15 DIAGNOSIS — M5416 Radiculopathy, lumbar region: Secondary | ICD-10-CM | POA: Diagnosis not present

## 2021-12-15 DIAGNOSIS — Z6835 Body mass index (BMI) 35.0-35.9, adult: Secondary | ICD-10-CM | POA: Diagnosis not present

## 2021-12-31 DIAGNOSIS — M461 Sacroiliitis, not elsewhere classified: Secondary | ICD-10-CM | POA: Diagnosis not present

## 2022-01-31 ENCOUNTER — Ambulatory Visit (INDEPENDENT_AMBULATORY_CARE_PROVIDER_SITE_OTHER): Payer: TRICARE For Life (TFL)

## 2022-01-31 DIAGNOSIS — Z0000 Encounter for general adult medical examination without abnormal findings: Secondary | ICD-10-CM | POA: Diagnosis not present

## 2022-01-31 NOTE — Progress Notes (Signed)
Subjective:   Douglas Carpenter is a 66 y.o. male who presents for an  Medicare Annual Wellness Visit.   I connected with Kendell Bane  today by telephone and verified that I am speaking with the correct person using two identifiers. Location patient: home Location provider: work Persons participating in the virtual visit: patient, provider.   I discussed the limitations, risks, security and privacy concerns of performing an evaluation and management service by telephone and the availability of in person appointments. I also discussed with the patient that there may be a patient responsible charge related to this service. The patient expressed understanding and verbally consented to this telephonic visit.    Interactive audio and video telecommunications were attempted between this provider and patient, however failed, due to patient having technical difficulties OR patient did not have access to video capability.  We continued and completed visit with audio only.    Review of Systems     Cardiac Risk Factors include: advanced age (>33mn, >>1women);male gender     Objective:    Today's Vitals   There is no height or weight on file to calculate BMI.     01/31/2022   11:29 AM 12/03/2019   10:17 AM 11/28/2019    9:25 AM 06/06/2019    2:03 PM 07/04/2018   10:19 AM 07/24/2017   10:52 AM 04/19/2017    2:55 PM  Advanced Directives  Does Patient Have a Medical Advance Directive? Yes Yes Yes Yes Yes Yes Yes  Type of AParamedicof ATerrebonneLiving will HKieferLiving will Living will Healthcare Power of Attorney Living will Living will HNaranjaLiving will  Does patient want to make changes to medical advance directive?  No - Patient declined   No - Patient declined    Copy of HLemoynein Chart? No - copy requested No - copy requested  No - copy requested   No - copy requested    Current Medications  (verified) Outpatient Encounter Medications as of 01/31/2022  Medication Sig   albuterol (PROAIR HFA) 108 (90 Base) MCG/ACT inhaler Inhale 2 puffs into the lungs every 6 (six) hours as needed for wheezing or shortness of breath.   albuterol (PROVENTIL) (2.5 MG/3ML) 0.083% nebulizer solution Take 3 mLs (2.5 mg total) by nebulization every 6 (six) hours as needed for wheezing or shortness of breath.   aspirin EC 81 MG tablet Take 1 tablet (81 mg total) by mouth 2 (two) times daily.   atorvastatin (LIPITOR) 80 MG tablet Take 1 tablet (80 mg total) by mouth at bedtime.   carvedilol (COREG) 12.5 MG tablet Take 1 tablet (12.5 mg total) by mouth 2 (two) times daily with a meal.   diclofenac Sodium (VOLTAREN) 1 % GEL Apply 2 g topically 4 (four) times daily.   Erenumab-aooe (AIMOVIG) 70 MG/ML SOAJ Inject 70 mg into the skin every 30 (thirty) days.   fluticasone-salmeterol (WIXELA INHUB) 250-50 MCG/ACT AEPB Inhale 1 puff into the lungs in the morning and at bedtime.   gabapentin (NEURONTIN) 600 MG tablet Take 600 mg by mouth 3 (three) times daily.    indapamide (LOZOL) 2.5 MG tablet Take 1 tablet (2.5 mg total) by mouth daily.   montelukast (SINGULAIR) 10 MG tablet Take 1 tablet (10 mg total) by mouth at bedtime.   olopatadine (PATADAY) 0.1 % ophthalmic solution Place 1 drop into both eyes 2 (two) times daily.   omeprazole (PRILOSEC) 40 MG capsule  Take 40 mg by mouth daily.    ondansetron (ZOFRAN) 4 MG tablet Take 1 tablet (4 mg total) by mouth every 8 (eight) hours as needed for nausea or vomiting.   potassium chloride SA (KLOR-CON M20) 20 MEQ tablet Take 1 tablet (20 mEq total) by mouth 2 (two) times daily.   umeclidinium bromide (INCRUSE ELLIPTA) 62.5 MCG/INH AEPB Inhale 1 puff into the lungs daily.   DULoxetine (CYMBALTA) 60 MG capsule Take 1 capsule (60 mg total) by mouth daily. (Patient not taking: Reported on 01/31/2022)   HYDROcodone-acetaminophen (NORCO) 5-325 MG tablet Take 1 tablet by mouth every 6  (six) hours as needed for moderate pain. To be taken after surgery (Patient not taking: Reported on 01/31/2022)   No facility-administered encounter medications on file as of 01/31/2022.    Allergies (verified) Aspirin   History: Past Medical History:  Diagnosis Date   Allergy    Arthritis    "everywhere"   BPH (benign prostatic hypertrophy)    Chronic rhinitis 01/27/2009   ED (erectile dysfunction)    GERD (gastroesophageal reflux disease)    Headache(784.0)    migraines, as many as 3 in a week     HYPERLIPIDEMIA 01/27/2009   HYPERTENSION 01/27/2009   Mild asthma    seasonal allergies, uses Proair once per yr.    Mild obstructive sleep apnea    per pt -- mild osa test result--  no rx cpap needed   Neuromuscular disorder (HCC)    Sleep apnea    no cpap   SOB (shortness of breath)    Testosterone deficiency    Urethral stricture    Vocal cord anomaly    pt states told from [place]  doctor heard a little gurgle sound -- no farther testing done   Past Surgical History:  Procedure Laterality Date   BACK SURGERY     COLONOSCOPY     CYSTOSCOPY WITH URETHRAL DILATATION N/A 01/07/2013   Procedure: CYSTOSCOPY WITH URETHRAL DILATATION BALLOON DILATION ;  Surgeon: Bernestine Amass, MD;  Location[facility];  Service: Urology;  Laterality: N/A;   LUMBAR LAMINECTOMY/DECOMPRESSION MICRODISCECTOMY Right 11/29/2012   Procedure: Right Lumbar Four to Five Laminectomy/ Decompression Microdiskectomy;  Surgeon: Otilio Connors, MD;  Location: [facility];  Service: Neurosurgery;  Laterality: Right;  LUMBAR LAMINECTOMY/DECOMPRESSION MICRODISCECTOMY 1 LEVEL   POLYPECTOMY     SHOULDER ARTHROSCOPY W/ ACROMIAL REPAIR Left 08-28-2002   TOTAL KNEE ARTHROPLASTY Left 12/02/2019   TOTAL KNEE ARTHROPLASTY Left 12/02/2019   Procedure: LEFT TOTAL KNEE ARTHROPLASTY;  Surgeon: Leandrew Koyanagi, MD;  Location: [facility];  Service: Orthopedics;  Laterality: Left;   UPPER GASTROINTESTINAL ENDOSCOPY     Family  History  Problem Relation Age of Onset   Cancer Mother        lung   Lung cancer Mother    Heart disease Maternal Grandmother    Heart disease Maternal Grandfather    Cancer Maternal Grandfather        colon, prostate,lung   Colon cancer Neg Hx    Colon polyps Neg Hx    Esophageal cancer Neg Hx    Rectal cancer Neg Hx    Stomach cancer Neg Hx    Social History   Socioeconomic History   Marital status: Married    Spouse name: Not on file   Number of children: Not on file   Years of education: Not on file   Highest education level: Not on file  Occupational History  Occupation: retired  Tobacco Use   Smoking status: Former    Packs/day: 0.50    Years: 22.00    Total pack years: 11.00    Types: Cigarettes    Quit date: 06/27/1988    Years since quitting: 33.6   Smokeless tobacco: Never  Vaping Use   Vaping Use: Never used  Substance and Sexual Activity   Alcohol use: Yes    Alcohol/week: 21.0 standard drinks of alcohol    Types: 21 Shots of liquor per week    Comment: cognac   Drug use: No   Sexual activity: Yes    Partners: Female  Other Topics Concern   Not on file  Social History Narrative   Not on file   Social Determinants of Health   Financial Resource Strain: Low Risk  (01/31/2022)   Overall Financial Resource Strain (CARDIA)    Difficulty of Paying Living Expenses: Not hard at all  Food Insecurity: No Food Insecurity (01/31/2022)   Hunger Vital Sign    Worried About Running Out of Food in the Last Year: Never true    Ran Out of Food in the Last Year: Never true  Transportation Needs: No Transportation Needs (01/31/2022)   PRAPARE - Hydrologist (Medical): No    Lack of Transportation (Non-Medical): No  Physical Activity: Sufficiently Active (01/31/2022)   Exercise Vital Sign    Days of Exercise per Week: 5 days    Minutes of Exercise per Session: 40 min  Stress: No Stress Concern Present (01/31/2022)   [place]    Feeling of Stress : Not at all  Social Connections: Moderately Integrated (01/31/2022)   Social Connection and Isolation Panel [NHANES]    Frequency of Communication with Friends and Family: Three times a week    Frequency of Social Gatherings with Friends and Family: Three times a week    Attends Religious Services: More than 4 times per year    Active Member of Clubs or Organizations: No    Attends Archivist Meetings: Never    Marital Status: Married    Tobacco Counseling Counseling given: Not Answered   Clinical Intake:  Pre-visit preparation completed: Yes  Pain : No/denies pain     Nutritional Risks: None Diabetes: Yes CBG done?: No Did pt. bring in CBG monitor from home?: No  How often do you need to have someone help you when you read instructions, pamphlets, or other written materials from your doctor or pharmacy?: 1 - Never What is the last grade level you completed in school?: college  Diabetic?yes Nutrition Risk Assessment:  Has the patient had any N/V/D within the last 2 months?  No  Does the patient have any non-healing wounds?  No  Has the patient had any unintentional weight loss or weight gain?  No   Diabetes:  Is the patient diabetic?  Yes  If diabetic, was a CBG obtained today?  No  Did the patient bring in their glucometer from home?  No  How often do you monitor your CBG's? Never .   Financial Strains and Diabetes Management:  Are you having any financial strains with the device, your supplies or your medication? No .  Does the patient want to be seen by Chronic Care Management for management of their diabetes?  No  Would the patient like to be referred to a Nutritionist or for Diabetic Management?  No   Diabetic Exams:  [facility]  Diabetic Foot Exam: Overdue, Pt has been advised about the importance in completing this exam. Pt is scheduled for  diabetic foot exam on next office visit .   Interpreter Needed?: No  Information entered by :: L.Wilson,LPn   Activities of Daily Living    01/31/2022   11:32 AM 03/18/2021    8:10 AM  In your present state of health, do you have any difficulty performing the following activities:  Hearing? 0   Vision? 0 0  Difficulty concentrating or making decisions? 0 0  Walking or climbing stairs? 0 0  Dressing or bathing? 0 0  Doing errands, shopping? 0 0  Preparing Food and eating ? N   Using the Toilet? N   In the past six months, have you accidently leaked urine? N   Do you have problems with loss of bowel control? N   Managing your Medications? N   Managing your Finances? N   Housekeeping or managing your Housekeeping? N     Patient Care Team: [name], MD as PCP - General (Internal Medicine) Jettie Booze, MD as PCP - Cardiology (Cardiology) Jovita Gamma, MD as Consulting Physician (Neurosurgery) [name], MD as Consulting Physician (Pulmonary Disease)  Indicate any recent Medical Services you may have received from other than [facility] providers in the past year (date may be approximate).     Assessment:   This is a routine wellness examination for [place].  Hearing/Vision screen Vision Screening - Comments:: Annual eye exams wear glasses   Dietary issues and exercise activities discussed: Current Exercise Habits: Home exercise routine, Type of exercise: walking, Time (Minutes): 30, Frequency (Times/Week): 5, Weekly Exercise (Minutes/Week): 150, Intensity: Mild, Exercise limited by: None identified   Goals Addressed   None    Depression Screen    01/31/2022   11:30 AM 01/31/2022   11:28 AM 09/25/2020    9:21 AM 08/03/2020    1:50 PM 06/06/2019    2:05 PM 02/25/2019   11:59 AM 12/31/2018   10:32 AM  PHQ 2/9 Scores  PHQ - 2 Score 0 0 0 0 0 0 0    Fall Risk    01/31/2022   11:30 AM 09/25/2020    9:20 AM 08/03/2020    1:45 PM 06/06/2019    2:05 PM 02/25/2019    11:59 AM  Fall Risk   Falls in the past year? 0 0 0 0 0  Number falls in past yr: 0 0  0 0  Injury with Fall? 0 0  0 0  Risk for fall due to :  No Fall Risks     Follow up Falls evaluation completed;Education provided   Falls evaluation completed;Education provided Falls evaluation completed    Smoot:  Any stairs in or around the home? No  If so, are there any without handrails? No  Home free of loose throw rugs in walkways, pet beds, electrical cords, etc? Yes  Adequate lighting in your home to reduce risk of falls? Yes   ASSISTIVE DEVICES UTILIZED TO PREVENT FALLS:  Life alert? No  Use of a cane, walker or w/c? No  Grab bars in the bathroom? No  Shower chair or bench in shower? No  Elevated toilet seat or a handicapped toilet? No     Cognitive Function:    Normal cognitive status assessed by telephone conversation by this Nurse Health Advisor. No abnormalities found.  01/31/2022   11:32 AM 06/06/2019    2:04 PM 04/19/2017    9:52 AM  6CIT Screen  What Year? 0 points 0 points 0 points  What month? 0 points 0 points 0 points  What time? 0 points 0 points 0 points  Count back from 20 0 points 0 points 0 points  Months in reverse 0 points 0 points 0 points  Repeat phrase 0 points 0 points 2 points  Total Score 0 points 0 points 2 points    Immunizations Immunization History  Administered Date(s) Administered   Fluad Quad(high Dose 65+) 03/18/2021   Influenza Split 03/21/2012   Influenza Whole 03/08/2011   Influenza, High Dose Seasonal PF 03/27/2020   Influenza,inj,Quad PF,6+ Mos 05/26/2014, 03/06/2015, 04/14/2016, 04/03/2017, 04/03/2018, 04/11/2019, 03/13/2020   Influenza-Unspecified 07/24/2003, 07/30/2004, 08/24/2006, 04/12/2007, 04/01/2008, 06/08/2009, 04/06/2010, 02/26/2011, 02/26/2012, 04/25/2013, 05/13/2014   Moderna Sars-Covid-2 Vaccination 08/22/2019, 09/19/2019, 04/21/2020, 11/03/2020   Pneumococcal Conjugate-13  05/26/2014, 05/03/2018   Pneumococcal Polysaccharide-23 11/30/2012, 05/13/2014, 08/03/2020   Tdap 04/18/2018   Zoster Recombinat (Shingrix) 03/13/2020, 05/25/2020    TDAP status: Up to date  Flu Vaccine status: Up to date  Pneumococcal vaccine status: Up to date  Covid-19 vaccine status: Completed vaccines  Qualifies for Shingles Vaccine? Yes   Zostavax completed No   Shingrix Completed?: Yes  Screening Tests Health Maintenance  Topic Date Due   FOOT EXAM  Never done   OPHTHALMOLOGY EXAM  Never done   Diabetic kidney evaluation - Urine ACR  Never done   COVID-19 Vaccine (5 - Moderna series) 12/29/2020   HEMOGLOBIN A1C  09/15/2021   INFLUENZA VACCINE  01/25/2022   Diabetic kidney evaluation - GFR measurement  03/18/2022   COLONOSCOPY (Pts 45-44yr Insurance coverage will need to be confirmed)  06/05/2023   TETANUS/TDAP  04/18/2028   Pneumonia Vaccine 67 Years old  Completed   Hepatitis C Screening  Completed   Zoster Vaccines- Shingrix  Completed   HPV VACCINES  Aged Out    Health Maintenance  Health Maintenance Due  Topic Date Due   FOOT EXAM  Never done   OPHTHALMOLOGY EXAM  Never done   Diabetic kidney evaluation - Urine ACR  Never done   COVID-19 Vaccine (5 - Moderna series) 12/29/2020   HEMOGLOBIN A1C  09/15/2021   INFLUENZA VACCINE  01/25/2022    Colorectal cancer screening: Type of screening: Colonoscopy. Completed 06/04/2018. Repeat every 5 years  Lung Cancer Screening: (Low Dose CT Chest recommended if Age 67-80years, 30 pack-year currently smoking OR have quit w/in 15years.) does not qualify.   Lung Cancer Screening Referral: n/a  Additional Screening:  Hepatitis C Screening: does not qualify;  Vision Screening: Recommended annual ophthalmology exams for early detection of glaucoma and other disorders of the eye. Is the patient up to date with their annual eye exam?  Yes  Who is the provider or what is the name of the office in which the patient  attends [facility] If pt is not established with a provider, would they like to be referred to a provider to establish care? No .   Dental Screening: Recommended annual dental exams for proper oral hygiene  Community Resource Referral / Chronic Care Management: CRR required this visit?  No   CCM required this visit?  No      Plan:     I have personally reviewed and noted the following in the patient's chart:   Medical and social history Use of alcohol, tobacco or illicit  drugs  Current medications and supplements including opioid prescriptions. Patient is not currently taking opioid prescriptions. Functional ability and status Nutritional status Physical activity Advanced directives List of other physicians Hospitalizations, surgeries, and ER visits in previous 12 months Vitals Screenings to include cognitive, depression, and falls Referrals and appointments  In addition, I have reviewed and discussed with patient certain preventive protocols, quality metrics, and best practice recommendations. A written personalized care plan for preventive services as well as general preventive health recommendations were provided to patient.     Daphane Shepherd, LPN   10/01/5679   Nurse Notes: none

## 2022-01-31 NOTE — Patient Instructions (Signed)
Douglas Carpenter , Thank you for taking time to come for your Medicare Wellness Visit. I appreciate your ongoing commitment to your health goals. Please review the following plan we discussed and let me know if I can assist you in the future.   Screening recommendations/referrals: Colonoscopy: 06/04/2018 Recommended yearly ophthalmology/optometry visit for glaucoma screening and checkup Recommended yearly dental visit for hygiene and checkup  Vaccinations: Influenza vaccine: completed  Pneumococcal vaccine: completed  Tdap vaccine: 04/18/2018 Shingles vaccine: completed     Advanced directives: yes   Conditions/risks identified: none   Next appointment: none   Preventive Care 1 Years and Older, Male Preventive care refers to lifestyle choices and visits with your health care provider that can promote health and wellness. What does preventive care include? A yearly physical exam. This is also called an annual well check. Dental exams once or twice a year. Routine eye exams. Ask your health care provider how often you should have your eyes checked. Personal lifestyle choices, including: Daily care of your teeth and gums. Regular physical activity. Eating a healthy diet. Avoiding tobacco and drug use. Limiting alcohol use. Practicing safe sex. Taking low doses of aspirin every day. Taking vitamin and mineral supplements as recommended by your health care provider. What happens during an annual well check? The services and screenings done by your health care provider during your annual well check will depend on your age, overall health, lifestyle risk factors, and family history of disease. Counseling  Your health care provider may ask you questions about your: Alcohol use. Tobacco use. Drug use. Emotional well-being. Home and relationship well-being. Sexual activity. Eating habits. History of falls. Memory and ability to understand (cognition). Work and work  Statistician. Screening  You may have the following tests or measurements: Height, weight, and BMI. Blood pressure. Lipid and cholesterol levels. These may be checked every 5 years, or more frequently if you are over 31 years old. Skin check. Lung cancer screening. You may have this screening every year starting at age 24 if you have a 30-pack-year history of smoking and currently smoke or have quit within the past 15 years. Fecal occult blood test (FOBT) of the stool. You may have this test every year starting at age 44. Flexible sigmoidoscopy or colonoscopy. You may have a sigmoidoscopy every 5 years or a colonoscopy every 10 years starting at age 70. Prostate cancer screening. Recommendations will vary depending on your family history and other risks. Hepatitis C blood test. Hepatitis B blood test. Sexually transmitted disease (STD) testing. Diabetes screening. This is done by checking your blood sugar (glucose) after you have not eaten for a while (fasting). You may have this done every 1-3 years. Abdominal aortic aneurysm (AAA) screening. You may need this if you are a current or former smoker. Osteoporosis. You may be screened starting at age 30 if you are at high risk. Talk with your health care provider about your test results, treatment options, and if necessary, the need for more tests. Vaccines  Your health care provider may recommend certain vaccines, such as: Influenza vaccine. This is recommended every year. Tetanus, diphtheria, and acellular pertussis (Tdap, Td) vaccine. You may need a Td booster every 10 years. Zoster vaccine. You may need this after age 82. Pneumococcal 13-valent conjugate (PCV13) vaccine. One dose is recommended after age 59. Pneumococcal polysaccharide (PPSV23) vaccine. One dose is recommended after age 13. Talk to your health care provider about which screenings and vaccines you need and how often you need  them. This information is not intended to replace  advice given to you by your health care provider. Make sure you discuss any questions you have with your health care provider. Document Released: 07/10/2015 Document Revised: 03/02/2016 Document Reviewed: 04/14/2015 Elsevier Interactive Patient Education  2017 [place] Prevention in the Home Falls can cause injuries. They can happen to people of all ages. There are many things you can do to make your home safe and to help prevent falls. What can I do on the outside of my home? Regularly fix the edges of walkways and driveways and fix any cracks. Remove anything that might make you trip as you walk through a door, such as a raised step or threshold. Trim any bushes or trees on the path to your home. Use bright outdoor lighting. Clear any walking paths of anything that might make someone trip, such as rocks or tools. Regularly check to see if handrails are loose or broken. Make sure that both sides of any steps have handrails. Any raised decks and porches should have guardrails on the edges. Have any leaves, snow, or ice cleared regularly. Use sand or salt on walking paths during winter. Clean up any spills in your garage right away. This includes oil or grease spills. What can I do in the bathroom? Use night lights. Install grab bars by the toilet and in the tub and shower. Do not use towel bars as grab bars. Use non-skid mats or decals in the tub or shower. If you need to sit down in the shower, use a plastic, non-slip stool. Keep the floor dry. Clean up any water that spills on the floor as soon as it happens. Remove soap buildup in the tub or shower regularly. Attach bath mats securely with double-sided non-slip rug tape. Do not have throw rugs and other things on the floor that can make you trip. What can I do in the bedroom? Use night lights. Make sure that you have a light by your bed that is easy to reach. Do not use any sheets or blankets that are too big for your bed.  They should not hang down onto the floor. Have a firm chair that has side arms. You can use this for support while you get dressed. Do not have throw rugs and other things on the floor that can make you trip. What can I do in the kitchen? Clean up any spills right away. Avoid walking on wet floors. Keep items that you use a lot in easy-to-reach places. If you need to reach something above you, use a strong step stool that has a grab bar. Keep electrical cords out of the way. Do not use floor polish or wax that makes floors slippery. If you must use wax, use non-skid floor wax. Do not have throw rugs and other things on the floor that can make you trip. What can I do with my stairs? Do not leave any items on the stairs. Make sure that there are handrails on both sides of the stairs and use them. Fix handrails that are broken or loose. Make sure that handrails are as long as the stairways. Check any carpeting to make sure that it is firmly attached to the stairs. Fix any carpet that is loose or worn. Avoid having throw rugs at the top or bottom of the stairs. If you do have throw rugs, attach them to the floor with carpet tape. Make sure that you have a light switch at  the top of the stairs and the bottom of the stairs. If you do not have them, ask someone to add them for you. What else can I do to help prevent falls? Wear shoes that: Do not have high heels. Have rubber bottoms. Are comfortable and fit you well. Are closed at the toe. Do not wear sandals. If you use a stepladder: Make sure that it is fully opened. Do not climb a closed stepladder. Make sure that both sides of the stepladder are locked into place. Ask someone to hold it for you, if possible. Clearly mark and make sure that you can see: Any grab bars or handrails. First and last steps. Where the edge of each step is. Use tools that help you move around (mobility aids) if they are needed. These  include: Canes. Walkers. Scooters. Crutches. Turn on the lights when you go into a dark area. Replace any light bulbs as soon as they burn out. Set up your furniture so you have a clear path. Avoid moving your furniture around. If any of your floors are uneven, fix them. If there are any pets around you, be aware of where they are. Review your medicines with your doctor. Some medicines can make you feel dizzy. This can increase your chance of falling. Ask your doctor what other things that you can do to help prevent falls. This information is not intended to replace advice given to you by your health care provider. Make sure you discuss any questions you have with your health care provider. Document Released: 04/09/2009 Document Revised: 11/19/2015 Document Reviewed: 07/18/2014 Elsevier Interactive Patient Education  2017 [employer].

## 2022-02-03 ENCOUNTER — Ambulatory Visit: Payer: TRICARE For Life (TFL) | Admitting: Nurse Practitioner

## 2022-02-23 ENCOUNTER — Encounter: Payer: Self-pay | Admitting: Pulmonary Disease

## 2022-02-23 ENCOUNTER — Ambulatory Visit (INDEPENDENT_AMBULATORY_CARE_PROVIDER_SITE_OTHER): Payer: Medicare PPO | Admitting: Pulmonary Disease

## 2022-02-23 VITALS — BP 120/80 | HR 93 | Ht 67.0 in | Wt 221.6 lb

## 2022-02-23 DIAGNOSIS — J4540 Moderate persistent asthma, uncomplicated: Secondary | ICD-10-CM | POA: Diagnosis not present

## 2022-02-23 DIAGNOSIS — H1013 Acute atopic conjunctivitis, bilateral: Secondary | ICD-10-CM | POA: Diagnosis not present

## 2022-02-23 DIAGNOSIS — J301 Allergic rhinitis due to pollen: Secondary | ICD-10-CM

## 2022-02-23 NOTE — Progress Notes (Signed)
[facility]  Chief Complaint  Patient presents with   Follow-up    Follow-up   Constitutional:  BP 120/80 (BP Location: Left Arm)   Pulse 93   Ht '5\' 7"'$  (1.702 m)   Wt 221 lb 9.6 oz (100.5 kg)   SpO2 96%   BMI 34.71 kg/m   Past Medical History:  Low T, HTN, HLD, HA, GERD, BPH, OA, COVID 19 infection January 2022  Past Surgical History:  He  has a past surgical history that includes Lumbar laminectomy/decompression microdiscectomy (Right, 11/29/2012); Shoulder arthroscopy w/ acromial repair (Left, 08-28-2002); Cystoscopy with urethral dilatation (N/A, 01/07/2013); Colonoscopy; Polypectomy; Upper gastrointestinal endoscopy; Back surgery; Total knee arthroplasty (Left, 12/02/2019); and Total knee arthroplasty (Left, 12/02/2019).  Brief Summary:  Douglas Carpenter is a 66 y.o. male former smoker with chronic cough in setting of allergic asthma, Post nasal drip, GERD, and vocal cord disease.  He also has hx of snoring.      Subjective:   He is slowly improving from exacerbation a few weeks ago after he got a URI from exposure to his grandchildren.  He has someone deep cleaning his house.  He isn't sure what the plan is for his dog.  Not having cough, wheeze, sputum, chest congestion, itchy eyes, or runny nose.  Sleeping okay.   Physical Exam:   Appearance - well kempt   ENMT - no sinus tenderness, no oral exudate, no LAN, Mallampati 3 airway, no stridor  Respiratory - equal breath sounds bilaterally, no wheezing or rales  CV - s1s2 regular rate and rhythm, no murmurs  Ext - no clubbing, no edema  Skin - no rashes  Psych - normal mood and affect     Pulmonary testing:  PFT 02/19/09 >> FEV1 2.90 (90%), FEV1% 70, TLC 5.62(93%), DLCO 99%, +BD PFT 07/21/14 >> FEV1 2.70 (93%), FEV1% 74, TLC 5.97 (91%), RV 2.65 (126%), DLCO 106%, +BD RAST 07/21/14 >> react to dust mites, grasses; IgE 49 FeNO 10/27/15 >> 228  Sleep Tests:  HST 03/07/13 >>  AHI 3.9, SaO2 low 80%  Cardiac Tests:  Echo 12/14/20 >> EF 55 to 60%  Social History:  He  reports that he quit smoking about 33 years ago. His smoking use included cigarettes. He has a 11.00 pack-year smoking history. He has never used smokeless tobacco. He reports current alcohol use of about 21.0 standard drinks of alcohol per week. He reports that he does not use drugs.  Family History:  His family history includes Cancer in his maternal grandfather and mother; Heart disease in his maternal grandfather and maternal grandmother; Lung cancer in his mother.     Assessment/Plan:   Allergic asthma with exercise induced bronchoconstriction. - he gets scripts from the [place] - he has frequent exacerbations, and these happen typical with onset of Fall and Spring - continue wixela and singulair - prn albuterol  Upper airway cough syndrome. - continue singulair with prn claritin   Allergic conjunctivitis. - prn pataday   Time Spent Involved in Patient Care on Day of Examination:  25 minutes  Follow up:   Patient Instructions  Follow up in 6 months  Medication List:   Allergies as of 02/23/2022       Reactions   Aspirin Rash   Pt can tolerate low doses-- INTOLERANT TO HIGHER DOSES        Medication List        Accurate as of February 23, 2022  2:14  PM. If you have any questions, ask your nurse or doctor.          STOP taking these medications    DULoxetine 60 MG capsule Commonly known as: Cymbalta Stopped by: [name], MD   HYDROcodone-acetaminophen 5-325 MG tablet Commonly known as: Norco Stopped by: [name], MD   Incruse Ellipta 62.5 MCG/ACT Aepb Generic drug: umeclidinium bromide Stopped by: [name], MD       TAKE these medications    Aimovig 70 MG/ML Soaj Generic drug: Erenumab-aooe Inject 70 mg into the skin every 30 (thirty) days.   albuterol 108 (90 Base) MCG/ACT inhaler Commonly known as: ProAir HFA Inhale 2 puffs into the lungs  every 6 (six) hours as needed for wheezing or shortness of breath.   albuterol (2.5 MG/3ML) 0.083% nebulizer solution Commonly known as: PROVENTIL Take 3 mLs (2.5 mg total) by nebulization every 6 (six) hours as needed for wheezing or shortness of breath.   aspirin EC 81 MG tablet Take 1 tablet (81 mg total) by mouth 2 (two) times daily.   atorvastatin 80 MG tablet Commonly known as: LIPITOR Take 1 tablet (80 mg total) by mouth at bedtime.   carvedilol 12.5 MG tablet Commonly known as: COREG Take 1 tablet (12.5 mg total) by mouth 2 (two) times daily with a meal.   diclofenac Sodium 1 % Gel Commonly known as: Voltaren Apply 2 g topically 4 (four) times daily.   fluticasone-salmeterol 250-50 MCG/ACT Aepb Commonly known as: Wixela Inhub Inhale 1 puff into the lungs in the morning and at bedtime.   gabapentin 600 MG tablet Commonly known as: NEURONTIN Take 600 mg by mouth 3 (three) times daily.   indapamide 2.5 MG tablet Commonly known as: LOZOL Take 1 tablet (2.5 mg total) by mouth daily.   montelukast 10 MG tablet Commonly known as: SINGULAIR Take 1 tablet (10 mg total) by mouth at bedtime.   olopatadine 0.1 % ophthalmic solution Commonly known as: Pataday Place 1 drop into both eyes 2 (two) times daily.   omeprazole 40 MG capsule Commonly known as: PRILOSEC Take 40 mg by mouth daily.   ondansetron 4 MG tablet Commonly known as: Zofran Take 1 tablet (4 mg total) by mouth every 8 (eight) hours as needed for nausea or vomiting.   potassium chloride SA 20 MEQ tablet Commonly known as: Klor-Con M20 Take 1 tablet (20 mEq total) by mouth 2 (two) times daily.        Signature:  [name], MD [address] Pager - [phone] 02/23/2022, 2:14 PM

## 2022-02-23 NOTE — Patient Instructions (Signed)
Follow up in 6 months 

## 2022-03-07 DIAGNOSIS — M5134 Other intervertebral disc degeneration, thoracic region: Secondary | ICD-10-CM | POA: Diagnosis not present

## 2022-03-07 DIAGNOSIS — M461 Sacroiliitis, not elsewhere classified: Secondary | ICD-10-CM | POA: Diagnosis not present

## 2022-03-07 DIAGNOSIS — M5414 Radiculopathy, thoracic region: Secondary | ICD-10-CM | POA: Diagnosis not present

## 2022-03-07 DIAGNOSIS — M5416 Radiculopathy, lumbar region: Secondary | ICD-10-CM | POA: Diagnosis not present

## 2022-03-18 DIAGNOSIS — M5414 Radiculopathy, thoracic region: Secondary | ICD-10-CM | POA: Diagnosis not present

## 2022-04-05 ENCOUNTER — Telehealth: Payer: Self-pay | Admitting: Orthopaedic Surgery

## 2022-04-05 NOTE — Telephone Encounter (Signed)
Called patient. It has not been 6 months since his last monovisc injection into the right knee. He has been scheduled next week for a cortisone injection.

## 2022-04-05 NOTE — Telephone Encounter (Signed)
Patient wife called in stating patient would like to get a gel injection in the knee please advise

## 2022-04-12 ENCOUNTER — Encounter: Payer: Self-pay | Admitting: Orthopaedic Surgery

## 2022-04-12 ENCOUNTER — Telehealth: Payer: Self-pay

## 2022-04-12 ENCOUNTER — Ambulatory Visit (INDEPENDENT_AMBULATORY_CARE_PROVIDER_SITE_OTHER): Payer: Medicare PPO | Admitting: Orthopaedic Surgery

## 2022-04-12 DIAGNOSIS — M65311 Trigger thumb, right thumb: Secondary | ICD-10-CM

## 2022-04-12 DIAGNOSIS — M1711 Unilateral primary osteoarthritis, right knee: Secondary | ICD-10-CM | POA: Diagnosis not present

## 2022-04-12 NOTE — Progress Notes (Unsigned)
Office Visit Note   Patient: Douglas Carpenter           Date of Birth: [birth year]           MRN: [id number] Visit Date: 04/12/2022              Requested by: [name], MD [address] [place],  [place] [address] PCP: [name], MD   Assessment & Plan: Visit Diagnoses:  1. Unilateral primary osteoarthritis, right knee   2. Trigger thumb, right thumb     Plan: Impression is right knee osteoarthritis and right trigger thumb.  In regards to the right knee, he would like to repeat cortisone injection today and also go ahead get approval for viscosupplementation injection.  In regards to the right thumb, we discussed various treatment options to include cortisone injection versus A1 pulley release.  He would like to first try cortisone injection.  We will also provide him with a night splint to wear for the next week.  Follow-up as needed for his thumb.  Call with concerns or questions.  Follow-Up Instructions: Return if symptoms worsen or fail to improve.   Orders:  No orders of the defined types were placed in this encounter.  No orders of the defined types were placed in this encounter.     Procedures: No procedures performed   Clinical Data: No additional findings.   Subjective: Chief Complaint  Patient presents with   Right Knee - Pain    HPI patient is a pleasant 66 year old gentleman who comes in today with right knee pain and right thumb triggering.  In regards to the right knee, he has a history of underlying osteoarthritis.  He has had cortisone as well as viscosupplementation injections in the past with good but temporary relief.  His last cortisone injection was in April of this year and his last viscosupplementation injection was in May of this past year.  His right knee pain has returned and is constant.  Worse with activity.  He is getting pain at night.  In regards to his right thumb, he is recently noticed pain and triggering.  No specific  aggravators.  No previous cortisone injection.  He is not a diabetic.  Review of Systems as detailed in HPI.  All others reviewed and are negative.   Objective: Vital Signs: There were no vitals taken for this visit.  Physical Exam well-developed well-nourished gentleman in no acute distress.  Alert and oriented x3.  Ortho Exam right knee exam shows no effusion.  Range of motion 0 to 120 degrees.  Minimal medial joint line tenderness.  He is neurovascular tact distally.  Right thumb exam shows moderately tender palpable nodule at the A1 pulley.  He does have reproducible triggering.  He is neurovascular intact distally.  Specialty Comments:  No specialty comments available.  Imaging: No new imaging   PMFS History: Patient Active Problem List   Diagnosis Date Noted   Right carpal tunnel syndrome 11/11/2021   Acute asthmatic bronchitis 06/14/2021   Other headache syndrome 05/17/2021   Body aches 05/17/2021   Acute cough 05/17/2021   Shortness of breath 05/17/2021   Influenza A 05/17/2021   Arthritis of carpometacarpal (CMC) joint of right thumb 04/05/2021   Bilateral hand numbness 04/05/2021   Type II diabetes mellitus with manifestations ([place]) 03/18/2021   Diuretic-induced hypokalemia 10/22/2020   Encounter for general adult medical examination with abnormal findings 08/05/2020   Rising PSA level 08/04/2020   Stage  3a chronic kidney disease ([place]) 08/04/2020   Prediabetes 08/03/2020   Primary hypertension 08/03/2020   Benign prostatic hyperplasia without lower urinary tract symptoms 08/03/2020   Hyperlipidemia with target LDL less than 130 08/03/2020   Primary osteoarthritis of left knee 04/16/2019   Primary osteoarthritis of right knee 02/20/2019   Exercise-induced asthma 07/21/2014   Chronic cough 07/21/2014   Upper airway cough syndrome 07/21/2014   Allergic rhinitis 07/21/2014   Lumbar stenosis 11/06/2013   Urethral stricture 01/07/2013   VOCAL CORD DISORDER  02/19/2009   Asthma 01/27/2009   GERD 01/27/2009   Past Medical History:  Diagnosis Date   Allergy    Arthritis    "everywhere"   BPH (benign prostatic hypertrophy)    Chronic rhinitis 01/27/2009   ED (erectile dysfunction)    GERD (gastroesophageal reflux disease)    Headache(784.0)    migraines, as many as 3 in a week     HYPERLIPIDEMIA 01/27/2009   HYPERTENSION 01/27/2009   Mild asthma    seasonal allergies, uses Proair once per yr.    Mild obstructive sleep apnea    per pt -- mild osa test result--  no rx cpap needed   Neuromuscular disorder (HCC)    Sleep apnea    no cpap   SOB (shortness of breath)    Testosterone deficiency    Urethral stricture    Vocal cord anomaly    pt states told from [place]  doctor heard a little gurgle sound -- no farther testing done    Family History  Problem Relation Age of Onset   Cancer Mother        lung   Lung cancer Mother    Heart disease Maternal Grandmother    Heart disease Maternal Grandfather    Cancer Maternal Grandfather        colon, prostate,lung   Colon cancer Neg Hx    Colon polyps Neg Hx    Esophageal cancer Neg Hx    Rectal cancer Neg Hx    Stomach cancer Neg Hx     Past Surgical History:  Procedure Laterality Date   BACK SURGERY     COLONOSCOPY     CYSTOSCOPY WITH URETHRAL DILATATION N/A 01/07/2013   Procedure: CYSTOSCOPY WITH URETHRAL DILATATION BALLOON DILATION ;  Surgeon: Bernestine Amass, MD;  Location: [facility];  Service: Urology;  Laterality: N/A;   LUMBAR LAMINECTOMY/DECOMPRESSION MICRODISCECTOMY Right 11/29/2012   Procedure: Right Lumbar Four to Five Laminectomy/ Decompression Microdiskectomy;  Surgeon: Otilio Connors, MD;  Location: [facility];  Service: Neurosurgery;  Laterality: Right;  LUMBAR LAMINECTOMY/DECOMPRESSION MICRODISCECTOMY 1 LEVEL   POLYPECTOMY     SHOULDER ARTHROSCOPY W/ ACROMIAL REPAIR Left 08-28-2002   TOTAL KNEE ARTHROPLASTY Left 12/02/2019   TOTAL KNEE ARTHROPLASTY Left  12/02/2019   Procedure: LEFT TOTAL KNEE ARTHROPLASTY;  Surgeon: Leandrew Koyanagi, MD;  Location: [facility];  Service: Orthopedics;  Laterality: Left;   UPPER GASTROINTESTINAL ENDOSCOPY     Social History   Occupational History   Occupation: retired  Tobacco Use   Smoking status: Former    Packs/day: 0.50    Years: 22.00    Total pack years: 11.00    Types: Cigarettes    Quit date: 06/27/1988    Years since quitting: 33.8   Smokeless tobacco: Never  Vaping Use   Vaping Use: Never used  Substance and Sexual Activity   Alcohol use: Yes    Alcohol/week: 21.0 standard drinks of alcohol  Types: 21 Shots of liquor per week    Comment: cognac   Drug use: No   Sexual activity: Yes    Partners: Female

## 2022-04-12 NOTE — Telephone Encounter (Signed)
Please precert for right knee gel injection. Patient received monovisc earlier this year. Dr.Xu's patient. Thanks!

## 2022-04-13 DIAGNOSIS — M1711 Unilateral primary osteoarthritis, right knee: Secondary | ICD-10-CM

## 2022-04-13 DIAGNOSIS — M461 Sacroiliitis, not elsewhere classified: Secondary | ICD-10-CM | POA: Diagnosis not present

## 2022-04-13 DIAGNOSIS — M5416 Radiculopathy, lumbar region: Secondary | ICD-10-CM | POA: Diagnosis not present

## 2022-04-13 DIAGNOSIS — M65311 Trigger thumb, right thumb: Secondary | ICD-10-CM

## 2022-04-13 DIAGNOSIS — M5134 Other intervertebral disc degeneration, thoracic region: Secondary | ICD-10-CM | POA: Diagnosis not present

## 2022-04-13 MED ORDER — LIDOCAINE HCL 1 % IJ SOLN
2.0000 mL | INTRAMUSCULAR | Status: AC | PRN
  Administered 2022-04-13: 2 mL

## 2022-04-13 MED ORDER — METHYLPREDNISOLONE ACETATE 40 MG/ML IJ SUSP
40.0000 mg | INTRAMUSCULAR | Status: AC | PRN
  Administered 2022-04-13: 40 mg via INTRA_ARTICULAR

## 2022-04-13 MED ORDER — LIDOCAINE HCL 1 % IJ SOLN
0.3000 mL | INTRAMUSCULAR | Status: AC | PRN
  Administered 2022-04-13: .3 mL

## 2022-04-13 MED ORDER — BUPIVACAINE HCL 0.5 % IJ SOLN
0.3300 mL | INTRAMUSCULAR | Status: AC | PRN
  Administered 2022-04-13: .33 mL

## 2022-04-13 MED ORDER — BUPIVACAINE HCL 0.5 % IJ SOLN
2.0000 mL | INTRAMUSCULAR | Status: AC | PRN
  Administered 2022-04-13: 2 mL via INTRA_ARTICULAR

## 2022-04-13 MED ORDER — METHYLPREDNISOLONE ACETATE 40 MG/ML IJ SUSP
13.3300 mg | INTRAMUSCULAR | Status: AC | PRN
  Administered 2022-04-13: 13.33 mg

## 2022-04-13 NOTE — Telephone Encounter (Signed)
VOB submitted for Monovisc, right knee. Appt.needs to be after 05/14/2022.

## 2022-06-01 DIAGNOSIS — M461 Sacroiliitis, not elsewhere classified: Secondary | ICD-10-CM | POA: Diagnosis not present

## 2022-06-24 ENCOUNTER — Ambulatory Visit (INDEPENDENT_AMBULATORY_CARE_PROVIDER_SITE_OTHER): Payer: Medicare PPO | Admitting: Pulmonary Disease

## 2022-06-24 ENCOUNTER — Encounter (HOSPITAL_BASED_OUTPATIENT_CLINIC_OR_DEPARTMENT_OTHER): Payer: Self-pay | Admitting: Pulmonary Disease

## 2022-06-24 VITALS — BP 122/78 | HR 91 | Temp 98.4°F | Ht 67.0 in | Wt 219.4 lb

## 2022-06-24 DIAGNOSIS — J4550 Severe persistent asthma, uncomplicated: Secondary | ICD-10-CM

## 2022-06-24 NOTE — Progress Notes (Signed)
[facility]  Chief Complaint  Patient presents with   Follow-up    No New Issues since LOV.   Constitutional:  BP 122/78 (BP Location: Right Arm, Patient Position: Sitting, Cuff Size: Large)   Pulse 91   Temp 98.4 F (36.9 C) (Oral)   Ht '5\' 7"'$  (1.702 m)   Wt 219 lb 6.4 oz (99.5 kg)   SpO2 96%   BMI 34.36 kg/m   Past Medical History:  Low T, HTN, HLD, HA, GERD, BPH, OA, COVID 19 infection January 2022  Past Surgical History:  He  has a past surgical history that includes Lumbar laminectomy/decompression microdiscectomy (Right, 11/29/2012); Shoulder arthroscopy w/ acromial repair (Left, 08-28-2002); Cystoscopy with urethral dilatation (N/A, 01/07/2013); Colonoscopy; Polypectomy; Upper gastrointestinal endoscopy; Back surgery; Total knee arthroplasty (Left, 12/02/2019); and Total knee arthroplasty (Left, 12/02/2019).  Brief Summary:  Douglas Carpenter is a 66 y.o. male former smoker with chronic cough in setting of allergic asthma, Post nasal drip, GERD, and vocal cord disease.  He also has hx of snoring.      Subjective:   He was in [employer] in the 1990's.  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  At the time he developed cough, wheeze, sputum and shortness of breath.  Since then he has progressed to developed asthma requiring maintenance inhaler therapy.  Physical Exam:   Appearance - well kempt   ENMT - no sinus tenderness, no oral exudate, no LAN, Mallampati 3 airway, no stridor  Respiratory - equal breath sounds bilaterally, no wheezing or rales  CV - s1s2 regular rate and rhythm, no murmurs  Ext - no clubbing, no edema  Skin - no rashes  Psych - normal mood and affect     Pulmonary testing:  PFT 02/19/09 >> FEV1 2.90 (90%), FEV1% 70, TLC 5.62(93%), DLCO 99%, +BD PFT 07/21/14 >> FEV1 2.70 (93%), FEV1% 74, TLC 5.97 (91%), RV 2.65 (126%), DLCO 106%, +BD RAST 07/21/14 >> react to dust mites, grasses; IgE 49 FeNO 10/27/15  >> 228  Sleep Tests:  HST 03/07/13 >> AHI 3.9, SaO2 low 80%  Cardiac Tests:  Echo 12/14/20 >> EF 55 to 60%  Social History:  He  reports that he quit smoking about 34 years ago. His smoking use included cigarettes. He has a 11.00 pack-year smoking history. He has never used smokeless tobacco. He reports current alcohol use of about 21.0 standard drinks of alcohol per week. He reports that he does not use drugs.  Family History:  His family history includes Cancer in his maternal grandfather and mother; Heart disease in his maternal grandfather and maternal grandmother; Lung cancer in his mother.     Assessment/Plan:   Allergic asthma with exercise induced bronchoconstriction. - he gets scripts from the [place] - he has frequent exacerbations, and these happen typical with onset of Fall and Spring - continue wixela and singulair - prn [facility]er[facility]  Upper airway cough syndrome. - continue singulair with prn claritin   Allergic conjunctivitis. - prn pataday   Time Spent Involved in Patient Care on Day of Examination:  10 minutes  Follow up:   Patient Instructions  Follow up in 6 months  Medication List:   Allergies as of 06/24/2022       Reactions   Aspirin Rash   Pt can tolerate low doses-- INTOLERANT TO HIGHER DOSES        Medication List  Accurate as of June 24, 2022  9:53 AM. If you have any questions, ask your nurse or doctor.          Aimovig 70 MG/ML Soaj Generic drug: Erenumab-aooe Inject 70 mg into the skin every 30 (thirty) days.   albuterol 108 (90 Base) MCG/ACT inhaler Commonly known as: ProAir HFA Inhale 2 puffs into the lungs every 6 (six) hours as needed for wheezing or shortness of breath.   albuterol (2.5 MG/3ML) 0.083% nebulizer solution Commonly known as: PROVENTIL Take 3 mLs (2.5 mg total) by nebulization every 6 (six) hours as needed for wheezing or shortness of breath.   aspirin EC 81 MG  tablet Take 1 tablet (81 mg total) by mouth 2 (two) times daily.   atorvastatin 80 MG tablet Commonly known as: LIPITOR Take 1 tablet (80 mg total) by mouth at bedtime.   carvedilol 12.5 MG tablet Commonly known as: COREG Take 1 tablet (12.5 mg total) by mouth 2 (two) times daily with a meal.   diclofenac Sodium 1 % Gel Commonly known as: Voltaren Apply 2 g topically 4 (four) times daily.   fluticasone-salmeterol 250-50 MCG/ACT Aepb Commonly known as: Wixela Inhub Inhale 1 puff into the lungs in the morning and at bedtime.   gabapentin 600 MG tablet Commonly known as: NEURONTIN Take 600 mg by mouth 3 (three) times daily.   indapamide 2.5 MG tablet Commonly known as: LOZOL Take 1 tablet (2.5 mg total) by mouth daily.   montelukast 10 MG tablet Commonly known as: SINGULAIR Take 1 tablet (10 mg total) by mouth at bedtime.   olopatadine 0.1 % ophthalmic solution Commonly known as: Pataday Place 1 drop into both eyes 2 (two) times daily.   omeprazole 40 MG capsule Commonly known as: PRILOSEC Take 40 mg by mouth daily.   ondansetron 4 MG tablet Commonly known as: Zofran Take 1 tablet (4 mg total) by mouth every 8 (eight) hours as needed for nausea or vomiting.   potassium chloride SA 20 MEQ tablet Commonly known as: Klor-Con M20 Take 1 tablet (20 mEq total) by mouth 2 (two) times daily.        Signature:  [name], MD [place] Pager - [phone] 06/24/2022, 9:53 AM

## 2022-06-24 NOTE — Patient Instructions (Signed)
Follow up in 6 months 

## 2022-06-29 DIAGNOSIS — N5201 Erectile dysfunction due to arterial insufficiency: Secondary | ICD-10-CM | POA: Diagnosis not present

## 2022-06-29 DIAGNOSIS — N430 Encysted hydrocele: Secondary | ICD-10-CM | POA: Diagnosis not present

## 2022-07-06 ENCOUNTER — Telehealth: Payer: Self-pay

## 2022-07-06 NOTE — Telephone Encounter (Signed)
VOB resubmitted for Monovisc, right knee due to new year.

## 2022-07-13 DIAGNOSIS — Z6835 Body mass index (BMI) 35.0-35.9, adult: Secondary | ICD-10-CM | POA: Diagnosis not present

## 2022-07-13 DIAGNOSIS — M461 Sacroiliitis, not elsewhere classified: Secondary | ICD-10-CM | POA: Diagnosis not present

## 2022-07-13 DIAGNOSIS — M5134 Other intervertebral disc degeneration, thoracic region: Secondary | ICD-10-CM | POA: Diagnosis not present

## 2022-07-13 DIAGNOSIS — M5416 Radiculopathy, lumbar region: Secondary | ICD-10-CM | POA: Diagnosis not present

## 2022-07-15 ENCOUNTER — Other Ambulatory Visit: Payer: Self-pay

## 2022-07-15 DIAGNOSIS — M1711 Unilateral primary osteoarthritis, right knee: Secondary | ICD-10-CM

## 2022-07-19 ENCOUNTER — Encounter: Payer: Self-pay | Admitting: Orthopaedic Surgery

## 2022-07-19 ENCOUNTER — Ambulatory Visit (INDEPENDENT_AMBULATORY_CARE_PROVIDER_SITE_OTHER): Payer: Medicare PPO

## 2022-07-19 ENCOUNTER — Ambulatory Visit (INDEPENDENT_AMBULATORY_CARE_PROVIDER_SITE_OTHER): Payer: Medicare PPO | Admitting: Orthopaedic Surgery

## 2022-07-19 ENCOUNTER — Ambulatory Visit: Payer: Medicare PPO | Admitting: Orthopaedic Surgery

## 2022-07-19 DIAGNOSIS — M1711 Unilateral primary osteoarthritis, right knee: Secondary | ICD-10-CM

## 2022-07-19 DIAGNOSIS — M25571 Pain in right ankle and joints of right foot: Secondary | ICD-10-CM

## 2022-07-19 DIAGNOSIS — M25572 Pain in left ankle and joints of left foot: Secondary | ICD-10-CM | POA: Diagnosis not present

## 2022-07-19 NOTE — Progress Notes (Unsigned)
Office Visit Note   Patient: Douglas Carpenter           Date of Birth: 1955-12-31           MRN: [id number] Visit Date: 07/19/2022              Requested by: [name], MD [address] [place],  [place] [address] PCP: [name], MD   Assessment & Plan: Visit Diagnoses:  1. Pain in right ankle and joints of right foot   2. Pain in left ankle and joints of left foot   3. Unilateral primary osteoarthritis, right knee     Plan: Impression is right knee osteoarthritis and bilateral ankle posterior tibial tendinitis and peroneal tendinitis on the left.  In regards to his knee, we injected this with Monovisc today.  He tolerated this well.  In regards to the ankles,  Monovisc injection lot #[id number]; expiration date 01/24/2025  Follow-Up Instructions: Return if symptoms worsen or fail to improve.   Orders:  Orders Placed This Encounter  Procedures   XR Ankle Complete Right   No orders of the defined types were placed in this encounter.     Procedures: No procedures performed   Clinical Data: No additional findings.   Subjective: Chief Complaint  Patient presents with   Right Knee - Follow-up    monovisc   Right Ankle - Pain    HPI patient is a pleasant 67 year old gentleman who comes in today with right knee osteoarthritis as well as bilateral ankle pain.  In regards to his knee, pain has been ongoing for a while.  He has undergone previous viscosupplementation injections with good but temporary relief.  He is here today for right knee Monovisc injection.  In regards to his ankles he has had pain for several years which has been intermittent.  The pain is throughout the entire aspect of both ankles worse going from a seated to standing position as well as walking.  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  He has tried wearing a boot as well as ASO brace for weeks at a time which provided relief only when in a device.  Soon as he comes out his pain  returns.  Review of Systems as detailed in HPI.  All others reviewed and are negative.   Objective: Vital Signs: There were no vitals taken for this visit.  Physical Exam well-developed and well-nourished gentleman in no acute distress.  Alert and oriented x 3.  Ortho Exam stable right knee exam.  Right ankle shows moderate tenderness along the posterior tibial tendon.  Painless range of motion.  Left ankle exam shows mild tenderness along the peroneal and posterior tibial tendons.  Increased pain with resisted inversion.  He is neurovascular intact distally.    Specialty Comments:  No specialty comments available.  Imaging: XR Ankle Complete Right  Result Date: 07/19/2022 X-rays show mild tarsal bossing    PMFS History: Patient Active Problem List   Diagnosis Date Noted   Right carpal tunnel syndrome 11/11/2021   Acute asthmatic bronchitis 06/14/2021   Other headache syndrome 05/17/2021   Body aches 05/17/2021   Acute cough 05/17/2021   Shortness of breath 05/17/2021   Influenza A 05/17/2021   Arthritis of carpometacarpal [facility]) joint of right thumb 04/05/2021   Bilateral hand numbness 04/05/2021   Type II diabetes mellitus with manifestations ([place]) 03/18/2021   Diuretic-induced hypokalemia 10/22/2020   Encounter for general adult medical examination  with abnormal findings 08/05/2020   Rising PSA level 08/04/2020   Stage 3a chronic kidney disease ([place]) 08/04/2020   Prediabetes 08/03/2020   Primary hypertension 08/03/2020   Benign prostatic hyperplasia without lower urinary tract symptoms 08/03/2020   Hyperlipidemia with target LDL less than 130 08/03/2020   Primary osteoarthritis of left knee 04/16/2019   Primary osteoarthritis of right knee 02/20/2019   Exercise-induced asthma 07/21/2014   Chronic cough 07/21/2014   Upper airway cough syndrome 07/21/2014   Allergic rhinitis 07/21/2014   Lumbar stenosis 11/06/2013   Urethral stricture 01/07/2013   VOCAL CORD DISORDER  02/19/2009   Asthma 01/27/2009   GERD 01/27/2009   Past Medical History:  Diagnosis Date   Allergy    Arthritis    "everywhere"   BPH (benign prostatic hypertrophy)    Chronic rhinitis 01/27/2009   ED (erectile dysfunction)    GERD (gastroesophageal reflux disease)    Headache(784.0)    migraines, as many as 3 in a week     HYPERLIPIDEMIA 01/27/2009   HYPERTENSION 01/27/2009   Mild asthma    seasonal allergies, uses Proair once per yr.    Mild obstructive sleep apnea    per pt -- mild osa test result--  no rx cpap needed   Neuromuscular disorder (HCC)    Sleep apnea    no cpap   SOB (shortness of breath)    Testosterone deficiency    Urethral stricture    Vocal cord anomaly    pt states told from [place]  doctor heard a little gurgle sound -- no farther testing done    Family History  Problem Relation Age of Onset   Cancer Mother        lung   Lung cancer Mother    Heart disease Maternal Grandmother    Heart disease Maternal Grandfather    Cancer Maternal Grandfather        colon, prostate,lung   Colon cancer Neg Hx    Colon polyps Neg Hx    Esophageal cancer Neg Hx    Rectal cancer Neg Hx    Stomach cancer Neg Hx     Past Surgical History:  Procedure Laterality Date   BACK SURGERY     COLONOSCOPY     CYSTOSCOPY WITH URETHRAL DILATATION N/A 01/07/2013   Procedure: CYSTOSCOPY WITH URETHRAL DILATATION BALLOON DILATION ;  Surgeon: Bernestine Amass, MD;  Location: [facility];  Service: Urology;  Laterality: N/A;   LUMBAR LAMINECTOMY/DECOMPRESSION MICRODISCECTOMY Right 11/29/2012   Procedure: Right Lumbar Four to Five Laminectomy/ Decompression Microdiskectomy;  Surgeon: Otilio Connors, MD;  Location: [facility];  Service: Neurosurgery;  Laterality: Right;  LUMBAR LAMINECTOMY/DECOMPRESSION MICRODISCECTOMY 1 LEVEL   POLYPECTOMY     SHOULDER ARTHROSCOPY W/ ACROMIAL REPAIR Left 08-28-2002   TOTAL KNEE ARTHROPLASTY Left 12/02/2019   TOTAL KNEE ARTHROPLASTY Left  12/02/2019   Procedure: LEFT TOTAL KNEE ARTHROPLASTY;  Surgeon: Leandrew Koyanagi, MD;  Location: [facility];  Service: Orthopedics;  Laterality: Left;   UPPER GASTROINTESTINAL ENDOSCOPY     Social History   Occupational History   Occupation: retired  Tobacco Use   Smoking status: Former    Packs/day: 0.50    Years: 22.00    Total pack years: 11.00    Types: Cigarettes    Quit date: 06/27/1988    Years since quitting: 34.0   Smokeless tobacco: Never  Vaping Use   Vaping Use: Never used  Substance and Sexual Activity   Alcohol use:  Yes    Alcohol/week: 21.0 standard drinks of alcohol    Types: 21 Shots of liquor per week    Comment: cognac   Drug use: No   Sexual activity: Yes    Partners: Female

## 2022-07-20 ENCOUNTER — Ambulatory Visit (INDEPENDENT_AMBULATORY_CARE_PROVIDER_SITE_OTHER): Payer: Medicare PPO

## 2022-07-20 ENCOUNTER — Ambulatory Visit (INDEPENDENT_AMBULATORY_CARE_PROVIDER_SITE_OTHER): Payer: Medicare PPO | Admitting: Orthopaedic Surgery

## 2022-07-20 DIAGNOSIS — M25572 Pain in left ankle and joints of left foot: Secondary | ICD-10-CM | POA: Diagnosis not present

## 2022-07-20 DIAGNOSIS — M1811 Unilateral primary osteoarthritis of first carpometacarpal joint, right hand: Secondary | ICD-10-CM | POA: Diagnosis not present

## 2022-07-20 MED ORDER — DICLOFENAC SODIUM 75 MG PO TBEC: 75.0000 mg | DELAYED_RELEASE_TABLET | Freq: Two times a day (BID) | ORAL | 2 refills | Status: DC

## 2022-07-20 NOTE — Progress Notes (Signed)
Office Visit Note   Patient: Douglas Carpenter           Date of Birth: [birth year]           MRN: [id number] Visit Date: 07/20/2022              Requested by: [name], MD [address] [place],  [place] [address] PCP: [name], MD   Assessment & Plan: Visit Diagnoses:  1. Pain in left ankle and joints of left foot   2. Primary osteoarthritis of first carpometacarpal joint of right hand     Plan: Impression is right thumb CMC arthritis and mild left ankle osteoarthritis.  X-rays and findings reviewed with the patient treatments discussed.  I will send in prescription for oral diclofenac.  He will use a supportive CMC brace as needed.  Will see him back as needed.  In regards to the right knee he underwent Visco injection yesterday and he is experiencing a postinjection flareup.  Treatments discussed.  Follow-Up Instructions: No follow-ups on file.   Orders:  Orders Placed This Encounter  Procedures   XR Ankle Complete Left   Meds ordered this encounter  Medications   DISCONTD: diclofenac (VOLTAREN) 75 MG EC tablet    Sig: Take 1 tablet (75 mg total) by mouth 2 (two) times daily.    Dispense:  30 tablet    Refill:  2   diclofenac (VOLTAREN) 75 MG EC tablet    Sig: Take 1 tablet (75 mg total) by mouth 2 (two) times daily.    Dispense:  30 tablet    Refill:  2      Procedures: No procedures performed   Clinical Data: No additional findings.   Subjective: Chief Complaint  Patient presents with   Left Ankle - Pain   Right Hand - Pain    HPI Douglas Carpenter is coming in today for evaluation of right thumb pain and left ankle pain.  In regards to the right hand and thumb hurts at the base of the [facility] joint.  He has pain when he grasps or grips something.  For the left ankle he has medial sided pain just around the medial malleolus.  It is worse with activity.  This is progressively gotten worse. Review of Systems  Constitutional: Negative.   HENT: Negative.     Eyes: Negative.   Respiratory: Negative.    Cardiovascular: Negative.   Gastrointestinal: Negative.   Endocrine: Negative.   Genitourinary: Negative.   Skin: Negative.   Allergic/Immunologic: Negative.   Neurological: Negative.   Hematological: Negative.   Psychiatric/Behavioral: Negative.    All other systems reviewed and are negative.    Objective: Vital Signs: There were no vitals taken for this visit.  Physical Exam Vitals and nursing note reviewed.  Constitutional:      Appearance: He is well-developed.  Pulmonary:     Effort: Pulmonary effort is normal.  Abdominal:     Palpations: Abdomen is soft.  Skin:    General: Skin is warm.  Neurological:     Mental Status: He is alert and oriented to person, place, and time.  Psychiatric:        Behavior: Behavior normal.        Thought Content: Thought content normal.        Judgment: Judgment normal.     Ortho Exam Examination of the right thumb shows pain and crepitus with CMC grind test. Examination of the left ankle shows vague tenderness  in the medial gutter region.  There is no pain along the posterior tibial tendon.  No swelling. Specialty Comments:  No specialty comments available.  Imaging: XR Ankle Complete Left  Result Date: 07/20/2022 Mild osteoarthritis of the ankle joint    PMFS History: Patient Active Problem List   Diagnosis Date Noted   Pain in left ankle and joints of left foot 07/20/2022   Primary osteoarthritis of first carpometacarpal joint of right hand 07/20/2022   Right carpal tunnel syndrome 11/11/2021   Acute asthmatic bronchitis 06/14/2021   Other headache syndrome 05/17/2021   Body aches 05/17/2021   Acute cough 05/17/2021   Shortness of breath 05/17/2021   Influenza A 05/17/2021   Arthritis of carpometacarpal ([place]) joint of right thumb 04/05/2021   Bilateral hand numbness 04/05/2021   Type II diabetes mellitus with manifestations ([place]) 03/18/2021   Diuretic-induced  hypokalemia 10/22/2020   Encounter for general adult medical examination with abnormal findings 08/05/2020   Rising PSA level 08/04/2020   Stage 3a chronic kidney disease ([place]) 08/04/2020   Prediabetes 08/03/2020   Primary hypertension 08/03/2020   Benign prostatic hyperplasia without lower urinary tract symptoms 08/03/2020   Hyperlipidemia with target LDL less than 130 08/03/2020   Primary osteoarthritis of left knee 04/16/2019   Primary osteoarthritis of right knee 02/20/2019   Exercise-induced asthma 07/21/2014   Chronic cough 07/21/2014   Upper airway cough syndrome 07/21/2014   Allergic rhinitis 07/21/2014   Lumbar stenosis 11/06/2013   Urethral stricture 01/07/2013   VOCAL CORD DISORDER 02/19/2009   Asthma 01/27/2009   GERD 01/27/2009   Past Medical History:  Diagnosis Date   Allergy    Arthritis    "everywhere"   BPH (benign prostatic hypertrophy)    Chronic rhinitis 01/27/2009   ED (erectile dysfunction)    GERD (gastroesophageal reflux disease)    Headache(784.0)    migraines, as many as 3 in a week     HYPERLIPIDEMIA 01/27/2009   HYPERTENSION 01/27/2009   Mild asthma    seasonal allergies, uses Proair once per yr.    Mild obstructive sleep apnea    per pt -- mild osa test result--  no rx cpap needed   Neuromuscular disorder (HCC)    Sleep apnea    no cpap   SOB (shortness of breath)    Testosterone deficiency    Urethral stricture    Vocal cord anomaly    pt states told from [place]  doctor heard a little gurgle sound -- no farther testing done    Family History  Problem Relation Age of Onset   Cancer Mother        lung   Lung cancer Mother    Heart disease Maternal Grandmother    Heart disease Maternal Grandfather    Cancer Maternal Grandfather        colon, prostate,lung   Colon cancer Neg Hx    Colon polyps Neg Hx    Esophageal cancer Neg Hx    Rectal cancer Neg Hx    Stomach cancer Neg Hx     Past Surgical History:  Procedure Laterality Date    BACK SURGERY     COLONOSCOPY     CYSTOSCOPY WITH URETHRAL DILATATION N/A 01/07/2013   Procedure: CYSTOSCOPY WITH URETHRAL DILATATION BALLOON DILATION ;  Surgeon: Bernestine Amass, MD;  Location[facility];  Service: Urology;  Laterality: N/A;   LUMBAR LAMINECTOMY/DECOMPRESSION MICRODISCECTOMY Right 11/29/2012   Procedure: Right Lumbar Four to Five Laminectomy/ Decompression Microdiskectomy;  Surgeon: Otilio Connors, MD;  Location: [facility];  Service: Neurosurgery;  Laterality: Right;  LUMBAR LAMINECTOMY/DECOMPRESSION MICRODISCECTOMY 1 LEVEL   POLYPECTOMY     SHOULDER ARTHROSCOPY W/ ACROMIAL REPAIR Left 08-28-2002   TOTAL KNEE ARTHROPLASTY Left 12/02/2019   TOTAL KNEE ARTHROPLASTY Left 12/02/2019   Procedure: LEFT TOTAL KNEE ARTHROPLASTY;  Surgeon: Leandrew Koyanagi, MD;  Location: [facility];  Service: Orthopedics;  Laterality: Left;   UPPER GASTROINTESTINAL ENDOSCOPY     Social History   Occupational History   Occupation: retired  Tobacco Use   Smoking status: Former    Packs/day: 0.50    Years: 22.00    Total pack years: 11.00    Types: Cigarettes    Quit date: 06/27/1988    Years since quitting: 34.0   Smokeless tobacco: Never  Vaping Use   Vaping Use: Never used  Substance and Sexual Activity   Alcohol use: Yes    Alcohol/week: 21.0 standard drinks of alcohol    Types: 21 Shots of liquor per week    Comment: cognac   Drug use: No   Sexual activity: Yes    Partners: Female

## 2022-07-21 DIAGNOSIS — M1711 Unilateral primary osteoarthritis, right knee: Secondary | ICD-10-CM | POA: Diagnosis not present

## 2022-07-21 DIAGNOSIS — M25571 Pain in right ankle and joints of right foot: Secondary | ICD-10-CM | POA: Diagnosis not present

## 2022-07-21 DIAGNOSIS — M25572 Pain in left ankle and joints of left foot: Secondary | ICD-10-CM | POA: Diagnosis not present

## 2022-07-21 MED ORDER — HYALURONAN 88 MG/4ML IX SOSY
88.0000 mg | PREFILLED_SYRINGE | INTRA_ARTICULAR | Status: AC | PRN
  Administered 2022-07-21: 88 mg via INTRA_ARTICULAR

## 2022-08-01 DIAGNOSIS — Z79899 Other long term (current) drug therapy: Secondary | ICD-10-CM | POA: Diagnosis not present

## 2022-08-05 DIAGNOSIS — M5414 Radiculopathy, thoracic region: Secondary | ICD-10-CM | POA: Diagnosis not present

## 2022-08-11 DIAGNOSIS — Z0289 Encounter for other administrative examinations: Secondary | ICD-10-CM | POA: Diagnosis not present

## 2022-08-11 DIAGNOSIS — L853 Xerosis cutis: Secondary | ICD-10-CM | POA: Diagnosis not present

## 2022-08-11 DIAGNOSIS — I10 Essential (primary) hypertension: Secondary | ICD-10-CM | POA: Diagnosis not present

## 2022-08-11 DIAGNOSIS — I5020 Unspecified systolic (congestive) heart failure: Secondary | ICD-10-CM | POA: Diagnosis not present

## 2022-08-11 DIAGNOSIS — M5442 Lumbago with sciatica, left side: Secondary | ICD-10-CM | POA: Diagnosis not present

## 2022-08-11 DIAGNOSIS — I700 Atherosclerosis of aorta: Secondary | ICD-10-CM | POA: Diagnosis not present

## 2022-08-11 DIAGNOSIS — E669 Obesity, unspecified: Secondary | ICD-10-CM | POA: Diagnosis not present

## 2022-08-11 DIAGNOSIS — R7303 Prediabetes: Secondary | ICD-10-CM | POA: Diagnosis not present

## 2022-09-28 ENCOUNTER — Ambulatory Visit (INDEPENDENT_AMBULATORY_CARE_PROVIDER_SITE_OTHER): Payer: Medicare PPO | Admitting: Podiatry

## 2022-09-28 ENCOUNTER — Encounter: Payer: Self-pay | Admitting: Podiatry

## 2022-09-28 DIAGNOSIS — M79674 Pain in right toe(s): Secondary | ICD-10-CM | POA: Diagnosis not present

## 2022-09-28 DIAGNOSIS — M7752 Other enthesopathy of left foot: Secondary | ICD-10-CM

## 2022-09-28 DIAGNOSIS — M7751 Other enthesopathy of right foot: Secondary | ICD-10-CM

## 2022-09-28 DIAGNOSIS — L309 Dermatitis, unspecified: Secondary | ICD-10-CM | POA: Diagnosis not present

## 2022-09-28 DIAGNOSIS — B351 Tinea unguium: Secondary | ICD-10-CM | POA: Diagnosis not present

## 2022-09-28 DIAGNOSIS — M79675 Pain in left toe(s): Secondary | ICD-10-CM | POA: Diagnosis not present

## 2022-09-28 MED ORDER — TRIAMCINOLONE ACETONIDE 10 MG/ML IJ SUSP
20.0000 mg | Freq: Once | INTRAMUSCULAR | Status: AC
  Administered 2022-09-28: 20 mg

## 2022-09-28 MED ORDER — TERBINAFINE HCL 250 MG PO TABS: 250.0000 mg | ORAL_TABLET | Freq: Every day | ORAL | 0 refills | Status: AC

## 2022-09-28 NOTE — Progress Notes (Signed)
Subjective:   Patient ID: Douglas Carpenter, male   DOB: 67 y.o.   MRN: [id number]   HPI Patient presents with long-term history of dry skin right and inflammation pain in the ankle region bilateral and also has some thickness of his nailbeds both feet that can be bothersome.  He has tried creams he gets severe itching which really drives him crazy.  Patient does not smoke likes to be active   Review of Systems  All other systems reviewed and are negative.       Objective:  Physical Exam Vitals and nursing note reviewed.  Constitutional:      Appearance: He is well-developed.  Pulmonary:     Effort: Pulmonary effort is normal.  Musculoskeletal:        General: Normal range of motion.  Skin:    General: Skin is warm.  Neurological:     Mental Status: He is alert.     Neurovascular status intact muscle strength was found to be adequate range of motion adequate with dry skin noted right over left with irritation between the toes redness around the sides and exquisite discomfort in the sinus tarsi bilateral and also thickened toenails bilateral that are painful.  Good digital perfusion well-oriented     Assessment:  Inflammatory capsulitis sinus tarsi bilateral along with mycotic nail infection 1-5 both feet with pain and probability for fungal infection right over left     Plan:  H&P all conditions reviewed sterile prep injected the sinus tarsi bilateral 3 mg Kenalog 5 mg Xylocaine and started him on Lamisil 1 pill a day for 60 days he has a liver function which was normal and debrided nailbeds painful 1-5 both feet no iatrogenic bleeding.  Reappoint as symptoms indicate and hopefully this will make a big difference for him

## 2022-11-29 ENCOUNTER — Encounter: Payer: Self-pay | Admitting: Orthopaedic Surgery

## 2022-11-29 ENCOUNTER — Other Ambulatory Visit (INDEPENDENT_AMBULATORY_CARE_PROVIDER_SITE_OTHER): Payer: Medicare PPO

## 2022-11-29 ENCOUNTER — Ambulatory Visit (INDEPENDENT_AMBULATORY_CARE_PROVIDER_SITE_OTHER): Payer: TRICARE For Life (TFL) | Admitting: Orthopaedic Surgery

## 2022-11-29 DIAGNOSIS — M25571 Pain in right ankle and joints of right foot: Secondary | ICD-10-CM | POA: Diagnosis not present

## 2022-11-29 DIAGNOSIS — M1711 Unilateral primary osteoarthritis, right knee: Secondary | ICD-10-CM | POA: Diagnosis not present

## 2022-11-29 MED ORDER — CELECOXIB 200 MG PO CAPS: 200.0000 mg | ORAL_CAPSULE | Freq: Two times a day (BID) | ORAL | 3 refills | Status: DC

## 2022-11-29 NOTE — Progress Notes (Signed)
Office Visit Note   Patient: Douglas Carpenter           Date of Birth: 1956-02-28           MRN: [id number] Visit Date: 11/29/2022              Requested by: Etta Grandchild, MD [address] [place],  [place] [address] PCP: Etta Grandchild, MD   Assessment & Plan: Visit Diagnoses:  1. Primary osteoarthritis of right knee   2. Pain in right ankle and joints of right foot     Plan: In regards to the right knee he would like to move forward with scheduling for right total knee replacement.  In regards to the right ankle he would like to try Celebrex and diclofenac gel.  He will modify activity as needed.  Impression is severe right knee degenerative joint disease secondary to Osteoarthritis.  Bone on bone joint space narrowing is seen on radiographs with neutral alignment.  At this point, conservative treatments fail to provide any significant relief and the pain is severely affecting ADLs and quality of life.  Based on treatment options, the patient has elected to move forward with a knee replacement.  We have discussed the surgical risks that include but are not limited to infection, DVT, leg length discrepancy, stiffness, numbness, tingling, incomplete relief of pain.  Recovery and prognosis were also reviewed.    Anticoagulants: aspirin 81 mg daily Postop anticoagulation: Aspirin 81 mg Diabetic: No  Nickel allergy: No Prior DVT/PE: No Tobacco use: No Clearances needed for surgery: None Anticipated discharge dispo: Home   Follow-Up Instructions: No follow-ups on file.   Orders:  Orders Placed This Encounter  Procedures   XR KNEE 3 VIEW RIGHT   Meds ordered this encounter  Medications   celecoxib (CELEBREX) 200 MG capsule    Sig: Take 1 capsule (200 mg total) by mouth 2 (two) times daily.    Dispense:  30 capsule    Refill:  3      Procedures: No procedures performed   Clinical Data: No additional findings.   Subjective: Chief Complaint  Patient presents with    Right Knee - Pain    HPI Douglas Carpenter returns today for follow-up on right knee DJD and right ankle pain.  Feels that they are both related.  For the right knee we have been seeing him for many years and he has had over 10 cortisone injections.  He feels like at this point he is ready to schedule total knee replacement.  For the ankle he feels pain to the lateral gutter region.  Denies any injuries.  Feels like he has sharp shooting pain with certain twisting motions. Review of Systems  Constitutional: Negative.   HENT: Negative.    Eyes: Negative.   Respiratory: Negative.    Cardiovascular: Negative.   Gastrointestinal: Negative.   Endocrine: Negative.   Genitourinary: Negative.   Skin: Negative.   Allergic/Immunologic: Negative.   Neurological: Negative.   Hematological: Negative.   Psychiatric/Behavioral: Negative.    All other systems reviewed and are negative.    Objective: Vital Signs: There were no vitals taken for this visit.  Physical Exam Vitals and nursing note reviewed.  Constitutional:      Appearance: He is well-developed.  Pulmonary:     Effort: Pulmonary effort is normal.  Abdominal:     Palpations: Abdomen is soft.  Skin:    General: Skin is warm.  Neurological:  Mental Status: He is alert and oriented to person, place, and time.  Psychiatric:        Behavior: Behavior normal.        Thought Content: Thought content normal.        Judgment: Judgment normal.     Ortho Exam Examination of the right knee shows global pain.  2+ patellofemoral crepitus with range of motion.  Collaterals and cruciates are stable.  Joint line tenderness.  Examination right ankle shows trace effusion.  He has tenderness to the lateral gutter.  Motion is preserved. Specialty Comments:  No specialty comments available.  Imaging: No results found.   PMFS History: Patient Active Problem List   Diagnosis Date Noted   Pain in left ankle and joints of left foot  07/20/2022   Primary osteoarthritis of first carpometacarpal joint of right hand 07/20/2022   Right carpal tunnel syndrome 11/11/2021   Acute asthmatic bronchitis 06/14/2021   Other headache syndrome 05/17/2021   Body aches 05/17/2021   Acute cough 05/17/2021   Shortness of breath 05/17/2021   Influenza A 05/17/2021   Arthritis of carpometacarpal (CMC) joint of right thumb 04/05/2021   Bilateral hand numbness 04/05/2021   Type II diabetes mellitus with manifestations (HCC) 03/18/2021   Diuretic-induced hypokalemia 10/22/2020   Encounter for general adult medical examination with abnormal findings 08/05/2020   Rising PSA level 08/04/2020   Stage 3a chronic kidney disease (HCC) 08/04/2020   Prediabetes 08/03/2020   Primary hypertension 08/03/2020   Benign prostatic hyperplasia without lower urinary tract symptoms 08/03/2020   Hyperlipidemia with target LDL less than 130 08/03/2020   Primary osteoarthritis of left knee 04/16/2019   Primary osteoarthritis of right knee 02/20/2019   Exercise-induced asthma 07/21/2014   Chronic cough 07/21/2014   Upper airway cough syndrome 07/21/2014   Allergic rhinitis 07/21/2014   Lumbar stenosis 11/06/2013   Urethral stricture 01/07/2013   VOCAL CORD DISORDER 02/19/2009   Asthma 01/27/2009   GERD 01/27/2009   Past Medical History:  Diagnosis Date   Allergy    Arthritis    "everywhere"   BPH (benign prostatic hypertrophy)    Chronic rhinitis 01/27/2009   ED (erectile dysfunction)    GERD (gastroesophageal reflux disease)    Headache(784.0)    migraines, as many as 3 in a week     HYPERLIPIDEMIA 01/27/2009   HYPERTENSION 01/27/2009   Mild asthma    seasonal allergies, uses Proair once per yr.    Mild obstructive sleep apnea    per pt -- mild osa test result--  no rx cpap needed   Neuromuscular disorder (HCC)    Sleep apnea    no cpap   SOB (shortness of breath)    Testosterone deficiency    Urethral stricture    Vocal cord anomaly     pt states told from [place]  doctor heard a little gurgle sound -- no farther testing done    Family History  Problem Relation Age of Onset   Cancer Mother        lung   Lung cancer Mother    Heart disease Maternal Grandmother    Heart disease Maternal Grandfather    Cancer Maternal Grandfather        colon, prostate,lung   Colon cancer Neg Hx    Colon polyps Neg Hx    Esophageal cancer Neg Hx    Rectal cancer Neg Hx    Stomach cancer Neg Hx     Past Surgical History:  Procedure  Laterality Date   BACK SURGERY     COLONOSCOPY     CYSTOSCOPY WITH URETHRAL DILATATION N/A 01/07/2013   Procedure: CYSTOSCOPY WITH URETHRAL DILATATION BALLOON DILATION ;  Surgeon: Valetta Fuller, MD;  Location[facility];  Service: Urology;  Laterality: N/A;   LUMBAR LAMINECTOMY/DECOMPRESSION MICRODISCECTOMY Right 11/29/2012   Procedure: Right Lumbar Four to Five Laminectomy/ Decompression Microdiskectomy;  Surgeon: Clydene Fake, MD;  Location: [facility];  Service: Neurosurgery;  Laterality: Right;  LUMBAR LAMINECTOMY/DECOMPRESSION MICRODISCECTOMY 1 LEVEL   POLYPECTOMY     SHOULDER ARTHROSCOPY W/ ACROMIAL REPAIR Left 08-28-2002   TOTAL KNEE ARTHROPLASTY Left 12/02/2019   TOTAL KNEE ARTHROPLASTY Left 12/02/2019   Procedure: LEFT TOTAL KNEE ARTHROPLASTY;  Surgeon: Tarry Kos, MD;  Location: [facility];  Service: Orthopedics;  Laterality: Left;   UPPER GASTROINTESTINAL ENDOSCOPY     Social History   Occupational History   Occupation: retired  Tobacco Use   Smoking status: Former    Packs/day: 0.50    Years: 22.00    Additional pack years: 0.00    Total pack years: 11.00    Types: Cigarettes    Quit date: 06/27/1988    Years since quitting: 34.4   Smokeless tobacco: Never  Vaping Use   Vaping Use: Never used  Substance and Sexual Activity   Alcohol use: Yes    Alcohol/week: 21.0 standard drinks of alcohol    Types: 21 Shots of liquor per week    Comment: cognac   Drug use: No    Sexual activity: Yes    Partners: Female

## 2022-12-01 ENCOUNTER — Other Ambulatory Visit: Payer: Self-pay | Admitting: Physician Assistant

## 2022-12-01 MED ORDER — CELECOXIB 200 MG PO CAPS: 200.0000 mg | ORAL_CAPSULE | Freq: Two times a day (BID) | ORAL | 3 refills | Status: DC

## 2022-12-13 ENCOUNTER — Ambulatory Visit (INDEPENDENT_AMBULATORY_CARE_PROVIDER_SITE_OTHER): Payer: Medicare PPO | Admitting: Pulmonary Disease

## 2022-12-13 ENCOUNTER — Ambulatory Visit (HOSPITAL_BASED_OUTPATIENT_CLINIC_OR_DEPARTMENT_OTHER): Payer: TRICARE For Life (TFL)

## 2022-12-13 ENCOUNTER — Encounter (HOSPITAL_BASED_OUTPATIENT_CLINIC_OR_DEPARTMENT_OTHER): Payer: Self-pay | Admitting: Pulmonary Disease

## 2022-12-13 VITALS — BP 124/68 | HR 85 | Temp 98.7°F | Ht 67.0 in | Wt 207.0 lb

## 2022-12-13 DIAGNOSIS — J4550 Severe persistent asthma, uncomplicated: Secondary | ICD-10-CM | POA: Diagnosis not present

## 2022-12-13 DIAGNOSIS — R053 Chronic cough: Secondary | ICD-10-CM

## 2022-12-13 DIAGNOSIS — J301 Allergic rhinitis due to pollen: Secondary | ICD-10-CM

## 2022-12-13 DIAGNOSIS — H1013 Acute atopic conjunctivitis, bilateral: Secondary | ICD-10-CM | POA: Diagnosis not present

## 2022-12-13 NOTE — Patient Instructions (Signed)
Chest xray and lab tests today  Follow up in 3 months

## 2022-12-13 NOTE — Progress Notes (Signed)
[facility]  Chief Complaint  Patient presents with   Follow-up    Follow up. Patient has had a cough for over 2 months. Patient stated the cough keeps coming and leaving, it is a productive cough, and it wakes him up throughout the night.     Constitutional:  BP 124/68 (BP Location: Right Arm, Patient Position: Sitting, Cuff Size: Normal)   Pulse 85   Temp 98.7 F (37.1 C) (Oral)   Ht 5\' 7"  (1.702 m)   Wt 207 lb (93.9 kg)   SpO2 96%   BMI 32.42 kg/m   Past Medical History:  Low T, HTN, HLD, HA, GERD, BPH, OA, COVID 19 infection January 2022  Past Surgical History:  He  has a past surgical history that includes Lumbar laminectomy/decompression microdiscectomy (Right, 11/29/2012); Shoulder arthroscopy w/ acromial repair (Left, 08-28-2002); Cystoscopy with urethral dilatation (N/A, 01/07/2013); Colonoscopy; Polypectomy; Upper gastrointestinal endoscopy; Back surgery; Total knee arthroplasty (Left, 12/02/2019); and Total knee arthroplasty (Left, 12/02/2019).  Brief Summary:  Douglas Carpenter is a 67 y.o. male former smoker with chronic cough in setting of allergic asthma, Post nasal drip, GERD, and vocal cord disease.  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      Subjective:   Changed his diet.  Has lost about 25 lbs.  Energy level better and sleeping better.  Still has cough with phlegm.  Sometimes clear, sometimes yellow.  Has to use his albuterol several times per week.  Physical Exam:   Appearance - well kempt   ENMT - no sinus tenderness, no oral exudate, no LAN, Mallampati 3 airway, no stridor  Respiratory - equal breath sounds bilaterally, no wheezing or rales  CV - s1s2 regular rate and rhythm, no murmurs  Ext - no clubbing, no edema  Skin - no rashes  Psych - normal mood and affect      Pulmonary testing:  PFT 02/19/09 >> FEV1 2.90 (90%), FEV1% 70, TLC 5.62(93%), DLCO 99%, +BD PFT 07/21/14 >> FEV1 2.70  (93%), FEV1% 74, TLC 5.97 (91%), RV 2.65 (126%), DLCO 106%, +BD RAST 07/21/14 >> react to dust mites, grasses; IgE 49 FeNO 10/27/15 >> 228  Sleep Tests:  HST 03/07/13 >> AHI 3.9, SaO2 low 80%  Cardiac Tests:  Echo 12/14/20 >> EF 55 to 60%  Social History:  He  reports that he quit smoking about 34 years ago. His smoking use included cigarettes. He has a 11.00 pack-year smoking history. He has never used smokeless tobacco. He reports current alcohol use of about 21.0 standard drinks of alcohol per week. He reports that he does not use drugs.  Family History:  His family history includes Cancer in his maternal grandfather and mother; Heart disease in his maternal grandfather and maternal grandmother; Lung cancer in his mother.     Assessment/Plan:   Allergic asthma with exercise induced bronchoconstriction. - he gets scripts from the [place] - he has frequent exacerbations, and these happen typical with onset of Fall and Spring - continue wixela, singulair - prn albuterol - check CBC with diff, and IgE - if his symptoms aren't better controlled, then need to consider biologic agent  Upper airway cough syndrome. - continue singulair with prn claritin   Allergic conjunctivitis. - prn pataday   Time Spent Involved in Patient Care on Day of Examination:  35 minutes  Follow up:   Patient Instructions  Chest xray and lab tests today  Follow up in 3  months  Medication List:   Allergies as of 12/13/2022       Reactions   Aspirin Rash   Pt can tolerate low doses-- INTOLERANT TO HIGHER DOSES        Medication List        Accurate as of December 13, 2022  9:33 AM. If you have any questions, ask your nurse or doctor.          STOP taking these medications    ondansetron 4 MG tablet Commonly known as: Zofran Stopped by: Coralyn Helling, MD       TAKE these medications    Aimovig 70 MG/ML Soaj Generic drug: Erenumab-aooe Inject 70 mg into the skin every 30 (thirty) days.    albuterol 108 (90 Base) MCG/ACT inhaler Commonly known as: ProAir HFA Inhale 2 puffs into the lungs every 6 (six) hours as needed for wheezing or shortness of breath.   albuterol (2.5 MG/3ML) 0.083% nebulizer solution Commonly known as: PROVENTIL Take 3 mLs (2.5 mg total) by nebulization every 6 (six) hours as needed for wheezing or shortness of breath.   aspirin EC 81 MG tablet Take 1 tablet (81 mg total) by mouth 2 (two) times daily.   atorvastatin 80 MG tablet Commonly known as: LIPITOR Take 1 tablet (80 mg total) by mouth at bedtime.   carvedilol 12.5 MG tablet Commonly known as: COREG Take 1 tablet (12.5 mg total) by mouth 2 (two) times daily with a meal.   celecoxib 200 MG capsule Commonly known as: CELEBREX Take 1 capsule (200 mg total) by mouth 2 (two) times daily.   diclofenac 75 MG EC tablet Commonly known as: VOLTAREN Take 1 tablet (75 mg total) by mouth 2 (two) times daily.   diclofenac Sodium 1 % Gel Commonly known as: Voltaren Apply 2 g topically 4 (four) times daily.   fluticasone-salmeterol 250-50 MCG/ACT Aepb Commonly known as: Wixela Inhub Inhale 1 puff into the lungs in the morning and at bedtime.   gabapentin 600 MG tablet Commonly known as: NEURONTIN Take 600 mg by mouth 3 (three) times daily.   indapamide 2.5 MG tablet Commonly known as: LOZOL Take 1 tablet (2.5 mg total) by mouth daily.   montelukast 10 MG tablet Commonly known as: SINGULAIR Take 1 tablet (10 mg total) by mouth at bedtime.   olopatadine 0.1 % ophthalmic solution Commonly known as: Pataday Place 1 drop into both eyes 2 (two) times daily.   omeprazole 40 MG capsule Commonly known as: PRILOSEC Take 40 mg by mouth daily.   potassium chloride SA 20 MEQ tablet Commonly known as: Klor-Con M20 Take 1 tablet (20 mEq total) by mouth 2 (two) times daily.   terbinafine 250 MG tablet Commonly known as: LamISIL Take 1 tablet (250 mg total) by mouth daily.        Signature:   Coralyn Helling, MD [facility] Pager - [phone] 12/13/2022, 9:33 AM

## 2022-12-15 LAB — CBC WITH DIFFERENTIAL/PLATELET
Basophils Absolute: 0 10*3/uL (ref 0.0–0.2)
Basos: 0 %
EOS (ABSOLUTE): 0.1 10*3/uL (ref 0.0–0.4)
Eos: 1 %
Hematocrit: 41.9 % (ref 37.5–51.0)
Hemoglobin: 13.7 g/dL (ref 13.0–17.7)
Immature Grans (Abs): 0 10*3/uL (ref 0.0–0.1)
Immature Granulocytes: 0 %
Lymphocytes Absolute: 1.4 10*3/uL (ref 0.7–3.1)
Lymphs: 29 %
MCH: 29.5 pg (ref 26.6–33.0)
MCHC: 32.7 g/dL (ref 31.5–35.7)
MCV: 90 fL (ref 79–97)
Monocytes Absolute: 0.6 10*3/uL (ref 0.1–0.9)
Monocytes: 13 %
Neutrophils Absolute: 2.7 10*3/uL (ref 1.4–7.0)
Neutrophils: 57 %
Platelets: 255 10*3/uL (ref 150–450)
RBC: 4.65 x10E6/uL (ref 4.14–5.80)
RDW: 14.1 % (ref 11.6–15.4)
WBC: 4.7 10*3/uL (ref 3.4–10.8)

## 2022-12-15 LAB — IGE: IgE (Immunoglobulin E), Serum: 76 IU/mL (ref 6–495)

## 2022-12-20 ENCOUNTER — Ambulatory Visit (HOSPITAL_BASED_OUTPATIENT_CLINIC_OR_DEPARTMENT_OTHER): Payer: TRICARE For Life (TFL) | Admitting: Pulmonary Disease

## 2023-01-30 ENCOUNTER — Ambulatory Visit: Payer: TRICARE For Life (TFL)

## 2023-02-10 NOTE — Progress Notes (Signed)
Surgical Instructions    Your procedure is scheduled on Monday August 26th.  Report to [facility] Main Entrance "A" at 7 A.M., then check in with the Admitting office.  Call this number if you have problems the morning of surgery:  [phone]   If you have any questions prior to your surgery date call [phone]: Open Monday-Friday 8am-4pm If you experience any cold or flu symptoms such as cough, fever, chills, shortness of breath, etc. between now and your scheduled surgery, please notify us at the above number     Remember:  Do not eat after midnight the night before your surgery  You may drink clear liquids until 0630 the morning of your surgery.   Clear liquids allowed are: Water, Non-Citrus Juices (without pulp), Carbonated Beverages, Clear Tea, Black Coffee ONLY (NO MILK, CREAM OR POWDERED CREAMER of any kind), and Gatorade   Enhanced Recovery after Surgery for Orthopedics Enhanced Recovery after Surgery is a protocol used to improve the stress on your body and your recovery after surgery.  Patient Instructions  The day of surgery (if you do NOT have diabetes):  Drink ONE (1) Pre-Surgery Clear Ensure by __6:30___ am the morning of surgery   This drink was given to you during your hospital  pre-op appointment visit. Nothing else to drink after completing the  Pre-Surgery Clear Ensure.          If you have questions, please contact your surgeon's office.    Take these medicines the morning of surgery with A SIP OF WATER: carvedilol (COREG) 12.5 MG tablet  fluticasone-salmeterol (WIXELA INHUB) 250-50 MCG/ACT AEPB - please bring with you to the hospital gabapentin (NEURONTIN) 300 MG capsule  indapamide (LOZOL) 2.5 MG tablet  omeprazole (PRILOSEC) 40 MG capsule  potassium chloride SA (KLOR-CON M20) 20 MEQ tablet   IF NEEDED  albuterol (PROAIR HFA) 108 (90 Base) MCG/ACT inhaler - please bring with you to the hospital albuterol (PROVENTIL) (2.5 MG/3ML) 0.083% nebulizer  solution     Follow your surgeon's instructions on when to stop Aspirin.  If no instructions were given by your surgeon then you will need to call the office to get those instructions.     As of today, STOP taking any Aspirin (unless otherwise instructed by your surgeon) Aleve, Naproxen, Ibuprofen, Motrin, Advil, Goody's, BC's, all herbal medications, fish oil, and all vitamins.           Do not wear jewelry  Do not wear lotions, powders, cologne or deodorant. Do not shave 48 hours prior to surgery.  Men may shave face and neck. Do not bring valuables to the hospital. Do not wear nail polish  [facility] is not responsible for any belongings or valuables.    Do NOT Smoke (Tobacco/Vaping)  24 hours prior to your procedure  If you use a CPAP at night, you may bring your mask for your overnight stay.   Contacts, glasses, hearing aids, dentures or partials may not be worn into surgery, please bring cases for these belongings   For patients admitted to the hospital, discharge time will be determined by your treatment team.   Patients discharged the day of surgery will not be allowed to drive home, and someone needs to stay with them for 24 hours.   SURGICAL WAITING ROOM VISITATION Patients having surgery or a procedure may have no more than 2 support people in the waiting area - these visitors may rotate.   Children under the age of 57 must have  an adult with them who is not the patient. If the patient needs to stay at the hospital during part of their recovery, the visitor guidelines for inpatient rooms apply. Pre-op nurse will coordinate an appropriate time for 1 support person to accompany patient in pre-op.  This support person may not rotate.   Please refer to [email or web address] for the visitor guidelines for Inpatients (after your surgery is over and you are in a regular room).    Special instructions:    Oral Hygiene is also  important to reduce your risk of infection.  Remember - BRUSH YOUR TEETH THE MORNING OF SURGERY WITH YOUR REGULAR TOOTHPASTE     Pre-operative 5 CHG Bath Instructions   You can play a key role in reducing the risk of infection after surgery. Your skin needs to be as free of germs as possible. You can reduce the number of germs on your skin by washing with CHG (chlorhexidine gluconate) soap before surgery. CHG is an antiseptic soap that kills germs and continues to kill germs even after washing.   DO NOT use if you have an allergy to chlorhexidine/CHG or antibacterial soaps. If your skin becomes reddened or irritated, stop using the CHG and notify one of our RNs at [phone].   Please shower with the CHG soap starting 4 days before surgery using the following schedule:     Please keep in mind the following:  DO NOT shave, including legs and underarms, starting the day of your first shower.   You may shave your face at any point before/day of surgery.  Place clean sheets on your bed the day you start using CHG soap. Use a clean washcloth (not used since being washed) for each shower. DO NOT sleep with pets once you start using the CHG.   CHG Shower Instructions:  If you choose to wash your hair and private area, wash first with your normal shampoo/soap.  After you use shampoo/soap, rinse your hair and body thoroughly to remove shampoo/soap residue.  Turn the water OFF and apply about 3 tablespoons (45 ml) of CHG soap to a CLEAN washcloth.  Apply CHG soap ONLY FROM YOUR NECK DOWN TO YOUR TOES (washing for 3-5 minutes)  DO NOT use CHG soap on face, private areas, open wounds, or sores.  Pay special attention to the area where your surgery is being performed.  If you are having back surgery, having someone wash your back for you may be helpful. Wait 2 minutes after CHG soap is applied, then you may rinse off the CHG soap.  Pat dry with a clean towel  Put on clean clothes/pajamas   If  you choose to wear lotion, please use ONLY the CHG-compatible lotions on the back of this paper.     Additional instructions for the day of surgery: DO NOT APPLY any lotions, deodorants, cologne, or perfumes.   Put on clean/comfortable clothes.  Brush your teeth.  Ask your nurse before applying any prescription medications to the skin.      CHG Compatible Lotions   Aveeno Moisturizing lotion  Cetaphil Moisturizing Cream  Cetaphil Moisturizing Lotion  Clairol Herbal Essence Moisturizing Lotion, Dry Skin  Clairol Herbal Essence Moisturizing Lotion, Extra Dry Skin  Clairol Herbal Essence Moisturizing Lotion, Normal Skin  Curel Age Defying Therapeutic Moisturizing Lotion with Alpha Hydroxy  Curel Extreme Care Body Lotion  Curel Soothing Hands Moisturizing Hand Lotion  Curel Therapeutic Moisturizing Cream, Fragrance-Free  Curel Therapeutic Moisturizing Lotion, Fragrance-Free  Curel Therapeutic Moisturizing  Lotion, Original Formula  Eucerin Daily Replenishing Lotion  Eucerin Dry Skin Therapy Plus Alpha Hydroxy Crme  Eucerin Dry Skin Therapy Plus Alpha Hydroxy Lotion  Eucerin Original Crme  Eucerin Original Lotion  Eucerin Plus Crme Eucerin Plus Lotion  Eucerin TriLipid Replenishing Lotion  Keri Anti-Bacterial Hand Lotion  Keri Deep Conditioning Original Lotion Dry Skin Formula Softly Scented  Keri Deep Conditioning Original Lotion, Fragrance Free Sensitive Skin Formula  Keri Lotion Fast Absorbing Fragrance Free Sensitive Skin Formula  Keri Lotion Fast Absorbing Softly Scented Dry Skin Formula  Keri Original Lotion  Keri Skin Renewal Lotion Keri Silky Smooth Lotion  Keri Silky Smooth Sensitive Skin Lotion  Nivea Body Creamy Conditioning Oil  Nivea Body Extra Enriched Lotion  Nivea Body Original Lotion  Nivea Body Sheer Moisturizing Lotion Nivea Crme  Nivea Skin Firming Lotion  NutraDerm 30 Skin Lotion  NutraDerm Skin Lotion  NutraDerm Therapeutic Skin Cream  NutraDerm  Therapeutic Skin Lotion  ProShield Protective Hand Cream  Provon moisturizing lotion    If you received a COVID test during your pre-op visit, it is requested that you wear a mask when out in public, stay away from anyone that may not be feeling well, and notify your surgeon if you develop symptoms. If you have been in contact with anyone that has tested positive in the last 10 days, please notify your surgeon.    Please read over the following fact sheets that you were given.

## 2023-02-13 ENCOUNTER — Other Ambulatory Visit: Payer: Self-pay

## 2023-02-13 ENCOUNTER — Encounter (HOSPITAL_COMMUNITY)
Admission: RE | Admit: 2023-02-13 | Discharge: 2023-02-13 | Disposition: A | Discharge status: Home / Self Care | Payer: Medicare PPO | Source: Ambulatory Visit | Attending: Orthopaedic Surgery | Admitting: Orthopaedic Surgery

## 2023-02-13 ENCOUNTER — Encounter (HOSPITAL_COMMUNITY): Payer: Self-pay

## 2023-02-13 VITALS — BP 122/78 | HR 92 | Temp 98.2°F | Resp 18 | Ht 68.0 in | Wt 205.0 lb

## 2023-02-13 DIAGNOSIS — Z01812 Encounter for preprocedural laboratory examination: Secondary | ICD-10-CM | POA: Insufficient documentation

## 2023-02-13 DIAGNOSIS — Z87891 Personal history of nicotine dependence: Secondary | ICD-10-CM | POA: Diagnosis not present

## 2023-02-13 DIAGNOSIS — M1711 Unilateral primary osteoarthritis, right knee: Secondary | ICD-10-CM | POA: Diagnosis not present

## 2023-02-13 DIAGNOSIS — R0982 Postnasal drip: Secondary | ICD-10-CM | POA: Insufficient documentation

## 2023-02-13 DIAGNOSIS — R053 Chronic cough: Secondary | ICD-10-CM | POA: Insufficient documentation

## 2023-02-13 DIAGNOSIS — J45909 Unspecified asthma, uncomplicated: Secondary | ICD-10-CM | POA: Diagnosis not present

## 2023-02-13 DIAGNOSIS — K219 Gastro-esophageal reflux disease without esophagitis: Secondary | ICD-10-CM | POA: Insufficient documentation

## 2023-02-13 DIAGNOSIS — Z01818 Encounter for other preprocedural examination: Secondary | ICD-10-CM

## 2023-02-13 LAB — BASIC METABOLIC PANEL
Anion gap: 12 (ref 5–15)
BUN: 16 mg/dL (ref 8–23)
CO2: 31 mmol/L (ref 22–32)
Calcium: 8.9 mg/dL (ref 8.9–10.3)
Chloride: 94 mmol/L — ABNORMAL LOW (ref 98–111)
Creatinine, Ser: 1.13 mg/dL (ref 0.61–1.24)
GFR, Estimated: >60 mL/min (ref >60)
Glucose, Bld: 120 mg/dL — ABNORMAL HIGH (ref 70–99)
Potassium: 3.3 mmol/L — ABNORMAL LOW (ref 3.5–5.1)
Sodium: 137 mmol/L (ref 135–145)

## 2023-02-13 LAB — CBC
HCT: 43.2 % (ref 39.0–52.0)
Hemoglobin: 14.1 g/dL (ref 13.0–17.0)
MCH: 28.4 pg (ref 26.0–34.0)
MCHC: 32.6 g/dL (ref 30.0–36.0)
MCV: 86.9 fL (ref 80.0–100.0)
Platelets: 238 10*3/uL (ref 150–400)
RBC: 4.97 MIL/uL (ref 4.22–5.81)
RDW: 13.1 % (ref 11.5–15.5)
WBC: 3.8 10*3/uL — ABNORMAL LOW (ref 4.0–10.5)
nRBC: 0 % (ref 0.0–0.2)

## 2023-02-13 LAB — SURGICAL PCR SCREEN
MRSA, PCR: NEGATIVE
Staphylococcus aureus: NEGATIVE

## 2023-02-13 NOTE — Anesthesia Preprocedure Evaluation (Addendum)
Anesthesia Evaluation  Patient identified by MRN, date of birth, ID band Patient awake    Reviewed: Allergy & Precautions, NPO status , Patient's Chart, lab work & pertinent test results, reviewed documented beta blocker date and time   History of Anesthesia Complications Negative for: history of anesthetic complications  Airway Mallampati: II  TM Distance: >3 FB Neck ROM: Full    Dental  (+) Dental Advisory Given, Missing   Pulmonary asthma , sleep apnea (does not need CPAP) , COPD,  COPD inhaler, former smoker   breath sounds clear to auscultation       Cardiovascular hypertension, Pt. on medications and Pt. on home beta blockers (-) angina  Rhythm:Regular Rate:Normal  '22 ECHO: EF 55-60%.  1. The LV has normal function, no regional wall motion abnormalities. Left ventricular diastolic parameters were normal.   2. RVF is normal. The right ventricular size is normal.   3. The mitral valve is grossly normal. No evidence of MR   4. The aortic valve is normal in structure. AI is not visualized.     Neuro/Psych  Headaches    GI/Hepatic Neg liver ROS,GERD  Medicated and Controlled,,  Endo/Other  BMI 32  Renal/GU Renal InsufficiencyRenal disease     Musculoskeletal  (+) Arthritis ,    Abdominal   Peds  Hematology negative hematology ROS (+) Hb 14.1, plt 238k   Anesthesia Other Findings   Reproductive/Obstetrics                              Anesthesia Physical Anesthesia Plan  ASA: 3  Anesthesia Plan: Spinal   Post-op Pain Management: Regional block* and Tylenol PO (pre-op)*   Induction: Intravenous  PONV Risk Score and Plan: 1 and Treatment may vary due to age or medical condition  Airway Management Planned: Simple Face Mask and Natural Airway  Additional Equipment: None  Intra-op Plan:   Post-operative Plan:   Informed Consent: I have reviewed the patients History and  Physical, chart, labs and discussed the procedure including the risks, benefits and alternatives for the proposed anesthesia with the patient or authorized representative who has indicated his/her understanding and acceptance.     Dental advisory given  Plan Discussed with: CRNA and Surgeon  Anesthesia Plan Comments: (Plan routine monitors, SAB with adductor canal block for post op analgesia  PAT note by Antionette Poles, PA-C: Pt follows with pulmonology for hx of chronic cough in setting of allergic asthma with exercise induced bronchoconstriction, upper airway cough syndrome, mild OSA on on CPAP, Post nasal drip, GERD, vocal cord disorder. Former smoker, quit 1990. 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 Last seen by Dr. Craige Cotta 12/13/22. Discussed that he continued to have chronic mildly productive cough that has been present for several months. CXR at that time was normal. He was continued on Wixela, Singulair, and prn Albuterol. Recommended consider biologic if no improvement.   Pt states he was recently started on benzonatate by his PCP with some improvement in cough. States it is now mild. Preop RN states pt did not cough at all during evaluation.   Hx of GERD, maintained on omeprazole.   Preop labs reviewed, mild hypokalemia with potassium 3.3, WBC mildly low at 3.8, otherwise unremarkable.  EKG 02/13/23: NSR. Rate 81.   TTE 12/14/20:  1. Left ventricular ejection fraction, by estimation, is 55 to 60%. The  left ventricle has normal function. The left  ventricle has no regional  wall motion abnormalities. Left ventricular diastolic parameters were  normal.   2. Right ventricular systolic function is normal. The right ventricular  size is normal.   3. The mitral valve is grossly normal. No evidence of mitral valve  regurgitation.   4. The aortic valve is normal in structure. Aortic valve regurgitation is  not visualized.    )         Anesthesia  Quick Evaluation

## 2023-02-13 NOTE — Progress Notes (Signed)
Anesthesia Chart Review:  Pt follows with pulmonology for hx of chronic cough in setting of allergic asthma with exercise induced bronchoconstriction, upper airway cough syndrome, mild OSA on on CPAP, Post nasal drip, GERD, vocal cord disorder. Former smoker, quit 1990. 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 Last seen by Dr. Craige Cotta 12/13/22. Discussed that he continued to have chronic mildly productive cough that has been present for several months. CXR at that time was normal. He was continued on Wixela, Singulair, and prn Albuterol. Recommended consider biologic if no improvement.   Pt states he was recently started on benzonatate by his PCP with some improvement in cough. States it is now mild. Preop RN states pt did not cough at all during evaluation.   Hx of GERD, maintained on omeprazole.   Preop labs reviewed, mild hypokalemia with potassium 3.3, WBC mildly low at 3.8, otherwise unremarkable.  EKG 02/13/23: NSR. Rate 81.   TTE 12/14/20:  1. Left ventricular ejection fraction, by estimation, is 55 to 60%. The  left ventricle has normal function. The left ventricle has no regional  wall motion abnormalities. Left ventricular diastolic parameters were  normal.   2. Right ventricular systolic function is normal. The right ventricular  size is normal.   3. The mitral valve is grossly normal. No evidence of mitral valve  regurgitation.   4. The aortic valve is normal in structure. Aortic valve regurgitation is  not visualized.     [facility] C[facility] Short Stay Center/Anesthesiology Phone [phone] 02/13/2023 2:01 PM

## 2023-02-13 NOTE — Progress Notes (Addendum)
PCP - Corliss Blacker, MD Cardiologist - pt states he saw a cardiologist at some point who switched his Atenolol to carvedilol "years ago", but does not have a current cardiologist of cardiac issues.  Pulmonologist- Dr. Coralyn Helling  PPM/ICD - denies  Chest x-ray - 12/17/22 EKG - 02/13/2023  Stress Test - denies ECHO - 11/2020 Cardiac Cath - denies  Sleep Study - pt reports mild sleep apnea with no need for CPAP   Fasting Blood Sugar - N/A   Last dose of GLP1 agonist-  N/A  Blood Thinner Instructions: N/A Aspirin Instructions: N/A  ERAS Protcol - ERAS + ensure per order   COVID TEST- N/A   Anesthesia review:  Pt states he has had a cough for about a month. Pt was started on Benzonatate by PCP. Pt states this cough feels like a tickle in his throat. Pt reports the cough has improved and mild since taking the cough medication. Pt states that his PCP mentioned it may be due to his asthma. Pt denies any other symptoms. I spoke with Antionette Poles, PA regarding this at pt's PAT appointment. Last office note requested from PCP.   No cough noted during PAT appointment.   Patient denies shortness of breath, fever, and chest pain at PAT appointment   All instructions explained to the patient, with a verbal understanding of the material. Patient agrees to go over the instructions while at home for a better understanding. The opportunity to ask questions was provided.

## 2023-02-14 NOTE — Progress Notes (Signed)
Surgical Instructions    Your procedure is scheduled on Monday August 26th.  Report to [facility] Main Entrance "A" at 7 A.M., then check in with the Admitting office.  Call this number if you have problems the morning of surgery:  [phone]   If you have any questions prior to your surgery date call [phone]: Open Monday-Friday 8am-4pm If you experience any cold or flu symptoms such as cough, fever, chills, shortness of breath, etc. between now and your scheduled surgery, please notify us at the above number     Remember:  Do not eat after midnight the night before your surgery  You may drink clear liquids until 0630 the morning of your surgery.   Clear liquids allowed are: Water, Non-Citrus Juices (without pulp), Carbonated Beverages, Clear Tea, Black Coffee ONLY (NO MILK, CREAM OR POWDERED CREAMER of any kind), and Gatorade   Enhanced Recovery after Surgery for Orthopedics Enhanced Recovery after Surgery is a protocol used to improve the stress on your body and your recovery after surgery.  Patient Instructions  The day of surgery (if you do NOT have diabetes):  Drink ONE (1) Pre-Surgery Clear Ensure by __6:30___ am the morning of surgery   This drink was given to you during your hospital  pre-op appointment visit. Nothing else to drink after completing the  Pre-Surgery Clear Ensure.          If you have questions, please contact your surgeon's office.    Take these medicines the morning of surgery with A SIP OF WATER: carvedilol (COREG) 12.5 MG tablet  fluticasone-salmeterol (WIXELA INHUB) 250-50 MCG/ACT AEPB - please bring with you to the hospital gabapentin (NEURONTIN) 300 MG capsule   omeprazole (PRILOSEC) 40 MG capsule    IF NEEDED  albuterol (PROAIR HFA) 108 (90 Base) MCG/ACT inhaler - please bring with you to the hospital albuterol (PROVENTIL) (2.5 MG/3ML) 0.083% nebulizer solution     Follow your surgeon's instructions on when to stop Aspirin.  If no  instructions were given by your surgeon then you will need to call the office to get those instructions.     As of today, STOP taking any Aspirin (unless otherwise instructed by your surgeon) Aleve, Naproxen, Ibuprofen, Motrin, Advil, Goody's, BC's, all herbal medications, fish oil, and all vitamins.           Do not wear jewelry  Do not wear lotions, powders, cologne or deodorant. Do not shave 48 hours prior to surgery.  Men may shave face and neck. Do not bring valuables to the hospital. Do not wear nail polish  [facility] is not responsible for any belongings or valuables.    Do NOT Smoke (Tobacco/Vaping)  24 hours prior to your procedure  If you use a CPAP at night, you may bring your mask for your overnight stay.   Contacts, glasses, hearing aids, dentures or partials may not be worn into surgery, please bring cases for these belongings   For patients admitted to the hospital, discharge time will be determined by your treatment team.   Patients discharged the day of surgery will not be allowed to drive home, and someone needs to stay with them for 24 hours.   SURGICAL WAITING ROOM VISITATION Patients having surgery or a procedure may have no more than 2 support people in the waiting area - these visitors may rotate.   Children under the age of 41 must have an adult with them who is not the patient. If the patient needs  to stay at the hospital during part of their recovery, the visitor guidelines for inpatient rooms apply. Pre-op nurse will coordinate an appropriate time for 1 support person to accompany patient in pre-op.  This support person may not rotate.   Please refer to [email or web address] for the visitor guidelines for Inpatients (after your surgery is over and you are in a regular room).    Special instructions:    Oral Hygiene is also important to reduce your risk of infection.  Remember - BRUSH YOUR TEETH THE  MORNING OF SURGERY WITH YOUR REGULAR TOOTHPASTE     Pre-operative 5 CHG Bath Instructions   You can play a key role in reducing the risk of infection after surgery. Your skin needs to be as free of germs as possible. You can reduce the number of germs on your skin by washing with CHG (chlorhexidine gluconate) soap before surgery. CHG is an antiseptic soap that kills germs and continues to kill germs even after washing.   DO NOT use if you have an allergy to chlorhexidine/CHG or antibacterial soaps. If your skin becomes reddened or irritated, stop using the CHG and notify one of our RNs at [phone].   Please shower with the CHG soap starting 4 days before surgery using the following schedule:     Please keep in mind the following:  DO NOT shave, including legs and underarms, starting the day of your first shower.   You may shave your face at any point before/day of surgery.  Place clean sheets on your bed the day you start using CHG soap. Use a clean washcloth (not used since being washed) for each shower. DO NOT sleep with pets once you start using the CHG.   CHG Shower Instructions:  If you choose to wash your hair and private area, wash first with your normal shampoo/soap.  After you use shampoo/soap, rinse your hair and body thoroughly to remove shampoo/soap residue.  Turn the water OFF and apply about 3 tablespoons (45 ml) of CHG soap to a CLEAN washcloth.  Apply CHG soap ONLY FROM YOUR NECK DOWN TO YOUR TOES (washing for 3-5 minutes)  DO NOT use CHG soap on face, private areas, open wounds, or sores.  Pay special attention to the area where your surgery is being performed.  If you are having back surgery, having someone wash your back for you may be helpful. Wait 2 minutes after CHG soap is applied, then you may rinse off the CHG soap.  Pat dry with a clean towel  Put on clean clothes/pajamas   If you choose to wear lotion, please use ONLY the CHG-compatible lotions on the  back of this paper.     Additional instructions for the day of surgery: DO NOT APPLY any lotions, deodorants, cologne, or perfumes.   Put on clean/comfortable clothes.  Brush your teeth.  Ask your nurse before applying any prescription medications to the skin.      CHG Compatible Lotions   Aveeno Moisturizing lotion  Cetaphil Moisturizing Cream  Cetaphil Moisturizing Lotion  Clairol Herbal Essence Moisturizing Lotion, Dry Skin  Clairol Herbal Essence Moisturizing Lotion, Extra Dry Skin  Clairol Herbal Essence Moisturizing Lotion, Normal Skin  Curel Age Defying Therapeutic Moisturizing Lotion with Alpha Hydroxy  Curel Extreme Care Body Lotion  Curel Soothing Hands Moisturizing Hand Lotion  Curel Therapeutic Moisturizing Cream, Fragrance-Free  Curel Therapeutic Moisturizing Lotion, Fragrance-Free  Curel Therapeutic Moisturizing Lotion, Original Formula  Eucerin Daily Replenishing Lotion  Eucerin Dry Skin Therapy  Plus Alpha Hydroxy Crme  Eucerin Dry Skin Therapy Plus Alpha Hydroxy Lotion  Eucerin Original Crme  Eucerin Original Lotion  Eucerin Plus Crme Eucerin Plus Lotion  Eucerin TriLipid Replenishing Lotion  Keri Anti-Bacterial Hand Lotion  Keri Deep Conditioning Original Lotion Dry Skin Formula Softly Scented  Keri Deep Conditioning Original Lotion, Fragrance Free Sensitive Skin Formula  Keri Lotion Fast Absorbing Fragrance Free Sensitive Skin Formula  Keri Lotion Fast Absorbing Softly Scented Dry Skin Formula  Keri Original Lotion  Keri Skin Renewal Lotion Keri Silky Smooth Lotion  Keri Silky Smooth Sensitive Skin Lotion  Nivea Body Creamy Conditioning Oil  Nivea Body Extra Enriched Lotion  Nivea Body Original Lotion  Nivea Body Sheer Moisturizing Lotion Nivea Crme  Nivea Skin Firming Lotion  NutraDerm 30 Skin Lotion  NutraDerm Skin Lotion  NutraDerm Therapeutic Skin Cream  NutraDerm Therapeutic Skin Lotion  ProShield Protective Hand Cream  Provon moisturizing  lotion    If you received a COVID test during your pre-op visit, it is requested that you wear a mask when out in public, stay away from anyone that may not be feeling well, and notify your surgeon if you develop symptoms. If you have been in contact with anyone that has tested positive in the last 10 days, please notify your surgeon.    Please read over the following fact sheets that you were given.

## 2023-02-17 MED ORDER — TRANEXAMIC ACID 1000 MG/10ML IV SOLN
2000.0000 mg | INTRAVENOUS | Status: AC
  Filled 2023-02-17: qty 20

## 2023-02-20 ENCOUNTER — Ambulatory Visit (HOSPITAL_COMMUNITY): Payer: Medicare PPO | Admitting: Physician Assistant

## 2023-02-20 ENCOUNTER — Other Ambulatory Visit: Payer: Self-pay

## 2023-02-20 ENCOUNTER — Encounter (HOSPITAL_COMMUNITY)
Admission: RE | Disposition: A | Discharge status: Home / Self Care | Payer: Self-pay | Source: Home / Self Care | Attending: Orthopaedic Surgery

## 2023-02-20 ENCOUNTER — Encounter (HOSPITAL_COMMUNITY): Payer: Self-pay | Admitting: Orthopaedic Surgery

## 2023-02-20 ENCOUNTER — Observation Stay (HOSPITAL_COMMUNITY): Payer: Medicare PPO

## 2023-02-20 ENCOUNTER — Observation Stay (HOSPITAL_COMMUNITY)
Admission: RE | Admit: 2023-02-20 | Discharge: 2023-02-21 | Disposition: A | Discharge status: Home / Self Care | Payer: Medicare PPO | Attending: Orthopaedic Surgery | Admitting: Orthopaedic Surgery

## 2023-02-20 ENCOUNTER — Other Ambulatory Visit: Payer: Self-pay | Admitting: Physician Assistant

## 2023-02-20 DIAGNOSIS — Z79899 Other long term (current) drug therapy: Secondary | ICD-10-CM | POA: Insufficient documentation

## 2023-02-20 DIAGNOSIS — Z87891 Personal history of nicotine dependence: Secondary | ICD-10-CM | POA: Diagnosis not present

## 2023-02-20 DIAGNOSIS — J449 Chronic obstructive pulmonary disease, unspecified: Secondary | ICD-10-CM | POA: Diagnosis not present

## 2023-02-20 DIAGNOSIS — Z7982 Long term (current) use of aspirin: Secondary | ICD-10-CM | POA: Diagnosis not present

## 2023-02-20 DIAGNOSIS — I10 Essential (primary) hypertension: Secondary | ICD-10-CM | POA: Insufficient documentation

## 2023-02-20 DIAGNOSIS — J45909 Unspecified asthma, uncomplicated: Secondary | ICD-10-CM | POA: Diagnosis not present

## 2023-02-20 DIAGNOSIS — M1711 Unilateral primary osteoarthritis, right knee: Secondary | ICD-10-CM

## 2023-02-20 DIAGNOSIS — Z96651 Presence of right artificial knee joint: Secondary | ICD-10-CM

## 2023-02-20 DIAGNOSIS — Z96652 Presence of left artificial knee joint: Secondary | ICD-10-CM | POA: Diagnosis not present

## 2023-02-20 DIAGNOSIS — R972 Elevated prostate specific antigen [PSA]: Secondary | ICD-10-CM

## 2023-02-20 HISTORY — PX: TOTAL KNEE ARTHROPLASTY: SHX125

## 2023-02-20 SURGERY — ARTHROPLASTY, KNEE, TOTAL
Anesthesia: Spinal | Site: Knee | Laterality: Right

## 2023-02-20 MED ORDER — 0.9 % SODIUM CHLORIDE (POUR BTL) OPTIME
TOPICAL | Status: DC | PRN
  Administered 2023-02-20: 1000 mL

## 2023-02-20 MED ORDER — FENTANYL CITRATE (PF) 250 MCG/5ML IJ SOLN
INTRAMUSCULAR | Status: DC | PRN
Start: 2023-02-20 — End: 2023-02-20
  Administered 2023-02-20: 50 ug via INTRAVENOUS

## 2023-02-20 MED ORDER — BUPIVACAINE IN DEXTROSE 0.75-8.25 % IT SOLN
INTRATHECAL | Status: DC | PRN
  Administered 2023-02-20: 15 mg via INTRATHECAL

## 2023-02-20 MED ORDER — ONDANSETRON HCL 4 MG/2ML IJ SOLN: 4.0000 mg | Freq: Four times a day (QID) | INTRAMUSCULAR | Status: DC | PRN

## 2023-02-20 MED ORDER — HYDROMORPHONE HCL 1 MG/ML IJ SOLN: 0.2500 mg | INTRAMUSCULAR | Status: DC | PRN

## 2023-02-20 MED ORDER — TRANEXAMIC ACID-NACL 1000-0.7 MG/100ML-% IV SOLN
1000.0000 mg | Freq: Once | INTRAVENOUS | Status: AC
  Administered 2023-02-20: 1000 mg via INTRAVENOUS
  Filled 2023-02-20: qty 100

## 2023-02-20 MED ORDER — CEFAZOLIN SODIUM-DEXTROSE 2-4 GM/100ML-% IV SOLN
2.0000 g | Freq: Four times a day (QID) | INTRAVENOUS | Status: AC
  Administered 2023-02-20 (×2): 2 g via INTRAVENOUS
  Filled 2023-02-20 (×2): qty 100

## 2023-02-20 MED ORDER — BUPIVACAINE-MELOXICAM ER 400-12 MG/14ML IJ SOLN
INTRAMUSCULAR | Status: AC
  Filled 2023-02-20: qty 1

## 2023-02-20 MED ORDER — LACTATED RINGERS IV SOLN: INTRAVENOUS | Status: DC

## 2023-02-20 MED ORDER — OXYCODONE HCL ER 10 MG PO T12A
10.0000 mg | EXTENDED_RELEASE_TABLET | Freq: Two times a day (BID) | ORAL | Status: DC
  Administered 2023-02-20 (×2): 10 mg via ORAL
  Filled 2023-02-20 (×2): qty 1

## 2023-02-20 MED ORDER — MEPERIDINE HCL 25 MG/ML IJ SOLN: 6.2500 mg | INTRAMUSCULAR | Status: DC | PRN

## 2023-02-20 MED ORDER — DOCUSATE SODIUM 100 MG PO CAPS: 100.0000 mg | ORAL_CAPSULE | Freq: Every day | ORAL | 2 refills | Status: AC | PRN

## 2023-02-20 MED ORDER — ONDANSETRON HCL 4 MG PO TABS: 4.0000 mg | ORAL_TABLET | Freq: Three times a day (TID) | ORAL | 0 refills | Status: AC | PRN

## 2023-02-20 MED ORDER — PHENYLEPHRINE HCL-NACL 20-0.9 MG/250ML-% IV SOLN
INTRAVENOUS | Status: DC | PRN
  Administered 2023-02-20: 15 ug/min via INTRAVENOUS

## 2023-02-20 MED ORDER — OXYCODONE HCL 5 MG PO TABS: 5.0000 mg | ORAL_TABLET | Freq: Once | ORAL | Status: DC | PRN

## 2023-02-20 MED ORDER — DEXAMETHASONE SODIUM PHOSPHATE 10 MG/ML IJ SOLN
10.0000 mg | Freq: Once | INTRAMUSCULAR | Status: AC
  Administered 2023-02-21: 10 mg via INTRAVENOUS
  Filled 2023-02-20: qty 1

## 2023-02-20 MED ORDER — ASPIRIN 81 MG PO TBEC: 81.0000 mg | DELAYED_RELEASE_TABLET | Freq: Two times a day (BID) | ORAL | 0 refills | Status: AC

## 2023-02-20 MED ORDER — ONDANSETRON HCL 4 MG/2ML IJ SOLN
INTRAMUSCULAR | Status: DC | PRN
  Administered 2023-02-20: 4 mg via INTRAVENOUS

## 2023-02-20 MED ORDER — INDAPAMIDE 1.25 MG PO TABS
2.5000 mg | ORAL_TABLET | Freq: Every day | ORAL | Status: DC
  Filled 2023-02-20: qty 2
  Filled 2023-02-20: qty 1

## 2023-02-20 MED ORDER — TRANEXAMIC ACID 1000 MG/10ML IV SOLN
INTRAVENOUS | Status: DC | PRN
  Administered 2023-02-20: 2000 mg via TOPICAL

## 2023-02-20 MED ORDER — FENTANYL CITRATE (PF) 100 MCG/2ML IJ SOLN: 100.0000 ug | Freq: Once | INTRAMUSCULAR | Status: AC

## 2023-02-20 MED ORDER — ONDANSETRON HCL 4 MG PO TABS: 4.0000 mg | ORAL_TABLET | Freq: Four times a day (QID) | ORAL | Status: DC | PRN

## 2023-02-20 MED ORDER — PROPOFOL 500 MG/50ML IV EMUL
INTRAVENOUS | Status: DC | PRN
  Administered 2023-02-20: 50 ug/kg/min via INTRAVENOUS

## 2023-02-20 MED ORDER — ACETAMINOPHEN 500 MG PO TABS
1000.0000 mg | ORAL_TABLET | Freq: Four times a day (QID) | ORAL | Status: AC
  Administered 2023-02-20 – 2023-02-21 (×4): 1000 mg via ORAL
  Filled 2023-02-20 (×4): qty 2

## 2023-02-20 MED ORDER — METOCLOPRAMIDE HCL 5 MG/ML IJ SOLN: 5.0000 mg | Freq: Three times a day (TID) | INTRAMUSCULAR | Status: DC | PRN

## 2023-02-20 MED ORDER — CARVEDILOL 6.25 MG PO TABS
6.2500 mg | ORAL_TABLET | Freq: Two times a day (BID) | ORAL | Status: DC
  Filled 2023-02-20: qty 1

## 2023-02-20 MED ORDER — GABAPENTIN 300 MG PO CAPS
300.0000 mg | ORAL_CAPSULE | Freq: Three times a day (TID) | ORAL | Status: DC
  Administered 2023-02-20 (×2): 300 mg via ORAL
  Filled 2023-02-20 (×2): qty 1

## 2023-02-20 MED ORDER — TRANEXAMIC ACID-NACL 1000-0.7 MG/100ML-% IV SOLN
1000.0000 mg | INTRAVENOUS | Status: AC
  Administered 2023-02-20: 1000 mg via INTRAVENOUS
  Filled 2023-02-20: qty 100

## 2023-02-20 MED ORDER — KETOROLAC TROMETHAMINE 15 MG/ML IJ SOLN
7.5000 mg | Freq: Four times a day (QID) | INTRAMUSCULAR | Status: AC
  Administered 2023-02-20 – 2023-02-21 (×4): 7.5 mg via INTRAVENOUS
  Filled 2023-02-20 (×4): qty 1

## 2023-02-20 MED ORDER — VANCOMYCIN HCL 1000 MG IV SOLR
INTRAVENOUS | Status: DC | PRN
  Administered 2023-02-20: 1000 mg

## 2023-02-20 MED ORDER — METOCLOPRAMIDE HCL 5 MG PO TABS: 5.0000 mg | ORAL_TABLET | Freq: Three times a day (TID) | ORAL | Status: DC | PRN

## 2023-02-20 MED ORDER — OXYCODONE HCL 5 MG PO TABS: 10.0000 mg | ORAL_TABLET | ORAL | Status: DC | PRN

## 2023-02-20 MED ORDER — SODIUM CHLORIDE 0.9 % IR SOLN
Status: DC | PRN
  Administered 2023-02-20: 1000 mL

## 2023-02-20 MED ORDER — PROPOFOL 10 MG/ML IV BOLUS
INTRAVENOUS | Status: DC | PRN
Start: 2023-02-20 — End: 2023-02-20
  Administered 2023-02-20: 30 mg via INTRAVENOUS
  Administered 2023-02-20: 50 mg via INTRAVENOUS
  Administered 2023-02-20 (×3): 40 mg via INTRAVENOUS

## 2023-02-20 MED ORDER — OXYCODONE HCL 5 MG/5ML PO SOLN: 5.0000 mg | Freq: Once | ORAL | Status: DC | PRN

## 2023-02-20 MED ORDER — PROPOFOL 10 MG/ML IV BOLUS
INTRAVENOUS | Status: AC
  Filled 2023-02-20: qty 20

## 2023-02-20 MED ORDER — ASPIRIN 81 MG PO CHEW
81.0000 mg | CHEWABLE_TABLET | Freq: Two times a day (BID) | ORAL | Status: DC
  Administered 2023-02-20: 81 mg via ORAL
  Filled 2023-02-20: qty 1

## 2023-02-20 MED ORDER — DOCUSATE SODIUM 100 MG PO CAPS
100.0000 mg | ORAL_CAPSULE | Freq: Two times a day (BID) | ORAL | Status: DC
  Administered 2023-02-20 (×2): 100 mg via ORAL
  Filled 2023-02-20 (×2): qty 1

## 2023-02-20 MED ORDER — ONDANSETRON HCL 4 MG/2ML IJ SOLN
INTRAMUSCULAR | Status: AC
  Filled 2023-02-20: qty 2

## 2023-02-20 MED ORDER — POVIDONE-IODINE 10 % EX SWAB
2.0000 | Freq: Once | CUTANEOUS | Status: AC
  Administered 2023-02-20: 2 via TOPICAL

## 2023-02-20 MED ORDER — HYDROMORPHONE HCL 1 MG/ML IJ SOLN: 0.5000 mg | INTRAMUSCULAR | Status: DC | PRN

## 2023-02-20 MED ORDER — DEXAMETHASONE SODIUM PHOSPHATE 10 MG/ML IJ SOLN
INTRAMUSCULAR | Status: DC | PRN
  Administered 2023-02-20: 10 mg via INTRAVENOUS

## 2023-02-20 MED ORDER — FENTANYL CITRATE (PF) 250 MCG/5ML IJ SOLN
INTRAMUSCULAR | Status: AC
  Filled 2023-02-20: qty 5

## 2023-02-20 MED ORDER — OXYCODONE-ACETAMINOPHEN 5-325 MG PO TABS
1.0000 | ORAL_TABLET | Freq: Four times a day (QID) | ORAL | 0 refills | Status: DC | PRN
Start: 2023-02-20 — End: 2023-02-26

## 2023-02-20 MED ORDER — PHENOL 1.4 % MT LIQD: 1.0000 | OROMUCOSAL | Status: DC | PRN

## 2023-02-20 MED ORDER — CEFAZOLIN SODIUM-DEXTROSE 2-4 GM/100ML-% IV SOLN
2.0000 g | INTRAVENOUS | Status: AC
  Administered 2023-02-20: 2 g via INTRAVENOUS
  Filled 2023-02-20: qty 100

## 2023-02-20 MED ORDER — ACETAMINOPHEN 325 MG PO TABS: 325.0000 mg | ORAL_TABLET | Freq: Four times a day (QID) | ORAL | Status: DC | PRN

## 2023-02-20 MED ORDER — METHOCARBAMOL 750 MG PO TABS: 750.0000 mg | ORAL_TABLET | Freq: Two times a day (BID) | ORAL | 2 refills | Status: DC | PRN

## 2023-02-20 MED ORDER — SODIUM CHLORIDE 0.9 % IV SOLN: INTRAVENOUS | Status: DC

## 2023-02-20 MED ORDER — CHLORHEXIDINE GLUCONATE 0.12 % MT SOLN
15.0000 mL | Freq: Once | OROMUCOSAL | Status: AC
  Administered 2023-02-20: 15 mL via OROMUCOSAL
  Filled 2023-02-20: qty 15

## 2023-02-20 MED ORDER — METHOCARBAMOL 1000 MG/10ML IJ SOLN: 500.0000 mg | Freq: Four times a day (QID) | INTRAVENOUS | Status: DC | PRN

## 2023-02-20 MED ORDER — ACETAMINOPHEN 500 MG PO TABS
1000.0000 mg | ORAL_TABLET | Freq: Once | ORAL | Status: AC
  Administered 2023-02-20: 1000 mg via ORAL
  Filled 2023-02-20: qty 2

## 2023-02-20 MED ORDER — ROPIVACAINE HCL 7.5 MG/ML IJ SOLN
INTRAMUSCULAR | Status: DC | PRN
Start: 2023-02-20 — End: 2023-02-20
  Administered 2023-02-20: 20 mL via PERINEURAL

## 2023-02-20 MED ORDER — MIDAZOLAM HCL 2 MG/2ML IJ SOLN: 0.5000 mg | Freq: Once | INTRAMUSCULAR | Status: DC | PRN

## 2023-02-20 MED ORDER — MENTHOL 3 MG MT LOZG: 1.0000 | LOZENGE | OROMUCOSAL | Status: DC | PRN

## 2023-02-20 MED ORDER — OXYCODONE HCL 5 MG PO TABS
5.0000 mg | ORAL_TABLET | ORAL | Status: DC | PRN
  Filled 2023-02-20 (×2): qty 1

## 2023-02-20 MED ORDER — PRONTOSAN WOUND IRRIGATION OPTIME
TOPICAL | Status: DC | PRN
  Administered 2023-02-20: 700 mL

## 2023-02-20 MED ORDER — VANCOMYCIN HCL 1000 MG IV SOLR
INTRAVENOUS | Status: AC
  Filled 2023-02-20: qty 20

## 2023-02-20 MED ORDER — ORAL CARE MOUTH RINSE: 15.0000 mL | Freq: Once | OROMUCOSAL | Status: AC

## 2023-02-20 MED ORDER — MIDAZOLAM HCL 2 MG/2ML IJ SOLN
INTRAMUSCULAR | Status: AC
  Administered 2023-02-20: 2 mg via INTRAVENOUS
  Filled 2023-02-20: qty 2

## 2023-02-20 MED ORDER — FENTANYL CITRATE (PF) 100 MCG/2ML IJ SOLN
INTRAMUSCULAR | Status: AC
  Administered 2023-02-20: 100 ug via INTRAVENOUS
  Filled 2023-02-20: qty 2

## 2023-02-20 MED ORDER — DEXAMETHASONE SODIUM PHOSPHATE 10 MG/ML IJ SOLN
INTRAMUSCULAR | Status: AC
  Filled 2023-02-20: qty 1

## 2023-02-20 MED ORDER — PROMETHAZINE HCL 25 MG/ML IJ SOLN: 6.2500 mg | INTRAMUSCULAR | Status: DC | PRN

## 2023-02-20 MED ORDER — MIDAZOLAM HCL 2 MG/2ML IJ SOLN: 2.0000 mg | Freq: Once | INTRAMUSCULAR | Status: AC

## 2023-02-20 MED ORDER — BUPIVACAINE-MELOXICAM ER 400-12 MG/14ML IJ SOLN
INTRAMUSCULAR | Status: DC | PRN
  Administered 2023-02-20: 400 mg

## 2023-02-20 MED ORDER — PHENYLEPHRINE HCL-NACL 20-0.9 MG/250ML-% IV SOLN
INTRAVENOUS | Status: AC
  Filled 2023-02-20: qty 250

## 2023-02-20 MED ORDER — METHOCARBAMOL 500 MG PO TABS
500.0000 mg | ORAL_TABLET | Freq: Four times a day (QID) | ORAL | Status: DC | PRN
  Administered 2023-02-20 – 2023-02-21 (×2): 500 mg via ORAL
  Filled 2023-02-20 (×2): qty 1

## 2023-02-20 MED ORDER — FERROUS SULFATE 325 (65 FE) MG PO TABS
325.0000 mg | ORAL_TABLET | Freq: Three times a day (TID) | ORAL | Status: DC
  Administered 2023-02-20 (×2): 325 mg via ORAL
  Filled 2023-02-20 (×2): qty 1

## 2023-02-20 SURGICAL SUPPLY — 84 items
ADH SKN CLS APL DERMABOND .7 (GAUZE/BANDAGES/DRESSINGS) ×1
ALCOHOL 70% 16 OZ (MISCELLANEOUS) ×1 IMPLANT
BAG COUNTER SPONGE SURGICOUNT (BAG) IMPLANT
BAG DECANTER FOR FLEXI CONT (MISCELLANEOUS) ×1 IMPLANT
BAG SPNG CNTER NS LX DISP (BAG)
BANDAGE ESMARK 6X9 LF (GAUZE/BANDAGES/DRESSINGS) IMPLANT
BLADE SAG 18X100X1.27 (BLADE) ×1 IMPLANT
BLADE SAW SGTL 73X25 THK (BLADE) ×1 IMPLANT
BNDG CMPR 9X6 STRL LF SNTH (GAUZE/BANDAGES/DRESSINGS)
BNDG ESMARK 6X9 LF (GAUZE/BANDAGES/DRESSINGS)
BOWL SMART MIX CTS (DISPOSABLE) ×1 IMPLANT
BSPLAT TIB 5D F CMNT STM RT (Knees) ×1 IMPLANT
CEMENT BONE REFOBACIN R1X40 US (Cement) IMPLANT
CLSR STERI-STRIP ANTIMIC 1/2X4 (GAUZE/BANDAGES/DRESSINGS) ×2 IMPLANT
COMP FEM CMT PERSONA SZ9 RT (Joint) ×1 IMPLANT
COMPONENT FEM CMT PRSONA SZ9RT (Joint) IMPLANT
COOLER ICEMAN CLASSIC (MISCELLANEOUS) ×1 IMPLANT
COVER SURGICAL LIGHT HANDLE (MISCELLANEOUS) ×1 IMPLANT
CUFF TOURN SGL QUICK 34 (TOURNIQUET CUFF) ×1
CUFF TOURN SGL QUICK 42 (TOURNIQUET CUFF) IMPLANT
CUFF TRNQT CYL 34X4.125X (TOURNIQUET CUFF) ×1 IMPLANT
DERMABOND ADVANCED .7 DNX12 (GAUZE/BANDAGES/DRESSINGS) ×1 IMPLANT
DRAPE EXTREMITY T 121X128X90 (DISPOSABLE) ×1 IMPLANT
DRAPE HALF SHEET 40X57 (DRAPES) ×1 IMPLANT
DRAPE INCISE IOBAN 66X45 STRL (DRAPES) ×1 IMPLANT
DRAPE ORTHO SPLIT 77X108 STRL (DRAPES)
DRAPE POUCH INSTRU U-SHP 10X18 (DRAPES) ×1 IMPLANT
DRAPE SURG ORHT 6 SPLT 77X108 (DRAPES) IMPLANT
DRAPE U-SHAPE 47X51 STRL (DRAPES) ×2 IMPLANT
DRSG AQUACEL AG ADV 3.5X10 (GAUZE/BANDAGES/DRESSINGS) ×1 IMPLANT
DURAPREP 26ML APPLICATOR (WOUND CARE) ×3 IMPLANT
ELECT CAUTERY BLADE 6.4 (BLADE) ×1 IMPLANT
ELECT PENCIL ROCKER SW 15FT (MISCELLANEOUS) ×1 IMPLANT
ELECT REM PT RETURN 9FT ADLT (ELECTROSURGICAL) ×1
ELECTRODE REM PT RTRN 9FT ADLT (ELECTROSURGICAL) ×1 IMPLANT
GLOVE BIOGEL PI IND STRL 7.0 (GLOVE) ×2 IMPLANT
GLOVE BIOGEL PI IND STRL 7.5 (GLOVE) ×5 IMPLANT
GLOVE ECLIPSE 7.0 STRL STRAW (GLOVE) ×3 IMPLANT
GLOVE INDICATOR 7.0 STRL GRN (GLOVE) ×1 IMPLANT
GLOVE INDICATOR 7.5 STRL GRN (GLOVE) ×1 IMPLANT
GLOVE SURG SYN 7.5 E (GLOVE) ×2 IMPLANT
GLOVE SURG SYN 7.5 PF PI (GLOVE) ×2 IMPLANT
GLOVE SURG UNDER LTX SZ7.5 (GLOVE) ×2 IMPLANT
GLOVE SURG UNDER POLY LF SZ7 (GLOVE) ×2 IMPLANT
GOWN STRL REUS W/ TWL LRG LVL3 (GOWN DISPOSABLE) ×1 IMPLANT
GOWN STRL REUS W/TWL LRG LVL3 (GOWN DISPOSABLE) ×1
GOWN STRL SURGICAL XL XLNG (GOWN DISPOSABLE) ×1 IMPLANT
GOWN TOGA ZIPPER T7+ PEEL AWAY (MISCELLANEOUS) ×2 IMPLANT
HANDPIECE INTERPULSE COAX TIP (DISPOSABLE) ×1
HOOD PEEL AWAY T7 (MISCELLANEOUS) ×1 IMPLANT
INSERT TIBIA KNEE RIGHT 10 (Joint) IMPLANT
KIT BASIN OR (CUSTOM PROCEDURE TRAY) ×1 IMPLANT
KIT TURNOVER KIT B (KITS) ×1 IMPLANT
MANIFOLD NEPTUNE II (INSTRUMENTS) ×1 IMPLANT
MARKER SKIN DUAL TIP RULER LAB (MISCELLANEOUS) ×2 IMPLANT
NDL SPNL 18GX3.5 QUINCKE PK (NEEDLE) ×1 IMPLANT
NEEDLE SPNL 18GX3.5 QUINCKE PK (NEEDLE) ×1 IMPLANT
NS IRRIG 1000ML POUR BTL (IV SOLUTION) ×1 IMPLANT
PACK TOTAL JOINT (CUSTOM PROCEDURE TRAY) ×1 IMPLANT
PAD ARMBOARD 7.5X6 YLW CONV (MISCELLANEOUS) ×2 IMPLANT
PAD COLD SHLDR WRAP-ON (PAD) ×1 IMPLANT
PIN DRILL HDLS TROCAR 75 4PK (PIN) IMPLANT
SCREW FEMALE HEX FIX 25X2.5 (ORTHOPEDIC DISPOSABLE SUPPLIES) IMPLANT
SET HNDPC FAN SPRY TIP SCT (DISPOSABLE) ×1 IMPLANT
SOLUTION PRONTOSAN WOUND 350ML (IRRIGATION / IRRIGATOR) ×1 IMPLANT
STAPLER VISISTAT 35W (STAPLE) IMPLANT
STEM POLY PAT PLY 35M KNEE (Knees) IMPLANT
STEM TIBIA 5 DEG SZ F R KNEE (Knees) IMPLANT
SUCTION TUBE FRAZIER 10FR DISP (SUCTIONS) ×1 IMPLANT
SUT ETHILON 2 0 FS 18 (SUTURE) IMPLANT
SUT MNCRL AB 3-0 PS2 27 (SUTURE) IMPLANT
SUT VIC AB 0 CT1 27 (SUTURE) ×2
SUT VIC AB 0 CT1 27XBRD ANBCTR (SUTURE) ×2 IMPLANT
SUT VIC AB 1 CTX 27 (SUTURE) ×3 IMPLANT
SUT VIC AB 2-0 CT1 27 (SUTURE) ×4
SUT VIC AB 2-0 CT1 TAPERPNT 27 (SUTURE) ×4 IMPLANT
SYR 50ML LL SCALE MARK (SYRINGE) ×2 IMPLANT
TIBIA STEM 5 DEG SZ F R KNEE (Knees) ×1 IMPLANT
TOWEL GREEN STERILE (TOWEL DISPOSABLE) ×1 IMPLANT
TOWEL GREEN STERILE FF (TOWEL DISPOSABLE) ×1 IMPLANT
TRAY CATH INTERMITTENT SS 16FR (CATHETERS) IMPLANT
TUBE SUCT ARGYLE STRL (TUBING) ×1 IMPLANT
UNDERPAD 30X36 HEAVY ABSORB (UNDERPADS AND DIAPERS) ×1 IMPLANT
YANKAUER SUCT BULB TIP NO VENT (SUCTIONS) ×2 IMPLANT

## 2023-02-20 NOTE — Anesthesia Procedure Notes (Signed)
Anesthesia Regional Block: Adductor canal block   Pre-Anesthetic Checklist: , timeout performed,  Correct Patient, Correct Site, Correct Laterality,  Correct Procedure, Correct Position, site marked,  Risks and benefits discussed,  Surgical consent,  Pre-op evaluation,  At surgeon's request and post-op pain management  Laterality: Right and Lower  Prep: chloraprep       Needles:  Injection technique: Single-shot  Needle Type: Echogenic Needle     Needle Length: 9cm  Needle Gauge: 21     Additional Needles:   Procedures:,,,, ultrasound used (permanent image in chart),,    Narrative:  Start time: 02/20/2023 8:35 AM End time: 02/20/2023 8:41 AM Injection made incrementally with aspirations every 5 mL.  Performed by: Personally  Anesthesiologist: Jairo Ben, MD  Additional Notes: Pt identified in Holding room.  Monitors applied. Working IV access confirmed. Timeout, Sterile prep R thigh.  #21ga ECHOgenic Arrow block needle into adductor canal with US guidance.  20cc 0.75% Ropivacaine injected incrementally after negative test dose.  Patient asymptomatic, VSS, no heme aspirated, tolerated well.   Sandford Craze, MD

## 2023-02-20 NOTE — Discharge Instructions (Signed)

## 2023-02-20 NOTE — Op Note (Signed)
Total Knee Arthroplasty Procedure Note  Preoperative diagnosis: Right knee osteoarthritis  Postoperative diagnosis:same  Operative findings: Complete loss joint space from patellofemoral and medial compartments  Operative procedure: Right total knee arthroplasty. CPT [phone]  Surgeon: N. Glee Arvin, MD  Assist: Oneal Grout, PA-C; necessary for the timely completion of procedure and due to complexity of procedure.  Anesthesia: Spinal, regional, local  Tourniquet time: see anesthesia record  Implants used: Zimmer persona Femur: CR 9 Tibia: F Patella: 35 mm Polyethylene: 10 mm medial congruent  Indication: Douglas Carpenter is a 67 y.o. year old male with a history of knee pain. Having failed conservative management, the patient elected to proceed with a total knee arthroplasty.  We have reviewed the risk and benefits of the surgery and they elected to proceed after voicing understanding.  Procedure:  After informed consent was obtained and understanding of the risk were voiced including but not limited to bleeding, infection, damage to surrounding structures including nerves and vessels, blood clots, leg length inequality and the failure to achieve desired results, the operative extremity was marked with verbal confirmation of the patient in the holding area.   The patient was then brought to the operating room and transported to the operating room table in the supine position.  A tourniquet was applied to the operative extremity around the upper thigh. The operative limb was then prepped and draped in the usual sterile fashion and preoperative antibiotics were administered.  A time out was performed prior to the start of surgery confirming the correct extremity, preoperative antibiotic administration, as well as team members, implants and instruments available for the case. Correct surgical site was also confirmed with preoperative radiographs. The limb was then elevated for  exsanguination and the tourniquet was inflated. A midline incision was made and a standard medial parapatellar approach was performed.  The infrapatellar fat pad was removed.  Suprapatellar synovium was removed to reveal the anterior distal femoral cortex.  A medial peel was performed to release the capsule and the deep MCL off of the medial tibial plateau.  The patella was then everted which showed complete loss of articular cartilage and was prepared and sized to a 35 mm.  A cover was placed on the patella for protection from retractors.  The knee was then brought into full flexion and we then turned our attention to the femur.  The cruciates were sacrificed.  Start site was drilled in the femur and the intramedullary distal femoral cutting guide was placed, set at 5 degrees valgus, taking 12 mm of distal resection. The distal cut was made. Osteophytes were then removed.  Next, the proximal tibial cutting guide was placed with appropriate slope, varus/valgus alignment and depth of resection.  The drop rod was attached to confirm that it was aimed at the second metatarsal.  The proximal tibial cut was made taking 4 mm off the low side. Gap blocks were then used to assess the extension gap and alignment, and appropriate soft tissue releases were performed. Attention was turned back to the femur, which was sized using the sizing guide to a size 9. Appropriate rotation of the femoral component was determined using epicondylar axis, Whiteside's line, and assessing the flexion gap under ligament tension. The appropriate size 4-in-1 cutting block was placed and checked with an angel wing and cuts were made. Posterior femoral osteophytes and uncapped bone were then removed with the curved osteotome.  The menisci were removed.  Trial components were placed, and stability was  checked in full extension, mid-flexion, and deep flexion. Proper tibial rotation was determined and marked.  The patella tracked well without a  lateral release.  The femoral lugs were then drilled. Trial components were then removed and tibial preparation performed.  The tibial bone quality was good.  The tibia was sized for a size F component.  Sclerotic bone was drilled. The bony surfaces were irrigated with a pulse lavage and then dried. Bone cement was vacuum mixed on the back table, and the final components sized above were cemented into place.  Antibiotic irrigation was placed in the knee joint and soft tissues while the cement cured.  After cement had finished curing, excess cement was removed. The stability of the construct was re-evaluated throughout a range of motion and found to be acceptable. The trial liner was removed, the knee was copiously irrigated, and the knee was re-evaluated for any excess bone debris. The real polyethylene liner, 10 mm thick, was inserted and checked to ensure the locking mechanism had engaged appropriately. The tourniquet was deflated and hemostasis was achieved. The wound was irrigated with normal saline.  One gram of vancomycin powder was placed in the surgical bed.  Topical 0.25% bupivacaine and meloxicam was placed in the joint for postoperative pain.  Capsular closure was performed with a #1 vicryl, subcutaneous fat closed with a 0 vicryl suture, then subcutaneous tissue closed with interrupted 2.0 vicryl suture. The skin was then closed with a 2.0 nylon and dermabond. A sterile dressing was applied.  The patient was awakened in the operating room and taken to recovery in stable condition. All sponge, needle, and instrument counts were correct at the end of the case.  [name] was necessary for opening, closing, retracting, limb positioning and overall facilitation and completion of the surgery.  Position: supine  Complications: none.  Time Out: performed   Drains/Packing: none  Estimated blood loss: minimal  Returned to Recovery Room: in good condition.   Antibiotics: yes   Mechanical  VTE (DVT) Prophylaxis: sequential compression devices, TED thigh-high  Chemical VTE (DVT) Prophylaxis: aspirin  Fluid Replacement  Crystalloid: see anesthesia record Blood: none  FFP: none   Specimens Removed: 1 to pathology   Sponge and Instrument Count Correct? yes   PACU: portable radiograph - knee AP and Lateral   Plan/RTC: Return in 2 weeks for wound check.   Weight Bearing/Load Lower Extremity: full   Implant Name Type Inv. Item Serial No. Manufacturer Lot No. LRB No. Used Action  CEMENT BONE REFOBACIN [id number] [place] - [id number] Cement CEMENT BONE REFOBACIN R1X40 [place]  ZIMMER RECON(ORTH,TRAU,BIO,SG) [id number] Right 2 Implanted  BSPLAT TIB 5D F CMNT STM RT - [id number] Knees BSPLAT TIB 5D F CMNT STM RT  ZIMMER RECON(ORTH,TRAU,BIO,SG) [id number] Right 1 Implanted  COMP FEM CMT PERSONA SZ9 RT - [id number] Joint COMP FEM CMT PERSONA SZ9 RT  ZIMMER RECON(ORTH,TRAU,BIO,SG) [id number] Right 1 Implanted  INSERT TIBIA KNEE RIGHT 10 - [id number] Joint INSERT TIBIA KNEE RIGHT 10  ZIMMER RECON(ORTH,TRAU,BIO,SG) [id number] Right 1 Implanted  STEM POLY PAT PLY 74M KNEE - [id number] Knees STEM POLY PAT PLY 74M KNEE  ZIMMER RECON(ORTH,TRAU,BIO,SG) [id number] Right 1 Implanted    N. Glee Arvin, MD [facility] 11:30 AM

## 2023-02-20 NOTE — Anesthesia Procedure Notes (Signed)
Spinal  Patient location during procedure: OR End time: 02/20/2023 10:05 AM Reason for block: surgical anesthesia Staffing Performed: anesthesiologist  Anesthesiologist: Jairo Ben, MD Performed by: Jairo Ben, MD Authorized by: Jairo Ben, MD   Preanesthetic Checklist Completed: patient identified, IV checked, site marked, risks and benefits discussed, surgical consent, monitors and equipment checked, pre-op evaluation and timeout performed Spinal Block Patient position: sitting Prep: DuraPrep and site prepped and draped Patient monitoring: blood pressure, continuous pulse ox, cardiac monitor and heart rate Approach: midline Location: L3-4 Injection technique: single-shot Needle Needle type: Pencan and Introducer  Needle gauge: 24 G Needle length: 9 cm Assessment Events: CSF return Additional Notes Pt identified in Operating room.  Monitors applied. Working IV access confirmed. Sterile prep, drape lumbar spine.  1% lido local L 3,4.  #24ga Pencan into clear CSF L 3,4.  15mg  0.75% Bupivacaine with dextrose injected with asp CSF beginning and end of injection.  Patient asymptomatic, VSS, no heme aspirated, tolerated well.  Sandford Craze, MD

## 2023-02-20 NOTE — Anesthesia Procedure Notes (Addendum)
Date/Time: 02/20/2023 9:55 AM  Performed by: Shary Decamp, CRNAPre-anesthesia Checklist: Patient identified, Emergency Drugs available, Suction available, Timeout performed and Patient being monitored Patient Re-evaluated:Patient Re-evaluated prior to induction Oxygen Delivery Method: Simple face mask

## 2023-02-20 NOTE — Anesthesia Postprocedure Evaluation (Signed)
Anesthesia Post Note  Patient: THEOREN SCHRAMM  Procedure(s) Performed: RIGHT TOTAL KNEE ARTHROPLASTY (Right: Knee)     Patient location during evaluation: PACU Anesthesia Type: Spinal and Regional Level of consciousness: awake and alert, oriented and patient cooperative Pain management: pain level controlled Vital Signs Assessment: post-procedure vital signs reviewed and stable Respiratory status: spontaneous breathing, nonlabored ventilation and respiratory function stable Cardiovascular status: blood pressure returned to baseline and stable Postop Assessment: spinal receding, patient able to bend at knees and no apparent nausea or vomiting Anesthetic complications: no   No notable events documented.  Last Vitals:  Vitals:   02/20/23 1257 02/20/23 1553  BP: (!) 135/96 122/88  Pulse: (!) 54 80  Resp: 18 20  Temp: (!) 36.4 C 36.6 C  SpO2: 100% 98%    Last Pain:  Vitals:   02/20/23 1553  TempSrc: Oral  PainSc:                  Wilena Tyndall,E. Shourya Macpherson

## 2023-02-20 NOTE — Transfer of Care (Signed)
Immediate Anesthesia Transfer of Care Note  Patient: Douglas Carpenter  Procedure(s) Performed: RIGHT TOTAL KNEE ARTHROPLASTY (Right: Knee)  Patient Location: PACU  Anesthesia Type:Spinal  Level of Consciousness: awake, alert , oriented, patient cooperative, and responds to stimulation  Airway & Oxygen Therapy: Patient Spontanous Breathing  Post-op Assessment: Report given to RN and Post -op Vital signs reviewed and stable  Post vital signs: Reviewed and stable  Last Vitals:  Vitals Value Taken Time  BP 115/73 02/20/23 1207  Temp    Pulse 63 02/20/23 1209  Resp 12 02/20/23 1209  SpO2 94 % 02/20/23 1209  Vitals shown include unfiled device data.  Last Pain:  Vitals:   02/20/23 0903  TempSrc:   PainSc: 0-No pain      Patients Stated Pain Goal: 0 (02/20/23 0903)  Complications: No notable events documented.

## 2023-02-20 NOTE — Evaluation (Signed)
Physical Therapy Evaluation Patient Details Name: Douglas Carpenter MRN: [id number] DOB: Feb 22, 1956 Today's Date: 02/20/2023  History of Present Illness  67 y.o. male presents to [facility] hospital on 02/20/2023 for R TKA. PMH includes back surgery, asthma, GERD, ureteral stricture, HTN, BPH, HLD, CKD III.  Clinical Impression  Pt presents to PT with deficits in gait, balance, strength, power, ROM, endurance. Pt reports continued numbness in BLE, although able to feel light touch. Pt with mild knee buckling with initial transfer, this is improved with 2nd sit to stand. PT limits ambulation distances due to continued sensation deficits, in an effort to reduce the risk of potential buckling and falls. PT will follow up in the morning to progress ambulation and stair training.        If plan is discharge home, recommend the following: A little help with bathing/dressing/bathroom;Assistance with cooking/housework;Assist for transportation;Help with stairs or ramp for entrance   Can travel by private vehicle        Equipment Recommendations None recommended by PT  Recommendations for Other Services       Functional Status Assessment Patient has had a recent decline in their functional status and demonstrates the ability to make significant improvements in function in a reasonable and predictable amount of time.     Precautions / Restrictions Precautions Precautions: Fall;Knee Precaution Booklet Issued: Yes (comment) Restrictions Weight Bearing Restrictions: Yes RLE Weight Bearing: Weight bearing as tolerated      Mobility  Bed Mobility Overal bed mobility: Needs Assistance Bed Mobility: Supine to Sit, Sit to Supine     Supine to sit: Supervision Sit to supine: Supervision        Transfers Overall transfer level: Needs assistance Equipment used: Rolling walker (2 wheels) Transfers: Sit to/from Stand Sit to Stand: Min assist, Contact guard assist           General transfer  comment: pt with knee buckle with initial transfer attempt, improved quality on 2nd attempt without buckling noted    Ambulation/Gait Ambulation/Gait assistance: Contact guard assist Gait Distance (Feet): 30 Feet Assistive device: Rolling walker (2 wheels) Gait Pattern/deviations: Step-through pattern Gait velocity: reduced Gait velocity interpretation: <1.8 ft/sec, indicate of risk for recurrent falls   General Gait Details: slowed step-through gait, reduced stride length and gait speed.  Stairs            Wheelchair Mobility     Tilt Bed    Modified Rankin (Stroke Patients Only)       Balance Overall balance assessment: Needs assistance Sitting-balance support: No upper extremity supported, Feet supported Sitting balance-Leahy Scale: Good     Standing balance support: Bilateral upper extremity supported, Reliant on assistive device for balance Standing balance-Leahy Scale: Poor                               Pertinent Vitals/Pain Pain Assessment Pain Assessment: No/denies pain    Home Living Family/patient expects to be discharged to:: Private residence Living Arrangements: Spouse/significant other Available Help at Discharge: Family;Available 24 hours/day Type of Home: House Home Access: Stairs to enter Entrance Stairs-Rails: Left Entrance Stairs-Number of Steps: 3   Home Layout: One level Home Equipment: Agricultural consultant (2 wheels);Cane - single point      Prior Function Prior Level of Function : Independent/Modified Independent;Driving                     Extremity/Trunk Assessment   Upper Extremity  Assessment Upper Extremity Assessment: Overall WFL for tasks assessed    Lower Extremity Assessment Lower Extremity Assessment: RLE deficits/detail;LLE deficits/detail RLE Deficits / Details: post-op knee ROM deficits as expected s/p TKA, generalized weakness RLE Sensation: decreased light touch LLE Sensation: decreased light  touch    Cervical / Trunk Assessment Cervical / Trunk Assessment: Normal  Communication   Communication Communication: No apparent difficulties Cueing Techniques: Verbal cues  Cognition Arousal: Alert Behavior During Therapy: WFL for tasks assessed/performed Overall Cognitive Status: Within Functional Limits for tasks assessed                                          General Comments General comments (skin integrity, edema, etc.): VSS on RA    Exercises Other Exercises Other Exercises: PT provides verbal education on TKR exercise packet   Assessment/Plan    PT Assessment Patient needs continued PT services  PT Problem List Decreased strength;Decreased range of motion;Decreased activity tolerance;Decreased balance;Decreased mobility;Decreased knowledge of use of DME;Pain       PT Treatment Interventions DME instruction;Gait training;Stair training;Functional mobility training;Therapeutic activities;Balance training;Therapeutic exercise;Neuromuscular re-education;Patient/family education    PT Goals (Current goals can be found in the Care Plan section)  Acute Rehab PT Goals Patient Stated Goal: to return to independence PT Goal Formulation: With patient Time For Goal Achievement: 02/24/23 Potential to Achieve Goals: Good    Frequency 7X/week     Co-evaluation               AM-PAC PT "6 Clicks" Mobility  Outcome Measure Help needed turning from your back to your side while in a flat bed without using bedrails?: A Little Help needed moving from lying on your back to sitting on the side of a flat bed without using bedrails?: A Little Help needed moving to and from a bed to a chair (including a wheelchair)?: A Little Help needed standing up from a chair using your arms (e.g., wheelchair or bedside chair)?: A Little Help needed to walk in hospital room?: A Little Help needed climbing 3-5 steps with a railing? : Total 6 Click Score: 16    End of  Session Equipment Utilized During Treatment: Gait belt Activity Tolerance: Patient tolerated treatment well Patient left: in bed;with call bell/phone within reach;with family/visitor present Nurse Communication: Mobility status PT Visit Diagnosis: Other abnormalities of gait and mobility (R26.89);Muscle weakness (generalized) (M62.81)    Time: 1610-9604 PT Time Calculation (min) (ACUTE ONLY): 24 min   Charges:   PT Evaluation $PT Eval Low Complexity: 1 Low   PT General Charges $$ ACUTE PT VISIT: 1 Visit         Arlyss Gandy, PT, DPT Acute Rehabilitation Office [phone]   Arlyss Gandy 02/20/2023, 4:02 PM

## 2023-02-20 NOTE — H&P (Signed)
PREOPERATIVE H&P  Chief Complaint: right knee osteoarthritis  HPI: Douglas Carpenter is a 67 y.o. male who presents for surgical treatment of right knee osteoarthritis.  He denies any changes in medical history.  Past Surgical History:  Procedure Laterality Date   BACK SURGERY     CARPAL TUNNEL RELEASE Bilateral    Dr Roda Shutters   COLONOSCOPY     CYSTOSCOPY WITH URETHRAL DILATATION N/A 01/07/2013   Procedure: CYSTOSCOPY WITH URETHRAL DILATATION BALLOON DILATION ;  Surgeon: Valetta Fuller, MD;  Location: [facility];  Service: Urology;  Laterality: N/A;   LUMBAR LAMINECTOMY/DECOMPRESSION MICRODISCECTOMY Right 11/29/2012   Procedure: Right Lumbar Four to Five Laminectomy/ Decompression Microdiskectomy;  Surgeon: Clydene Fake, MD;  Location: [facility];  Service: Neurosurgery;  Laterality: Right;  LUMBAR LAMINECTOMY/DECOMPRESSION MICRODISCECTOMY 1 LEVEL   POLYPECTOMY     SHOULDER ARTHROSCOPY W/ ACROMIAL REPAIR Left 08/28/2002   TOTAL KNEE ARTHROPLASTY Left 12/02/2019   TOTAL KNEE ARTHROPLASTY Left 12/02/2019   Procedure: LEFT TOTAL KNEE ARTHROPLASTY;  Surgeon: Tarry Kos, MD;  Location: [facility] OR;  Service: Orthopedics;  Laterality: Left;   UPPER GASTROINTESTINAL ENDOSCOPY     Social History   Socioeconomic History   Marital status: Married    Spouse name: Not on file   Number of children: Not on file   Years of education: Not on file   Highest education level: Not on file  Occupational History   Occupation: retired  Tobacco Use   Smoking status: Former    Current packs/day: 0.00    Average packs/day: 0.5 packs/day for 22.0 years (11.0 ttl pk-yrs)    Types: Cigarettes    Start date: 06/27/1966    Quit date: 06/27/1988    Years since quitting: 34.6   Smokeless tobacco: Never  Vaping Use   Vaping status: Never Used  Substance and Sexual Activity   Alcohol use: Not Currently    Comment: cognac (has 2-3 drinks for 2-3 days a week)   Drug use: No   Sexual activity:  Yes    Partners: Female  Other Topics Concern   Not on file  Social History Narrative   Not on file   Social Determinants of Health   Financial Resource Strain: Low Risk  (01/31/2022)   Overall Financial Resource Strain (CARDIA)    Difficulty of Paying Living Expenses: Not hard at all  Food Insecurity: No Food Insecurity (01/31/2022)   Hunger Vital Sign    Worried About Running Out of Food in the Last Year: Never true    Ran Out of Food in the Last Year: Never true  Transportation Needs: No Transportation Needs (01/31/2022)   PRAPARE - Administrator, Civil Service (Medical): No    Lack of Transportation (Non-Medical): No  Physical Activity: Sufficiently Active (01/31/2022)   Exercise Vital Sign    Days of Exercise per Week: 5 days    Minutes of Exercise per Session: 40 min  Stress: No Stress Concern Present (01/31/2022)   [employer] of Occupational Health - Occupational Stress Questionnaire    Feeling of Stress : Not at all  Social Connections: Moderately Integrated (01/31/2022)   Social Connection and Isolation Panel [NHANES]    Frequency of Communication with Friends and Family: Three times a week    Frequency of Social Gatherings with Friends and Family: Three times a week    Attends Religious Services: More than 4 times per year    Active Member of Clubs  or Organizations: No    Attends Banker Meetings: Never    Marital Status: Married   Family History  Problem Relation Age of Onset   Cancer Mother        lung   Lung cancer Mother    Heart disease Maternal Grandmother    Heart disease Maternal Grandfather    Cancer Maternal Grandfather        colon, prostate,lung   Colon cancer Neg Hx    Colon polyps Neg Hx    Esophageal cancer Neg Hx    Rectal cancer Neg Hx    Stomach cancer Neg Hx    Allergies  Allergen Reactions   Aspirin Rash    Pt can tolerate low doses-- INTOLERANT TO HIGHER DOSES   Prior to Admission medications   Medication Sig  Start Date End Date Taking? Authorizing Provider  albuterol (PROAIR HFA) 108 (90 Base) MCG/ACT inhaler Inhale 2 puffs into the lungs every 6 (six) hours as needed for wheezing or shortness of breath. 04/18/18  Yes Coralyn Helling, MD  albuterol (PROVENTIL) (2.5 MG/3ML) 0.083% nebulizer solution Take 3 mLs (2.5 mg total) by nebulization every 6 (six) hours as needed for wheezing or shortness of breath. 11/27/20  Yes Coralyn Helling, MD  aspirin EC 81 MG tablet Take 1 tablet (81 mg total) by mouth 2 (two) times daily. Patient taking differently: Take 81 mg by mouth daily. 12/01/19  Yes Tarry Kos, MD  atorvastatin (LIPITOR) 80 MG tablet Take 1 tablet (80 mg total) by mouth at bedtime. 08/04/20  Yes Etta Grandchild, MD  benzonatate (TESSALON) 100 MG capsule Take 100 mg by mouth 2 (two) times daily.   Yes [provider]  carvedilol (COREG) 12.5 MG tablet Take 1 tablet (12.5 mg total) by mouth 2 (two) times daily with a meal. Patient taking differently: Take 6.25 mg by mouth 2 (two) times daily with a meal. 09/09/21  Yes Etta Grandchild, MD  Erenumab-aooe (AIMOVIG) 70 MG/ML SOAJ Inject 70 mg into the skin every 30 (thirty) days.   Yes [provider]  gabapentin (NEURONTIN) 300 MG capsule Take 300 mg by mouth 3 (three) times daily. 09/18/19  Yes [provider]  indapamide (LOZOL) 2.5 MG tablet Take 1 tablet (2.5 mg total) by mouth daily. 09/09/21  Yes Etta Grandchild, MD  montelukast (SINGULAIR) 10 MG tablet Take 1 tablet (10 mg total) by mouth at bedtime. 07/08/20  Yes Coralyn Helling, MD  omeprazole (PRILOSEC) 40 MG capsule Take 40 mg by mouth daily.  08/21/19  Yes [provider]  potassium chloride SA (KLOR-CON M20) 20 MEQ tablet Take 1 tablet (20 mEq total) by mouth 2 (two) times daily. 09/22/21  Yes Etta Grandchild, MD  celecoxib (CELEBREX) 200 MG capsule Take 1 capsule (200 mg total) by mouth 2 (two) times daily. Patient not taking: Reported on 02/03/2023 12/01/22   Cristie Hem, PA-C  diclofenac (VOLTAREN) 75 MG EC tablet Take 1 tablet (75 mg total) by mouth 2 (two) times daily. Patient not taking: Reported on 02/03/2023 07/20/22   Tarry Kos, MD  diclofenac Sodium (VOLTAREN) 1 % GEL Apply 2 g topically 4 (four) times daily. Patient not taking: Reported on 02/03/2023 09/22/20   Cristie Hem, PA-C  fluticasone-salmeterol [facility] INHUB) 250-50 MCG/ACT AEPB Inhale 1 puff into the lungs in the morning and at bedtime. 08/19/21   Coralyn Helling, MD  olopatadine (PATADAY) 0.1 % ophthalmic solution Place 1 drop into both  eyes 2 (two) times daily. Patient not taking: Reported on 02/03/2023 10/15/18   Belinda Fisher, PA-C  terbinafine (LAMISIL) 250 MG tablet Take 1 tablet (250 mg total) by mouth daily. Patient not taking: Reported on 02/03/2023 09/28/22   Lenn Sink, DPM     Positive ROS: All other systems have been reviewed and were otherwise negative with the exception of those mentioned in the HPI and as above.  Physical Exam: General: Alert, no acute distress Cardiovascular: No pedal edema Respiratory: No cyanosis, no use of accessory musculature GI: abdomen soft Skin: No lesions in the area of chief complaint Neurologic: Sensation intact distally Psychiatric: Patient is competent for consent with normal mood and affect Lymphatic: no lymphedema  MUSCULOSKELETAL: exam stable  Assessment: right knee osteoarthritis  Plan: Plan for Procedure(s): RIGHT TOTAL KNEE ARTHROPLASTY  The risks benefits and alternatives were discussed with the patient including but not limited to the risks of nonoperative treatment, versus surgical intervention including infection, bleeding, nerve injury,  blood clots, cardiopulmonary complications, morbidity, mortality, among others, and they were willing to proceed.   Glee Arvin, MD 02/20/2023 8:45 AM

## 2023-02-21 ENCOUNTER — Encounter (HOSPITAL_COMMUNITY): Payer: Self-pay | Admitting: Orthopaedic Surgery

## 2023-02-21 ENCOUNTER — Other Ambulatory Visit: Payer: Self-pay | Admitting: Physician Assistant

## 2023-02-21 DIAGNOSIS — M1711 Unilateral primary osteoarthritis, right knee: Secondary | ICD-10-CM | POA: Diagnosis not present

## 2023-02-21 LAB — CBC
HCT: 38 % — ABNORMAL LOW (ref 39.0–52.0)
Hemoglobin: 12.9 g/dL — ABNORMAL LOW (ref 13.0–17.0)
MCH: 28.9 pg (ref 26.0–34.0)
MCHC: 33.9 g/dL (ref 30.0–36.0)
MCV: 85 fL (ref 80.0–100.0)
Platelets: 244 10*3/uL (ref 150–400)
RBC: 4.47 MIL/uL (ref 4.22–5.81)
RDW: 12.8 % (ref 11.5–15.5)
WBC: 8.8 10*3/uL (ref 4.0–10.5)
nRBC: 0 % (ref 0.0–0.2)

## 2023-02-21 NOTE — Plan of Care (Signed)
  Problem: Education: Goal: Knowledge of the prescribed therapeutic regimen will improve Outcome: Completed/Met Goal: Individualized Educational Video(s) Outcome: Completed/Met   Problem: Activity: Goal: Ability to avoid complications of mobility impairment will improve Outcome: Completed/Met Goal: Range of joint motion will improve Outcome: Completed/Met   Problem: Clinical Measurements: Goal: Postoperative complications will be avoided or minimized Outcome: Completed/Met   Problem: Pain Management: Goal: Pain level will decrease with appropriate interventions Outcome: Completed/Met   Problem: Skin Integrity: Goal: Will show signs of wound healing Outcome: Completed/Met   Problem: Education: Goal: Knowledge of General Education information will improve Description: Including pain rating scale, medication(s)/side effects and non-pharmacologic comfort measures Outcome: Completed/Met Patient alert and oriented, void ambulate. D/c instructions given, all questions answered. Surgical clean and dry no sign of infection.

## 2023-02-21 NOTE — Progress Notes (Signed)
Physical Therapy Treatment Patient Details Name: Douglas Carpenter MRN: [id number] DOB: Nov 17, 1955 Today's Date: 02/21/2023   History of Present Illness 67 y.o. male presents to [facility] hospital on 02/20/2023 for R TKA. PMH includes back surgery, asthma, GERD, ureteral stricture, HTN, BPH, HLD, CKD III.    PT Comments  Pt is mobilizing very well, ambulating for household distances and negotiating a flight of stairs without balance deviation. PT provides reinforcement of the need for frequent mobilization with a gradual increase in activity tolerance. PT encourages continued performance of HEP. Pt has met all goals and has no further questions or concerns for PT at this time. PT signing off.    If plan is discharge home, recommend the following: A little help with bathing/dressing/bathroom;Assistance with cooking/housework;Assist for transportation;Help with stairs or ramp for entrance   Can travel by private vehicle        Equipment Recommendations  None recommended by PT    Recommendations for Other Services       Precautions / Restrictions Precautions Precautions: Fall;Knee Precaution Booklet Issued: Yes (comment) Restrictions Weight Bearing Restrictions: Yes RLE Weight Bearing: Weight bearing as tolerated     Mobility  Bed Mobility Overal bed mobility: Modified Independent Bed Mobility: Supine to Sit     Supine to sit: Modified independent (Device/Increase time)          Transfers Overall transfer level: Modified independent Equipment used: Rolling walker (2 wheels) Transfers: Sit to/from Stand Sit to Stand: Modified independent (Device/Increase time)                Ambulation/Gait Ambulation/Gait assistance: Modified independent (Device/Increase time) Gait Distance (Feet): 250 Feet Assistive device: Rolling walker (2 wheels) Gait Pattern/deviations: Step-through pattern Gait velocity: reduced Gait velocity interpretation: 1.31 - 2.62 ft/sec, indicative of  limited community ambulator   General Gait Details: slowed step-through gait   Stairs Stairs: Yes Stairs assistance: Modified independent (Device/Increase time) Stair Management: One rail Left, Step to pattern, Forwards Number of Stairs: 10 General stair comments: verbal cues for sequencing   Wheelchair Mobility     Tilt Bed    Modified Rankin (Stroke Patients Only)       Balance Overall balance assessment: Needs assistance Sitting-balance support: No upper extremity supported, Feet supported Sitting balance-Leahy Scale: Good     Standing balance support: No upper extremity supported, During functional activity Standing balance-Leahy Scale: Fair                              Cognition Arousal: Alert Behavior During Therapy: WFL for tasks assessed/performed Overall Cognitive Status: Within Functional Limits for tasks assessed                                          Exercises Total Joint Exercises Goniometric ROM: 92 degrees flexion, 2 degrees from full extension Other Exercises Other Exercises: pt denies concerns about TKR exercise packet, demonstrates heel slides and ankle pumps    General Comments General comments (skin integrity, edema, etc.): VSS on RA      Pertinent Vitals/Pain Pain Assessment Pain Assessment: Faces Faces Pain Scale: Hurts a little bit Pain Location: R knee Pain Descriptors / Indicators: Sore Pain Intervention(s): Monitored during session    Home Living Family/patient expects to be discharged to:: Private residence Living Arrangements: Spouse/significant other Available Help at Discharge: Family;Available 24 hours/day  Type of Home: House Home Access: Stairs to enter Entrance Stairs-Rails: Left Entrance Stairs-Number of Steps: 3   Home Layout: One level Home Equipment: Agricultural consultant (2 wheels);Cane - single point;Shower seat      Prior Function            PT Goals (current goals can now be  found in the care plan section) Acute Rehab PT Goals Patient Stated Goal: to return to independence Progress towards PT goals: Goals met/education completed, patient discharged from PT    Frequency    Min 1X/week      PT Plan      Co-evaluation              AM-PAC PT "6 Clicks" Mobility   Outcome Measure  Help needed turning from your back to your side while in a flat bed without using bedrails?: None Help needed moving from lying on your back to sitting on the side of a flat bed without using bedrails?: None Help needed moving to and from a bed to a chair (including a wheelchair)?: None Help needed standing up from a chair using your arms (e.g., wheelchair or bedside chair)?: None Help needed to walk in hospital room?: None Help needed climbing 3-5 steps with a railing? : None 6 Click Score: 24    End of Session   Activity Tolerance: Patient tolerated treatment well Patient left: in bed;with call bell/phone within reach Nurse Communication: Mobility status PT Visit Diagnosis: Other abnormalities of gait and mobility (R26.89);Muscle weakness (generalized) (M62.81)     Time: 5784-6962 PT Time Calculation (min) (ACUTE ONLY): 11 min  Charges:    $Gait Training: 8-22 mins PT General Charges $$ ACUTE PT VISIT: 1 Visit                     Arlyss Gandy, PT, DPT Acute Rehabilitation Office [phone]    Arlyss Gandy 02/21/2023, 8:49 AM

## 2023-02-21 NOTE — Evaluation (Signed)
Occupational Therapy Evaluation Patient Details Name: Douglas Carpenter MRN: [id number] DOB: Jul 26, 1955 Today's Date: 02/21/2023   History of Present Illness 67 y.o. male presents to [facility] hospital on 02/20/2023 for R TKA. PMH includes back surgery, asthma, GERD, ureteral stricture, HTN, BPH, HLD, CKD III.   Clinical Impression   Pt ind at baseline with ADLs/functional mobility, lives with spouse who can provide 24/7 assist at d/c. Pt currently needing set up -min A for ADLs, supervision for bed mobility and supervision for transfers with and without RW in room. Pt educated on compensatory strategies for LB ADLs, along with not placing pillow under knee at rest. Pt presenting with impairments listed below, will follow acutely. Follow physician recommendations for follow up therapy needs.       If plan is discharge home, recommend the following: A little help with bathing/dressing/bathroom;Assistance with cooking/housework;Assist for transportation    Functional Status Assessment  Patient has had a recent decline in their functional status and demonstrates the ability to make significant improvements in function in a reasonable and predictable amount of time.  Equipment Recommendations  None recommended by OT (pt has all needed DME)    Recommendations for Other Services PT consult     Precautions / Restrictions Precautions Precautions: Fall;Knee Restrictions Weight Bearing Restrictions: Yes RLE Weight Bearing: Weight bearing as tolerated      Mobility Bed Mobility Overal bed mobility: Needs Assistance Bed Mobility: Supine to Sit, Sit to Supine     Supine to sit: Supervision Sit to supine: Supervision        Transfers Overall transfer level: Needs assistance Equipment used: Rolling walker (2 wheels), None Transfers: Sit to/from Stand Sit to Stand: Supervision                  Balance Overall balance assessment: Needs assistance Sitting-balance support: No upper  extremity supported, Feet supported Sitting balance-Leahy Scale: Good     Standing balance support: Bilateral upper extremity supported, Reliant on assistive device for balance Standing balance-Leahy Scale: Fair                             ADL either performed or assessed with clinical judgement   ADL Overall ADL's : Needs assistance/impaired Eating/Feeding: Set up;Sitting   Grooming: Set up;Sitting   Upper Body Bathing: Set up;Sitting   Lower Body Bathing: Minimal assistance;Sitting/lateral leans   Upper Body Dressing : Set up;Sitting   Lower Body Dressing: Minimal assistance;Sitting/lateral leans   Toilet Transfer: Supervision/safety;Ambulation;Regular Social worker and Hygiene: Supervision/safety       Functional mobility during ADLs: Supervision/safety       Vision   Vision Assessment?: No apparent visual deficits     Perception Perception: Not tested       Praxis Praxis: Not tested       Pertinent Vitals/Pain Pain Assessment Pain Assessment: No/denies pain     Extremity/Trunk Assessment Upper Extremity Assessment Upper Extremity Assessment: Overall WFL for tasks assessed (reports arthritis in hands)   Lower Extremity Assessment Lower Extremity Assessment: Defer to PT evaluation   Cervical / Trunk Assessment Cervical / Trunk Assessment: Normal   Communication Communication Communication: No apparent difficulties   Cognition Arousal: Alert Behavior During Therapy: WFL for tasks assessed/performed Overall Cognitive Status: Within Functional Limits for tasks assessed  General Comments  VSS    Exercises     Shoulder Instructions      Home Living Family/patient expects to be discharged to:: Private residence Living Arrangements: Spouse/significant other Available Help at Discharge: Family;Available 24 hours/day Type of Home: House Home Access:  Stairs to enter [employer] of Steps: 3 Entrance Stairs-Rails: Left Home Layout: One level     Bathroom Shower/Tub: Chief Strategy Officer: Handicapped height Bathroom Accessibility: No   Home Equipment: Agricultural consultant (2 wheels);Cane - single point;Shower seat          Prior Functioning/Environment Prior Level of Function : Independent/Modified Independent;Driving                        OT Problem List: Decreased strength;Decreased range of motion;Decreased activity tolerance;Decreased knowledge of precautions;Impaired balance (sitting and/or standing)      OT Treatment/Interventions: Self-care/ADL training;Therapeutic exercise;Energy conservation;DME and/or AE instruction;Therapeutic activities;Patient/family education;Balance training    OT Goals(Current goals can be found in the care plan section) Acute Rehab OT Goals Patient Stated Goal: none stated OT Goal Formulation: With patient Time For Goal Achievement: 03/07/23 Potential to Achieve Goals: Good  OT Frequency: Min 1X/week    Co-evaluation              AM-PAC OT "6 Clicks" Daily Activity     Outcome Measure Help from another person eating meals?: None Help from another person taking care of personal grooming?: None Help from another person toileting, which includes using toliet, bedpan, or urinal?: A Little Help from another person bathing (including washing, rinsing, drying)?: A Little Help from another person to put on and taking off regular upper body clothing?: A Little Help from another person to put on and taking off regular lower body clothing?: A Little 6 Click Score: 20   End of Session Nurse Communication: Mobility status  Activity Tolerance: Patient tolerated treatment well Patient left: in bed;with call bell/phone within reach;with family/visitor present  OT Visit Diagnosis: Unsteadiness on feet (R26.81);Other abnormalities of gait and mobility (R26.89);Muscle  weakness (generalized) (M62.81)                Time: 1324-4010 OT Time Calculation (min): 16 min Charges:  OT General Charges $OT Visit: 1 Visit OT Evaluation $OT Eval Low Complexity: 1 Low  Carver Fila, OTD, OTR/L SecureChat Preferred Acute Rehab [phone]   Carver Fila Koonce 02/21/2023, 8:46 AM

## 2023-02-21 NOTE — Discharge Summary (Signed)
Patient ID: Douglas Carpenter MRN: [id number] DOB/AGE: 1955-12-07 67 y.o.  Admit date: 02/20/2023 Discharge date: 02/21/2023  Admission Diagnoses:  Principal Problem:   Primary osteoarthritis of right knee Active Problems:   Status post total right knee replacement   Discharge Diagnoses:  Same  Past Medical History:  Diagnosis Date   Allergy    Arthritis    "everywhere"   BPH (benign prostatic hypertrophy)    Chronic rhinitis 01/27/2009   ED (erectile dysfunction)    GERD (gastroesophageal reflux disease)    Headache(784.0)    migraines, as many as 3 in a week     HYPERLIPIDEMIA 01/27/2009   HYPERTENSION 01/27/2009   Mild asthma    seasonal allergies, uses Proair once per yr.    Mild obstructive sleep apnea    per pt -- mild osa test result--  no rx cpap needed   Neuromuscular disorder (HCC)    Sleep apnea    no cpap   SOB (shortness of breath)    Testosterone deficiency    Urethral stricture    Vocal cord anomaly    pt states told from [place]  doctor heard a little gurgle sound -- no farther testing done    Surgeries: Procedure(s): RIGHT TOTAL KNEE ARTHROPLASTY on 02/20/2023   Consultants:   Discharged Condition: Improved  Hospital Course: AASHIR PORTWOOD is an 67 y.o. male who was admitted 02/20/2023 for operative treatment ofPrimary osteoarthritis of right knee. Patient has severe unremitting pain that affects sleep, daily activities, and work/hobbies. After pre-op clearance the patient was taken to the operating room on 02/20/2023 and underwent  Procedure(s): RIGHT TOTAL KNEE ARTHROPLASTY.    Patient was given perioperative antibiotics:  Anti-infectives (From admission, onward)    Start     Dose/Rate Route Frequency Ordered Stop   02/20/23 1600  ceFAZolin (ANCEF) IVPB 2g/100 mL premix        2 g 200 mL/hr over 30 Minutes Intravenous Every 6 hours 02/20/23 1303 02/20/23 2213   02/20/23 1041  vancomycin (VANCOCIN) powder  Status:  Discontinued          As needed  02/20/23 1042 02/20/23 1204   02/20/23 0730  ceFAZolin (ANCEF) IVPB 2g/100 mL premix        2 g 200 mL/hr over 30 Minutes Intravenous On call to O.R. 02/20/23 0717 02/20/23 1025        Patient was given sequential compression devices, early ambulation, and chemoprophylaxis to prevent DVT.  Patient benefited maximally from hospital stay and there were no complications.    Recent vital signs: Patient Vitals for the past 24 hrs:  BP Temp Temp src Pulse Resp SpO2  02/21/23 0438 (!) 147/86 (!) 97.5 F (36.4 C) Oral 72 20 98 %  02/20/23 2358 137/85 97.7 F (36.5 C) Oral 74 20 97 %  02/20/23 1941 (!) 146/74 98 F (36.7 C) Oral 82 20 96 %  02/20/23 1553 122/88 97.8 F (36.6 C) Oral 80 20 98 %  02/20/23 1257 (!) 135/96 (!) 97.5 F (36.4 C) Oral (!) 54 18 100 %  02/20/23 1240 111/64 98.1 F (36.7 C) -- (!) 57 11 94 %  02/20/23 1225 112/72 -- -- (!) 55 13 96 %  02/20/23 1210 115/73 99.2 F (37.3 C) -- 63 12 94 %  02/20/23 0910 130/85 -- -- 79 14 98 %  02/20/23 0903 112/68 -- -- 78 16 98 %  02/20/23 0900 129/77 -- -- 77 17 98 %  02/20/23 0855 114/78 -- --  76 (!) 9 97 %  02/20/23 0850 134/88 -- -- 77 14 100 %  02/20/23 0845 135/86 -- -- 79 17 99 %     Recent laboratory studies: No results for input(s): "WBC", "HGB", "HCT", "PLT", "NA", "K", "CL", "CO2", "BUN", "CREATININE", "GLUCOSE", "INR", "CALCIUM" in the last 72 hours.  Invalid input(s): "PT", "2"   Discharge Medications:   Allergies as of 02/21/2023       Reactions   Aspirin Rash   Pt can tolerate low doses-- INTOLERANT TO HIGHER DOSES        Medication List     STOP taking these medications    celecoxib 200 MG capsule Commonly known as: CELEBREX   diclofenac 75 MG EC tablet Commonly known as: VOLTAREN   diclofenac Sodium 1 % Gel Commonly known as: Voltaren   olopatadine 0.1 % ophthalmic solution Commonly known as: Pataday       TAKE these medications    Aimovig 70 MG/ML Soaj Generic drug:  Erenumab-aooe Inject 70 mg into the skin every 30 (thirty) days.   albuterol 108 (90 Base) MCG/ACT inhaler Commonly known as: ProAir HFA Inhale 2 puffs into the lungs every 6 (six) hours as needed for wheezing or shortness of breath.   albuterol (2.5 MG/3ML) 0.083% nebulizer solution Commonly known as: PROVENTIL Take 3 mLs (2.5 mg total) by nebulization every 6 (six) hours as needed for wheezing or shortness of breath.   aspirin EC 81 MG tablet Take 1 tablet (81 mg total) by mouth 2 (two) times daily. To be taken after surgery to prevent blood clots What changed: additional instructions   atorvastatin 80 MG tablet Commonly known as: LIPITOR Take 1 tablet (80 mg total) by mouth at bedtime.   benzonatate 100 MG capsule Commonly known as: TESSALON Take 100 mg by mouth 2 (two) times daily.   carvedilol 12.5 MG tablet Commonly known as: COREG Take 1 tablet (12.5 mg total) by mouth 2 (two) times daily with a meal. What changed: how much to take   docusate sodium 100 MG capsule Commonly known as: Colace Take 1 capsule (100 mg total) by mouth daily as needed.   fluticasone-salmeterol 250-50 MCG/ACT Aepb Commonly known as: Wixela Inhub Inhale 1 puff into the lungs in the morning and at bedtime.   gabapentin 300 MG capsule Commonly known as: NEURONTIN Take 300 mg by mouth 3 (three) times daily.   indapamide 2.5 MG tablet Commonly known as: LOZOL Take 1 tablet (2.5 mg total) by mouth daily.   methocarbamol 750 MG tablet Commonly known as: Robaxin-750 Take 1 tablet (750 mg total) by mouth 2 (two) times daily as needed for muscle spasms.   montelukast 10 MG tablet Commonly known as: SINGULAIR Take 1 tablet (10 mg total) by mouth at bedtime.   omeprazole 40 MG capsule Commonly known as: PRILOSEC Take 40 mg by mouth daily.   ondansetron 4 MG tablet Commonly known as: Zofran Take 1 tablet (4 mg total) by mouth every 8 (eight) hours as needed for nausea or vomiting.    oxyCODONE-acetaminophen 5-325 MG tablet Commonly known as: Percocet Take 1-2 tablets by mouth every 6 (six) hours as needed.   potassium chloride SA 20 MEQ tablet Commonly known as: Klor-Con M20 Take 1 tablet (20 mEq total) by mouth 2 (two) times daily.   terbinafine 250 MG tablet Commonly known as: LamISIL Take 1 tablet (250 mg total) by mouth daily.  Durable Medical Equipment  (From admission, onward)           Start     Ordered   02/20/23 1303  DME Walker rolling  Once       Question Answer Comment  Walker: With 5 Inch Wheels   Patient needs a walker to treat with the following condition Status post left partial knee replacement      02/20/23 1303   02/20/23 1303  DME 3 n 1  Once        02/20/23 1303   02/20/23 1303  DME Bedside commode  Once       Question:  Patient needs a bedside commode to treat with the following condition  Answer:  Status post left partial knee replacement   02/20/23 1303            Diagnostic Studies: DG Knee Right Port  Result Date: 02/20/2023 CLINICAL DATA:  Postop right knee. EXAM: PORTABLE RIGHT KNEE - 1-2 VIEW COMPARISON:  Right knee radiographs 11/29/2022 FINDINGS: Interval total right knee arthroplasty. No perihardware lucency is seen to indicate hardware failure or loosening. Expected postoperative changes including intra-articular and subcutaneous air. Moderate joint effusion. No acute fracture or dislocation. IMPRESSION: Interval total right knee arthroplasty without evidence of hardware failure or loosening. Electronically Signed   By: Neita Garnet M.D.   On: 02/20/2023 15:15    Disposition: Discharge disposition: 01-Home or Self Care          Follow-up Information     Cristie Hem, PA-C. Schedule an appointment as soon as possible for a visit in 1 week(s).   Specialty: Orthopedic Surgery Contact information: [address] [place] [address] [phone]         [facility] Follow up.   Why: [facility]--the home health agency will contact you for the first home visit. Contact information: [address] [place] [address] [phone]                  Signed: Cristie Hem 02/21/2023, 7:45 AM

## 2023-02-21 NOTE — Progress Notes (Signed)
Subjective: 1 Day Post-Op Procedure(s) (LRB): RIGHT TOTAL KNEE ARTHROPLASTY (Right) Patient reports pain as mild.  No complaints this am.  Has been walking the halls with PT.  Has been able to urinate on his own.   Objective: Vital signs in last 24 hours: Temp:  [97.5 F (36.4 C)-99.2 F (37.3 C)] 97.5 F (36.4 C) (08/27 0438) Pulse Rate:  [54-82] 72 (08/27 0438) Resp:  [9-20] 20 (08/27 0438) BP: (111-147)/(64-96) 147/86 (08/27 0438) SpO2:  [94 %-100 %] 98 % (08/27 0438)  Intake/Output from previous day: 08/26 0701 - 08/27 0700 In: 2460 [P.O.:960; I.V.:1300; IV Piggyback:200] Out: 1875 [Urine:1800; Blood:75] Intake/Output this shift: No intake/output data recorded.  No results for input(s): "HGB" in the last 72 hours. No results for input(s): "WBC", "RBC", "HCT", "PLT" in the last 72 hours. No results for input(s): "NA", "K", "CL", "CO2", "BUN", "CREATININE", "GLUCOSE", "CALCIUM" in the last 72 hours. No results for input(s): "LABPT", "INR" in the last 72 hours.  Neurologically intact Neurovascular intact Sensation intact distally Intact pulses distally Dorsiflexion/Plantar flexion intact Incision: scant drainage No cellulitis present Compartment soft   Assessment/Plan: 1 Day Post-Op Procedure(s) (LRB): RIGHT TOTAL KNEE ARTHROPLASTY (Right) Advance diet Up with therapy D/C IV fluids Discharge home with home health once cleared by PT WBAT RLE      Cristie Hem 02/21/2023, 7:44 AM

## 2023-02-23 ENCOUNTER — Telehealth: Payer: Self-pay | Admitting: *Deleted

## 2023-02-23 NOTE — Telephone Encounter (Signed)
Betsy with [facility] called requesting orders for patient for therapy. 2wk2, 1wk1. Called back and gave verbal order on behalf of Dr. Roda Shutters.

## 2023-02-26 ENCOUNTER — Other Ambulatory Visit: Payer: Self-pay | Admitting: Physician Assistant

## 2023-02-26 MED ORDER — OXYCODONE-ACETAMINOPHEN 5-325 MG PO TABS: 1.0000 | ORAL_TABLET | ORAL | 0 refills | Status: DC | PRN

## 2023-02-26 MED ORDER — METHOCARBAMOL 750 MG PO TABS: 750.0000 mg | ORAL_TABLET | Freq: Two times a day (BID) | ORAL | 2 refills | Status: AC | PRN

## 2023-03-02 ENCOUNTER — Ambulatory Visit: Payer: TRICARE For Life (TFL) | Admitting: Physician Assistant

## 2023-03-02 ENCOUNTER — Encounter: Payer: Self-pay | Admitting: Physician Assistant

## 2023-03-02 ENCOUNTER — Ambulatory Visit (HOSPITAL_COMMUNITY)
Admission: RE | Admit: 2023-03-02 | Discharge: 2023-03-02 | Disposition: A | Discharge status: Home / Self Care | Payer: Medicare PPO | Source: Ambulatory Visit | Attending: Physician Assistant | Admitting: Physician Assistant

## 2023-03-02 DIAGNOSIS — M79661 Pain in right lower leg: Secondary | ICD-10-CM

## 2023-03-02 NOTE — Progress Notes (Signed)
Office Visit Note   Patient: Douglas Carpenter           Date of Birth: December 08, 1955           MRN: [id number] Visit Date: 03/02/2023              Requested by: Corliss Blacker, MD [address] [place],  [place] [address] PCP: Lourena Simmonds, Donald Pore, MD  Chief Complaint  Patient presents with   Right Knee - Routine Post Op      HPI: Douglas Carpenter is a pleasant 67 year old gentleman who is a patient of Dr. Roda Shutters.  He is approximately 9 days status post right total knee arthroplasty.  He has had a fairly uneventful course and has had a similar procedure on his left leg.  Over the course of the last few days he has been having increasing pain in his right calf.  He also gets some achiness in the front of his tibia.  Denies any fever or chills.  He has asthma no significant increase in shortness of breath.  He has been taking 1 baby aspirin daily  Assessment & Plan: Visit Diagnoses:  1. Pain in right lower leg     Plan: Patient's incision looks quite good is well opposed wound edges there is no evidence of any cellulitis or infection.  Swelling is overall well-controlled.  He does have some bruising in his lower leg.  Has tenderness in the calf.  Equivocal Homans' sign.  He is neurovascular intact.  He otherwise appears well.  He I do recommend an ultrasound given his calf pain.  I have explained to he and his wife that if this is negative this is probably just secondary to hematoma.  Will have a regular follow-up with Dr. Dr. Roda Shutters  Follow-Up Instructions: No follow-ups on file.   Ortho Exam  Patient is alert, oriented, no adenopathy, well-dressed, normal affect, normal respiratory effort. Examination of his right knee dressing was removed he has a well healing surgical incision sutures are in place there is no minimal effusion.  No cellulitis.  No drainage.  No dehiscence.  He has good dorsiflexion and plantarflexion.  Has very minimal tenderness with passive dorsiflexion.  He is tender  in the calf of his leg.  Imaging: No results found. No images are attached to the encounter.  Labs: Lab Results  Component Value Date   HGBA1C 6.5 03/18/2021   HGBA1C 6.3 08/03/2020   ESRSEDRATE 36 (H) 10/16/2018   CRP 3 10/16/2018     Lab Results  Component Value Date   ALBUMIN 4.2 08/03/2020   ALBUMIN 4.0 11/28/2019   ALBUMIN 3.9 10/15/2018    Lab Results  Component Value Date   MG 1.9 03/18/2021   Lab Results  Component Value Date   VD25OH 35.15 08/03/2020    No results found for: "PREALBUMIN"    Latest Ref Rng & Units 02/21/2023    7:24 AM 02/13/2023    8:30 AM 12/13/2022    9:53 AM  CBC EXTENDED  WBC 4.0 - 10.5 K/uL 8.8  3.8  4.7   RBC 4.22 - 5.81 MIL/uL 4.47  4.97  4.65   Hemoglobin 13.0 - 17.0 g/dL 13.0  86.5  78.4   HCT 39.0 - 52.0 % 38.0  43.2  41.9   Platelets 150 - 400 K/uL 244  238  255   NEUT# 1.4 - 7.0 x10E3/uL   2.7   Lymph# 0.7 - 3.1 x10E3/uL   1.4  There is no height or weight on file to calculate BMI.  Orders:  Orders Placed This Encounter  Procedures   VAS [place] LOWER EXTREMITY VENOUS (DVT)   No orders of the defined types were placed in this encounter.    Procedures: No procedures performed  Clinical Data: No additional findings.  ROS:  All other systems negative, except as noted in the HPI. Review of Systems  Objective: Vital Signs: There were no vitals taken for this visit.  Specialty Comments:  No specialty comments available.  PMFS History: Patient Active Problem List   Diagnosis Date Noted   Status post total right knee replacement 02/20/2023   Pain in left ankle and joints of left foot 07/20/2022   Primary osteoarthritis of first carpometacarpal joint of right hand 07/20/2022   Right carpal tunnel syndrome 11/11/2021   Acute asthmatic bronchitis 06/14/2021   Other headache syndrome 05/17/2021   Body aches 05/17/2021   Acute cough 05/17/2021   Shortness of breath 05/17/2021   Influenza A 05/17/2021    Arthritis of carpometacarpal (CMC) joint of right thumb 04/05/2021   Bilateral hand numbness 04/05/2021   Type II diabetes mellitus with manifestations (HCC) 03/18/2021   Diuretic-induced hypokalemia 10/22/2020   Encounter for general adult medical examination with abnormal findings 08/05/2020   Rising PSA level 08/04/2020   Stage 3a chronic kidney disease (HCC) 08/04/2020   Prediabetes 08/03/2020   Primary hypertension 08/03/2020   Benign prostatic hyperplasia without lower urinary tract symptoms 08/03/2020   Hyperlipidemia with target LDL less than 130 08/03/2020   Primary osteoarthritis of left knee 04/16/2019   Primary osteoarthritis of right knee 02/20/2019   Exercise-induced asthma 07/21/2014   Chronic cough 07/21/2014   Upper airway cough syndrome 07/21/2014   Allergic rhinitis 07/21/2014   Lumbar stenosis 11/06/2013   Urethral stricture 01/07/2013   VOCAL CORD DISORDER 02/19/2009   Asthma 01/27/2009   GERD 01/27/2009   Past Medical History:  Diagnosis Date   Allergy    Arthritis    "everywhere"   BPH (benign prostatic hypertrophy)    Chronic rhinitis 01/27/2009   ED (erectile dysfunction)    GERD (gastroesophageal reflux disease)    Headache(784.0)    migraines, as many as 3 in a week     HYPERLIPIDEMIA 01/27/2009   HYPERTENSION 01/27/2009   Mild asthma    seasonal allergies, uses Proair once per yr.    Mild obstructive sleep apnea    per pt -- mild osa test result--  no rx cpap needed   Neuromuscular disorder (HCC)    Sleep apnea    no cpap   SOB (shortness of breath)    Testosterone deficiency    Urethral stricture    Vocal cord anomaly    pt states told from [place]  doctor heard a little gurgle sound -- no farther testing done    Family History  Problem Relation Age of Onset   Cancer Mother        lung   Lung cancer Mother    Heart disease Maternal Grandmother    Heart disease Maternal Grandfather    Cancer Maternal Grandfather        colon,  prostate,lung   Colon cancer Neg Hx    Colon polyps Neg Hx    Esophageal cancer Neg Hx    Rectal cancer Neg Hx    Stomach cancer Neg Hx     Past Surgical History:  Procedure Laterality Date   BACK SURGERY     CARPAL  TUNNEL RELEASE Bilateral    Dr Roda Shutters   COLONOSCOPY     CYSTOSCOPY WITH URETHRAL DILATATION N/A 01/07/2013   Procedure: CYSTOSCOPY WITH URETHRAL DILATATION BALLOON DILATION ;  Surgeon: Valetta Fuller, MD;  [facility];  Service: Urology;  Laterality: N/A;   LUMBAR LAMINECTOMY/DECOMPRESSION MICRODISCECTOMY Right 11/29/2012   Procedure: Right Lumbar Four to Five Laminectomy/ Decompression Microdiskectomy;  Surgeon: Clydene Fake, MD;  Location: [facility];  Service: Neurosurgery;  Laterality: Right;  LUMBAR LAMINECTOMY/DECOMPRESSION MICRODISCECTOMY 1 LEVEL   POLYPECTOMY     SHOULDER ARTHROSCOPY W/ ACROMIAL REPAIR Left 08/28/2002   TOTAL KNEE ARTHROPLASTY Left 12/02/2019   TOTAL KNEE ARTHROPLASTY Left 12/02/2019   Procedure: LEFT TOTAL KNEE ARTHROPLASTY;  Surgeon: Tarry Kos, MD;  Location: [facility];  Service: Orthopedics;  Laterality: Left;   TOTAL KNEE ARTHROPLASTY Right 02/20/2023   Procedure: RIGHT TOTAL KNEE ARTHROPLASTY;  Surgeon: Tarry Kos, MD;  Location: [facility];  Service: Orthopedics;  Laterality: Right;   UPPER GASTROINTESTINAL ENDOSCOPY     Social History   Occupational History   Occupation: retired  Tobacco Use   Smoking status: Former    Current packs/day: 0.00    Average packs/day: 0.5 packs/day for 22.0 years (11.0 ttl pk-yrs)    Types: Cigarettes    Start date: 06/27/1966    Quit date: 06/27/1988    Years since quitting: 34.7   Smokeless tobacco: Never  Vaping Use   Vaping status: Never Used  Substance and Sexual Activity   Alcohol use: Not Currently    Comment: cognac (has 2-3 drinks for 2-3 days a week)   Drug use: No   Sexual activity: Yes    Partners: Female

## 2023-03-02 NOTE — Progress Notes (Signed)
Right lower extremity venous duplex has been completed. Preliminary results can be found in CV Proc through chart review.  Results were given to [place] Persons PA.  03/02/23 2:45 PM Olen Cordial RVT

## 2023-03-07 ENCOUNTER — Ambulatory Visit (INDEPENDENT_AMBULATORY_CARE_PROVIDER_SITE_OTHER): Payer: TRICARE For Life (TFL) | Admitting: Physician Assistant

## 2023-03-07 DIAGNOSIS — Z96651 Presence of right artificial knee joint: Secondary | ICD-10-CM

## 2023-03-07 MED ORDER — HYDROCODONE-ACETAMINOPHEN 7.5-325 MG PO TABS: 1.0000 | ORAL_TABLET | Freq: Three times a day (TID) | ORAL | 0 refills | Status: DC | PRN

## 2023-03-07 MED ORDER — METHOCARBAMOL 750 MG PO TABS: 750.0000 mg | ORAL_TABLET | Freq: Three times a day (TID) | ORAL | 2 refills | Status: DC | PRN

## 2023-03-07 NOTE — Progress Notes (Signed)
Post-Op Visit Note   Patient: Douglas Carpenter           Date of Birth: [birth year]           MRN: [id number] Visit Date: 03/07/2023 PCP: Corliss Blacker, MD   Assessment & Plan:  Chief Complaint:  Chief Complaint  Patient presents with   Right Knee - Routine Post Op   Visit Diagnoses:  1. Status post total right knee replacement     Plan: Patient is a pleasant 67 year old gentleman who comes in today 2 weeks status post right total knee replacement 02/20/2023.  He has been complaining of pain distal to the knee since surgery.  Right lower extremity ultrasound was performed on 03/03/2023 which was negative for DVT.  He does have an underlying history of lumbar pathology for which she gets ESI's every 3 months.  Scheduled to have 1 next week.  He denies any paresthesias to the right lower extremity.  He has only been taking 1 aspirin daily.  He has been taking occasional muscle relaxer and oxycodone.  He has been getting home health physical therapy and is ambulating with a walker.  Examination of the right knee reveals a well-healed surgical incision with nylon sutures in place.  No evidence of infection or cellulitis.  Calf is soft and minimally tender.  He is neurovascularly intact distally.  Today, sutures were removed and Steri-Strips applied.  I recommended taking his muscle relaxer and pain medicine more consistently.  We have agreed to switch the oxycodone to hydrocodone.  I reinforced taking a baby aspirin twice daily for the next 4 weeks for DVT prophylaxis.  I sent in a referral for outpatient physical therapy.  He will follow-up with [place] in 4 weeks for repeat evaluation and 2 view x-rays of the right knee.  Call with concerns or questions.  Follow-Up Instructions: Return in about 4 weeks (around 04/04/2023).   Orders:  Orders Placed This Encounter  Procedures   Ambulatory referral to Physical Therapy   Meds ordered this encounter  Medications   HYDROcodone-acetaminophen  (NORCO) 7.5-325 MG tablet    Sig: Take 1-2 tablets by mouth 3 (three) times daily as needed for moderate pain.    Dispense:  40 tablet    Refill:  0   methocarbamol (ROBAXIN-750) 750 MG tablet    Sig: Take 1 tablet (750 mg total) by mouth 3 (three) times daily as needed for muscle spasms.    Dispense:  30 tablet    Refill:  2    Imaging: No new imaging  PMFS History: Patient Active Problem List   Diagnosis Date Noted   Status post total right knee replacement 02/20/2023   Pain in left ankle and joints of left foot 07/20/2022   Primary osteoarthritis of first carpometacarpal joint of right hand 07/20/2022   Right carpal tunnel syndrome 11/11/2021   Acute asthmatic bronchitis 06/14/2021   Other headache syndrome 05/17/2021   Body aches 05/17/2021   Acute cough 05/17/2021   Shortness of breath 05/17/2021   Influenza A 05/17/2021   Arthritis of carpometacarpal (CMC) joint of right thumb 04/05/2021   Bilateral hand numbness 04/05/2021   Type II diabetes mellitus with manifestations (HCC) 03/18/2021   Diuretic-induced hypokalemia 10/22/2020   Encounter for general adult medical examination with abnormal findings 08/05/2020   Rising PSA level 08/04/2020   Stage 3a chronic kidney disease (HCC) 08/04/2020   Prediabetes 08/03/2020   Primary hypertension 08/03/2020   Benign prostatic hyperplasia without lower  urinary tract symptoms 08/03/2020   Hyperlipidemia with target LDL less than 130 08/03/2020   Primary osteoarthritis of left knee 04/16/2019   Primary osteoarthritis of right knee 02/20/2019   Exercise-induced asthma 07/21/2014   Chronic cough 07/21/2014   Upper airway cough syndrome 07/21/2014   Allergic rhinitis 07/21/2014   Lumbar stenosis 11/06/2013   Urethral stricture 01/07/2013   VOCAL CORD DISORDER 02/19/2009   Asthma 01/27/2009   GERD 01/27/2009   Past Medical History:  Diagnosis Date   Allergy    Arthritis    "everywhere"   BPH (benign prostatic hypertrophy)     Chronic rhinitis 01/27/2009   ED (erectile dysfunction)    GERD (gastroesophageal reflux disease)    Headache(784.0)    migraines, as many as 3 in a week     HYPERLIPIDEMIA 01/27/2009   HYPERTENSION 01/27/2009   Mild asthma    seasonal allergies, uses Proair once per yr.    Mild obstructive sleep apnea    per pt -- mild osa test result--  no rx cpap needed   Neuromuscular disorder (HCC)    Sleep apnea    no cpap   SOB (shortness of breath)    Testosterone deficiency    Urethral stricture    Vocal cord anomaly    pt states told from [place]  doctor heard a little gurgle sound -- no farther testing done    Family History  Problem Relation Age of Onset   Cancer Mother        lung   Lung cancer Mother    Heart disease Maternal Grandmother    Heart disease Maternal Grandfather    Cancer Maternal Grandfather        colon, prostate,lung   Colon cancer Neg Hx    Colon polyps Neg Hx    Esophageal cancer Neg Hx    Rectal cancer Neg Hx    Stomach cancer Neg Hx     Past Surgical History:  Procedure Laterality Date   BACK SURGERY     CARPAL TUNNEL RELEASE Bilateral    Dr Roda Shutters   COLONOSCOPY     CYSTOSCOPY WITH URETHRAL DILATATION N/A 01/07/2013   Procedure: CYSTOSCOPY WITH URETHRAL DILATATION BALLOON DILATION ;  Surgeon: Valetta Fuller, MD;  Location: [facility];  Service: Urology;  Laterality: N/A;   LUMBAR LAMINECTOMY/DECOMPRESSION MICRODISCECTOMY Right 11/29/2012   Procedure: Right Lumbar Four to Five Laminectomy/ Decompression Microdiskectomy;  Surgeon: Clydene Fake, MD;  Location: [facility];  Service: Neurosurgery;  Laterality: Right;  LUMBAR LAMINECTOMY/DECOMPRESSION MICRODISCECTOMY 1 LEVEL   POLYPECTOMY     SHOULDER ARTHROSCOPY W/ ACROMIAL REPAIR Left 08/28/2002   TOTAL KNEE ARTHROPLASTY Left 12/02/2019   TOTAL KNEE ARTHROPLASTY Left 12/02/2019   Procedure: LEFT TOTAL KNEE ARTHROPLASTY;  Surgeon: Tarry Kos, MD;  Location: [facility];  Service: Orthopedics;   Laterality: Left;   TOTAL KNEE ARTHROPLASTY Right 02/20/2023   Procedure: RIGHT TOTAL KNEE ARTHROPLASTY;  Surgeon: Tarry Kos, MD;  Location: [facility];  Service: Orthopedics;  Laterality: Right;   UPPER GASTROINTESTINAL ENDOSCOPY     Social History   Occupational History   Occupation: retired  Tobacco Use   Smoking status: Former    Current packs/day: 0.00    Average packs/day: 0.5 packs/day for 22.0 years (11.0 ttl pk-yrs)    Types: Cigarettes    Start date: 06/27/1966    Quit date: 06/27/1988    Years since quitting: 34.7   Smokeless tobacco: Never  Vaping Use  Vaping status: Never Used  Substance and Sexual Activity   Alcohol use: Not Currently    Comment: cognac (has 2-3 drinks for 2-3 days a week)   Drug use: No   Sexual activity: Yes    Partners: Female

## 2023-03-09 ENCOUNTER — Other Ambulatory Visit (HOSPITAL_BASED_OUTPATIENT_CLINIC_OR_DEPARTMENT_OTHER): Payer: Self-pay

## 2023-03-13 ENCOUNTER — Encounter: Payer: Self-pay | Admitting: Physical Therapy

## 2023-03-13 ENCOUNTER — Other Ambulatory Visit: Payer: Self-pay

## 2023-03-13 ENCOUNTER — Ambulatory Visit (INDEPENDENT_AMBULATORY_CARE_PROVIDER_SITE_OTHER): Payer: TRICARE For Life (TFL) | Admitting: Physical Therapy

## 2023-03-13 DIAGNOSIS — M25561 Pain in right knee: Secondary | ICD-10-CM

## 2023-03-13 DIAGNOSIS — M25661 Stiffness of right knee, not elsewhere classified: Secondary | ICD-10-CM | POA: Diagnosis not present

## 2023-03-13 DIAGNOSIS — M6281 Muscle weakness (generalized): Secondary | ICD-10-CM | POA: Diagnosis not present

## 2023-03-13 DIAGNOSIS — R262 Difficulty in walking, not elsewhere classified: Secondary | ICD-10-CM

## 2023-03-13 DIAGNOSIS — R600 Localized edema: Secondary | ICD-10-CM

## 2023-03-13 NOTE — Therapy (Signed)
OUTPATIENT PHYSICAL THERAPY LOWER EXTREMITY EVALUATION Referring diagnosis? W09.811 Treatment diagnosis? (if different than referring diagnosis) M25.561 What was this (referring dx) caused by? [x]  Surgery []  Fall []  Ongoing issue []  Arthritis []  Other: ____________  Laterality: [x]  Rt []  Lt []  Both  Check all possible CPT codes:  *CHOOSE 10 OR LESS*    []  97110 (Therapeutic Exercise)  []  91478 (SLP Treatment)  []  97112 (Neuro Re-ed)   []  92526 (Swallowing Treatment)   []  97116 (Gait Training)   []  [id number] (Cognitive Training, 1st 15 minutes) []  97140 (Manual Therapy)   []  97130 (Cognitive Training, each add'l 15 minutes)  []  97164 (Re-evaluation)                              []  Other, List CPT Code ____________  []  97530 (Therapeutic Activities)     []  97535 (Self Care)   [x]  All codes above (97110 - 97535)  []  97012 (Mechanical Traction)  []  97014 (E-stim Unattended)  []  97032 (E-stim manual)  []  97033 (Ionto)  []  97035 (Ultrasound) []  97750 (Physical Performance Training) []  [id number] (Aquatic Therapy) [x]  97016 (Vasopneumatic Device) []  [id number] (Paraffin) []  97034 (Contrast Bath) []  97597 (Wound Care 1st 20 sq cm) []  97598 (Wound Care each add'l 20 sq cm) []  97760 (Orthotic Fabrication, Fitting, Training Initial) []  [id number] (Prosthetic Management and Training Initial) []  [phone] (Orthotic or Prosthetic Training/ Modification Subsequent)  Patient Name: Douglas Carpenter MRN: [id number] DOB:June 16, 1956, 67 y.o., male Today's Date: 03/13/2023  END OF SESSION:  PT End of Session - 03/13/23 1405     Visit Number 1    Number of Visits 15    Date for PT Re-Evaluation 05/08/23    Authorization Type tricare, human MCR    Progress Note Due on Visit 10    PT Start Time 1346    PT Stop Time 1430    PT Time Calculation (min) 44 min    Activity Tolerance Patient tolerated treatment well    Behavior During Therapy WFL for tasks assessed/performed             Past Medical History:   Diagnosis Date   Allergy    Arthritis    "everywhere"   BPH (benign prostatic hypertrophy)    Chronic rhinitis 01/27/2009   ED (erectile dysfunction)    GERD (gastroesophageal reflux disease)    Headache(784.0)    migraines, as many as 3 in a week     HYPERLIPIDEMIA 01/27/2009   HYPERTENSION 01/27/2009   Mild asthma    seasonal allergies, uses Proair once per yr.    Mild obstructive sleep apnea    per pt -- mild osa test result--  no rx cpap needed   Neuromuscular disorder (HCC)    Sleep apnea    no cpap   SOB (shortness of breath)    Testosterone deficiency    Urethral stricture    Vocal cord anomaly    pt states told from [place]  doctor heard a little gurgle sound -- no farther testing done   Past Surgical History:  Procedure Laterality Date   BACK SURGERY     CARPAL TUNNEL RELEASE Bilateral    Dr Roda Shutters   COLONOSCOPY     CYSTOSCOPY WITH URETHRAL DILATATION N/A 01/07/2013   Procedure: CYSTOSCOPY WITH URETHRAL DILATATION BALLOON DILATION ;  Surgeon: Valetta Fuller, MD;  Location: [facility];  Service: Urology;  Laterality: N/A;   LUMBAR  LAMINECTOMY/DECOMPRESSION MICRODISCECTOMY Right 11/29/2012   Procedure: Right Lumbar Four to Five Laminectomy/ Decompression Microdiskectomy;  Surgeon: Clydene Fake, MD;  Location: [facility];  Service: Neurosurgery;  Laterality: Right;  LUMBAR LAMINECTOMY/DECOMPRESSION MICRODISCECTOMY 1 LEVEL   POLYPECTOMY     SHOULDER ARTHROSCOPY W/ ACROMIAL REPAIR Left 08/28/2002   TOTAL KNEE ARTHROPLASTY Left 12/02/2019   TOTAL KNEE ARTHROPLASTY Left 12/02/2019   Procedure: LEFT TOTAL KNEE ARTHROPLASTY;  Surgeon: Tarry Kos, MD;  Location: [facility];  Service: Orthopedics;  Laterality: Left;   TOTAL KNEE ARTHROPLASTY Right 02/20/2023   Procedure: RIGHT TOTAL KNEE ARTHROPLASTY;  Surgeon: Tarry Kos, MD;  Location: [facility];  Service: Orthopedics;  Laterality: Right;   UPPER GASTROINTESTINAL ENDOSCOPY     Patient Active Problem List    Diagnosis Date Noted   Status post total right knee replacement 02/20/2023   Pain in left ankle and joints of left foot 07/20/2022   Primary osteoarthritis of first carpometacarpal joint of right hand 07/20/2022   Right carpal tunnel syndrome 11/11/2021   Acute asthmatic bronchitis 06/14/2021   Other headache syndrome 05/17/2021   Body aches 05/17/2021   Acute cough 05/17/2021   Shortness of breath 05/17/2021   Influenza A 05/17/2021   Arthritis of carpometacarpal (CMC) joint of right thumb 04/05/2021   Bilateral hand numbness 04/05/2021   Type II diabetes mellitus with manifestations (HCC) 03/18/2021   Diuretic-induced hypokalemia 10/22/2020   Encounter for general adult medical examination with abnormal findings 08/05/2020   Rising PSA level 08/04/2020   Stage 3a chronic kidney disease (HCC) 08/04/2020   Prediabetes 08/03/2020   Primary hypertension 08/03/2020   Benign prostatic hyperplasia without lower urinary tract symptoms 08/03/2020   Hyperlipidemia with target LDL less than 130 08/03/2020   Primary osteoarthritis of left knee 04/16/2019   Primary osteoarthritis of right knee 02/20/2019   Exercise-induced asthma 07/21/2014   Chronic cough 07/21/2014   Upper airway cough syndrome 07/21/2014   Allergic rhinitis 07/21/2014   Lumbar stenosis 11/06/2013   Urethral stricture 01/07/2013   VOCAL CORD DISORDER 02/19/2009   Asthma 01/27/2009   GERD 01/27/2009    PCP: Corliss Blacker, MD   REFERRING PROVIDER: Cristie Hem, PA-C Ref Provider  REFERRING DIAG:  Diagnosis  [phone] (ICD-10-CM) - Status post total right knee replacement    THERAPY DIAG:  Acute pain of right knee  Stiffness of right knee, not elsewhere classified  Difficulty in walking, not elsewhere classified  Muscle weakness (generalized)  Localized edema  Rationale for Evaluation and Treatment: Rehabilitation  ONSET DATE: Rt TKA 02/20/23  SUBJECTIVE:   SUBJECTIVE STATEMENT: He is  having some pain after surgery, his pain is more in his shin and calf than anything. He has been walking a lot and maybe walking too much by his report. He had HHPT that finished up last week.   PERTINENT HISTORY:02/20/2023 for R TKA. PMH includes back surgery, asthma, GERD, ureteral stricture, HTN, BPH, HLD, CKD III, LT TKA 2021  PAIN:  Are you having pain? Yes: NPRS scale: 6/10 Pain location: left lower leg and sides of knees Pain description: ache, throb Aggravating factors: activity Relieving factors: ice, meds  PRECAUTIONS: None  RED FLAGS: None   WEIGHT BEARING RESTRICTIONS: No  FALLS:  Has patient fallen in last 6 months? No  PLOF: Independent  PATIENT GOALS: reduce pain, and get back to normal   OBJECTIVE:   DIAGNOSTIC FINDINGS: pt relays [place] was negative for blood clot   PATIENT SURVEYS:  Eval:  FOTO 35% functional, goal is 53%  COGNITION: Overall cognitive status: Within functional limits for tasks assessed     EDEMA:  Eval: Moderate edema in Rt knee  MUSCLE LENGTH: Eval: gastroc and Hamstrings: Right are very tight    LOWER EXTREMITY ROM:  Active ROM/PROM Right eval   Hip flexion    Hip extension    Hip abduction    Hip adduction    Hip internal rotation    Hip external rotation    Knee flexion A:100 P:110   Knee extension A:15 P:10   Ankle dorsiflexion    Ankle plantarflexion    Ankle inversion    Ankle eversion     (Blank rows = not tested)  LOWER EXTREMITY MMT:  MMT Right Tested in sitting eval   Hip flexion 4   Hip extension    Hip abduction 5   Hip adduction    Hip internal rotation    Hip external rotation    Knee flexion 4   Knee extension 4   Ankle dorsiflexion    Ankle plantarflexion    Ankle inversion    Ankle eversion     (Blank rows = not tested)   FUNCTIONAL TESTS:  Eval:Sit to stand: can stand without hands but less weight shift to Rt LE  GAIT: Eval Comments: uses SPC currently for ambulation, slower  velocity. PLOF is no AD for gait   TODAY'S TREATMENT:  Eval HEP creation and review with demonstration and trial set preformed, see below for details -Vasopnuematic device X 10 min, medium compression, 34 deg to Rt knee     PATIENT EDUCATION: Education details: HEP, PT plan of care Person educated: Patient Education method: Explanation, Demonstration, Verbal cues, and Handouts Education comprehension: verbalized understanding and needs further education   HOME EXERCISE PROGRAM: Access Code: [email or web address] URL: [email or web address] Date: 03/13/2023 Prepared by: Ivery Quale  Exercises - Seated Hamstring Stretch  - 2 x daily - 6 x weekly - 1 sets - 3 reps - 30 hold - Seated Quad Set  - 2 x daily - 6 x weekly - 1 sets - 15 reps - 5 sec hold - Seated Straight Leg Raise with Quad Contraction  - 2 x daily - 6 x weekly - 2-3 sets - 10 reps - Seated Knee Flexion Stretch  - 2 x daily - 6 x weekly - 1-2 sets - 10 reps - 5 sec hold - Seated Knee Extension Stretch with Chair  - 2 x daily - 6 x weekly - 2-3 sets - 2-3 min hold - Sit to Stand  - 2 x daily - 6 x weekly - 1-2 sets - 10 reps - Standing Gastroc Stretch at [employer] (Mirrored)  - 2 x daily - 6 x weekly - 1 sets - 3 reps - 30 hold  ASSESSMENT:  CLINICAL IMPRESSION: Patient referred to PT for S/P Rt TKA 02/20/23. He is doing quite well up to this point but will need some work for knee extension ROM and quad strengthening. Patient will benefit from skilled PT to address below impairments, limitations and improve overall function.  OBJECTIVE IMPAIRMENTS: decreased activity tolerance, difficulty walking, decreased balance, decreased endurance, decreased mobility, decreased ROM, decreased strength, impaired flexibility, impaired LE use, and pain.  ACTIVITY LIMITATIONS: bending, lifting, carry, locomotion, cleaning, community activity, driving  PERSONAL FACTORS: see above PMH are also affecting patient's functional  outcome.  REHAB POTENTIAL: Good  CLINICAL DECISION MAKING: Stable/uncomplicated  EVALUATION COMPLEXITY: Low    GOALS: Short term  PT Goals Target date: 04/10/2023   Pt will be I and compliant with HEP. Baseline:  Goal status: New Pt will decrease pain by 25% overall Baseline:6/10 Goal status: New  Long term PT goals Target date:05/08/2023   Pt will improve Rt knee PROM 3-120 deg to improve functional mobility Baseline: Goal status: New Pt will improve  Rt hip/knee strength to at least 5-/5 MMT to improve functional strength Baseline: Goal status: New Pt will improve FOTO to at least 53% functional to show improved function Baseline: Goal status: New  Pt will reduce pain to overall less than 2-3/10 with usual activity and work activity. Baseline: Goal status: New Pt will be able to ambulate community distances at least 1000 ft WNL gait pattern without complaints Baseline: Goal status: New  PLAN: PT FREQUENCY: 1-3 times per week   PT DURATION: 4-8 weeks  PLANNED INTERVENTIONS (unless contraindicated): aquatic PT, Canalith repositioning, cryotherapy, Electrical stimulation, Iontophoresis with 4 mg/ml dexamethasome, Moist heat, traction, Ultrasound, gait training, Therapeutic exercise, balance training, neuromuscular re-education, patient/family education, prosthetic training, manual techniques, passive ROM, dry needling, taping, vasopnuematic device, vestibular, spinal manipulations, joint manipulations  PLAN FOR NEXT SESSION: extension ROM focus and quad strengthening to tolerance NEXT MD VISIT: 04/04/23   April Manson, PT,DPT 03/13/2023, 2:10 PM

## 2023-03-15 ENCOUNTER — Telehealth: Payer: Self-pay | Admitting: Orthopaedic Surgery

## 2023-03-15 ENCOUNTER — Encounter (HOSPITAL_BASED_OUTPATIENT_CLINIC_OR_DEPARTMENT_OTHER): Payer: Self-pay | Admitting: Pulmonary Disease

## 2023-03-15 ENCOUNTER — Other Ambulatory Visit: Payer: Self-pay | Admitting: Physician Assistant

## 2023-03-15 ENCOUNTER — Ambulatory Visit (INDEPENDENT_AMBULATORY_CARE_PROVIDER_SITE_OTHER): Payer: Medicare PPO | Admitting: Pulmonary Disease

## 2023-03-15 VITALS — BP 110/70 | HR 80 | Ht 67.0 in | Wt 193.0 lb

## 2023-03-15 DIAGNOSIS — J4540 Moderate persistent asthma, uncomplicated: Secondary | ICD-10-CM

## 2023-03-15 DIAGNOSIS — Z23 Encounter for immunization: Secondary | ICD-10-CM | POA: Diagnosis not present

## 2023-03-15 MED ORDER — HYDROCODONE-ACETAMINOPHEN 7.5-325 MG PO TABS: 1.0000 | ORAL_TABLET | Freq: Three times a day (TID) | ORAL | 0 refills | Status: DC | PRN

## 2023-03-15 NOTE — Telephone Encounter (Signed)
Patient called needing a refill on hydrocodone. CB#[phone]

## 2023-03-15 NOTE — Progress Notes (Signed)
[facility]  Chief Complaint  Patient presents with   Medical Management of Chronic Issues   Constitutional:  BP 110/70   Pulse 80   Ht 5\' 7"  (1.702 m)   Wt 193 lb (87.5 kg)   SpO2 98%   BMI 30.23 kg/m   Past Medical History:  Low T, HTN, HLD, HA, GERD, BPH, OA, COVID 19 infection January 2022  Past Surgical History:  He  has a past surgical history that includes Lumbar laminectomy/decompression microdiscectomy (Right, 11/29/2012); Shoulder arthroscopy w/ acromial repair (Left, 08/28/2002); Cystoscopy with urethral dilatation (N/A, 01/07/2013); Colonoscopy; Polypectomy; Upper gastrointestinal endoscopy; Back surgery; Total knee arthroplasty (Left, 12/02/2019); Total knee arthroplasty (Left, 12/02/2019); Carpal tunnel release (Bilateral); and Total knee arthroplasty (Right, 02/20/2023).  Brief Summary:  Douglas Carpenter is a 67 y.o. male former smoker with chronic cough in setting of allergic asthma, Post nasal drip, GERD, and vocal cord disease.  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      Subjective:   He has been able to maintain weight loss with diet change.  Had right knee surgery recently.  Still working with PT.  Breathing good.  Not having cough, wheeze, or sputum.  Sleeping okay.  Physical Exam:   Appearance - well kempt   ENMT - no sinus tenderness, no oral exudate, no LAN, Mallampati 3 airway, no stridor  Respiratory - equal breath sounds bilaterally, no wheezing or rales  CV - s1s2 regular rate and rhythm, no murmurs  Ext - no clubbing, no edema  Skin - no rashes  Psych - normal mood and affect      Pulmonary testing:  PFT 02/19/09 >> FEV1 2.90 (90%), FEV1% 70, TLC 5.62(93%), DLCO 99%, +BD PFT 07/21/14 >> FEV1 2.70 (93%), FEV1% 74, TLC 5.97 (91%), RV 2.65 (126%), DLCO 106%, +BD RAST 07/21/14 >> react to dust mites, grasses; IgE 49 FeNO 10/27/15 >> 228 IgE 12/13/22 >> 76 CBC 12/13/22 >> Eos  100  Sleep Tests:  HST 03/07/13 >> AHI 3.9, SaO2 low 80%  Cardiac Tests:  Echo 12/14/20 >> EF 55 to 60%  Social History:  He  reports that he quit smoking about 34 years ago. His smoking use included cigarettes. He started smoking about 56 years ago. He has a 11 pack-year smoking history. He has never used smokeless tobacco. He reports that he does not currently use alcohol. He reports that he does not use drugs.  Family History:  His family history includes Cancer in his maternal grandfather and mother; Heart disease in his maternal grandfather and maternal grandmother; Lung cancer in his mother.     Assessment/Plan:   Allergic asthma with exercise induced bronchoconstriction. - he gets scripts from the [place] - he has frequent exacerbations, and these happen typical with onset of Fall and Spring - continue wixela, singulair - prn albuterol - high dose flu shot today  Upper airway cough syndrome. - continue singulair with prn claritin   Allergic conjunctivitis. - prn pataday   Time Spent Involved in Patient Care on Day of Examination:  26 minutes  Follow up:   Patient Instructions  High dose flu shot today  Follow up in 5 months  Medication List:   Allergies as of 03/15/2023       Reactions   Aspirin Rash   Pt can tolerate low doses-- INTOLERANT TO HIGHER DOSES        Medication List  Accurate as of March 15, 2023  9:13 AM. If you have any questions, ask your nurse or doctor.          Aimovig 70 MG/ML Soaj Generic drug: Erenumab-aooe Inject 70 mg into the skin every 30 (thirty) days.   albuterol 108 (90 Base) MCG/ACT inhaler Commonly known as: ProAir HFA Inhale 2 puffs into the lungs every 6 (six) hours as needed for wheezing or shortness of breath.   albuterol (2.5 MG/3ML) 0.083% nebulizer solution Commonly known as: PROVENTIL Take 3 mLs (2.5 mg total) by nebulization every 6 (six) hours as needed for wheezing or shortness of breath.    aspirin EC 81 MG tablet Take 1 tablet (81 mg total) by mouth 2 (two) times daily. To be taken after surgery to prevent blood clots   atorvastatin 80 MG tablet Commonly known as: LIPITOR Take 1 tablet (80 mg total) by mouth at bedtime.   benzonatate 100 MG capsule Commonly known as: TESSALON Take 100 mg by mouth 2 (two) times daily.   carvedilol 12.5 MG tablet Commonly known as: COREG Take 1 tablet (12.5 mg total) by mouth 2 (two) times daily with a meal. What changed: how much to take   docusate sodium 100 MG capsule Commonly known as: Colace Take 1 capsule (100 mg total) by mouth daily as needed.   fluticasone-salmeterol 250-50 MCG/ACT Aepb Commonly known as: Wixela Inhub Inhale 1 puff into the lungs in the morning and at bedtime.   gabapentin 300 MG capsule Commonly known as: NEURONTIN Take 300 mg by mouth 3 (three) times daily.   HYDROcodone-acetaminophen 7.5-325 MG tablet Commonly known as: Norco Take 1-2 tablets by mouth 3 (three) times daily as needed for moderate pain.   indapamide 2.5 MG tablet Commonly known as: LOZOL Take 1 tablet (2.5 mg total) by mouth daily.   methocarbamol 750 MG tablet Commonly known as: Robaxin-750 Take 1 tablet (750 mg total) by mouth 2 (two) times daily as needed for muscle spasms.   methocarbamol 750 MG tablet Commonly known as: Robaxin-750 Take 1 tablet (750 mg total) by mouth 3 (three) times daily as needed for muscle spasms.   montelukast 10 MG tablet Commonly known as: SINGULAIR Take 1 tablet (10 mg total) by mouth at bedtime.   omeprazole 40 MG capsule Commonly known as: PRILOSEC Take 40 mg by mouth daily.   ondansetron 4 MG tablet Commonly known as: Zofran Take 1 tablet (4 mg total) by mouth every 8 (eight) hours as needed for nausea or vomiting.   oxyCODONE-acetaminophen 5-325 MG tablet Commonly known as: Percocet Take 1-2 tablets by mouth every 4 (four) hours as needed.   potassium chloride SA 20 MEQ  tablet Commonly known as: Klor-Con M20 Take 1 tablet (20 mEq total) by mouth 2 (two) times daily.   terbinafine 250 MG tablet Commonly known as: LamISIL Take 1 tablet (250 mg total) by mouth daily.        Signature:  Coralyn Helling, MD [facility] Pager - [phone] 03/15/2023, 9:13 AM

## 2023-03-15 NOTE — Addendum Note (Signed)
Addended by: Judd Gaudier on: 03/15/2023 09:24 AM   Modules accepted: Orders

## 2023-03-15 NOTE — Telephone Encounter (Signed)
Pt spouse called due to hydrocodone needing to be sent to [address] please. Pt spouse advised to give her a call please. Sampson Goon [phone]

## 2023-03-15 NOTE — Telephone Encounter (Signed)
sent 

## 2023-03-15 NOTE — Telephone Encounter (Signed)
Tried to call. No answer.

## 2023-03-15 NOTE — Patient Instructions (Signed)
High dose flu shot today  Follow up in 5 months

## 2023-03-16 ENCOUNTER — Other Ambulatory Visit: Payer: Self-pay | Admitting: Physician Assistant

## 2023-03-16 ENCOUNTER — Telehealth: Payer: Self-pay | Admitting: Orthopaedic Surgery

## 2023-03-16 MED ORDER — HYDROCODONE-ACETAMINOPHEN 7.5-325 MG PO TABS: 1.0000 | ORAL_TABLET | Freq: Three times a day (TID) | ORAL | 0 refills | Status: DC | PRN

## 2023-03-16 NOTE — Telephone Encounter (Signed)
Called pharmacy and they said they would go ahead and fill. Tried both numbers for patient and wife, no answer.

## 2023-03-16 NOTE — Telephone Encounter (Signed)
Pt spouse called due to pharmacy saying they aren't able to refill his hydrocodone it's too soon. Pt is in need of pain medication. Pt spouse Shanta Kneece advised to give her a call back please. [phone]

## 2023-03-16 NOTE — Telephone Encounter (Signed)
sent 

## 2023-03-16 NOTE — Telephone Encounter (Signed)
Can you tell pharmacy it is ok to fill early

## 2023-03-17 ENCOUNTER — Ambulatory Visit (INDEPENDENT_AMBULATORY_CARE_PROVIDER_SITE_OTHER): Payer: TRICARE For Life (TFL) | Admitting: Physical Therapy

## 2023-03-17 ENCOUNTER — Encounter: Payer: Self-pay | Admitting: Physical Therapy

## 2023-03-17 DIAGNOSIS — R262 Difficulty in walking, not elsewhere classified: Secondary | ICD-10-CM

## 2023-03-17 DIAGNOSIS — M25561 Pain in right knee: Secondary | ICD-10-CM | POA: Diagnosis not present

## 2023-03-17 DIAGNOSIS — M6281 Muscle weakness (generalized): Secondary | ICD-10-CM | POA: Diagnosis not present

## 2023-03-17 DIAGNOSIS — M25661 Stiffness of right knee, not elsewhere classified: Secondary | ICD-10-CM | POA: Diagnosis not present

## 2023-03-17 NOTE — Therapy (Signed)
OUTPATIENT PHYSICAL THERAPY LOWER EXTREMITY TREATMENT  Referring diagnosis? R48.546 Treatment diagnosis? (if different than referring diagnosis) M25.561 What was this (referring dx) caused by? [x]  Surgery []  Fall []  Ongoing issue []  Arthritis []  Other: ____________  Laterality: [x]  Rt []  Lt []  Both  Check all possible CPT codes:  *CHOOSE 10 OR LESS*    []  27035 (Therapeutic Exercise)  []  92507 (SLP Treatment)  []  00938 (Neuro Re-ed)   []  92526 (Swallowing Treatment)   []  97116 (Gait Training)   []  [id number] (Cognitive Training, 1st 15 minutes) []  97140 (Manual Therapy)   []  97130 (Cognitive Training, each add'l 15 minutes)  []  97164 (Re-evaluation)                              []  Other, List CPT Code ____________  []  97530 (Therapeutic Activities)     []  97535 (Self Care)   [x]  All codes above (97110 - 97535)  []  97012 (Mechanical Traction)  []  97014 (E-stim Unattended)  []  97032 (E-stim manual)  []  97033 (Ionto)  []  97035 (Ultrasound) []  97750 (Physical Performance Training) []  [id number] (Aquatic Therapy) [x]  97016 (Vasopneumatic Device) []  [id number] (Paraffin) []  97034 (Contrast Bath) []  97597 (Wound Care 1st 20 sq cm) []  97598 (Wound Care each add'l 20 sq cm) []  97760 (Orthotic Fabrication, Fitting, Training Initial) []  [id number] (Prosthetic Management and Training Initial) []  [id number] (Orthotic or Prosthetic Training/ Modification Subsequent)  Patient Name: DARIC PASLAY MRN: [id number] DOB:1956-06-27, 67 y.o., male Today's Date: 03/17/2023  END OF SESSION:  PT End of Session - 03/17/23 0932     Visit Number 2    Number of Visits 15    Date for PT Re-Evaluation 05/08/23    Authorization Type tric[employer], human MCR    Progress Note Due on Visit 10    PT Start Time 0931    PT Stop Time 1011    PT Time Calculation (min) 40 min    Activity Tolerance Patient tolerated treatment well    Behavior During Therapy WFL for tasks assessed/performed              Past Medical  History:  Diagnosis Date   Allergy    Arthritis    "everywhere"   BPH (benign prostatic hypertrophy)    Chronic rhinitis 01/27/2009   ED (erectile dysfunction)    GERD (gastroesophageal reflux disease)    Headache(784.0)    migraines, as many as 3 in a week     HYPERLIPIDEMIA 01/27/2009   HYPERTENSION 01/27/2009   Mild asthma    seasonal allergies, uses Proair once per yr.    Mild obstructive sleep apnea    per pt -- mild osa test result--  no rx cpap needed   Neuromuscular disorder (HCC)    Sleep apnea    no cpap   SOB (shortness of breath)    Testosterone deficiency    Urethral stricture    Vocal cord anomaly    pt states told from [place]  doctor heard a little gurgle sound -- no farther testing done   Past Surgical History:  Procedure Laterality Date   BACK SURGERY     CARPAL TUNNEL RELEASE Bilateral    Dr Roda Shutters   COLONOSCOPY     CYSTOSCOPY WITH URETHRAL DILATATION N/A 01/07/2013   Procedure: CYSTOSCOPY WITH URETHRAL DILATATION BALLOON DILATION ;  Surgeon: Valetta Fuller, MD;  Location: [facility];  Service: Urology;  Laterality: N/A;  LUMBAR LAMINECTOMY/DECOMPRESSION MICRODISCECTOMY Right 11/29/2012   Procedure: Right Lumbar Four to Five Laminectomy/ Decompression Microdiskectomy;  Surgeon: Clydene Fake, MD;  Location: [facility];  Service: Neurosurgery;  Laterality: Right;  LUMBAR LAMINECTOMY/DECOMPRESSION MICRODISCECTOMY 1 LEVEL   POLYPECTOMY     SHOULDER ARTHROSCOPY W/ ACROMIAL REPAIR Left 08/28/2002   TOTAL KNEE ARTHROPLASTY Left 12/02/2019   TOTAL KNEE ARTHROPLASTY Left 12/02/2019   Procedure: LEFT TOTAL KNEE ARTHROPLASTY;  Surgeon: Tarry Kos, MD;  Location: [facility];  Service: Orthopedics;  Laterality: Left;   TOTAL KNEE ARTHROPLASTY Right 02/20/2023   Procedure: RIGHT TOTAL KNEE ARTHROPLASTY;  Surgeon: Tarry Kos, MD;  Location: [facility];  Service: Orthopedics;  Laterality: Right;   UPPER GASTROINTESTINAL ENDOSCOPY     Patient Active Problem List    Diagnosis Date Noted   Status post total right knee replacement 02/20/2023   Pain in left ankle and joints of left foot 07/20/2022   Primary osteoarthritis of first carpometacarpal joint of right hand 07/20/2022   Right carpal tunnel syndrome 11/11/2021   Acute asthmatic bronchitis 06/14/2021   Other headache syndrome 05/17/2021   Body aches 05/17/2021   Acute cough 05/17/2021   Shortness of breath 05/17/2021   Influenza A 05/17/2021   Arthritis of carpometacarpal (CMC) joint of right thumb 04/05/2021   Bilateral hand numbness 04/05/2021   Type II diabetes mellitus with manifestations (HCC) 03/18/2021   Diuretic-induced hypokalemia 10/22/2020   Encounter for general adult medical examination with abnormal findings 08/05/2020   Rising PSA level 08/04/2020   Stage 3a chronic kidney disease (HCC) 08/04/2020   Prediabetes 08/03/2020   Primary hypertension 08/03/2020   Benign prostatic hyperplasia without lower urinary tract symptoms 08/03/2020   Hyperlipidemia with target LDL less than 130 08/03/2020   Primary osteoarthritis of left knee 04/16/2019   Primary osteoarthritis of right knee 02/20/2019   Exercise-induced asthma 07/21/2014   Chronic cough 07/21/2014   Upper airway cough syndrome 07/21/2014   Allergic rhinitis 07/21/2014   Lumbar stenosis 11/06/2013   Urethral stricture 01/07/2013   VOCAL CORD DISORDER 02/19/2009   Asthma 01/27/2009   GERD 01/27/2009    PCP: Corliss Blacker, MD   REFERRING PROVIDER: Cristie Hem, PA-C Ref Provider  REFERRING DIAG:  Diagnosis  [phone] (ICD-10-CM) - Status post total right knee replacement    THERAPY DIAG:  Acute pain of right knee  Difficulty in walking, not elsewhere classified  Muscle weakness (generalized)  Stiffness of right knee, not elsewhere classified  Rationale for Evaluation and Treatment: Rehabilitation  ONSET DATE: Rt TKA 02/20/23  SUBJECTIVE:   SUBJECTIVE STATEMENT:  Knee feels OK, its  everything below it that still really hurts me. They did check for DVT and infection but everything was OK. It keeps me up all night honestly. My hip started hurting me too I think bc I'm limping so much, had a back injection the day before yesterday it hasn't quite kicked in yet, it usually takes a couple days    PERTINENT HISTORY:02/20/2023 for R TKA. PMH includes back surgery, asthma, GERD, ureteral stricture, HTN, BPH, HLD, CKD III, LT TKA 2021  PAIN:  Are you having pain? Yes: NPRS scale: 6-7/10 Pain location: R LE in shin/calf/ankle  Pain description: ache, throbbing and sore  Aggravating factors: activity, touching it  Relieving factors: ice, meds  PRECAUTIONS: None  RED FLAGS: None   WEIGHT BEARING RESTRICTIONS: No  FALLS:  Has patient fallen in last 6 months? No  PLOF: Independent  PATIENT GOALS: reduce  pain, and get back to normal   OBJECTIVE:   DIAGNOSTIC FINDINGS: pt relays [place] was negative for blood clot   PATIENT SURVEYS:  Eval: FOTO 35% functional, goal is 53%  COGNITION: Overall cognitive status: Within functional limits for tasks assessed     EDEMA:  Eval: Moderate edema in Rt knee  MUSCLE LENGTH: Eval: gastroc and Hamstrings: Right are very tight    LOWER EXTREMITY ROM:  Active ROM/PROM Right eval   Hip flexion    Hip extension    Hip abduction    Hip adduction    Hip internal rotation    Hip external rotation    Knee flexion A:100 P:110   Knee extension A:15 P:10   Ankle dorsiflexion    Ankle plantarflexion    Ankle inversion    Ankle eversion     (Blank rows = not tested)  LOWER EXTREMITY MMT:  MMT Right Tested in sitting eval   Hip flexion 4   Hip extension    Hip abduction 5   Hip adduction    Hip internal rotation    Hip external rotation    Knee flexion 4   Knee extension 4   Ankle dorsiflexion    Ankle plantarflexion    Ankle inversion    Ankle eversion     (Blank rows = not tested)   FUNCTIONAL TESTS:   Eval:Sit to stand: can stand without hands but less weight shift to Rt LE  GAIT: Eval Comments: uses SPC currently for ambulation, slower velocity. PLOF is no AD for gait   TODAY'S TREATMENT:   03/17/23  TherEX  Nustep L1 x8 minutes with holds into flexion and extension stretches R knee, seat progressions as able/tolerated Seated knee flexion AAROM 12x5 second holds  Knee extension stretches 3# on knee and heel prop x3 minutes HS stretches 3x30 seconds R LE Supine SAQs 3# 12x3 second holds LAQs 3# 12x3 second holds Gastroc stretches R LE 3x30 seconds with strap  Shuttle: BLEs 50# x12, R LE only 25# x12     Manual  Knee extension OP supine to tolerance       Eval HEP creation and review with demonstration and trial set preformed, see below for details -Vasopnuematic device X 10 min, medium compression, 34 deg to Rt knee     PATIENT EDUCATION: Education details: HEP, PT plan of care Person educated: Patient Education method: Explanation, Demonstration, Verbal cues, and Handouts Education comprehension: verbalized understanding and needs further education   HOME EXERCISE PROGRAM: Access Code: [email or web address] URL: [email or web address] Date: 03/13/2023 Prepared by: Ivery Quale  Exercises - Seated Hamstring Stretch  - 2 x daily - 6 x weekly - 1 sets - 3 reps - 30 hold - Seated Quad Set  - 2 x daily - 6 x weekly - 1 sets - 15 reps - 5 sec hold - Seated Straight Leg Raise with Quad Contraction  - 2 x daily - 6 x weekly - 2-3 sets - 10 reps - Seated Knee Flexion Stretch  - 2 x daily - 6 x weekly - 1-2 sets - 10 reps - 5 sec hold - Seated Knee Extension Stretch with Chair  - 2 x daily - 6 x weekly - 2-3 sets - 2-3 min hold - Sit to Stand  - 2 x daily - 6 x weekly - 1-2 sets - 10 reps - Standing Gastroc Stretch at [employer] (Mirrored)  - 2 x daily - 6 x weekly - 1 sets - 3 reps -  30 hold  ASSESSMENT:  CLINICAL IMPRESSION:   Pt arrives today doing well,  still having a lot of pain in R shin/calf/ankle, thankfully doppler and test for possible infection were clear of acute issues. Worked on ROM and strengthening as appropriate today. Doing well, continued to encourage regular icing routine at home.    OBJECTIVE IMPAIRMENTS: decreased activity tolerance, difficulty walking, decreased balance, decreased endurance, decreased mobility, decreased ROM, decreased strength, impaired flexibility, impaired LE use, and pain.  ACTIVITY LIMITATIONS: bending, lifting, carry, locomotion, cleaning, community activity, driving  PERSONAL FACTORS: see above PMH are also affecting patient's functional outcome.  REHAB POTENTIAL: Good  CLINICAL DECISION MAKING: Stable/uncomplicated  EVALUATION COMPLEXITY: Low    GOALS: Short term PT Goals Target date: 04/10/2023   Pt will be I and compliant with HEP. Baseline:  Goal status: New Pt will decrease pain by 25% overall Baseline:6/10 Goal status: New  Long term PT goals Target date:05/08/2023   Pt will improve Rt knee PROM 3-120 deg to improve functional mobility Baseline: Goal status: New Pt will improve  Rt hip/knee strength to at least 5-/5 MMT to improve functional strength Baseline: Goal status: New Pt will improve FOTO to at least 53% functional to show improved function Baseline: Goal status: New  Pt will reduce pain to overall less than 2-3/10 with usual activity and work activity. Baseline: Goal status: New Pt will be able to ambulate community distances at least 1000 ft WNL gait pattern without complaints Baseline: Goal status: New  PLAN: PT FREQUENCY: 1-3 times per week   PT DURATION: 4-8 weeks  PLANNED INTERVENTIONS (unless contraindicated): aquatic PT, Canalith repositioning, cryotherapy, Electrical stimulation, Iontophoresis with 4 mg/ml dexamethasome, Moist heat, traction, Ultrasound, gait training, Therapeutic exercise, balance training, neuromuscular re-education,  patient/family education, prosthetic training, manual techniques, passive ROM, dry needling, taping, vasopnuematic device, vestibular, spinal manipulations, joint manipulations  PLAN FOR NEXT SESSION: extension ROM focus and quad strengthening to tolerance NEXT MD VISIT: 04/04/23   Nedra Hai, PT, DPT 03/17/23 10:12 AM

## 2023-03-20 ENCOUNTER — Ambulatory Visit (INDEPENDENT_AMBULATORY_CARE_PROVIDER_SITE_OTHER): Payer: Medicare PPO | Admitting: Physical Therapy

## 2023-03-20 ENCOUNTER — Encounter: Payer: Self-pay | Admitting: Physical Therapy

## 2023-03-20 DIAGNOSIS — M6281 Muscle weakness (generalized): Secondary | ICD-10-CM | POA: Diagnosis not present

## 2023-03-20 DIAGNOSIS — M25561 Pain in right knee: Secondary | ICD-10-CM | POA: Diagnosis not present

## 2023-03-20 DIAGNOSIS — R600 Localized edema: Secondary | ICD-10-CM

## 2023-03-20 DIAGNOSIS — M25661 Stiffness of right knee, not elsewhere classified: Secondary | ICD-10-CM | POA: Diagnosis not present

## 2023-03-20 DIAGNOSIS — R262 Difficulty in walking, not elsewhere classified: Secondary | ICD-10-CM

## 2023-03-20 NOTE — Therapy (Signed)
OUTPATIENT PHYSICAL THERAPY LOWER EXTREMITY TREATMENT  Referring diagnosis? U98.119 Treatment diagnosis? (if different than referring diagnosis) M25.561 What was this (referring dx) caused by? [x]  Surgery []  Fall []  Ongoing issue []  Arthritis []  Other: ____________  Laterality: [x]  Rt []  Lt []  Both  Check all possible CPT codes:  *CHOOSE 10 OR LESS*    []  97110 (Therapeutic Exercise)  []  92507 (SLP Treatment)  []  97112 (Neuro Re-ed)   []  92526 (Swallowing Treatment)   []  97116 (Gait Training)   []  [id number] (Cognitive Training, 1st 15 minutes) []  97140 (Manual Therapy)   []  97130 (Cognitive Training, each add'l 15 minutes)  []  97164 (Re-evaluation)                              []  Other, List CPT Code ____________  []  97530 (Therapeutic Activities)     []  97535 (Self Care)   [x]  All codes above (97110 - 97535)  []  97012 (Mechanical Traction)  []  97014 (E-stim Unattended)  []  97032 (E-stim manual)  []  97033 (Ionto)  []  97035 (Ultrasound) []  97750 (Physical Performance Training) []  [id number] (Aquatic Therapy) [x]  97016 (Vasopneumatic Device) []  [id number] (Paraffin) []  97034 (Contrast Bath) []  97597 (Wound Care 1st 20 sq cm) []  97598 (Wound Care each add'l 20 sq cm) []  97760 (Orthotic Fabrication, Fitting, Training Initial) []  [id number] (Prosthetic Management and Training Initial) []  [phone] (Orthotic or Prosthetic Training/ Modification Subsequent)  Patient Name: EDERSON SHIAO MRN: [id number] DOB:1955-12-07, 67 y.o., male Today's Date: 03/20/2023  END OF SESSION:  PT End of Session - 03/20/23 1003     Visit Number 3    Number of Visits 15    Date for PT Re-Evaluation 05/08/23    Authorization Type tricare, human MCR    Progress Note Due on Visit 10    PT Start Time 1005    PT Stop Time 1045    PT Time Calculation (min) 40 min    Activity Tolerance Patient tolerated treatment well    Behavior During Therapy WFL for tasks assessed/performed              Past Medical  History:  Diagnosis Date   Allergy    Arthritis    "everywhere"   BPH (benign prostatic hypertrophy)    Chronic rhinitis 01/27/2009   ED (erectile dysfunction)    GERD (gastroesophageal reflux disease)    Headache(784.0)    migraines, as many as 3 in a week     HYPERLIPIDEMIA 01/27/2009   HYPERTENSION 01/27/2009   Mild asthma    seasonal allergies, uses Proair once per yr.    Mild obstructive sleep apnea    per pt -- mild osa test result--  no rx cpap needed   Neuromuscular disorder (HCC)    Sleep apnea    no cpap   SOB (shortness of breath)    Testosterone deficiency    Urethral stricture    Vocal cord anomaly    pt states told from [place]  doctor heard a little gurgle sound -- no farther testing done   Past Surgical History:  Procedure Laterality Date   BACK SURGERY     CARPAL TUNNEL RELEASE Bilateral    Dr Roda Shutters   COLONOSCOPY     CYSTOSCOPY WITH URETHRAL DILATATION N/A 01/07/2013   Procedure: CYSTOSCOPY WITH URETHRAL DILATATION BALLOON DILATION ;  Surgeon: Valetta Fuller, MD;  Location: [facility];  Service: Urology;  Laterality: N/A;  LUMBAR LAMINECTOMY/DECOMPRESSION MICRODISCECTOMY Right 11/29/2012   Procedure: Right Lumbar Four to Five Laminectomy/ Decompression Microdiskectomy;  Surgeon: Clydene Fake, MD;  Location: [facility];  Service: Neurosurgery;  Laterality: Right;  LUMBAR LAMINECTOMY/DECOMPRESSION MICRODISCECTOMY 1 LEVEL   POLYPECTOMY     SHOULDER ARTHROSCOPY W/ ACROMIAL REPAIR Left 08/28/2002   TOTAL KNEE ARTHROPLASTY Left 12/02/2019   TOTAL KNEE ARTHROPLASTY Left 12/02/2019   Procedure: LEFT TOTAL KNEE ARTHROPLASTY;  Surgeon: Tarry Kos, MD;  Location: [facility];  Service: Orthopedics;  Laterality: Left;   TOTAL KNEE ARTHROPLASTY Right 02/20/2023   Procedure: RIGHT TOTAL KNEE ARTHROPLASTY;  Surgeon: Tarry Kos, MD;  Location: [facility];  Service: Orthopedics;  Laterality: Right;   UPPER GASTROINTESTINAL ENDOSCOPY     Patient Active Problem List    Diagnosis Date Noted   Status post total right knee replacement 02/20/2023   Pain in left ankle and joints of left foot 07/20/2022   Primary osteoarthritis of first carpometacarpal joint of right hand 07/20/2022   Right carpal tunnel syndrome 11/11/2021   Acute asthmatic bronchitis 06/14/2021   Other headache syndrome 05/17/2021   Body aches 05/17/2021   Acute cough 05/17/2021   Shortness of breath 05/17/2021   Influenza A 05/17/2021   Arthritis of carpometacarpal (CMC) joint of right thumb 04/05/2021   Bilateral hand numbness 04/05/2021   Type II diabetes mellitus with manifestations (HCC) 03/18/2021   Diuretic-induced hypokalemia 10/22/2020   Encounter for general adult medical examination with abnormal findings 08/05/2020   Rising PSA level 08/04/2020   Stage 3a chronic kidney disease (HCC) 08/04/2020   Prediabetes 08/03/2020   Primary hypertension 08/03/2020   Benign prostatic hyperplasia without lower urinary tract symptoms 08/03/2020   Hyperlipidemia with target LDL less than 130 08/03/2020   Primary osteoarthritis of left knee 04/16/2019   Primary osteoarthritis of right knee 02/20/2019   Exercise-induced asthma 07/21/2014   Chronic cough 07/21/2014   Upper airway cough syndrome 07/21/2014   Allergic rhinitis 07/21/2014   Lumbar stenosis 11/06/2013   Urethral stricture 01/07/2013   VOCAL CORD DISORDER 02/19/2009   Asthma 01/27/2009   GERD 01/27/2009    PCP: Corliss Blacker, MD   REFERRING PROVIDER: Cristie Hem, PA-C Ref Provider  REFERRING DIAG:  Diagnosis  [phone] (ICD-10-CM) - Status post total right knee replacement    THERAPY DIAG:  Acute pain of right knee  Difficulty in walking, not elsewhere classified  Muscle weakness (generalized)  Stiffness of right knee, not elsewhere classified  Localized edema  Rationale for Evaluation and Treatment: Rehabilitation  ONSET DATE: Rt TKA 02/20/23  SUBJECTIVE:   SUBJECTIVE STATEMENT: Relays  his knee is feeling better overall  PERTINENT HISTORY:02/20/2023 for R TKA. PMH includes back surgery, asthma, GERD, ureteral stricture, HTN, BPH, HLD, CKD III, LT TKA 2021  PAIN:  Are you having pain? Yes: NPRS scale: 3/10 Pain location: R LE in shin/calf/ankle  Pain description: ache, throbbing and sore  Aggravating factors: activity, touching it  Relieving factors: ice, meds  PRECAUTIONS: None  RED FLAGS: None   WEIGHT BEARING RESTRICTIONS: No  FALLS:  Has patient fallen in last 6 months? No  PLOF: Independent  PATIENT GOALS: reduce pain, and get back to normal   OBJECTIVE:   DIAGNOSTIC FINDINGS: pt relays [place] was negative for blood clot   PATIENT SURVEYS:  Eval: FOTO 35% functional, goal is 53%  COGNITION: Overall cognitive status: Within functional limits for tasks assessed     EDEMA:  Eval: Moderate edema in Rt knee  MUSCLE LENGTH: Eval: gastroc and Hamstrings: Right are very tight    LOWER EXTREMITY ROM:  Active ROM/PROM Right eval 03/20/23  Hip flexion    Hip extension    Hip abduction    Hip adduction    Hip internal rotation    Hip external rotation    Knee flexion A:100 P:110 P:112  Knee extension A:15 P:10 A:10 P:6   Ankle dorsiflexion    Ankle plantarflexion    Ankle inversion    Ankle eversion     (Blank rows = not tested)  LOWER EXTREMITY MMT:  MMT Right Tested in sitting eval   Hip flexion 4   Hip extension    Hip abduction 5   Hip adduction    Hip internal rotation    Hip external rotation    Knee flexion 4   Knee extension 4   Ankle dorsiflexion    Ankle plantarflexion    Ankle inversion    Ankle eversion     (Blank rows = not tested)   FUNCTIONAL TESTS:  Eval:Sit to stand: can stand without hands but less weight shift to Rt LE  GAIT: Eval Comments: uses SPC currently for ambulation, slower velocity. PLOF is no AD for gait   TODAY'S TREATMENT:   03/20/23  TherEX  Nustep L4 x8 minutes LE only Gastroc  stretches R LE 3x30 seconds with slantboard Shuttle: BLEs 62# 2 x15, R LE only 31# 2x10  Seated knee flexion AAROM 15x5 second holds  Knee extension stretches 3# on knee and heel prop x3 minutes HS stretches 3x30 seconds R LE LAQs 3# 2 X15 second holds Sit to stands no UE support X 10 Manual Knee extension OP supine to tolerance   03/17/23   TherEX   Nustep L1 x8 minutes with holds into flexion and extension stretches R knee, seat progressions as able/tolerated Seated knee flexion AAROM 12x5 second holds  Knee extension stretches 3# on knee and heel prop x3 minutes HS stretches 3x30 seconds R LE Supine SAQs 3# 12x3 second holds LAQs 3# 12x3 second holds Gastroc stretches R LE 3x30 seconds with strap  Shuttle: BLEs 50# x12, R LE only 25# x12      PATIENT EDUCATION: Education details: HEP, PT plan of care Person educated: Patient Education method: Explanation, Demonstration, Verbal cues, and Handouts Education comprehension: verbalized understanding and needs further education   HOME EXERCISE PROGRAM: Access Code: [email or web address] URL: [email or web address] Date: 03/13/2023 Prepared by: Ivery Quale  Exercises - Seated Hamstring Stretch  - 2 x daily - 6 x weekly - 1 sets - 3 reps - 30 hold - Seated Quad Set  - 2 x daily - 6 x weekly - 1 sets - 15 reps - 5 sec hold - Seated Straight Leg Raise with Quad Contraction  - 2 x daily - 6 x weekly - 2-3 sets - 10 reps - Seated Knee Flexion Stretch  - 2 x daily - 6 x weekly - 1-2 sets - 10 reps - 5 sec hold - Seated Knee Extension Stretch with Chair  - 2 x daily - 6 x weekly - 2-3 sets - 2-3 min hold - Sit to Stand  - 2 x daily - 6 x weekly - 1-2 sets - 10 reps - Standing Gastroc Stretch at [employer] (Mirrored)  - 2 x daily - 6 x weekly - 1 sets - 3 reps - 30 hold  ASSESSMENT:  CLINICAL IMPRESSION:  Knee ROM showed improvements since eval. We will continue to work  to improve his ROM and strength as tolerated to improve  function.   OBJECTIVE IMPAIRMENTS: decreased activity tolerance, difficulty walking, decreased balance, decreased endurance, decreased mobility, decreased ROM, decreased strength, impaired flexibility, impaired LE use, and pain.  ACTIVITY LIMITATIONS: bending, lifting, carry, locomotion, cleaning, community activity, driving  PERSONAL FACTORS: see above PMH are also affecting patient's functional outcome.  REHAB POTENTIAL: Good  CLINICAL DECISION MAKING: Stable/uncomplicated  EVALUATION COMPLEXITY: Low    GOALS: Short term PT Goals Target date: 04/10/2023   Pt will be I and compliant with HEP. Baseline:  Goal status: New Pt will decrease pain by 25% overall Baseline:6/10 Goal status: New  Long term PT goals Target date:05/08/2023   Pt will improve Rt knee PROM 3-120 deg to improve functional mobility Baseline: Goal status: New Pt will improve  Rt hip/knee strength to at least 5-/5 MMT to improve functional strength Baseline: Goal status: New Pt will improve FOTO to at least 53% functional to show improved function Baseline: Goal status: New  Pt will reduce pain to overall less than 2-3/10 with usual activity and work activity. Baseline: Goal status: New Pt will be able to ambulate community distances at least 1000 ft WNL gait pattern without complaints Baseline: Goal status: New  PLAN: PT FREQUENCY: 1-3 times per week   PT DURATION: 4-8 weeks  PLANNED INTERVENTIONS (unless contraindicated): aquatic PT, Canalith repositioning, cryotherapy, Electrical stimulation, Iontophoresis with 4 mg/ml dexamethasome, Moist heat, traction, Ultrasound, gait training, Therapeutic exercise, balance training, neuromuscular re-education, patient/family education, prosthetic training, manual techniques, passive ROM, dry needling, taping, vasopnuematic device, vestibular, spinal manipulations, joint manipulations  PLAN FOR NEXT SESSION: extension ROM focus and quad strengthening to  tolerance NEXT MD VISIT: 04/04/23  Ivery Quale, PT, DPT 03/20/23 10:51 AM      a

## 2023-03-22 ENCOUNTER — Encounter: Payer: Self-pay | Admitting: Physical Therapy

## 2023-03-22 ENCOUNTER — Ambulatory Visit (INDEPENDENT_AMBULATORY_CARE_PROVIDER_SITE_OTHER): Payer: Medicare PPO | Admitting: Physical Therapy

## 2023-03-22 DIAGNOSIS — R262 Difficulty in walking, not elsewhere classified: Secondary | ICD-10-CM

## 2023-03-22 DIAGNOSIS — M25561 Pain in right knee: Secondary | ICD-10-CM

## 2023-03-22 DIAGNOSIS — M25661 Stiffness of right knee, not elsewhere classified: Secondary | ICD-10-CM

## 2023-03-22 DIAGNOSIS — M6281 Muscle weakness (generalized): Secondary | ICD-10-CM

## 2023-03-22 DIAGNOSIS — R600 Localized edema: Secondary | ICD-10-CM

## 2023-03-22 NOTE — Therapy (Signed)
OUTPATIENT PHYSICAL THERAPY LOWER EXTREMITY TREATMENT  Referring diagnosis? Z61.096 Treatment diagnosis? (if different than referring diagnosis) M25.561 What was this (referring dx) caused by? [x]  Surgery []  Fall []  Ongoing issue []  Arthritis []  Other: ____________  Laterality: [x]  Rt []  Lt []  Both  Check all possible CPT codes:  *CHOOSE 10 OR LESS*    []  97110 (Therapeutic Exercise)  []  92507 (SLP Treatment)  []  04540 (Neuro Re-ed)   []  92526 (Swallowing Treatment)   []  97116 (Gait Training)   []  [id number] (Cognitive Training, 1st 15 minutes) []  97140 (Manual Therapy)   []  98119 (Cognitive Training, each add'l 15 minutes)  []  97164 (Re-evaluation)                              []  Other, List CPT Code ____________  []  97530 (Therapeutic Activities)     []  97535 (Self Care)   [x]  All codes above (97110 - 97535)  []  97012 (Mechanical Traction)  []  97014 (E-stim Unattended)  []  97032 (E-stim manual)  []  97033 (Ionto)  []  97035 (Ultrasound) []  97750 (Physical Performance Training) []  [id number] (Aquatic Therapy) [x]  97016 (Vasopneumatic Device) []  [id number] (Paraffin) []  97034 (Contrast Bath) []  97597 (Wound Care 1st 20 sq cm) []  97598 (Wound Care each add'l 20 sq cm) []  97760 (Orthotic Fabrication, Fitting, Training Initial) []  [id number] (Prosthetic Management and Training Initial) []  [id number] (Orthotic or Prosthetic Training/ Modification Subsequent)  Patient Name: Douglas Carpenter MRN: [id number] DOB:1955/12/13, 67 y.o., male Today's Date: 03/22/2023  END OF SESSION:  PT End of Session - 03/22/23 1125     Visit Number 4    Number of Visits 15    Date for PT Re-Evaluation 05/08/23    Authorization Type tricare, human MCR    Progress Note Due on Visit 10    PT Start Time 1123    PT Stop Time 1213    PT Time Calculation (min) 50 min    Activity Tolerance Patient tolerated treatment well    Behavior During Therapy WFL for tasks assessed/performed              Past Medical  History:  Diagnosis Date   Allergy    Arthritis    "everywhere"   BPH (benign prostatic hypertrophy)    Chronic rhinitis 01/27/2009   ED (erectile dysfunction)    GERD (gastroesophageal reflux disease)    Headache(784.0)    migraines, as many as 3 in a week     HYPERLIPIDEMIA 01/27/2009   HYPERTENSION 01/27/2009   Mild asthma    seasonal allergies, uses Proair once per yr.    Mild obstructive sleep apnea    per pt -- mild osa test result--  no rx cpap needed   Neuromuscular disorder (HCC)    Sleep apnea    no cpap   SOB (shortness of breath)    Testosterone deficiency    Urethral stricture    Vocal cord anomaly    pt states told from [place]  doctor heard a little gurgle sound -- no farther testing done   Past Surgical History:  Procedure Laterality Date   BACK SURGERY     CARPAL TUNNEL RELEASE Bilateral    Dr Roda Shutters   COLONOSCOPY     CYSTOSCOPY WITH URETHRAL DILATATION N/A 01/07/2013   Procedure: CYSTOSCOPY WITH URETHRAL DILATATION BALLOON DILATION ;  Surgeon: Valetta Fuller, MD;  Location: [facility];  Service: Urology;  Laterality: N/A;  LUMBAR LAMINECTOMY/DECOMPRESSION MICRODISCECTOMY Right 11/29/2012   Procedure: Right Lumbar Four to Five Laminectomy/ Decompression Microdiskectomy;  Surgeon: Clydene Fake, MD;  Location: [facility];  Service: Neurosurgery;  Laterality: Right;  LUMBAR LAMINECTOMY/DECOMPRESSION MICRODISCECTOMY 1 LEVEL   POLYPECTOMY     SHOULDER ARTHROSCOPY W/ ACROMIAL REPAIR Left 08/28/2002   TOTAL KNEE ARTHROPLASTY Left 12/02/2019   TOTAL KNEE ARTHROPLASTY Left 12/02/2019   Procedure: LEFT TOTAL KNEE ARTHROPLASTY;  Surgeon: Tarry Kos, MD;  Location: [facility];  Service: Orthopedics;  Laterality: Left;   TOTAL KNEE ARTHROPLASTY Right 02/20/2023   Procedure: RIGHT TOTAL KNEE ARTHROPLASTY;  Surgeon: Tarry Kos, MD;  Location: [facility];  Service: Orthopedics;  Laterality: Right;   UPPER GASTROINTESTINAL ENDOSCOPY     Patient Active Problem List    Diagnosis Date Noted   Status post total right knee replacement 02/20/2023   Pain in left ankle and joints of left foot 07/20/2022   Primary osteoarthritis of first carpometacarpal joint of right hand 07/20/2022   Right carpal tunnel syndrome 11/11/2021   Acute asthmatic bronchitis 06/14/2021   Other headache syndrome 05/17/2021   Body aches 05/17/2021   Acute cough 05/17/2021   Shortness of breath 05/17/2021   Influenza A 05/17/2021   Arthritis of carpometacarpal (CMC) joint of right thumb 04/05/2021   Bilateral hand numbness 04/05/2021   Type II diabetes mellitus with manifestations (HCC) 03/18/2021   Diuretic-induced hypokalemia 10/22/2020   Encounter for general adult medical examination with abnormal findings 08/05/2020   Rising PSA level 08/04/2020   Stage 3a chronic kidney disease (HCC) 08/04/2020   Prediabetes 08/03/2020   Primary hypertension 08/03/2020   Benign prostatic hyperplasia without lower urinary tract symptoms 08/03/2020   Hyperlipidemia with target LDL less than 130 08/03/2020   Primary osteoarthritis of left knee 04/16/2019   Primary osteoarthritis of right knee 02/20/2019   Exercise-induced asthma 07/21/2014   Chronic cough 07/21/2014   Upper airway cough syndrome 07/21/2014   Allergic rhinitis 07/21/2014   Lumbar stenosis 11/06/2013   Urethral stricture 01/07/2013   VOCAL CORD DISORDER 02/19/2009   Asthma 01/27/2009   GERD 01/27/2009    PCP: Corliss Blacker, MD   REFERRING PROVIDER: Cristie Hem, PA-C Ref Provider  REFERRING DIAG:  Diagnosis  [phone] (ICD-10-CM) - Status post total right knee replacement    THERAPY DIAG:  Acute pain of right knee  Difficulty in walking, not elsewhere classified  Muscle weakness (generalized)  Stiffness of right knee, not elsewhere classified  Localized edema  Rationale for Evaluation and Treatment: Rehabilitation  ONSET DATE: Rt TKA 02/20/23  SUBJECTIVE:   SUBJECTIVE STATEMENT: Relays  his knee is getting better, he is no longer having to take pain meds  PERTINENT HISTORY:02/20/2023 for R TKA. PMH includes back surgery, asthma, GERD, ureteral stricture, HTN, BPH, HLD, CKD III, LT TKA 2021  PAIN:  Are you having pain? Yes: NPRS scale: 3/10 Pain location: R LE in shin/calf/ankle  Pain description: ache, throbbing and sore  Aggravating factors: activity, touching it  Relieving factors: ice, meds  PRECAUTIONS: None  RED FLAGS: None   WEIGHT BEARING RESTRICTIONS: No  FALLS:  Has patient fallen in last 6 months? No  PLOF: Independent  PATIENT GOALS: reduce pain, and get back to normal   OBJECTIVE:   DIAGNOSTIC FINDINGS: pt relays [place] was negative for blood clot   PATIENT SURVEYS:  Eval: FOTO 35% functional, goal is 53%  COGNITION: Overall cognitive status: Within functional limits for tasks assessed  EDEMA:  Eval: Moderate edema in Rt knee  MUSCLE LENGTH: Eval: gastroc and Hamstrings: Right are very tight    LOWER EXTREMITY ROM:  Active ROM/PROM Right eval 03/20/23  Hip flexion    Hip extension    Hip abduction    Hip adduction    Hip internal rotation    Hip external rotation    Knee flexion A:100 P:110 P:112  Knee extension A:15 P:10 A:10 P:6   Ankle dorsiflexion    Ankle plantarflexion    Ankle inversion    Ankle eversion     (Blank rows = not tested)  LOWER EXTREMITY MMT:  MMT Right Tested in sitting eval   Hip flexion 4   Hip extension    Hip abduction 5   Hip adduction    Hip internal rotation    Hip external rotation    Knee flexion 4   Knee extension 4   Ankle dorsiflexion    Ankle plantarflexion    Ankle inversion    Ankle eversion     (Blank rows = not tested)   FUNCTIONAL TESTS:  Eval:Sit to stand: can stand without hands but less weight shift to Rt LE  GAIT: Eval Comments: uses SPC currently for ambulation, slower velocity. PLOF is no AD for gait   TODAY'S TREATMENT:  03/22/23 TherEX Nustep L4 x8  minutes LE only Gastroc stretches R LE 3x30 seconds with slantboard Heel and toe raises X 20 Step ups 6 inch step leading with Rt leg, no UE support X 10 forward and X 10 lateral Shuttle: BLEs 68# 2 x15, R LE only 37# 3 X 10  Seated knee flexion AAROM 15x5 second holds  Knee extension stretches 3# on knee and heel prop x3 minutes HS stretches 3x30 seconds R LE LAQs 4# 2 X15 second holds Sit to stands no UE support X 15 Manual Knee extension OP supine to tolerance  Modalities Vasopnuematic device X 10 min, medium compression, 34 deg to Rt knee   03/20/23 TherEX Nustep L4 x8 minutes LE only Gastroc stretches R LE 3x30 seconds with slantboard Shuttle: BLEs 62# 2 x15, R LE only 31# 2x10  Seated knee flexion AAROM 15x5 second holds  Knee extension stretches 3# on knee and heel prop x3 minutes HS stretches 3x30 seconds R LE LAQs 3# 2 X15 second holds Sit to stands no UE support X 10 Manual Knee extension OP supine to tolerance   03/17/23   TherEX   Nustep L1 x8 minutes with holds into flexion and extension stretches R knee, seat progressions as able/tolerated Seated knee flexion AAROM 12x5 second holds  Knee extension stretches 3# on knee and heel prop x3 minutes HS stretches 3x30 seconds R LE Supine SAQs 3# 12x3 second holds LAQs 3# 12x3 second holds Gastroc stretches R LE 3x30 seconds with strap  Shuttle: BLEs 50# x12, R LE only 25# x12      PATIENT EDUCATION: Education details: HEP, PT plan of care Person educated: Patient Education method: Explanation, Demonstration, Verbal cues, and Handouts Education comprehension: verbalized understanding and needs further education   HOME EXERCISE PROGRAM: Access Code: [email or web address] URL: [email or web address] Date: 03/13/2023 Prepared by: Ivery Quale  Exercises - Seated Hamstring Stretch  - 2 x daily - 6 x weekly - 1 sets - 3 reps - 30 hold - Seated Quad Set  - 2 x daily - 6 x weekly - 1 sets - 15 reps - 5 sec  hold - Seated Straight Leg Raise with Quad Contraction  -  2 x daily - 6 x weekly - 2-3 sets - 10 reps - Seated Knee Flexion Stretch  - 2 x daily - 6 x weekly - 1-2 sets - 10 reps - 5 sec hold - Seated Knee Extension Stretch with Chair  - 2 x daily - 6 x weekly - 2-3 sets - 2-3 min hold - Sit to Stand  - 2 x daily - 6 x weekly - 1-2 sets - 10 reps - Standing Gastroc Stretch at [employer] (Mirrored)  - 2 x daily - 6 x weekly - 1 sets - 3 reps - 30 hold  ASSESSMENT:  CLINICAL IMPRESSION:  He was doing well with pain and had good tolerance to activity progressions in PT working to improve knee ROM and strength. Knee extension ROM continues to improve.    OBJECTIVE IMPAIRMENTS: decreased activity tolerance, difficulty walking, decreased balance, decreased endurance, decreased mobility, decreased ROM, decreased strength, impaired flexibility, impaired LE use, and pain.  ACTIVITY LIMITATIONS: bending, lifting, carry, locomotion, cleaning, community activity, driving  PERSONAL FACTORS: see above PMH are also affecting patient's functional outcome.  REHAB POTENTIAL: Good  CLINICAL DECISION MAKING: Stable/uncomplicated  EVALUATION COMPLEXITY: Low    GOALS: Short term PT Goals Target date: 04/10/2023   Pt will be I and compliant with HEP. Baseline:  Goal status: New Pt will decrease pain by 25% overall Baseline:6/10 Goal status: New  Long term PT goals Target date:05/08/2023   Pt will improve Rt knee PROM 3-120 deg to improve functional mobility Baseline: Goal status: New Pt will improve  Rt hip/knee strength to at least 5-/5 MMT to improve functional strength Baseline: Goal status: New Pt will improve FOTO to at least 53% functional to show improved function Baseline: Goal status: New  Pt will reduce pain to overall less than 2-3/10 with usual activity and work activity. Baseline: Goal status: New Pt will be able to ambulate community distances at least 1000 ft WNL gait  pattern without complaints Baseline: Goal status: New  PLAN: PT FREQUENCY: 1-3 times per week   PT DURATION: 4-8 weeks  PLANNED INTERVENTIONS (unless contraindicated): aquatic PT, Canalith repositioning, cryotherapy, Electrical stimulation, Iontophoresis with 4 mg/ml dexamethasome, Moist heat, traction, Ultrasound, gait training, Therapeutic exercise, balance training, neuromuscular re-education, patient/family education, prosthetic training, manual techniques, passive ROM, dry needling, taping, vasopnuematic device, vestibular, spinal manipulations, joint manipulations  PLAN FOR NEXT SESSION: extension ROM focus and quad strengthening to tolerance NEXT MD VISIT: 04/04/23  Ivery Quale, PT, DPT 03/22/23 11:56 AM

## 2023-03-27 ENCOUNTER — Encounter: Payer: Self-pay | Admitting: Physical Therapy

## 2023-03-27 ENCOUNTER — Ambulatory Visit (INDEPENDENT_AMBULATORY_CARE_PROVIDER_SITE_OTHER): Payer: Medicare PPO | Admitting: Physical Therapy

## 2023-03-27 DIAGNOSIS — R262 Difficulty in walking, not elsewhere classified: Secondary | ICD-10-CM

## 2023-03-27 DIAGNOSIS — M6281 Muscle weakness (generalized): Secondary | ICD-10-CM | POA: Diagnosis not present

## 2023-03-27 DIAGNOSIS — M25561 Pain in right knee: Secondary | ICD-10-CM

## 2023-03-27 DIAGNOSIS — M25661 Stiffness of right knee, not elsewhere classified: Secondary | ICD-10-CM | POA: Diagnosis not present

## 2023-03-27 DIAGNOSIS — R600 Localized edema: Secondary | ICD-10-CM

## 2023-03-27 NOTE — Therapy (Signed)
OUTPATIENT PHYSICAL THERAPY LOWER EXTREMITY TREATMENT  Referring diagnosis? W41.324 Treatment diagnosis? (if different than referring diagnosis) M25.561 What was this (referring dx) caused by? [x]  Surgery []  Fall []  Ongoing issue []  Arthritis []  Other: ____________  Laterality: [x]  Rt []  Lt []  Both  Check all possible CPT codes:  *CHOOSE 10 OR LESS*    []  97110 (Therapeutic Exercise)  []  92507 (SLP Treatment)  []  97112 (Neuro Re-ed)   []  92526 (Swallowing Treatment)   []  97116 (Gait Training)   []  [id number] (Cognitive Training, 1st 15 minutes) []  40102 (Manual Therapy)   []  97130 (Cognitive Training, each add'l 15 minutes)  []  97164 (Re-evaluation)                              []  Other, List CPT Code ____________  []  97530 (Therapeutic Activities)     []  97535 (Self Care)   [x]  All codes above (97110 - 97535)  []  97012 (Mechanical Traction)  []  97014 (E-stim Unattended)  []  97032 (E-stim manual)  []  97033 (Ionto)  []  97035 (Ultrasound) []  97750 (Physical Performance Training) []  [id number] (Aquatic Therapy) [x]  97016 (Vasopneumatic Device) []  [id number] (Paraffin) []  97034 (Contrast Bath) []  97597 (Wound Care 1st 20 sq cm) []  97598 (Wound Care each add'l 20 sq cm) []  97760 (Orthotic Fabrication, Fitting, Training Initial) []  [id number] (Prosthetic Management and Training Initial) []  [id number] (Orthotic or Prosthetic Training/ Modification Subsequent)  Patient Name: Douglas Carpenter MRN: [id number] DOB:08/07/1955, 67 y.o., male Today's Date: 03/27/2023  END OF SESSION:  PT End of Session - 03/27/23 0803     Visit Number 5    Number of Visits 15    Date for PT Re-Evaluation 05/08/23    Authorization Type [employer], human MCR    Progress Note Due on Visit 10    PT Start Time 0800    PT Stop Time 0848    PT Time Calculation (min) 48 min    Activity Tolerance Patient tolerated treatment well    Behavior During Therapy WFL for tasks assessed/performed              Past Medical  History:  Diagnosis Date   Allergy    Arthritis    "everywhere"   BPH (benign prostatic hypertrophy)    Chronic rhinitis 01/27/2009   ED (erectile dysfunction)    GERD (gastroesophageal reflux disease)    Headache(784.0)    migraines, as many as 3 in a week     HYPERLIPIDEMIA 01/27/2009   HYPERTENSION 01/27/2009   Mild asthma    seasonal allergies, uses Proair once per yr.    Mild obstructive sleep apnea    per pt -- mild osa test result--  no rx cpap needed   Neuromuscular disorder (HCC)    Sleep apnea    no cpap   SOB (shortness of breath)    Testosterone deficiency    Urethral stricture    Vocal cord anomaly    pt states told from [place]  doctor heard a little gurgle sound -- no farther testing done   Past Surgical History:  Procedure Laterality Date   BACK SURGERY     CARPAL TUNNEL RELEASE Bilateral    Dr Roda Shutters   COLONOSCOPY     CYSTOSCOPY WITH URETHRAL DILATATION N/A 01/07/2013   Procedure: CYSTOSCOPY WITH URETHRAL DILATATION BALLOON DILATION ;  Surgeon: Valetta Fuller, MD;  Location: [facility];  Service: Urology;  Laterality: N/A;  LUMBAR LAMINECTOMY/DECOMPRESSION MICRODISCECTOMY Right 11/29/2012   Procedure: Right Lumbar Four to Five Laminectomy/ Decompression Microdiskectomy;  Surgeon: Clydene Fake, MD;  Location: [facility];  Service: Neurosurgery;  Laterality: Right;  LUMBAR LAMINECTOMY/DECOMPRESSION MICRODISCECTOMY 1 LEVEL   POLYPECTOMY     SHOULDER ARTHROSCOPY W/ ACROMIAL REPAIR Left 08/28/2002   TOTAL KNEE ARTHROPLASTY Left 12/02/2019   TOTAL KNEE ARTHROPLASTY Left 12/02/2019   Procedure: LEFT TOTAL KNEE ARTHROPLASTY;  Surgeon: Tarry Kos, MD;  Location: [facility];  Service: Orthopedics;  Laterality: Left;   TOTAL KNEE ARTHROPLASTY Right 02/20/2023   Procedure: RIGHT TOTAL KNEE ARTHROPLASTY;  Surgeon: Tarry Kos, MD;  Location: [facility];  Service: Orthopedics;  Laterality: Right;   UPPER GASTROINTESTINAL ENDOSCOPY     Patient Active Problem List    Diagnosis Date Noted   Status post total right knee replacement 02/20/2023   Pain in left ankle and joints of left foot 07/20/2022   Primary osteoarthritis of first carpometacarpal joint of right hand 07/20/2022   Right carpal tunnel syndrome 11/11/2021   Acute asthmatic bronchitis 06/14/2021   Other headache syndrome 05/17/2021   Body aches 05/17/2021   Acute cough 05/17/2021   Shortness of breath 05/17/2021   Influenza A 05/17/2021   Arthritis of carpometacarpal (CMC) joint of right thumb 04/05/2021   Bilateral hand numbness 04/05/2021   Type II diabetes mellitus with manifestations (HCC) 03/18/2021   Diuretic-induced hypokalemia 10/22/2020   Encounter for general adult medical examination with abnormal findings 08/05/2020   Rising PSA level 08/04/2020   Stage 3a chronic kidney disease (HCC) 08/04/2020   Prediabetes 08/03/2020   Primary hypertension 08/03/2020   Benign prostatic hyperplasia without lower urinary tract symptoms 08/03/2020   Hyperlipidemia with target LDL less than 130 08/03/2020   Primary osteoarthritis of left knee 04/16/2019   Primary osteoarthritis of right knee 02/20/2019   Exercise-induced asthma 07/21/2014   Chronic cough 07/21/2014   Upper airway cough syndrome 07/21/2014   Allergic rhinitis 07/21/2014   Lumbar stenosis 11/06/2013   Urethral stricture 01/07/2013   VOCAL CORD DISORDER 02/19/2009   Asthma 01/27/2009   GERD 01/27/2009    PCP: Corliss Blacker, MD   REFERRING PROVIDER: Cristie Hem, PA-C Ref Provider  REFERRING DIAG:  Diagnosis  [phone] (ICD-10-CM) - Status post total right knee replacement    THERAPY DIAG:  Acute pain of right knee  Difficulty in walking, not elsewhere classified  Muscle weakness (generalized)  Stiffness of right knee, not elsewhere classified  Localized edema  Rationale for Evaluation and Treatment: Rehabilitation  ONSET DATE: Rt TKA 02/20/23  SUBJECTIVE:   SUBJECTIVE STATEMENT: Relays  knee is doing well today  PERTINENT HISTORY:02/20/2023 for R TKA. PMH includes back surgery, asthma, GERD, ureteral stricture, HTN, BPH, HLD, CKD III, LT TKA 2021  PAIN:  Are you having pain? Yes: NPRS scale: 4 depending on the activity/10 Pain location: R LE in shin/calf/ankle  Pain description: ache, throbbing and sore  Aggravating factors: activity, touching it  Relieving factors: ice, meds  PRECAUTIONS: None  RED FLAGS: None   WEIGHT BEARING RESTRICTIONS: No  FALLS:  Has patient fallen in last 6 months? No  PLOF: Independent  PATIENT GOALS: reduce pain, and get back to normal   OBJECTIVE:   DIAGNOSTIC FINDINGS: pt relays [place] was negative for blood clot   PATIENT SURVEYS:  Eval: FOTO 35% functional, goal is 53%  COGNITION: Overall cognitive status: Within functional limits for tasks assessed     EDEMA:  Eval: Moderate edema  in Rt knee  MUSCLE LENGTH: Eval: gastroc and Hamstrings: Right are very tight    LOWER EXTREMITY ROM:  Active ROM/PROM Right eval 03/20/23  Hip flexion    Hip extension    Hip abduction    Hip adduction    Hip internal rotation    Hip external rotation    Knee flexion A:100 P:110 P:112  Knee extension A:15 P:10 A:10 P:6   Ankle dorsiflexion    Ankle plantarflexion    Ankle inversion    Ankle eversion     (Blank rows = not tested)  LOWER EXTREMITY MMT:  MMT Right Tested in sitting eval   Hip flexion 4   Hip extension    Hip abduction 5   Hip adduction    Hip internal rotation    Hip external rotation    Knee flexion 4   Knee extension 4   Ankle dorsiflexion    Ankle plantarflexion    Ankle inversion    Ankle eversion     (Blank rows = not tested)   FUNCTIONAL TESTS:  Eval:Sit to stand: can stand without hands but less weight shift to Rt LE  GAIT: Eval Comments: uses SPC currently for ambulation, slower velocity. PLOF is no AD for gait   TODAY'S TREATMENT:  03/27/23 TherEX Recumbent bike seat #6 L2 X 8  min Gastroc stretches R LE 3x30 seconds with slantboard Heel and toe raises X 20 Step ups 6 inch step leading with Rt leg, no UE support X 15 forward and X 15 lateral Shuttle: BLEs 75# 2 x15, R LE only 37# 2 X 15 Seated knee extension machine Rt leg only 5# (increased to 10# next time) 2X10, then DL up with Rt only down 10# 2X10 Seated knee flexion machine DL 62# 6R48 Seated knee flexion AAROM 15x5 second holds  HS stretches 3x30 seconds R LE Knee extension stretches heel prop on bolster first 3 minutes of vaso Manual Knee extension OP supine to tolerance  Modalities Vasopnuematic device X 10 min, medium compression, 34 deg to Rt knee  03/22/23 TherEX Nustep L4 x8 minutes LE only Gastroc stretches R LE 3x30 seconds with slantboard Heel and toe raises X 20 Step ups 6 inch step leading with Rt leg, no UE support X 10 forward and X 10 lateral Shuttle: BLEs 68# 2 x15, R LE only 37# 3 X 10  Seated knee flexion AAROM 15x5 second holds  Knee extension stretches 3# on knee and heel prop x3 minutes HS stretches 3x30 seconds R LE LAQs 4# 2 X15 second holds Sit to stands no UE support X 15 Manual Knee extension OP supine to tolerance  Modalities Vasopnuematic device X 10 min, medium compression, 34 deg to Rt knee   03/20/23 TherEX Nustep L4 x8 minutes LE only Gastroc stretches R LE 3x30 seconds with slantboard Shuttle: BLEs 62# 2 x15, R LE only 31# 2x10  Seated knee flexion AAROM 15x5 second holds  Knee extension stretches 3# on knee and heel prop x3 minutes HS stretches 3x30 seconds R LE LAQs 3# 2 X15 second holds Sit to stands no UE support X 10 Manual Knee extension OP supine to tolerance        PATIENT EDUCATION: Education details: HEP, PT plan of care Person educated: Patient Education method: Explanation, Demonstration, Verbal cues, and Handouts Education comprehension: verbalized understanding and needs further education   HOME EXERCISE PROGRAM: Access Code:  [email or web address] URL: [email or web address] Date: 03/13/2023 Prepared by: Ivery Quale  Exercises - Seated  Hamstring Stretch  - 2 x daily - 6 x weekly - 1 sets - 3 reps - 30 hold - Seated Quad Set  - 2 x daily - 6 x weekly - 1 sets - 15 reps - 5 sec hold - Seated Straight Leg Raise with Quad Contraction  - 2 x daily - 6 x weekly - 2-3 sets - 10 reps - Seated Knee Flexion Stretch  - 2 x daily - 6 x weekly - 1-2 sets - 10 reps - 5 sec hold - Seated Knee Extension Stretch with Chair  - 2 x daily - 6 x weekly - 2-3 sets - 2-3 min hold - Sit to Stand  - 2 x daily - 6 x weekly - 1-2 sets - 10 reps - Standing Gastroc Stretch at [employer] (Mirrored)  - 2 x daily - 6 x weekly - 1 sets - 3 reps - 30 hold  ASSESSMENT:  CLINICAL IMPRESSION:  He was able to add more resistance to his strengthening program today with good overall tolerance. ROM continues to progress as well. Continue with current PT plan.    OBJECTIVE IMPAIRMENTS: decreased activity tolerance, difficulty walking, decreased balance, decreased endurance, decreased mobility, decreased ROM, decreased strength, impaired flexibility, impaired LE use, and pain.  ACTIVITY LIMITATIONS: bending, lifting, carry, locomotion, cleaning, community activity, driving  PERSONAL FACTORS: see above PMH are also affecting patient's functional outcome.  REHAB POTENTIAL: Good  CLINICAL DECISION MAKING: Stable/uncomplicated  EVALUATION COMPLEXITY: Low    GOALS: Short term PT Goals Target date: 04/10/2023   Pt will be I and compliant with HEP. Baseline:  Goal status: MET 03/27/23 Pt will decrease pain by 25% overall Baseline:6/10 Goal status: Met 03/27/23  Long term PT goals Target date:05/08/2023   Pt will improve Rt knee PROM 3-120 deg to improve functional mobility Baseline: Goal status: New Pt will improve  Rt hip/knee strength to at least 5-/5 MMT to improve functional strength Baseline: Goal status: New Pt will improve FOTO to  at least 53% functional to show improved function Baseline: Goal status: New  Pt will reduce pain to overall less than 2-3/10 with usual activity and work activity. Baseline: Goal status: New Pt will be able to ambulate community distances at least 1000 ft WNL gait pattern without complaints Baseline: Goal status: New  PLAN: PT FREQUENCY: 1-3 times per week   PT DURATION: 4-8 weeks  PLANNED INTERVENTIONS (unless contraindicated): aquatic PT, Canalith repositioning, cryotherapy, Electrical stimulation, Iontophoresis with 4 mg/ml dexamethasome, Moist heat, traction, Ultrasound, gait training, Therapeutic exercise, balance training, neuromuscular re-education, patient/family education, prosthetic training, manual techniques, passive ROM, dry needling, taping, vasopnuematic device, vestibular, spinal manipulations, joint manipulations  PLAN FOR NEXT SESSION: extension ROM focus and quad strengthening to tolerance NEXT MD VISIT: 04/04/23  Ivery Quale, PT, DPT 03/27/23 8:37 AM

## 2023-03-29 ENCOUNTER — Ambulatory Visit (INDEPENDENT_AMBULATORY_CARE_PROVIDER_SITE_OTHER): Payer: Medicare PPO | Admitting: Physical Therapy

## 2023-03-29 ENCOUNTER — Encounter: Payer: Self-pay | Admitting: Physical Therapy

## 2023-03-29 DIAGNOSIS — M25561 Pain in right knee: Secondary | ICD-10-CM

## 2023-03-29 DIAGNOSIS — M25661 Stiffness of right knee, not elsewhere classified: Secondary | ICD-10-CM | POA: Diagnosis not present

## 2023-03-29 DIAGNOSIS — R262 Difficulty in walking, not elsewhere classified: Secondary | ICD-10-CM

## 2023-03-29 DIAGNOSIS — M6281 Muscle weakness (generalized): Secondary | ICD-10-CM | POA: Diagnosis not present

## 2023-03-29 DIAGNOSIS — R600 Localized edema: Secondary | ICD-10-CM

## 2023-03-29 NOTE — Therapy (Signed)
OUTPATIENT PHYSICAL THERAPY LOWER EXTREMITY TREATMENT  Referring diagnosis? Z61.096 Treatment diagnosis? (if different than referring diagnosis) M25.561 What was this (referring dx) caused by? [x]  Surgery []  Fall []  Ongoing issue []  Arthritis []  Other: ____________  Laterality: [x]  Rt []  Lt []  Both  Check all possible CPT codes:  *CHOOSE 10 OR LESS*    []  97110 (Therapeutic Exercise)  []  92507 (SLP Treatment)  []  97112 (Neuro Re-ed)   []  92526 (Swallowing Treatment)   []  97116 (Gait Training)   []  [id number] (Cognitive Training, 1st 15 minutes) []  97140 (Manual Therapy)   []  97130 (Cognitive Training, each add'l 15 minutes)  []  97164 (Re-evaluation)                              []  Other, List CPT Code ____________  []  97530 (Therapeutic Activities)     []  97535 (Self Care)   [x]  All codes above (97110 - 97535)  []  97012 (Mechanical Traction)  []  97014 (E-stim Unattended)  []  97032 (E-stim manual)  []  97033 (Ionto)  []  97035 (Ultrasound) []  97750 (Physical Performance Training) []  [id number] (Aquatic Therapy) [x]  97016 (Vasopneumatic Device) []  [id number] (Paraffin) []  97034 (Contrast Bath) []  97597 (Wound Care 1st 20 sq cm) []  97598 (Wound Care each add'l 20 sq cm) []  97760 (Orthotic Fabrication, Fitting, Training Initial) []  [id number] (Prosthetic Management and Training Initial) []  [id number] (Orthotic or Prosthetic Training/ Modification Subsequent)  Patient Name: AMOR WIESNER MRN: [id number] DOB:11-18-1955, 67 y.o., male Today's Date: 03/29/2023  END OF SESSION:  PT End of Session - 03/29/23 0949     Visit Number 6    Number of Visits 15    Date for PT Re-Evaluation 05/08/23    Authorization Type [employer], human MCR    Progress Note Due on Visit 10    PT Start Time 0930    PT Stop Time 1018    PT Time Calculation (min) 48 min    Activity Tolerance Patient tolerated treatment well    Behavior During Therapy WFL for tasks assessed/performed              Past Medical  History:  Diagnosis Date   Allergy    Arthritis    "everywhere"   BPH (benign prostatic hypertrophy)    Chronic rhinitis 01/27/2009   ED (erectile dysfunction)    GERD (gastroesophageal reflux disease)    Headache(784.0)    migraines, as many as 3 in a week     HYPERLIPIDEMIA 01/27/2009   HYPERTENSION 01/27/2009   Mild asthma    seasonal allergies, uses Proair once per yr.    Mild obstructive sleep apnea    per pt -- mild osa test result--  no rx cpap needed   Neuromuscular disorder (HCC)    Sleep apnea    no cpap   SOB (shortness of breath)    Testosterone deficiency    Urethral stricture    Vocal cord anomaly    pt states told from [place]  doctor heard a little gurgle sound -- no farther testing done   Past Surgical History:  Procedure Laterality Date   BACK SURGERY     CARPAL TUNNEL RELEASE Bilateral    Dr Roda Shutters   COLONOSCOPY     CYSTOSCOPY WITH URETHRAL DILATATION N/A 01/07/2013   Procedure: CYSTOSCOPY WITH URETHRAL DILATATION BALLOON DILATION ;  Surgeon: Valetta Fuller, MD;  Location: [facility];  Service: Urology;  Laterality: N/A;  LUMBAR LAMINECTOMY/DECOMPRESSION MICRODISCECTOMY Right 11/29/2012   Procedure: Right Lumbar Four to Five Laminectomy/ Decompression Microdiskectomy;  Surgeon: Clydene Fake, MD;  Location: [facility];  Service: Neurosurgery;  Laterality: Right;  LUMBAR LAMINECTOMY/DECOMPRESSION MICRODISCECTOMY 1 LEVEL   POLYPECTOMY     SHOULDER ARTHROSCOPY W/ ACROMIAL REPAIR Left 08/28/2002   TOTAL KNEE ARTHROPLASTY Left 12/02/2019   TOTAL KNEE ARTHROPLASTY Left 12/02/2019   Procedure: LEFT TOTAL KNEE ARTHROPLASTY;  Surgeon: Tarry Kos, MD;  Location: [facility];  Service: Orthopedics;  Laterality: Left;   TOTAL KNEE ARTHROPLASTY Right 02/20/2023   Procedure: RIGHT TOTAL KNEE ARTHROPLASTY;  Surgeon: Tarry Kos, MD;  Location: [facility];  Service: Orthopedics;  Laterality: Right;   UPPER GASTROINTESTINAL ENDOSCOPY     Patient Active Problem List    Diagnosis Date Noted   Status post total right knee replacement 02/20/2023   Pain in left ankle and joints of left foot 07/20/2022   Primary osteoarthritis of first carpometacarpal joint of right hand 07/20/2022   Right carpal tunnel syndrome 11/11/2021   Acute asthmatic bronchitis 06/14/2021   Other headache syndrome 05/17/2021   Body aches 05/17/2021   Acute cough 05/17/2021   Shortness of breath 05/17/2021   Influenza A 05/17/2021   Arthritis of carpometacarpal (CMC) joint of right thumb 04/05/2021   Bilateral hand numbness 04/05/2021   Type II diabetes mellitus with manifestations (HCC) 03/18/2021   Diuretic-induced hypokalemia 10/22/2020   Encounter for general adult medical examination with abnormal findings 08/05/2020   Rising PSA level 08/04/2020   Stage 3a chronic kidney disease (HCC) 08/04/2020   Prediabetes 08/03/2020   Primary hypertension 08/03/2020   Benign prostatic hyperplasia without lower urinary tract symptoms 08/03/2020   Hyperlipidemia with target LDL less than 130 08/03/2020   Primary osteoarthritis of left knee 04/16/2019   Primary osteoarthritis of right knee 02/20/2019   Exercise-induced asthma 07/21/2014   Chronic cough 07/21/2014   Upper airway cough syndrome 07/21/2014   Allergic rhinitis 07/21/2014   Lumbar stenosis 11/06/2013   Urethral stricture 01/07/2013   VOCAL CORD DISORDER 02/19/2009   Asthma 01/27/2009   GERD 01/27/2009    PCP: Corliss Blacker, MD   REFERRING PROVIDER: Cristie Hem, PA-C Ref Provider  REFERRING DIAG:  Diagnosis  [phone] (ICD-10-CM) - Status post total right knee replacement    THERAPY DIAG:  Acute pain of right knee  Difficulty in walking, not elsewhere classified  Muscle weakness (generalized)  Stiffness of right knee, not elsewhere classified  Localized edema  Rationale for Evaluation and Treatment: Rehabilitation  ONSET DATE: Rt TKA 02/20/23  SUBJECTIVE:   SUBJECTIVE STATEMENT: Relays  knee is doing well today  PERTINENT HISTORY:02/20/2023 for R TKA. PMH includes back surgery, asthma, GERD, ureteral stricture, HTN, BPH, HLD, CKD III, LT TKA 2021  PAIN:  Are you having pain? Yes: NPRS scale: 4 depending on the activity/10 Pain location: R LE in shin/calf/ankle  Pain description: ache, throbbing and sore  Aggravating factors: activity, touching it  Relieving factors: ice, meds  PRECAUTIONS: None  RED FLAGS: None   WEIGHT BEARING RESTRICTIONS: No  FALLS:  Has patient fallen in last 6 months? No  PLOF: Independent  PATIENT GOALS: reduce pain, and get back to normal   OBJECTIVE:   DIAGNOSTIC FINDINGS: pt relays [place] was negative for blood clot   PATIENT SURVEYS:  Eval: FOTO 35% functional, goal is 53%  COGNITION: Overall cognitive status: Within functional limits for tasks assessed     EDEMA:  Eval: Moderate edema  in Rt knee  MUSCLE LENGTH: Eval: gastroc and Hamstrings: Right are very tight    LOWER EXTREMITY ROM:  Active ROM/PROM Right eval 03/20/23  Hip flexion    Hip extension    Hip abduction    Hip adduction    Hip internal rotation    Hip external rotation    Knee flexion A:100 P:110 P:112  Knee extension A:15 P:10 A:10 P:6   Ankle dorsiflexion    Ankle plantarflexion    Ankle inversion    Ankle eversion     (Blank rows = not tested)  LOWER EXTREMITY MMT:  MMT Right Tested in sitting eval   Hip flexion 4   Hip extension    Hip abduction 5   Hip adduction    Hip internal rotation    Hip external rotation    Knee flexion 4   Knee extension 4   Ankle dorsiflexion    Ankle plantarflexion    Ankle inversion    Ankle eversion     (Blank rows = not tested)   FUNCTIONAL TESTS:  Eval:Sit to stand: can stand without hands but less weight shift to Rt LE  GAIT: Eval Comments: uses SPC currently for ambulation, slower velocity. PLOF is no AD for gait   TODAY'S TREATMENT:  03/29/23 TherEX Recumbent bike seat #6 L2 X 8  min Gastroc stretches R LE 3x30 seconds with slantboard Heel and toe raises X 20 Step ups 6 inch step leading with Rt leg up then down in front with left leg first, no UE support X 15 forward  Lateral step ups and overs 6inch step X 10 bilat with UE support Shuttle: BLEs 81# 2 x15, R LE only 43# 2 X 15 Seated knee extension machine Rt leg only 10# 2X10, then DL up with Rt only down 82# 2X10 Seated knee flexion machine DL 95# [id number] Seated knee flexion AAROM 15x5 second holds  HS stretches 3x30 seconds R LE Knee extension stretches heel prop on bolster first 3 minutes of vaso Manual Knee extension OP supine to tolerance  Modalities Vasopnuematic device X 10 min, medium compression, 34 deg to Rt knee  03/27/23 TherEX Recumbent bike seat #6 L2 X 8 min Gastroc stretches R LE 3x30 seconds with slantboard Heel and toe raises X 20 Step ups 6 inch step leading with Rt leg, no UE support X 15 forward and X 15 lateral Shuttle: BLEs 75# 2 x15, R LE only 37# 2 X 15 Seated knee extension machine Rt leg only 5# (increased to 10# next time) 2X10, then DL up with Rt only down 08# 2X10 Seated knee flexion machine DL 65# [id number] Seated knee flexion AAROM 15x5 second holds  HS stretches 3x20 seconds R LE Knee extension stretches heel prop on bolster first 3 minutes of vaso Manual Knee extension OP supine to tolerance  Modalities Vasopnuematic device X 10 min, medium compression, 34 deg to Rt knee    PATIENT EDUCATION: Education details: HEP, PT plan of care Person educated: Patient Education method: Explanation, Demonstration, Verbal cues, and Handouts Education comprehension: verbalized understanding and needs further education   HOME EXERCISE PROGRAM: Access Code: [email or web address] URL: [email or web address] Date: 03/13/2023 Prepared by: Ivery Quale  Exercises - Seated Hamstring Stretch  - 2 x daily - 6 x weekly - 1 sets - 3 reps - 30 hold - Seated Quad Set  - 2 x daily - 6 x weekly  - 1 sets - 15 reps - 5 sec hold - Seated Straight Leg  Raise with Quad Contraction  - 2 x daily - 6 x weekly - 2-3 sets - 10 reps - Seated Knee Flexion Stretch  - 2 x daily - 6 x weekly - 1-2 sets - 10 reps - 5 sec hold - Seated Knee Extension Stretch with Chair  - 2 x daily - 6 x weekly - 2-3 sets - 2-3 min hold - Sit to Stand  - 2 x daily - 6 x weekly - 1-2 sets - 10 reps - Standing Gastroc Stretch at [employer] (Mirrored)  - 2 x daily - 6 x weekly - 1 sets - 3 reps - 30 hold  ASSESSMENT:  CLINICAL IMPRESSION:  He was again able to add more resistance with strengthening showing good overall strength gains. He will see MD after next visit so we will document overall progress note next visit.    OBJECTIVE IMPAIRMENTS: decreased activity tolerance, difficulty walking, decreased balance, decreased endurance, decreased mobility, decreased ROM, decreased strength, impaired flexibility, impaired LE use, and pain.  ACTIVITY LIMITATIONS: bending, lifting, carry, locomotion, cleaning, community activity, driving  PERSONAL FACTORS: see above PMH are also affecting patient's functional outcome.  REHAB POTENTIAL: Good  CLINICAL DECISION MAKING: Stable/uncomplicated  EVALUATION COMPLEXITY: Low    GOALS: Short term PT Goals Target date: 04/10/2023   Pt will be I and compliant with HEP. Baseline:  Goal status: MET 03/27/23 Pt will decrease pain by 25% overall Baseline:6/10 Goal status: Met 03/27/23  Long term PT goals Target date:05/08/2023   Pt will improve Rt knee PROM 3-120 deg to improve functional mobility Baseline: Goal status: New Pt will improve  Rt hip/knee strength to at least 5-/5 MMT to improve functional strength Baseline: Goal status: New Pt will improve FOTO to at least 53% functional to show improved function Baseline: Goal status: New  Pt will reduce pain to overall less than 2-3/10 with usual activity and work activity. Baseline: Goal status: New Pt will be able to  ambulate community distances at least 1000 ft WNL gait pattern without complaints Baseline: Goal status: New  PLAN: PT FREQUENCY: 1-3 times per week   PT DURATION: 4-8 weeks  PLANNED INTERVENTIONS (unless contraindicated): aquatic PT, Canalith repositioning, cryotherapy, Electrical stimulation, Iontophoresis with 4 mg/ml dexamethasome, Moist heat, traction, Ultrasound, gait training, Therapeutic exercise, balance training, neuromuscular re-education, patient/family education, prosthetic training, manual techniques, passive ROM, dry needling, taping, vasopnuematic device, vestibular, spinal manipulations, joint manipulations  PLAN FOR NEXT SESSION: extension ROM focus and quad strengthening to tolerance Update measurements for MD note next visit  NEXT MD VISIT: 04/04/23  Ivery Quale, PT, DPT 03/29/23 10:08 AM

## 2023-04-04 ENCOUNTER — Encounter: Payer: TRICARE For Life (TFL) | Admitting: Orthopaedic Surgery

## 2023-04-04 ENCOUNTER — Encounter: Payer: TRICARE For Life (TFL) | Admitting: Physical Therapy

## 2023-04-06 ENCOUNTER — Ambulatory Visit (INDEPENDENT_AMBULATORY_CARE_PROVIDER_SITE_OTHER): Payer: Medicare PPO | Admitting: Physical Therapy

## 2023-04-06 ENCOUNTER — Encounter: Payer: Self-pay | Admitting: Physical Therapy

## 2023-04-06 DIAGNOSIS — M25561 Pain in right knee: Secondary | ICD-10-CM | POA: Diagnosis not present

## 2023-04-06 DIAGNOSIS — R262 Difficulty in walking, not elsewhere classified: Secondary | ICD-10-CM

## 2023-04-06 DIAGNOSIS — M25661 Stiffness of right knee, not elsewhere classified: Secondary | ICD-10-CM | POA: Diagnosis not present

## 2023-04-06 DIAGNOSIS — M6281 Muscle weakness (generalized): Secondary | ICD-10-CM

## 2023-04-06 DIAGNOSIS — R600 Localized edema: Secondary | ICD-10-CM

## 2023-04-06 NOTE — Therapy (Addendum)
 OUTPATIENT PHYSICAL THERAPY LOWER EXTREMITY TREATMENT  Referring diagnosis? W11.914 Treatment diagnosis? (if different than referring diagnosis) M25.561 What was this (referring dx) caused by? [x]  Surgery []  Fall []  Ongoing issue []  Arthritis []  Other: ____________  Laterality: [x]  Rt []  Lt []  Both  Check all possible CPT codes:  *CHOOSE 10 OR LESS*    []  97110 (Therapeutic Exercise)  []  92507 (SLP Treatment)  []  78295 (Neuro Re-ed)   []  92526 (Swallowing Treatment)   []  97116 (Gait Training)   []  [id number] (Cognitive Training, 1st 15 minutes) []  97140 (Manual Therapy)   []  97130 (Cognitive Training, each add'l 15 minutes)  []  97164 (Re-evaluation)                              []  Other, List CPT Code ____________  []  97530 (Therapeutic Activities)     []  97535 (Self Care)   [x]  All codes above (97110 - 97535)  []  97012 (Mechanical Traction)  []  97014 (E-stim Unattended)  []  97032 (E-stim manual)  []  97033 (Ionto)  []  97035 (Ultrasound) []  97750 (Physical Performance Training) []  [id number] (Aquatic Therapy) [x]  97016 (Vasopneumatic Device) []  [id number] (Paraffin) []  97034 (Contrast Bath) []  97597 (Wound Care 1st 20 sq cm) []  97598 (Wound Care each add'l 20 sq cm) []  97760 (Orthotic Fabrication, Fitting, Training Initial) []  [id number] (Prosthetic Management and Training Initial) []  [id number] (Orthotic or Prosthetic Training/ Modification Subsequent)  Patient Name: AARONMICHAEL BRUMBAUGH MRN: [id number] DOB:03/24/1956, 67 y.o., male Today's Date: 04/06/2023  END OF SESSION:  PT End of Session - 04/06/23 1015     Visit Number 7    Number of Visits 15    Date for PT Re-Evaluation 05/08/23    Authorization Type tric[employer], human MCR    Progress Note Due on Visit 10    PT Start Time 1010    PT Stop Time 1050    PT Time Calculation (min) 40 min    Activity Tolerance Patient tolerated treatment well    Behavior During Therapy WFL for tasks assessed/performed               Past Medical  History:  Diagnosis Date   Allergy    Arthritis    "everywhere"   BPH (benign prostatic hypertrophy)    Chronic rhinitis 01/27/2009   ED (erectile dysfunction)    GERD (gastroesophageal reflux disease)    Headache(784.0)    migraines, as many as 3 in a week     HYPERLIPIDEMIA 01/27/2009   HYPERTENSION 01/27/2009   Mild asthma    seasonal allergies, uses Proair once per yr.    Mild obstructive sleep apnea    per pt -- mild osa test result--  no rx cpap needed   Neuromuscular disorder (HCC)    Sleep apnea    no cpap   SOB (shortness of breath)    Testosterone deficiency    Urethral stricture    Vocal cord anomaly    pt states told from [place]  doctor heard a little gurgle sound -- no farther testing done   Past Surgical History:  Procedure Laterality Date   BACK SURGERY     CARPAL TUNNEL RELEASE Bilateral    Dr Roda Shutters   COLONOSCOPY     CYSTOSCOPY WITH URETHRAL DILATATION N/A 01/07/2013   Procedure: CYSTOSCOPY WITH URETHRAL DILATATION BALLOON DILATION ;  Surgeon: Valetta Fuller, MD;  [facility];  Service: Urology;  Laterality: N/A;  LUMBAR LAMINECTOMY/DECOMPRESSION MICRODISCECTOMY Right 11/29/2012   Procedure: Right Lumbar Four to Five Laminectomy/ Decompression Microdiskectomy;  Surgeon: Clydene Fake, MD;  Location: [facility];  Service: Neurosurgery;  Laterality: Right;  LUMBAR LAMINECTOMY/DECOMPRESSION MICRODISCECTOMY 1 LEVEL   POLYPECTOMY     SHOULDER ARTHROSCOPY W/ ACROMIAL REPAIR Left 08/28/2002   TOTAL KNEE ARTHROPLASTY Left 12/02/2019   TOTAL KNEE ARTHROPLASTY Left 12/02/2019   Procedure: LEFT TOTAL KNEE ARTHROPLASTY;  Surgeon: Tarry Kos, MD;  Location: [facility];  Service: Orthopedics;  Laterality: Left;   TOTAL KNEE ARTHROPLASTY Right 02/20/2023   Procedure: RIGHT TOTAL KNEE ARTHROPLASTY;  Surgeon: Tarry Kos, MD;  Location: [facility];  Service: Orthopedics;  Laterality: Right;   UPPER GASTROINTESTINAL ENDOSCOPY     Patient Active Problem List    Diagnosis Date Noted   Status post total right knee replacement 02/20/2023   Pain in left ankle and joints of left foot 07/20/2022   Primary osteoarthritis of first carpometacarpal joint of right hand 07/20/2022   Right carpal tunnel syndrome 11/11/2021   Acute asthmatic bronchitis 06/14/2021   Other headache syndrome 05/17/2021   Body aches 05/17/2021   Acute cough 05/17/2021   Shortness of breath 05/17/2021   Influenza A 05/17/2021   Arthritis of carpometacarpal (CMC) joint of right thumb 04/05/2021   Bilateral hand numbness 04/05/2021   Type II diabetes mellitus with manifestations (HCC) 03/18/2021   Diuretic-induced hypokalemia 10/22/2020   Encounter for general adult medical examination with abnormal findings 08/05/2020   Rising PSA level 08/04/2020   Stage 3a chronic kidney disease (HCC) 08/04/2020   Prediabetes 08/03/2020   Primary hypertension 08/03/2020   Benign prostatic hyperplasia without lower urinary tract symptoms 08/03/2020   Hyperlipidemia with target LDL less than 130 08/03/2020   Primary osteoarthritis of left knee 04/16/2019   Primary osteoarthritis of right knee 02/20/2019   Exercise-induced asthma 07/21/2014   Chronic cough 07/21/2014   Upper airway cough syndrome 07/21/2014   Allergic rhinitis 07/21/2014   Lumbar stenosis 11/06/2013   Urethral stricture 01/07/2013   VOCAL CORD DISORDER 02/19/2009   Asthma 01/27/2009   GERD 01/27/2009    PCP: Corliss Blacker, MD   REFERRING PROVIDER: Cristie Hem, PA-C Ref Provider  REFERRING DIAG:  Diagnosis  [id number] (ICD-10-CM) - Status post total right knee replacement    THERAPY DIAG:  Acute pain of right knee  Difficulty in walking, not elsewhere classified  Muscle weakness (generalized)  Stiffness of right knee, not elsewhere classified  Localized edema  Rationale for Evaluation and Treatment: Rehabilitation  ONSET DATE: Rt TKA 02/20/23  SUBJECTIVE:   SUBJECTIVE STATEMENT: Relays  knee pain doing good but does get a twinge of pain in medial knee that only happens at certain times.   PERTINENT HISTORY:02/20/2023 for R TKA. PMH includes back surgery, asthma, GERD, ureteral stricture, HTN, BPH, HLD, CKD III, LT TKA 2021  PAIN:  Are you having pain? Yes: NPRS scale: 2 depending on the activity/10 Pain location: R LE in shin/calf/ankle  Pain description: ache, throbbing and sore  Aggravating factors: activity, touching it  Relieving factors: ice, meds  PRECAUTIONS: None  RED FLAGS: None   WEIGHT BEARING RESTRICTIONS: No  FALLS:  Has patient fallen in last 6 months? No  PLOF: Independent  PATIENT GOALS: reduce pain, and get back to normal   OBJECTIVE:   DIAGNOSTIC FINDINGS: pt relays [place] was negative for blood clot   PATIENT SURVEYS:  Eval: FOTO 35% functional, goal is 53% 04/06/23: FOTO improved to  65% and met goal for this  COGNITION: Overall cognitive status: Within functional limits for tasks assessed     EDEMA:  Eval: Moderate edema in Rt knee  MUSCLE LENGTH: Eval: gastroc and Hamstrings: Right are very tight    LOWER EXTREMITY ROM:  Active ROM/PROM Right eval 03/20/23 04/06/23  Hip flexion     Hip extension     Hip abduction     Hip adduction     Hip internal rotation     Hip external rotation     Knee flexion A:100 P:110 P:112 A:110 P:115  Knee extension A:15 P:10 A:10 P:6  A:5 P:3  Ankle dorsiflexion     Ankle plantarflexion     Ankle inversion     Ankle eversion      (Blank rows = not tested)  LOWER EXTREMITY MMT:  MMT Right Tested in sitting eval Right 04/06/23 Tested in sitting  Hip flexion 4 5  Hip extension    Hip abduction 5 5  Hip adduction    Hip internal rotation    Hip external rotation    Knee flexion 4 5  Knee extension 4 5  Ankle dorsiflexion    Ankle plantarflexion    Ankle inversion    Ankle eversion     (Blank rows = not tested)   FUNCTIONAL TESTS:  Eval:Sit to stand: can stand without  hands but less weight shift to Rt LE  GAIT: Eval Comments: uses SPC currently for ambulation, slower velocity. PLOF is no AD for gait   TODAY'S TREATMENT:  04/06/23 TherEX Recumbent bike seat #6 L3 X 8 min Gastroc stretches R LE 3x30 seconds with slantboard Step downs from 6 inch step (leading with Lt) 2X10 Step ups 6 inch step leading with Rt leg up then down behind with left leg first, no UE support X 15 forward  Lateral step ups and overs 6inch step X 10 bilat without UE support Shuttle: BLEs 93# 3 x10, R LE only 43# 2 X 15 Seated knee extension machine Rt leg only 10# 2X10,  Seated knee flexion machine DL 60# [id number] Sit to stands no UE support power up and slow eccentric lower 2X10 HS stretches 3x30 seconds R LE Knee extension stretches heel prop on bolster first 3 minutes of vaso Manual Knee extension OP supine to tolerance   03/29/23 TherEX Recumbent bike seat #6 L2 X 8 min Gastroc stretches R LE 3x30 seconds with slantboard Heel and toe raises X 20 Step ups 6 inch step leading with Rt leg up then down in front with left leg first, no UE support X 15 forward  Lateral step ups and overs 6inch step X 10 bilat with UE support Shuttle: BLEs 81# 2 x15, R LE only 43# 2 X 15 Seated knee extension machine Rt leg only 10# 2X10, then DL up with Rt only down 98# 2X10 Seated knee flexion machine DL 11# 9J47 Seated knee flexion AAROM 15x5 second holds  HS stretches 3x30 seconds R LE Knee extension stretches heel prop on bolster first 3 minutes of vaso Manual Knee extension OP supine to tolerance  Modalities Vasopnuematic device X 10 min, medium compression, 34 deg to Rt knee  03/27/23 TherEX Recumbent bike seat #6 L2 X 8 min Gastroc stretches R LE 3x30 seconds with slantboard Heel and toe raises X 20 Step ups 6 inch step leading with Rt leg, no UE support X 15 forward and X 15 lateral Shuttle: BLEs 75# 2 x15, R LE only 37#  2 X 15 Seated knee extension machine Rt leg only 5#  (increased to 10# next time) 2X10, then DL up with Rt only down 29# 2X10 Seated knee flexion machine DL 56# 2Z30 Seated knee flexion AAROM 15x5 second holds  HS stretches 3x20 seconds R LE Knee extension stretches heel prop on bolster first 3 minutes of vaso Manual Knee extension OP supine to tolerance  Modalities Vasopnuematic device X 10 min, medium compression, 34 deg to Rt knee    PATIENT EDUCATION: Education details: HEP, PT plan of care Person educated: Patient Education method: Explanation, Demonstration, Verbal cues, and Handouts Education comprehension: verbalized understanding and needs further education   HOME EXERCISE PROGRAM: Access Code: [email or web address] URL: [email or web address] Date: 03/13/2023 Prepared by: Ivery Quale  Exercises - Seated Hamstring Stretch  - 2 x daily - 6 x weekly - 1 sets - 3 reps - 30 hold - Seated Quad Set  - 2 x daily - 6 x weekly - 1 sets - 15 reps - 5 sec hold - Seated Straight Leg Raise with Quad Contraction  - 2 x daily - 6 x weekly - 2-3 sets - 10 reps - Seated Knee Flexion Stretch  - 2 x daily - 6 x weekly - 1-2 sets - 10 reps - 5 sec hold - Seated Knee Extension Stretch with Chair  - 2 x daily - 6 x weekly - 2-3 sets - 2-3 min hold - Sit to Stand  - 2 x daily - 6 x weekly - 1-2 sets - 10 reps - Standing Gastroc Stretch at [employer] (Mirrored)  - 2 x daily - 6 x weekly - 1 sets - 3 reps - 30 hold  ASSESSMENT:  CLINICAL IMPRESSION:  He has done well with PT and now has met all PT goals except barely missed his ROM goal. He does still have good ROM measured 3-115 deg today. He will likely be ready to finish up with PT next week or if he feels ready after meeting with MD tomorrow then we will DC early.   OBJECTIVE IMPAIRMENTS: decreased activity tolerance, difficulty walking, decreased balance, decreased endurance, decreased mobility, decreased ROM, decreased strength, impaired flexibility, impaired LE use, and pain.  ACTIVITY  LIMITATIONS: bending, lifting, carry, locomotion, cleaning, community activity, driving  PERSONAL FACTORS: see above PMH are also affecting patient's functional outcome.  REHAB POTENTIAL: Good  CLINICAL DECISION MAKING: Stable/uncomplicated  EVALUATION COMPLEXITY: Low    GOALS: Short term PT Goals Target date: 04/10/2023   Pt will be I and compliant with HEP. Baseline:  Goal status: MET 03/27/23 Pt will decrease pain by 25% overall Baseline:6/10 Goal status: Met 03/27/23  Long term PT goals Target date:05/08/2023   Pt will improve Rt knee PROM 3-120 deg to improve functional mobility Baseline: Goal status: mostly met, had 3-115 on 10/10 Pt will improve  Rt hip/knee strength to at least 5-/5 MMT to improve functional strength Baseline: Goal status: MET 10/10 Pt will improve FOTO to at least 53% functional to show improved function Baseline: Goal status: MET 10/10  Pt will reduce pain to overall less than 2-3/10 with usual activity and work activity. Baseline: Goal status: MET 10/10 Pt will be able to ambulate community distances at least 1000 ft WNL gait pattern without complaints Baseline: Goal status: met 10/10  PLAN: PT FREQUENCY: 1-3 times per week   PT DURATION: 4-8 weeks  PLANNED INTERVENTIONS (unless contraindicated): aquatic PT, Canalith repositioning, cryotherapy, Electrical stimulation, Iontophoresis with 4 mg/ml dexamethasome, Moist heat, traction, Ultrasound,  gait training, Therapeutic exercise, balance training, neuromuscular re-education, patient/family education, prosthetic training, manual techniques, passive ROM, dry needling, taping, vasopnuematic device, vestibular, spinal manipulations, joint manipulations  PLAN FOR NEXT SESSION: DC if he feels ready next week  Ivery Quale, PT, DPT 04/06/23 10:53 AM  PHYSICAL THERAPY DISCHARGE SUMMARY  Visits from Start of Care: 7  Current functional level related to goals / functional outcomes: See  above   Remaining deficits: See above   Education / Equipment: HEP  Plan:  Patient goals were met. Patient is being discharged due to not returning since last visit >30 days   Ivery Quale, PT, DPT 09/04/23 1:35 PM

## 2023-04-07 ENCOUNTER — Encounter: Payer: Self-pay | Admitting: Orthopaedic Surgery

## 2023-04-07 ENCOUNTER — Ambulatory Visit: Payer: TRICARE For Life (TFL) | Admitting: Physician Assistant

## 2023-04-07 ENCOUNTER — Other Ambulatory Visit (INDEPENDENT_AMBULATORY_CARE_PROVIDER_SITE_OTHER): Payer: Medicare PPO

## 2023-04-07 DIAGNOSIS — Z96651 Presence of right artificial knee joint: Secondary | ICD-10-CM

## 2023-04-07 NOTE — Progress Notes (Signed)
Post-Op Visit Note   Patient: Douglas Carpenter           Date of Birth: 06-11-1956           MRN: [id number] Visit Date: 04/07/2023 PCP: Corliss Blacker, MD   Assessment & Plan:  Chief Complaint:  Chief Complaint  Patient presents with   Right Knee - Follow-up    Right total knee arthroplasty 02/20/2023   Visit Diagnoses:  1. Status post total right knee replacement     Plan: Patient is a pleasant 67 year old gentleman who comes in today 6 weeks status post right total knee replacement 02/20/2023.  He has been doing well.  He is not taking anything for pain.  He has been compliant taking a baby aspirin twice daily for the past 4 weeks.  He has been in physical therapy and has 2 visits remaining.  Overall, doing well.  Examination of the right knee reveals a well-healed surgical scar without complication.  Calf is soft nontender.  Stable valgus varus stress.  Range of motion 5 to 115 degrees.  He is neurovascularly intact distally.  At this point, he will finish out his remaining physical therapy visits.  He will continue to advance with activity as tolerated.  He may back down to 1 baby aspirin daily for which she was taking prior to surgery.  Dental prophylaxis reinforced.  Follow-up in 6 weeks for recheck.  Call with concerns or questions.  Follow-Up Instructions: Return in about 6 weeks (around 05/19/2023).   Orders:  Orders Placed This Encounter  Procedures   XR Knee 1-2 Views Right   No orders of the defined types were placed in this encounter.   Imaging: XR Knee 1-2 Views Right  Result Date: 04/07/2023 Well seated prosthesis without complication   PMFS History: Patient Active Problem List   Diagnosis Date Noted   Status post total right knee replacement 02/20/2023   Pain in left ankle and joints of left foot 07/20/2022   Primary osteoarthritis of first carpometacarpal joint of right hand 07/20/2022   Right carpal tunnel syndrome 11/11/2021   Acute asthmatic  bronchitis 06/14/2021   Other headache syndrome 05/17/2021   Body aches 05/17/2021   Acute cough 05/17/2021   Shortness of breath 05/17/2021   Influenza A 05/17/2021   Arthritis of carpometacarpal (CMC) joint of right thumb 04/05/2021   Bilateral hand numbness 04/05/2021   Type II diabetes mellitus with manifestations (HCC) 03/18/2021   Diuretic-induced hypokalemia 10/22/2020   Encounter for general adult medical examination with abnormal findings 08/05/2020   Rising PSA level 08/04/2020   Stage 3a chronic kidney disease (HCC) 08/04/2020   Prediabetes 08/03/2020   Primary hypertension 08/03/2020   Benign prostatic hyperplasia without lower urinary tract symptoms 08/03/2020   Hyperlipidemia with target LDL less than 130 08/03/2020   Primary osteoarthritis of left knee 04/16/2019   Primary osteoarthritis of right knee 02/20/2019   Exercise-induced asthma 07/21/2014   Chronic cough 07/21/2014   Upper airway cough syndrome 07/21/2014   Allergic rhinitis 07/21/2014   Lumbar stenosis 11/06/2013   Urethral stricture 01/07/2013   VOCAL CORD DISORDER 02/19/2009   Asthma 01/27/2009   GERD 01/27/2009   Past Medical History:  Diagnosis Date   Allergy    Arthritis    "everywhere"   BPH (benign prostatic hypertrophy)    Chronic rhinitis 01/27/2009   ED (erectile dysfunction)    GERD (gastroesophageal reflux disease)    Headache(784.0)    migraines, as many as 3  in a week     HYPERLIPIDEMIA 01/27/2009   HYPERTENSION 01/27/2009   Mild asthma    seasonal allergies, uses Proair once per yr.    Mild obstructive sleep apnea    per pt -- mild osa test result--  no rx cpap needed   Neuromuscular disorder (HCC)    Sleep apnea    no cpap   SOB (shortness of breath)    Testosterone deficiency    Urethral stricture    Vocal cord anomaly    pt states told from [place]  doctor heard a little gurgle sound -- no farther testing done    Family History  Problem Relation Age of Onset   Cancer  Mother        lung   Lung cancer Mother    Heart disease Maternal Grandmother    Heart disease Maternal Grandfather    Cancer Maternal Grandfather        colon, prostate,lung   Colon cancer Neg Hx    Colon polyps Neg Hx    Esophageal cancer Neg Hx    Rectal cancer Neg Hx    Stomach cancer Neg Hx     Past Surgical History:  Procedure Laterality Date   BACK SURGERY     CARPAL TUNNEL RELEASE Bilateral    Dr Roda Shutters   COLONOSCOPY     CYSTOSCOPY WITH URETHRAL DILATATION N/A 01/07/2013   Procedure: CYSTOSCOPY WITH URETHRAL DILATATION BALLOON DILATION ;  Surgeon: Valetta Fuller, MD;  Location: [facility];  Service: Urology;  Laterality: N/A;   LUMBAR LAMINECTOMY/DECOMPRESSION MICRODISCECTOMY Right 11/29/2012   Procedure: Right Lumbar Four to Five Laminectomy/ Decompression Microdiskectomy;  Surgeon: Clydene Fake, MD;  Location: [facility];  Service: Neurosurgery;  Laterality: Right;  LUMBAR LAMINECTOMY/DECOMPRESSION MICRODISCECTOMY 1 LEVEL   POLYPECTOMY     SHOULDER ARTHROSCOPY W/ ACROMIAL REPAIR Left 08/28/2002   TOTAL KNEE ARTHROPLASTY Left 12/02/2019   TOTAL KNEE ARTHROPLASTY Left 12/02/2019   Procedure: LEFT TOTAL KNEE ARTHROPLASTY;  Surgeon: Tarry Kos, MD;  Location: [facility];  Service: Orthopedics;  Laterality: Left;   TOTAL KNEE ARTHROPLASTY Right 02/20/2023   Procedure: RIGHT TOTAL KNEE ARTHROPLASTY;  Surgeon: Tarry Kos, MD;  Location: [facility];  Service: Orthopedics;  Laterality: Right;   UPPER GASTROINTESTINAL ENDOSCOPY     Social History   Occupational History   Occupation: retired  Tobacco Use   Smoking status: Former    Current packs/day: 0.00    Average packs/day: 0.5 packs/day for 22.0 years (11.0 ttl pk-yrs)    Types: Cigarettes    Start date: 06/27/1966    Quit date: 06/27/1988    Years since quitting: 34.8   Smokeless tobacco: Never  Vaping Use   Vaping status: Never Used  Substance and Sexual Activity   Alcohol use: Not Currently    Comment:  cognac (has 2-3 drinks for 2-3 days a week)   Drug use: No   Sexual activity: Yes    Partners: Female

## 2023-04-10 ENCOUNTER — Encounter: Payer: TRICARE For Life (TFL) | Admitting: Physical Therapy

## 2023-04-12 ENCOUNTER — Encounter: Payer: TRICARE For Life (TFL) | Admitting: Physical Therapy

## 2023-05-23 ENCOUNTER — Ambulatory Visit (AMBULATORY_SURGERY_CENTER): Payer: Medicare PPO

## 2023-05-23 VITALS — Ht 67.0 in | Wt 199.2 lb

## 2023-05-23 DIAGNOSIS — Z860100 Personal history of colon polyps, unspecified: Secondary | ICD-10-CM

## 2023-05-23 MED ORDER — NA SULFATE-K SULFATE-MG SULF 17.5-3.13-1.6 GM/177ML PO SOLN: 1.0000 | Freq: Once | ORAL | 0 refills | Status: AC

## 2023-05-23 NOTE — Progress Notes (Signed)

## 2023-06-08 ENCOUNTER — Telehealth: Payer: Self-pay | Admitting: Gastroenterology

## 2023-06-08 ENCOUNTER — Other Ambulatory Visit: Payer: Self-pay

## 2023-06-08 NOTE — Telephone Encounter (Signed)
Spoke with patient. Made him aware [facility] covers Golytely as well as fluid amount for prep. Pt has goodrx coupon advised patient to present at time of pick up for a discount. Pt ok to proceed using suprep solution.

## 2023-06-08 NOTE — Telephone Encounter (Signed)
Inbound call from patients wife requesting prep medication be sent to [facility]. [facility] is going to charge them for it, and patient can get it for free through the [place]. Please advise.

## 2023-06-26 ENCOUNTER — Encounter: Payer: Self-pay | Admitting: Gastroenterology

## 2023-06-26 ENCOUNTER — Ambulatory Visit: Payer: Medicare PPO | Admitting: Gastroenterology

## 2023-06-26 VITALS — BP 106/66 | HR 92 | Temp 98.7°F | Resp 20 | Ht 67.0 in | Wt 199.2 lb

## 2023-06-26 DIAGNOSIS — K5730 Diverticulosis of large intestine without perforation or abscess without bleeding: Secondary | ICD-10-CM

## 2023-06-26 DIAGNOSIS — K621 Rectal polyp: Secondary | ICD-10-CM | POA: Diagnosis not present

## 2023-06-26 DIAGNOSIS — K644 Residual hemorrhoidal skin tags: Secondary | ICD-10-CM

## 2023-06-26 DIAGNOSIS — D128 Benign neoplasm of rectum: Secondary | ICD-10-CM

## 2023-06-26 DIAGNOSIS — D122 Benign neoplasm of ascending colon: Secondary | ICD-10-CM

## 2023-06-26 DIAGNOSIS — K648 Other hemorrhoids: Secondary | ICD-10-CM

## 2023-06-26 DIAGNOSIS — Z1211 Encounter for screening for malignant neoplasm of colon: Secondary | ICD-10-CM | POA: Diagnosis not present

## 2023-06-26 DIAGNOSIS — Z860100 Personal history of colon polyps, unspecified: Secondary | ICD-10-CM

## 2023-06-26 DIAGNOSIS — D123 Benign neoplasm of transverse colon: Secondary | ICD-10-CM | POA: Diagnosis not present

## 2023-06-26 DIAGNOSIS — D120 Benign neoplasm of cecum: Secondary | ICD-10-CM

## 2023-06-26 MED ORDER — SODIUM CHLORIDE 0.9 % IV SOLN: 500.0000 mL | Freq: Once | INTRAVENOUS | Status: DC

## 2023-06-26 NOTE — Progress Notes (Signed)
Sedate, gd SR, tolerated procedure well, VSS, report to RN 

## 2023-06-26 NOTE — Op Note (Signed)
[facility] Patient Name: Douglas Carpenter Procedure Date: 06/26/2023 8:09 AM MRN: [id number] Endoscopist: Napoleon Form , MD, [id number] Age: 67 Referring MD:  Date of Birth: April 02, 1956 Gender: Male Account #: [id number] Procedure:                Colonoscopy Indications:              Screening in patient at increased risk: Family                            history of 1st-degree relative with colorectal                            cancer before age 46 years, High risk colon cancer                            surveillance: Personal history of colonic polyps Medicines:                Monitored Anesthesia Care Procedure:                Pre-Anesthesia Assessment:                           - Prior to the procedure, a History and Physical                            was performed, and patient medications and                            allergies were reviewed. The patient's tolerance of                            previous anesthesia was also reviewed. The risks                            and benefits of the procedure and the sedation                            options and risks were discussed with the patient.                            All questions were answered, and informed consent                            was obtained. Prior Anticoagulants: The patient has                            taken no anticoagulant or antiplatelet agents. ASA                            Grade Assessment: II - A patient with mild systemic                            disease. After reviewing the risks and benefits,  the patient was deemed in satisfactory condition to                            undergo the procedure.                           After obtaining informed consent, the colonoscope                            was passed under direct vision. Throughout the                            procedure, the patient's blood pressure, pulse, and                            oxygen  saturations were monitored continuously. The                            Olympus Scope SN: [id number] was introduced through                            the anus and advanced to the the cecum, identified                            by the appendiceal orifice. The colonoscopy was                            performed without difficulty. The patient tolerated                            the procedure well. The quality of the bowel                            preparation was good. The ileocecal valve,                            appendiceal orifice, and rectum were photographed. Scope In: 8:12:45 AM Scope Out: 8:25:42 AM Scope Withdrawal Time: 0 hours 11 minutes 1 second  Total Procedure Duration: 0 hours 12 minutes 57 seconds  Findings:                 The perianal and digital rectal examinations were                            normal.                           Four semi-pedunculated polyps were found in the                            transverse colon, ascending colon and cecum. The                            polyps were 10 to 12 mm in size. These polyps were  removed with a hot snare. Resection and retrieval                            were complete.                           A 4 mm polyp was found in the rectum. The polyp was                            sessile. The polyp was removed with a cold snare.                            Resection and retrieval were complete.                           Scattered large-mouthed, medium-mouthed and                            small-mouthed diverticula were found in the sigmoid                            colon, descending colon, transverse colon and                            ascending colon.                           Non-bleeding external and internal hemorrhoids were                            found during retroflexion. The hemorrhoids were                            medium-sized. Complications:            No immediate  complications. Estimated Blood Loss:     Estimated blood loss was minimal. Impression:               - Four 10 to 12 mm polyps in the transverse colon,                            in the ascending colon and in the cecum, removed                            with a hot snare. Resected and retrieved.                           - One 4 mm polyp in the rectum, removed with a cold                            snare. Resected and retrieved.                           - Moderate diverticulosis in the sigmoid colon, in  the descending colon, in the transverse colon and                            in the ascending colon.                           - Non-bleeding external and internal hemorrhoids. Recommendation:           - Patient has a contact number available for                            emergencies. The signs and symptoms of potential                            delayed complications were discussed with the                            patient. Return to normal activities tomorrow.                            Written discharge instructions were provided to the                            patient.                           - Resume previous diet.                           - Continue present medications.                           - Await pathology results.                           - Repeat colonoscopy in 3 years for surveillance                            based on pathology results. Napoleon Form, MD 06/26/2023 8:33:38 AM This report has been signed electronically.

## 2023-06-26 NOTE — Progress Notes (Signed)
Called to room to assist during endoscopic procedure.  Patient ID and intended procedure confirmed with present staff. Received instructions for my participation in the procedure from the performing physician.  

## 2023-06-26 NOTE — Progress Notes (Signed)
[facility] History and Physical   Primary Care Physician:  Lourena Simmonds, Donald Pore, MD   Reason for Procedure:  History of adenomatous colon polyps  Plan:    Surveillance colonoscopy with possible interventions as needed     HPI: Douglas Carpenter is a very pleasant 67 y.o. male here for surveillance colonoscopy. Denies any nausea, vomiting, abdominal pain, melena or bright red blood per rectum  The risks and benefits as well as alternatives of endoscopic procedure(s) have been discussed and reviewed. All questions answered. The patient agrees to proceed.    Past Medical History:  Diagnosis Date   Allergy    Arthritis    "everywhere"   BPH (benign prostatic hypertrophy)    Chronic rhinitis 01/27/2009   ED (erectile dysfunction)    GERD (gastroesophageal reflux disease)    Headache(784.0)    migraines, as many as 3 in a week     HYPERLIPIDEMIA 01/27/2009   HYPERTENSION 01/27/2009   Mild asthma    seasonal allergies, uses Proair once per yr.    Mild obstructive sleep apnea    per pt -- mild osa test result--  no rx cpap needed   Neuromuscular disorder (HCC)    Sleep apnea    no cpap   SOB (shortness of breath)    Testosterone deficiency    Urethral stricture    Vocal cord anomaly    pt states told from [place]  doctor heard a little gurgle sound -- no farther testing done    Past Surgical History:  Procedure Laterality Date   BACK SURGERY     CARPAL TUNNEL RELEASE Bilateral    Dr Roda Shutters   COLONOSCOPY     CYSTOSCOPY WITH URETHRAL DILATATION N/A 01/07/2013   Procedure: CYSTOSCOPY WITH URETHRAL DILATATION BALLOON DILATION ;  Surgeon: Valetta Fuller, MD;  Location: [facility];  Service: Urology;  Laterality: N/A;   LUMBAR LAMINECTOMY/DECOMPRESSION MICRODISCECTOMY Right 11/29/2012   Procedure: Right Lumbar Four to Five Laminectomy/ Decompression Microdiskectomy;  Surgeon: Clydene Fake, MD;  Location: [facility];  Service: Neurosurgery;  Laterality:  Right;  LUMBAR LAMINECTOMY/DECOMPRESSION MICRODISCECTOMY 1 LEVEL   POLYPECTOMY     SHOULDER ARTHROSCOPY W/ ACROMIAL REPAIR Left 08/28/2002   TOTAL KNEE ARTHROPLASTY Left 12/02/2019   Procedure: LEFT TOTAL KNEE ARTHROPLASTY;  Surgeon: Tarry Kos, MD;  Location: [facility] OR;  Service: Orthopedics;  Laterality: Left;   TOTAL KNEE ARTHROPLASTY Right 02/20/2023   Procedure: RIGHT TOTAL KNEE ARTHROPLASTY;  Surgeon: Tarry Kos, MD;  Location: [facility] OR;  Service: Orthopedics;  Laterality: Right;   UPPER GASTROINTESTINAL ENDOSCOPY      Prior to Admission medications   Medication Sig Start Date End Date Taking? Authorizing Provider  albuterol (PROVENTIL) (2.5 MG/3ML) 0.083% nebulizer solution Take 3 mLs (2.5 mg total) by nebulization every 6 (six) hours as needed for wheezing or shortness of breath. 11/27/20  Yes Coralyn Helling, MD  aspirin EC 81 MG tablet Take 1 tablet (81 mg total) by mouth 2 (two) times daily. To be taken after surgery to prevent blood clots 02/20/23 02/20/24 Yes Cristie Hem, PA-C  atorvastatin (LIPITOR) 80 MG tablet Take 1 tablet (80 mg total) by mouth at bedtime. 08/04/20  Yes Etta Grandchild, MD  carvedilol (COREG) 12.5 MG tablet Take 1 tablet (12.5 mg total) by mouth 2 (two) times daily with a meal. Patient taking differently: Take 6.25 mg by mouth daily. 09/09/21  Yes Etta Grandchild, MD  fluticasone-salmeterol [facility] INHUB) 250-50 MCG/ACT  AEPB Inhale 1 puff into the lungs in the morning and at bedtime. 08/19/21  Yes Coralyn Helling, MD  gabapentin (NEURONTIN) 300 MG capsule Take 300 mg by mouth 3 (three) times daily. 09/18/19  Yes [provider]  indapamide (LOZOL) 2.5 MG tablet Take 1 tablet (2.5 mg total) by mouth daily. 09/09/21  Yes Etta Grandchild, MD  montelukast (SINGULAIR) 10 MG tablet Take 1 tablet (10 mg total) by mouth at bedtime. 07/08/20  Yes Coralyn Helling, MD  omeprazole (PRILOSEC) 40 MG capsule Take 40 mg by mouth daily.  08/21/19  Yes [provider]   potassium chloride SA (KLOR-CON M20) 20 MEQ tablet Take 1 tablet (20 mEq total) by mouth 2 (two) times daily. 09/22/21  Yes Etta Grandchild, MD  ALBUTEROL IN Inhale into the lungs.    [provider]  benzonatate (TESSALON) 100 MG capsule Take 100 mg by mouth 2 (two) times daily.    [provider]  docusate sodium (COLACE) 100 MG capsule Take 1 capsule (100 mg total) by mouth daily as needed. Patient not taking: Reported on 06/26/2023 02/20/23 02/20/24  Cristie Hem, PA-C  Erenumab-aooe (AIMOVIG) 70 MG/ML SOAJ Inject 70 mg into the skin every 30 (thirty) days.    [provider]  methocarbamol (ROBAXIN-750) 750 MG tablet Take 1 tablet (750 mg total) by mouth 2 (two) times daily as needed for muscle spasms. Patient not taking: Reported on 06/26/2023 02/26/23   Cristie Hem, PA-C  ondansetron (ZOFRAN) 4 MG tablet Take 1 tablet (4 mg total) by mouth every 8 (eight) hours as needed for nausea or vomiting. Patient not taking: Reported on 06/26/2023 02/20/23   Cristie Hem, PA-C  terbinafine (LAMISIL) 250 MG tablet Take 1 tablet (250 mg total) by mouth daily. Patient not taking: Reported on 05/23/2023 09/28/22   Lenn Sink, DPM    Current Outpatient Medications  Medication Sig Dispense Refill   albuterol (PROVENTIL) (2.5 MG/3ML) 0.083% nebulizer solution Take 3 mLs (2.5 mg total) by nebulization every 6 (six) hours as needed for wheezing or shortness of breath. 360 mL 5   aspirin EC 81 MG tablet Take 1 tablet (81 mg total) by mouth 2 (two) times daily. To be taken after surgery to prevent blood clots 84 tablet 0   atorvastatin (LIPITOR) 80 MG tablet Take 1 tablet (80 mg total) by mouth at bedtime. 90 tablet 1   carvedilol (COREG) 12.5 MG tablet Take 1 tablet (12.5 mg total) by mouth 2 (two) times daily with a meal. (Patient taking differently: Take 6.25 mg by mouth daily.) 180 tablet 0   fluticasone-salmeterol (WIXELA INHUB) 250-50 MCG/ACT AEPB Inhale 1 puff into  the lungs in the morning and at bedtime. 180 each 3   gabapentin (NEURONTIN) 300 MG capsule Take 300 mg by mouth 3 (three) times daily.     indapamide (LOZOL) 2.5 MG tablet Take 1 tablet (2.5 mg total) by mouth daily. 90 tablet 0   montelukast (SINGULAIR) 10 MG tablet Take 1 tablet (10 mg total) by mouth at bedtime. 30 tablet 11   omeprazole (PRILOSEC) 40 MG capsule Take 40 mg by mouth daily.      potassium chloride SA (KLOR-CON M20) 20 MEQ tablet Take 1 tablet (20 mEq total) by mouth 2 (two) times daily. 180 tablet 0   ALBUTEROL IN Inhale into the lungs.     benzonatate (TESSALON) 100 MG capsule Take 100 mg by mouth 2 (two) times daily.  docusate sodium (COLACE) 100 MG capsule Take 1 capsule (100 mg total) by mouth daily as needed. (Patient not taking: Reported on 06/26/2023) 30 capsule 2   Erenumab-aooe (AIMOVIG) 70 MG/ML SOAJ Inject 70 mg into the skin every 30 (thirty) days.     methocarbamol (ROBAXIN-750) 750 MG tablet Take 1 tablet (750 mg total) by mouth 2 (two) times daily as needed for muscle spasms. (Patient not taking: Reported on 06/26/2023) 20 tablet 2   ondansetron (ZOFRAN) 4 MG tablet Take 1 tablet (4 mg total) by mouth every 8 (eight) hours as needed for nausea or vomiting. (Patient not taking: Reported on 06/26/2023) 40 tablet 0   terbinafine (LAMISIL) 250 MG tablet Take 1 tablet (250 mg total) by mouth daily. (Patient not taking: Reported on 05/23/2023) 60 tablet 0   Current Facility-Administered Medications  Medication Dose Route Frequency Provider Last Rate Last Admin   0.9 %  sodium chloride infusion  500 mL Intravenous Once Napoleon Form, MD        Allergies as of 06/26/2023 - Review Complete 06/26/2023  Allergen Reaction Noted   Aspirin Rash     Family History  Problem Relation Age of Onset   Cancer Mother        lung   Lung cancer Mother    Heart disease Maternal Grandmother    Heart disease Maternal Grandfather    Cancer Maternal Grandfather         colon, prostate,lung   Colon cancer Neg Hx    Colon polyps Neg Hx    Esophageal cancer Neg Hx    Rectal cancer Neg Hx    Stomach cancer Neg Hx     Social History   Socioeconomic History   Marital status: Married    Spouse name: Not on file   Number of children: Not on file   Years of education: Not on file   Highest education level: Not on file  Occupational History   Occupation: retired  Tobacco Use   Smoking status: Former    Current packs/day: 0.00    Average packs/day: 0.5 packs/day for 22.0 years (11.0 ttl pk-yrs)    Types: Cigarettes    Start date: 06/27/1966    Quit date: 06/27/1988    Years since quitting: 35.0   Smokeless tobacco: Never  Vaping Use   Vaping status: Never Used  Substance and Sexual Activity   Alcohol use: Not Currently    Comment: cognac (has 2-3 drinks for 2-3 days a week)   Drug use: No   Sexual activity: Yes    Partners: Female  Other Topics Concern   Not on file  Social History Narrative   Not on file   Social Drivers of Health   Financial Resource Strain: Low Risk  (01/31/2022)   Overall Financial Resource Strain (CARDIA)    Difficulty of Paying Living Expenses: Not hard at all  Food Insecurity: Low Risk  (06/09/2023)   Received from [facility]   Hunger Vital Sign    Worried About Running Out of Food in the Last Year: Never true    Ran Out of Food in the Last Year: Never true  Transportation Needs: No Transportation Needs (06/09/2023)   Received from [facility]    In the past 12 months, has lack of reliable transportation kept you from medical appointments, meetings, work or from getting things needed for daily living? : No  Physical Activity: Sufficiently Active (01/31/2022)   Exercise Vital Sign  Days of Exercise per Week: 5 days    Minutes of Exercise per Session: 40 min  Stress: No Stress Concern Present (01/31/2022)   [employer] of Occupational Health - Occupational Stress Questionnaire    Feeling of  Stress : Not at all  Social Connections: Moderately Integrated (01/31/2022)   Social Connection and Isolation Panel [NHANES]    Frequency of Communication with Friends and Family: Three times a week    Frequency of Social Gatherings with Friends and Family: Three times a week    Attends Religious Services: More than 4 times per year    Active Member of Clubs or Organizations: No    Attends Banker Meetings: Never    Marital Status: Married  Catering manager Violence: Not At Risk (01/31/2022)   Humiliation, Afraid, Rape, and Kick questionnaire    Fear of Current or Ex-Partner: No    Emotionally Abused: No    Physically Abused: No    Sexually Abused: No    Review of Systems:  All other review of systems negative except as mentioned in the HPI.  Physical Exam: Vital signs in last 24 hours: BP 121/76   Pulse 88   Temp 98.7 F (37.1 C) (Skin)   Resp 13   Ht 5\' 7"  (1.702 m)   Wt 199 lb 3.2 oz (90.4 kg)   SpO2 99%   BMI 31.20 kg/m  General:   Alert, NAD Lungs:  Clear .   Heart:  Regular rate and rhythm Abdomen:  Soft, nontender and nondistended. Neuro/Psych:  Alert and cooperative. Normal mood and affect. A and O x 3  Reviewed labs, radiology imaging, old records and pertinent past GI work up  Patient is appropriate for planned procedure(s) and anesthesia in an ambulatory setting   K. Scherry Ran , MD [phone]

## 2023-06-26 NOTE — Patient Instructions (Signed)
Discharge instructions given. Handouts on polyps,Diverticulosis and Hemorrhoids. Resume previous medications. Avoid NSAIDS for the next 2 weeks, aspirin,aleeve, naproxen or non anti-inflammatory drugs. YOU HAD AN ENDOSCOPIC PROCEDURE TODAY AT THE [facility]:   Refer to the procedure report that was given to you for any specific questions about what was found during the examination.  If the procedure report does not answer your questions, please call your gastroenterologist to clarify.  If you requested that your care partner not be given the details of your procedure findings, then the procedure report has been included in a sealed envelope for you to review at your convenience later.  YOU SHOULD EXPECT: Some feelings of bloating in the abdomen. Passage of more gas than usual.  Walking can help get rid of the air that was put into your GI tract during the procedure and reduce the bloating. If you had a lower endoscopy (such as a colonoscopy or flexible sigmoidoscopy) you may notice spotting of blood in your stool or on the toilet paper. If you underwent a bowel prep for your procedure, you may not have a normal bowel movement for a few days.  Please Note:  You might notice some irritation and congestion in your nose or some drainage.  This is from the oxygen used during your procedure.  There is no need for concern and it should clear up in a day or so.  SYMPTOMS TO REPORT IMMEDIATELY:  Following lower endoscopy (colonoscopy or flexible sigmoidoscopy):  Excessive amounts of blood in the stool  Significant tenderness or worsening of abdominal pains  Swelling of the abdomen that is new, acute  Fever of 100F or higher   For urgent or emergent issues, a gastroenterologist can be reached at any hour by calling [phone]. Do not use MyChart messaging for urgent concerns.    DIET:  We do recommend a small meal at first, but then you may proceed to your regular diet.  Drink plenty  of fluids but you should avoid alcoholic beverages for 24 hours.  ACTIVITY:  You should plan to take it easy for the rest of today and you should NOT DRIVE or use heavy machinery until tomorrow (because of the sedation medicines used during the test).    FOLLOW UP: Our staff will call the number listed on your records the next business day following your procedure.  We will call around 7:15- 8:00 am to check on you and address any questions or concerns that you may have regarding the information given to you following your procedure. If we do not reach you, we will leave a message.     If any biopsies were taken you will be contacted by phone or by letter within the next 1-3 weeks.  Please call us at [phone] if you have not heard about the biopsies in 3 weeks.    SIGNATURES/CONFIDENTIALITY: You and/or your care partner have signed paperwork which will be entered into your electronic medical record.  These signatures attest to the fact that that the information above on your After Visit Summary has been reviewed and is understood.  Full responsibility of the confidentiality of this discharge information lies with you and/or your care-partner.

## 2023-06-26 NOTE — Progress Notes (Signed)
Pt's states no medical or surgical changes since previsit or office visit. VS assessed by D.T 

## 2023-06-27 ENCOUNTER — Telehealth: Payer: Self-pay

## 2023-06-27 NOTE — Telephone Encounter (Signed)
  Follow up Call-     06/26/2023    7:09 AM  Call back number  Post procedure Call Back phone  # [phone]  Permission to leave phone message Yes     Patient questions:  Do you have a fever, pain , or abdominal swelling? No. Pain Score  0 *  Have you tolerated food without any problems? Yes.    Have you been able to return to your normal activities? Yes.    Do you have any questions about your discharge instructions: Diet   No. Medications  No. Follow up visit  No.  Do you have questions or concerns about your Care? No.  Actions: * If pain score is 4 or above: No action needed, pain <4.

## 2023-06-30 LAB — SURGICAL PATHOLOGY

## 2023-07-25 ENCOUNTER — Encounter: Payer: Self-pay | Admitting: Gastroenterology

## 2023-09-08 ENCOUNTER — Ambulatory Visit (INDEPENDENT_AMBULATORY_CARE_PROVIDER_SITE_OTHER): Admitting: Orthopaedic Surgery

## 2023-09-08 ENCOUNTER — Other Ambulatory Visit (INDEPENDENT_AMBULATORY_CARE_PROVIDER_SITE_OTHER): Payer: Self-pay

## 2023-09-08 ENCOUNTER — Encounter: Payer: Self-pay | Admitting: Orthopaedic Surgery

## 2023-09-08 DIAGNOSIS — M545 Low back pain, unspecified: Secondary | ICD-10-CM

## 2023-09-08 DIAGNOSIS — Z96651 Presence of right artificial knee joint: Secondary | ICD-10-CM | POA: Diagnosis not present

## 2023-09-08 NOTE — Progress Notes (Signed)
 Office Visit Note   Patient: Douglas Carpenter           Date of Birth: 1956-04-14           MRN: [id number] Visit Date: 09/08/2023              Requested by: Corliss Blacker, MD [address] [place],  [place] [address] PCP: Corliss Blacker, MD   Assessment & Plan: Visit Diagnoses:  1. Status post total right knee replacement     Plan: Kordel is a 68 year old gentleman who is about 7 months postop from a right total knee.  He is experiencing giving way.  I think this could be due to quad weakness from the surgery but he also lost over 20 pounds recently which could have contributed to loss and muscle mass.  He is also reporting chronic back problems and numbness in the thigh which is concerning for lumbar radiculopathy.  He had an MRI 10 years ago so we will need to repeat this today to evaluate for any significant stenosis.  We will contact him after the MRI to discuss treatment options.  He is currently in a pain clinic at [facility] and gets epidural steroid injections every 3 months.  Right knee exam is benign.  Implant looks good on x-rays.  Follow-Up Instructions: No follow-ups on file.   Orders:  Orders Placed This Encounter  Procedures   XR Knee 1-2 Views Right   No orders of the defined types were placed in this encounter.     Procedures: No procedures performed   Clinical Data: No additional findings.   Subjective: Chief Complaint  Patient presents with   Right Knee - Pain    Right total knee arthroplasty 02/20/2023    HPI Douglas Carpenter comes in today for evaluation of right knee pain.  His knee gave out yesterday and fell down.  He has numbness to the lateral thigh. Review of Systems  Constitutional: Negative.   HENT: Negative.    Eyes: Negative.   Respiratory: Negative.    Cardiovascular: Negative.   Gastrointestinal: Negative.   Endocrine: Negative.   Genitourinary: Negative.   Skin: Negative.   Allergic/Immunologic: Negative.   Neurological:  Negative.   Hematological: Negative.   Psychiatric/Behavioral: Negative.    All other systems reviewed and are negative.    Objective: Vital Signs: There were no vitals taken for this visit.  Physical Exam Vitals and nursing note reviewed.  Constitutional:      Appearance: He is well-developed.  Pulmonary:     Effort: Pulmonary effort is normal.  Abdominal:     Palpations: Abdomen is soft.  Skin:    General: Skin is warm.  Neurological:     Mental Status: He is alert and oriented to person, place, and time.  Psychiatric:        Behavior: Behavior normal.        Thought Content: Thought content normal.        Judgment: Judgment normal.     Ortho Exam Exam of the right leg shows mild quad atrophy.  Surgical scar is fully healed.  Extensor function intact.  Collaterals intact.  No bruising or swelling. Specialty Comments:  No specialty comments available.  Imaging: XR Knee 1-2 Views Right Result Date: 09/08/2023 X-rays of the right knee demonstrate a stable right total knee replacement in good alignment     PMFS History: Patient Active Problem List   Diagnosis Date Noted   Status post total  right knee replacement 02/20/2023   Pain in left ankle and joints of left foot 07/20/2022   Primary osteoarthritis of first carpometacarpal joint of right hand 07/20/2022   Right carpal tunnel syndrome 11/11/2021   Acute asthmatic bronchitis 06/14/2021   Other headache syndrome 05/17/2021   Body aches 05/17/2021   Acute cough 05/17/2021   Shortness of breath 05/17/2021   Influenza A 05/17/2021   Arthritis of carpometacarpal (CMC) joint of right thumb 04/05/2021   Bilateral hand numbness 04/05/2021   Type II diabetes mellitus with manifestations (HCC) 03/18/2021   Diuretic-induced hypokalemia 10/22/2020   Encounter for general adult medical examination with abnormal findings 08/05/2020   Rising PSA level 08/04/2020   Stage 3a chronic kidney disease (HCC) 08/04/2020    Prediabetes 08/03/2020   Primary hypertension 08/03/2020   Benign prostatic hyperplasia without lower urinary tract symptoms 08/03/2020   Hyperlipidemia with target LDL less than 130 08/03/2020   Primary osteoarthritis of left knee 04/16/2019   Primary osteoarthritis of right knee 02/20/2019   Exercise-induced asthma 07/21/2014   Chronic cough 07/21/2014   Upper airway cough syndrome 07/21/2014   Allergic rhinitis 07/21/2014   Lumbar stenosis 11/06/2013   Urethral stricture 01/07/2013   VOCAL CORD DISORDER 02/19/2009   Asthma 01/27/2009   GERD 01/27/2009   Past Medical History:  Diagnosis Date   Allergy    Arthritis    "everywhere"   BPH (benign prostatic hypertrophy)    Chronic rhinitis 01/27/2009   ED (erectile dysfunction)    GERD (gastroesophageal reflux disease)    Headache(784.0)    migraines, as many as 3 in a week     HYPERLIPIDEMIA 01/27/2009   HYPERTENSION 01/27/2009   Mild asthma    seasonal allergies, uses Proair once per yr.    Mild obstructive sleep apnea    per pt -- mild osa test result--  no rx cpap needed   Neuromuscular disorder (HCC)    Sleep apnea    no cpap   SOB (shortness of breath)    Testosterone deficiency    Urethral stricture    Vocal cord anomaly    pt states told from [place]  doctor heard a little gurgle sound -- no farther testing done    Family History  Problem Relation Age of Onset   Cancer Mother        lung   Lung cancer Mother    Heart disease Maternal Grandmother    Heart disease Maternal Grandfather    Cancer Maternal Grandfather        colon, prostate,lung   Colon cancer Neg Hx    Colon polyps Neg Hx    Esophageal cancer Neg Hx    Rectal cancer Neg Hx    Stomach cancer Neg Hx     Past Surgical History:  Procedure Laterality Date   BACK SURGERY     CARPAL TUNNEL RELEASE Bilateral    Dr Roda Shutters   COLONOSCOPY     CYSTOSCOPY WITH URETHRAL DILATATION N/A 01/07/2013   Procedure: CYSTOSCOPY WITH URETHRAL DILATATION BALLOON  DILATION ;  Surgeon: Valetta Fuller, MD;  Location: [facility];  Service: Urology;  Laterality: N/A;   LUMBAR LAMINECTOMY/DECOMPRESSION MICRODISCECTOMY Right 11/29/2012   Procedure: Right Lumbar Four to Five Laminectomy/ Decompression Microdiskectomy;  Surgeon: Clydene Fake, MD;  Location: [facility];  Service: Neurosurgery;  Laterality: Right;  LUMBAR LAMINECTOMY/DECOMPRESSION MICRODISCECTOMY 1 LEVEL   POLYPECTOMY     SHOULDER ARTHROSCOPY W/ ACROMIAL REPAIR Left 08/28/2002   TOTAL  KNEE ARTHROPLASTY Left 12/02/2019   Procedure: LEFT TOTAL KNEE ARTHROPLASTY;  Surgeon: Tarry Kos, MD;  Location: [facility];  Service: Orthopedics;  Laterality: Left;   TOTAL KNEE ARTHROPLASTY Right 02/20/2023   Procedure: RIGHT TOTAL KNEE ARTHROPLASTY;  Surgeon: Tarry Kos, MD;  Location: [facility];  Service: Orthopedics;  Laterality: Right;   UPPER GASTROINTESTINAL ENDOSCOPY     Social History   Occupational History   Occupation: retired  Tobacco Use   Smoking status: Former    Current packs/day: 0.00    Average packs/day: 0.5 packs/day for 22.0 years (11.0 ttl pk-yrs)    Types: Cigarettes    Start date: 06/27/1966    Quit date: 06/27/1988    Years since quitting: 35.2   Smokeless tobacco: Never  Vaping Use   Vaping status: Never Used  Substance and Sexual Activity   Alcohol use: Not Currently    Comment: cognac (has 2-3 drinks for 2-3 days a week)   Drug use: No   Sexual activity: Yes    Partners: Female

## 2023-10-12 ENCOUNTER — Ambulatory Visit
Admission: RE | Admit: 2023-10-12 | Discharge: 2023-10-12 | Disposition: A | Discharge status: Home / Self Care | Source: Ambulatory Visit | Attending: Orthopaedic Surgery | Admitting: Orthopaedic Surgery

## 2023-10-12 DIAGNOSIS — M545 Low back pain, unspecified: Secondary | ICD-10-CM

## 2023-10-25 ENCOUNTER — Ambulatory Visit: Admitting: Orthopaedic Surgery

## 2023-10-27 ENCOUNTER — Encounter: Payer: Self-pay | Admitting: Orthopaedic Surgery

## 2023-10-27 ENCOUNTER — Ambulatory Visit (INDEPENDENT_AMBULATORY_CARE_PROVIDER_SITE_OTHER): Admitting: Orthopaedic Surgery

## 2023-10-27 DIAGNOSIS — M545 Low back pain, unspecified: Secondary | ICD-10-CM

## 2023-10-27 NOTE — Progress Notes (Signed)
 Office Visit Note   Patient: Douglas Carpenter           Date of Birth: [birth year]           MRN: [id number] Visit Date: 10/27/2023              Requested by: Olene Berne, MD [address] [place],  [place] [address] PCP: Olene Berne, MD   Assessment & Plan: Visit Diagnoses:  1. Low back pain, unspecified back pain laterality, unspecified chronicity, unspecified whether sciatica present     History of Present Illness Douglas Carpenter is a 68 year old male with a history of lumbar fusion surgery who presents with right thigh numbness.  He experiences persistent numbness in the right lateral thigh. He underwent lumbar fusion surgery performed by Dr. Cipriano Creeks who is now retired. He receives regular injections every three months at [facility] by Dr. Crecencio Dodge, but the numbness continues. He has been advised to lose weight to alleviate his pain, but symptoms persist despite these efforts. No additional symptoms are present.  Physical Exam Physical exam is unchanged from prior visit.  Numbness to the lateral right thigh.  Results RADIOLOGY Lumbar spine MRI: L2-L3 level involvement  Assessment and Plan Lumbosacral radiculopathy Chronic numbness in right lateral thigh due to L2-L3 nerve root involvement. MRI indicates persistent symptoms despite regular injections. Further evaluation by spine specialist recommended. - Refer to Dr. Crecencio Dodge to discuss MRI findings and evaluate current injection therapy. - Consider spine surgeon referral if current treatment ineffective. - Offer referral to Dr. Sulema Endo for second opinion if desired. - will think about options  Follow-Up Instructions: No follow-ups on file.   Orders:  No orders of the defined types were placed in this encounter.  No orders of the defined types were placed in this encounter.     Procedures: No procedures performed   Clinical Data: No additional findings.   Subjective: Chief Complaint  Patient  presents with   Lower Back - Follow-up    MRI review      Review of Systems  Constitutional: Negative.   HENT: Negative.    Eyes: Negative.   Respiratory: Negative.    Cardiovascular: Negative.   Gastrointestinal: Negative.   Endocrine: Negative.   Genitourinary: Negative.   Skin: Negative.   Allergic/Immunologic: Negative.   Neurological: Negative.   Hematological: Negative.   Psychiatric/Behavioral: Negative.    All other systems reviewed and are negative.    Objective: Vital Signs: There were no vitals taken for this visit.  Physical Exam Vitals and nursing note reviewed.  Constitutional:      Appearance: He is well-developed.  Pulmonary:     Effort: Pulmonary effort is normal.  Abdominal:     Palpations: Abdomen is soft.  Skin:    General: Skin is warm.  Neurological:     Mental Status: He is alert and oriented to person, place, and time.  Psychiatric:        Behavior: Behavior normal.        Thought Content: Thought content normal.        Judgment: Judgment normal.      PMFS History: Patient Active Problem List   Diagnosis Date Noted   Status post total right knee replacement 02/20/2023   Pain in left ankle and joints of left foot 07/20/2022   Primary osteoarthritis of first carpometacarpal joint of right hand 07/20/2022   Right carpal tunnel syndrome 11/11/2021   Acute asthmatic bronchitis 06/14/2021  Other headache syndrome 05/17/2021   Body aches 05/17/2021   Acute cough 05/17/2021   Shortness of breath 05/17/2021   Influenza A 05/17/2021   Arthritis of carpometacarpal (CMC) joint of right thumb 04/05/2021   Bilateral hand numbness 04/05/2021   Type II diabetes mellitus with manifestations (HCC) 03/18/2021   Diuretic-induced hypokalemia 10/22/2020   Encounter for general adult medical examination with abnormal findings 08/05/2020   Rising PSA level 08/04/2020   Stage 3a chronic kidney disease (HCC) 08/04/2020   Prediabetes 08/03/2020    Primary hypertension 08/03/2020   Benign prostatic hyperplasia without lower urinary tract symptoms 08/03/2020   Hyperlipidemia with target LDL less than 130 08/03/2020   Primary osteoarthritis of left knee 04/16/2019   Primary osteoarthritis of right knee 02/20/2019   Exercise-induced asthma 07/21/2014   Chronic cough 07/21/2014   Upper airway cough syndrome 07/21/2014   Allergic rhinitis 07/21/2014   Lumbar stenosis 11/06/2013   Urethral stricture 01/07/2013   VOCAL CORD DISORDER 02/19/2009   Asthma 01/27/2009   GERD 01/27/2009   Past Medical History:  Diagnosis Date   Allergy     Arthritis    "everywhere"   BPH (benign prostatic hypertrophy)    Chronic rhinitis 01/27/2009   ED (erectile dysfunction)    GERD (gastroesophageal reflux disease)    Headache(784.0)    migraines, as many as 3 in a week     HYPERLIPIDEMIA 01/27/2009   HYPERTENSION 01/27/2009   Mild asthma    seasonal allergies, uses Proair  once per yr.    Mild obstructive sleep apnea    per pt -- mild osa test result--  no rx cpap needed   Neuromuscular disorder (HCC)    Sleep apnea    no cpap   SOB (shortness of breath)    Testosterone deficiency    Urethral stricture    Vocal cord anomaly    pt states told from [place]  doctor heard a little gurgle sound -- no farther testing done    Family History  Problem Relation Age of Onset   Cancer Mother        lung   Lung cancer Mother    Heart disease Maternal Grandmother    Heart disease Maternal Grandfather    Cancer Maternal Grandfather        colon, prostate,lung   Colon cancer Neg Hx    Colon polyps Neg Hx    Esophageal cancer Neg Hx    Rectal cancer Neg Hx    Stomach cancer Neg Hx     Past Surgical History:  Procedure Laterality Date   BACK SURGERY     CARPAL TUNNEL RELEASE Bilateral    Dr Christiane Cowing   COLONOSCOPY     CYSTOSCOPY WITH URETHRAL DILATATION N/A 01/07/2013   Procedure: CYSTOSCOPY WITH URETHRAL DILATATION BALLOON DILATION ;  Surgeon: Livingston Rigg, MD;  Location: [facility];  Service: Urology;  Laterality: N/A;   LUMBAR LAMINECTOMY/DECOMPRESSION MICRODISCECTOMY Right 11/29/2012   Procedure: Right Lumbar Four to Five Laminectomy/ Decompression Microdiskectomy;  Surgeon: Shary Deems, MD;  Location: [facility];  Service: Neurosurgery;  Laterality: Right;  LUMBAR LAMINECTOMY/DECOMPRESSION MICRODISCECTOMY 1 LEVEL   POLYPECTOMY     SHOULDER ARTHROSCOPY W/ ACROMIAL REPAIR Left 08/28/2002   TOTAL KNEE ARTHROPLASTY Left 12/02/2019   Procedure: LEFT TOTAL KNEE ARTHROPLASTY;  Surgeon: Wes Hamman, MD;  Location: [facility];  Service: Orthopedics;  Laterality: Left;   TOTAL KNEE ARTHROPLASTY Right 02/20/2023   Procedure: RIGHT TOTAL KNEE ARTHROPLASTY;  Surgeon:  Wes Hamman, MD;  Location: [facility];  Service: Orthopedics;  Laterality: Right;   UPPER GASTROINTESTINAL ENDOSCOPY     Social History   Occupational History   Occupation: retired  Tobacco Use   Smoking status: Former    Current packs/day: 0.00    Average packs/day: 0.5 packs/day for 22.0 years (11.0 ttl pk-yrs)    Types: Cigarettes    Start date: 06/27/1966    Quit date: 06/27/1988    Years since quitting: 35.3   Smokeless tobacco: Never  Vaping Use   Vaping status: Never Used  Substance and Sexual Activity   Alcohol use: Not Currently    Comment: cognac (has 2-3 drinks for 2-3 days a week)   Drug use: No   Sexual activity: Yes    Partners: Female

## 2024-07-04 ENCOUNTER — Ambulatory Visit: Admitting: Podiatry

## 2024-07-05 ENCOUNTER — Ambulatory Visit: Admitting: Podiatry

## 2024-07-05 ENCOUNTER — Encounter: Payer: Self-pay | Admitting: Podiatry

## 2024-07-05 ENCOUNTER — Ambulatory Visit

## 2024-07-05 DIAGNOSIS — M205X1 Other deformities of toe(s) (acquired), right foot: Secondary | ICD-10-CM

## 2024-07-05 DIAGNOSIS — M7750 Other enthesopathy of unspecified foot: Secondary | ICD-10-CM

## 2024-07-05 DIAGNOSIS — M7751 Other enthesopathy of right foot: Secondary | ICD-10-CM | POA: Diagnosis not present

## 2024-07-05 MED ORDER — TRIAMCINOLONE ACETONIDE 10 MG/ML IJ SUSP
10.0000 mg | Freq: Once | INTRAMUSCULAR | Status: AC
  Administered 2024-07-05: 10 mg via INTRA_ARTICULAR

## 2024-07-08 NOTE — Progress Notes (Signed)
 Subjective:   Patient ID: Douglas Carpenter, male   DOB: 69 y.o.   MRN: [id number]   HPI Patient states he has a lot of pain in the big toe joint right and states that it is gradually become more of an issue over time.  Patient try shoe gear modifications and other treatments    ROS      Objective:  Physical Exam  Neurovascular status intact with inflammation fluid around the first MPJ right painful when pressed inability to walk normally when he is active     Assessment:  Inflammatory capsulitis first MPJ right bone spur formation probability for hallux limitus deformity     Plan:  H&P reviewed with him and wife x-ray explaining arthritis to the joint bone spur formation narrowing and today organ to try to treat conservatively and I did periarticular injection around the first MPJ 3 mg dexamethasone  Kenalog  5 mg Xylocaine  applied sterile dressing advised on rigid shoes and will be seen back as symptoms indicate  X-rays indicate that there is spurring in the first metatarsal head right narrowing of the joint surface was noted with probable cartilage damage of a moderate nature
